# Patient Record
Sex: Female | Born: 1935 | Race: White | Hispanic: No | Marital: Married | Smoking: Never smoker
Health system: Southern US, Community
[De-identification: ages and names within clinical notes are randomized; demographics above are authoritative.]

## PROBLEM LIST (undated history)

## (undated) DIAGNOSIS — I2510 Atherosclerotic heart disease of native coronary artery without angina pectoris: Secondary | ICD-10-CM

## (undated) DIAGNOSIS — M1990 Unspecified osteoarthritis, unspecified site: Secondary | ICD-10-CM

## (undated) DIAGNOSIS — R7309 Other abnormal glucose: Secondary | ICD-10-CM

## (undated) DIAGNOSIS — I071 Rheumatic tricuspid insufficiency: Secondary | ICD-10-CM

## (undated) DIAGNOSIS — E785 Hyperlipidemia, unspecified: Secondary | ICD-10-CM

## (undated) DIAGNOSIS — E039 Hypothyroidism, unspecified: Secondary | ICD-10-CM

## (undated) DIAGNOSIS — I10 Essential (primary) hypertension: Secondary | ICD-10-CM

## (undated) DIAGNOSIS — D649 Anemia, unspecified: Secondary | ICD-10-CM

## (undated) DIAGNOSIS — I340 Nonrheumatic mitral (valve) insufficiency: Secondary | ICD-10-CM

## (undated) DIAGNOSIS — I509 Heart failure, unspecified: Secondary | ICD-10-CM

## (undated) DIAGNOSIS — I219 Acute myocardial infarction, unspecified: Secondary | ICD-10-CM

## (undated) DIAGNOSIS — I639 Cerebral infarction, unspecified: Secondary | ICD-10-CM

## (undated) DIAGNOSIS — N189 Chronic kidney disease, unspecified: Secondary | ICD-10-CM

## (undated) HISTORY — DX: Other abnormal glucose: R73.09

## (undated) HISTORY — PX: BACK SURGERY: SHX140

## (undated) HISTORY — DX: Hyperlipidemia, unspecified: E78.5

## (undated) HISTORY — PX: CORONARY STENT PLACEMENT: SHX1402

## (undated) HISTORY — DX: Rheumatic tricuspid insufficiency: I07.1

## (undated) HISTORY — PX: ABDOMINAL HYSTERECTOMY: SHX81

## (undated) HISTORY — DX: Hypothyroidism, unspecified: E03.9

## (undated) HISTORY — DX: Unspecified osteoarthritis, unspecified site: M19.90

## (undated) HISTORY — DX: Cerebral infarction, unspecified: I63.9

## (undated) HISTORY — DX: Chronic kidney disease, unspecified: N18.9

## (undated) HISTORY — DX: Anemia, unspecified: D64.9

## (undated) HISTORY — DX: Nonrheumatic mitral (valve) insufficiency: I34.0

## (undated) HISTORY — DX: Heart failure, unspecified: I50.9

## (undated) HISTORY — DX: Atherosclerotic heart disease of native coronary artery without angina pectoris: I25.10

## (undated) HISTORY — DX: Essential (primary) hypertension: I10

---

## 1997-08-31 ENCOUNTER — Other Ambulatory Visit: Admission: RE | Admit: 1997-08-31 | Discharge: 1997-08-31 | Payer: Self-pay | Admitting: Obstetrics and Gynecology

## 1998-12-03 ENCOUNTER — Other Ambulatory Visit: Admission: RE | Admit: 1998-12-03 | Discharge: 1998-12-03 | Payer: Self-pay | Admitting: Obstetrics and Gynecology

## 1999-07-29 ENCOUNTER — Ambulatory Visit (HOSPITAL_COMMUNITY): Admission: RE | Admit: 1999-07-29 | Discharge: 1999-07-29 | Payer: Self-pay | Admitting: Internal Medicine

## 1999-07-29 ENCOUNTER — Encounter: Payer: Self-pay | Admitting: Internal Medicine

## 1999-10-07 ENCOUNTER — Encounter: Payer: Self-pay | Admitting: Obstetrics and Gynecology

## 1999-10-07 ENCOUNTER — Encounter: Admission: RE | Admit: 1999-10-07 | Discharge: 1999-10-07 | Payer: Self-pay | Admitting: Obstetrics and Gynecology

## 1999-12-09 ENCOUNTER — Other Ambulatory Visit: Admission: RE | Admit: 1999-12-09 | Discharge: 1999-12-09 | Payer: Self-pay | Admitting: Obstetrics and Gynecology

## 2000-03-23 ENCOUNTER — Other Ambulatory Visit: Admission: RE | Admit: 2000-03-23 | Discharge: 2000-03-23 | Payer: Self-pay | Admitting: Gastroenterology

## 2000-11-05 ENCOUNTER — Encounter: Payer: Self-pay | Admitting: Obstetrics and Gynecology

## 2000-11-05 ENCOUNTER — Encounter: Admission: RE | Admit: 2000-11-05 | Discharge: 2000-11-05 | Payer: Self-pay | Admitting: Obstetrics and Gynecology

## 2000-12-14 ENCOUNTER — Other Ambulatory Visit: Admission: RE | Admit: 2000-12-14 | Discharge: 2000-12-14 | Payer: Self-pay | Admitting: Obstetrics and Gynecology

## 2001-11-11 ENCOUNTER — Encounter: Payer: Self-pay | Admitting: Internal Medicine

## 2001-11-11 ENCOUNTER — Encounter: Admission: RE | Admit: 2001-11-11 | Discharge: 2001-11-11 | Payer: Self-pay | Admitting: Internal Medicine

## 2001-11-20 ENCOUNTER — Encounter: Payer: Self-pay | Admitting: Neurological Surgery

## 2001-11-20 ENCOUNTER — Ambulatory Visit (HOSPITAL_COMMUNITY): Admission: RE | Admit: 2001-11-20 | Discharge: 2001-11-20 | Payer: Self-pay | Admitting: Neurological Surgery

## 2001-12-07 ENCOUNTER — Encounter: Payer: Self-pay | Admitting: Neurological Surgery

## 2001-12-09 ENCOUNTER — Encounter: Payer: Self-pay | Admitting: Neurological Surgery

## 2001-12-09 ENCOUNTER — Inpatient Hospital Stay (HOSPITAL_COMMUNITY): Admission: RE | Admit: 2001-12-09 | Discharge: 2001-12-14 | Payer: Self-pay | Admitting: Neurological Surgery

## 2002-12-12 ENCOUNTER — Encounter: Admission: RE | Admit: 2002-12-12 | Discharge: 2002-12-12 | Payer: Self-pay | Admitting: Internal Medicine

## 2002-12-12 ENCOUNTER — Encounter: Payer: Self-pay | Admitting: Internal Medicine

## 2003-12-13 ENCOUNTER — Encounter: Admission: RE | Admit: 2003-12-13 | Discharge: 2003-12-13 | Payer: Self-pay | Admitting: Internal Medicine

## 2004-01-29 ENCOUNTER — Ambulatory Visit: Payer: Self-pay | Admitting: Internal Medicine

## 2004-02-01 ENCOUNTER — Ambulatory Visit: Payer: Self-pay | Admitting: Internal Medicine

## 2004-03-05 ENCOUNTER — Inpatient Hospital Stay (HOSPITAL_COMMUNITY): Admission: RE | Admit: 2004-03-05 | Discharge: 2004-03-09 | Payer: Self-pay | Admitting: Internal Medicine

## 2004-03-05 ENCOUNTER — Ambulatory Visit: Payer: Self-pay | Admitting: Internal Medicine

## 2004-03-05 ENCOUNTER — Ambulatory Visit: Payer: Self-pay | Admitting: Cardiovascular Disease

## 2004-03-22 ENCOUNTER — Ambulatory Visit: Payer: Self-pay | Admitting: Cardiovascular Disease

## 2004-04-15 ENCOUNTER — Ambulatory Visit: Payer: Self-pay | Admitting: Internal Medicine

## 2004-04-17 ENCOUNTER — Ambulatory Visit: Payer: Self-pay | Admitting: Internal Medicine

## 2004-05-22 ENCOUNTER — Ambulatory Visit: Payer: Self-pay

## 2004-05-22 ENCOUNTER — Ambulatory Visit: Payer: Self-pay | Admitting: Cardiovascular Disease

## 2004-06-10 ENCOUNTER — Ambulatory Visit: Payer: Self-pay | Admitting: Internal Medicine

## 2004-06-17 ENCOUNTER — Ambulatory Visit: Payer: Self-pay | Admitting: Internal Medicine

## 2004-09-30 ENCOUNTER — Ambulatory Visit: Payer: Self-pay | Admitting: Internal Medicine

## 2004-10-03 ENCOUNTER — Ambulatory Visit: Payer: Self-pay | Admitting: Internal Medicine

## 2004-11-26 ENCOUNTER — Ambulatory Visit: Payer: Self-pay | Admitting: Cardiovascular Disease

## 2004-12-23 ENCOUNTER — Encounter: Admission: RE | Admit: 2004-12-23 | Discharge: 2004-12-23 | Payer: Self-pay | Admitting: Internal Medicine

## 2005-01-02 ENCOUNTER — Ambulatory Visit: Payer: Self-pay | Admitting: Internal Medicine

## 2005-01-24 ENCOUNTER — Ambulatory Visit: Payer: Self-pay | Admitting: Internal Medicine

## 2005-01-27 ENCOUNTER — Ambulatory Visit: Payer: Self-pay | Admitting: Internal Medicine

## 2005-04-24 ENCOUNTER — Ambulatory Visit: Payer: Self-pay | Admitting: Internal Medicine

## 2005-04-29 ENCOUNTER — Ambulatory Visit: Payer: Self-pay | Admitting: Internal Medicine

## 2005-05-28 ENCOUNTER — Ambulatory Visit: Payer: Self-pay | Admitting: Cardiovascular Disease

## 2005-07-21 ENCOUNTER — Ambulatory Visit: Payer: Self-pay | Admitting: Internal Medicine

## 2005-07-23 ENCOUNTER — Ambulatory Visit: Payer: Self-pay | Admitting: Internal Medicine

## 2005-10-31 ENCOUNTER — Ambulatory Visit: Payer: Self-pay | Admitting: Internal Medicine

## 2005-11-04 ENCOUNTER — Ambulatory Visit: Payer: Self-pay | Admitting: Internal Medicine

## 2005-11-18 ENCOUNTER — Ambulatory Visit: Payer: Self-pay | Admitting: Cardiovascular Disease

## 2005-12-02 ENCOUNTER — Ambulatory Visit: Payer: Self-pay

## 2005-12-30 ENCOUNTER — Ambulatory Visit: Payer: Self-pay | Admitting: Cardiovascular Disease

## 2006-01-15 ENCOUNTER — Encounter: Admission: RE | Admit: 2006-01-15 | Discharge: 2006-01-15 | Payer: Self-pay | Admitting: Internal Medicine

## 2006-01-27 ENCOUNTER — Encounter: Admission: RE | Admit: 2006-01-27 | Discharge: 2006-01-27 | Payer: Self-pay | Admitting: Internal Medicine

## 2006-01-30 ENCOUNTER — Ambulatory Visit: Payer: Self-pay | Admitting: Internal Medicine

## 2006-01-30 LAB — CONVERTED CEMR LAB
Creatinine, Ser: 1.6 mg/dL — ABNORMAL HIGH (ref 0.4–1.2)
Glucose, Bld: 100 mg/dL — ABNORMAL HIGH (ref 70–99)

## 2006-02-12 ENCOUNTER — Ambulatory Visit: Payer: Self-pay | Admitting: Internal Medicine

## 2006-03-09 ENCOUNTER — Ambulatory Visit: Payer: Self-pay | Admitting: Internal Medicine

## 2006-04-22 ENCOUNTER — Ambulatory Visit: Payer: Self-pay | Admitting: Internal Medicine

## 2006-05-11 ENCOUNTER — Ambulatory Visit: Payer: Self-pay | Admitting: Gastroenterology

## 2006-05-12 ENCOUNTER — Ambulatory Visit: Payer: Self-pay | Admitting: Internal Medicine

## 2006-05-12 LAB — CONVERTED CEMR LAB
AST: 26 units/L (ref 0–37)
BUN: 26 mg/dL — ABNORMAL HIGH (ref 6–23)
Cholesterol: 190 mg/dL (ref 0–200)
Hgb A1c MFr Bld: 6.1 % — ABNORMAL HIGH (ref 4.6–6.0)
Potassium: 4.2 meq/L (ref 3.5–5.1)
Sodium: 143 meq/L (ref 135–145)
Total CHOL/HDL Ratio: 3.9
VLDL: 45 mg/dL — ABNORMAL HIGH (ref 0–40)

## 2006-05-14 ENCOUNTER — Ambulatory Visit: Payer: Self-pay | Admitting: Internal Medicine

## 2006-05-27 ENCOUNTER — Ambulatory Visit: Payer: Self-pay | Admitting: Gastroenterology

## 2006-05-27 ENCOUNTER — Encounter: Payer: Self-pay | Admitting: Gastroenterology

## 2006-08-03 ENCOUNTER — Ambulatory Visit: Payer: Self-pay | Admitting: Cardiovascular Disease

## 2006-08-17 ENCOUNTER — Ambulatory Visit: Payer: Self-pay | Admitting: Internal Medicine

## 2006-08-17 LAB — CONVERTED CEMR LAB
BUN: 32 mg/dL — ABNORMAL HIGH (ref 6–23)
CO2: 26 meq/L (ref 19–32)
Calcium: 9.4 mg/dL (ref 8.4–10.5)
Chloride: 112 meq/L (ref 96–112)
GFR calc Af Amer: 51 mL/min
GFR calc non Af Amer: 42 mL/min
H Pylori IgG: NEGATIVE
Microalb Creat Ratio: 13 mg/g (ref 0.0–30.0)
Microalb, Ur: 1.3 mg/dL (ref 0.0–1.9)

## 2006-08-18 ENCOUNTER — Ambulatory Visit: Payer: Self-pay | Admitting: Internal Medicine

## 2006-08-18 LAB — CONVERTED CEMR LAB
Calcium, Total (PTH): 9.5 mg/dL (ref 8.4–10.5)
PTH: 79.8 pg/mL — ABNORMAL HIGH (ref 14.0–72.0)
Vit D, 1,25-Dihydroxy: 19 — ABNORMAL LOW (ref 20–57)

## 2006-12-02 ENCOUNTER — Ambulatory Visit: Payer: Self-pay

## 2006-12-21 ENCOUNTER — Ambulatory Visit: Payer: Self-pay | Admitting: Internal Medicine

## 2006-12-21 LAB — CONVERTED CEMR LAB
AST: 24 units/L (ref 0–37)
BUN: 25 mg/dL — ABNORMAL HIGH (ref 6–23)
Calcium: 9.4 mg/dL (ref 8.4–10.5)
Chloride: 113 meq/L — ABNORMAL HIGH (ref 96–112)
Direct LDL: 161.3 mg/dL
GFR calc Af Amer: 56 mL/min
Hgb A1c MFr Bld: 5.8 % (ref 4.6–6.0)
Total Bilirubin: 0.7 mg/dL (ref 0.3–1.2)
Total CHOL/HDL Ratio: 6.4
Total Protein: 6.6 g/dL (ref 6.0–8.3)
Triglycerides: 259 mg/dL (ref 0–149)
VLDL: 52 mg/dL — ABNORMAL HIGH (ref 0–40)

## 2006-12-23 ENCOUNTER — Ambulatory Visit: Payer: Self-pay | Admitting: Internal Medicine

## 2006-12-23 DIAGNOSIS — E039 Hypothyroidism, unspecified: Secondary | ICD-10-CM

## 2006-12-28 ENCOUNTER — Encounter: Payer: Self-pay | Admitting: Internal Medicine

## 2006-12-28 DIAGNOSIS — M1990 Unspecified osteoarthritis, unspecified site: Secondary | ICD-10-CM | POA: Insufficient documentation

## 2006-12-28 DIAGNOSIS — E785 Hyperlipidemia, unspecified: Secondary | ICD-10-CM

## 2006-12-28 DIAGNOSIS — I10 Essential (primary) hypertension: Secondary | ICD-10-CM

## 2006-12-28 DIAGNOSIS — I2510 Atherosclerotic heart disease of native coronary artery without angina pectoris: Secondary | ICD-10-CM | POA: Insufficient documentation

## 2007-01-18 ENCOUNTER — Encounter: Admission: RE | Admit: 2007-01-18 | Discharge: 2007-01-18 | Payer: Self-pay | Admitting: Internal Medicine

## 2007-01-25 ENCOUNTER — Telehealth: Payer: Self-pay | Admitting: Internal Medicine

## 2007-02-03 ENCOUNTER — Ambulatory Visit: Payer: Self-pay | Admitting: Cardiovascular Disease

## 2007-02-24 ENCOUNTER — Ambulatory Visit: Payer: Self-pay

## 2007-04-01 ENCOUNTER — Ambulatory Visit: Payer: Self-pay | Admitting: Cardiovascular Disease

## 2007-04-12 ENCOUNTER — Ambulatory Visit: Payer: Self-pay | Admitting: Internal Medicine

## 2007-04-12 LAB — CONVERTED CEMR LAB
ALT: 16 units/L (ref 0–35)
Albumin: 3.5 g/dL (ref 3.5–5.2)
Alkaline Phosphatase: 79 units/L (ref 39–117)
BUN: 25 mg/dL — ABNORMAL HIGH (ref 6–23)
CO2: 25 meq/L (ref 19–32)
Calcium: 9.3 mg/dL (ref 8.4–10.5)
Creatinine, Ser: 1.2 mg/dL (ref 0.4–1.2)
GFR calc Af Amer: 56 mL/min
Total Bilirubin: 0.8 mg/dL (ref 0.3–1.2)
Total Protein: 6.8 g/dL (ref 6.0–8.3)
VLDL: 30 mg/dL (ref 0–40)

## 2007-04-14 ENCOUNTER — Ambulatory Visit: Payer: Self-pay | Admitting: Internal Medicine

## 2007-05-26 ENCOUNTER — Ambulatory Visit: Payer: Self-pay | Admitting: Internal Medicine

## 2007-05-26 DIAGNOSIS — R05 Cough: Secondary | ICD-10-CM

## 2007-06-08 ENCOUNTER — Ambulatory Visit: Payer: Self-pay | Admitting: Internal Medicine

## 2007-06-08 DIAGNOSIS — R252 Cramp and spasm: Secondary | ICD-10-CM

## 2007-08-09 ENCOUNTER — Ambulatory Visit: Payer: Self-pay | Admitting: Internal Medicine

## 2007-08-09 LAB — CONVERTED CEMR LAB
BUN: 25 mg/dL — ABNORMAL HIGH (ref 6–23)
CO2: 25 meq/L (ref 19–32)
Calcium: 9.3 mg/dL (ref 8.4–10.5)
Chloride: 107 meq/L (ref 96–112)
Cholesterol: 280 mg/dL (ref 0–200)
Creatinine, Ser: 1.2 mg/dL (ref 0.4–1.2)
Direct LDL: 178.4 mg/dL
GFR calc Af Amer: 56 mL/min
GFR calc non Af Amer: 46 mL/min
Glucose, Bld: 87 mg/dL (ref 70–99)
HDL: 37.2 mg/dL — ABNORMAL LOW (ref >39.0)
Potassium: 4 meq/L (ref 3.5–5.1)
Sodium: 141 meq/L (ref 135–145)
TSH: 1.16 microintl units/mL (ref 0.35–5.50)
Total CHOL/HDL Ratio: 7.5
Triglycerides: 322 mg/dL (ref 0–149)
VLDL: 64 mg/dL — ABNORMAL HIGH (ref 0–40)

## 2007-08-11 ENCOUNTER — Ambulatory Visit: Payer: Self-pay | Admitting: Internal Medicine

## 2007-08-11 ENCOUNTER — Telehealth: Payer: Self-pay | Admitting: Internal Medicine

## 2007-08-11 DIAGNOSIS — E559 Vitamin D deficiency, unspecified: Secondary | ICD-10-CM | POA: Insufficient documentation

## 2007-12-09 ENCOUNTER — Ambulatory Visit: Payer: Self-pay | Admitting: Internal Medicine

## 2007-12-09 LAB — CONVERTED CEMR LAB
CO2: 25 meq/L (ref 19–32)
Chloride: 106 meq/L (ref 96–112)
Direct LDL: 176.7 mg/dL
Glucose, Bld: 92 mg/dL (ref 70–99)
Potassium: 4.2 meq/L (ref 3.5–5.1)
Sodium: 139 meq/L (ref 135–145)
Total CHOL/HDL Ratio: 7.8
Triglycerides: 332 mg/dL (ref 0–149)
Vit D, 1,25-Dihydroxy: 37 (ref 30–89)

## 2007-12-13 ENCOUNTER — Ambulatory Visit: Payer: Self-pay | Admitting: Internal Medicine

## 2007-12-13 DIAGNOSIS — N259 Disorder resulting from impaired renal tubular function, unspecified: Secondary | ICD-10-CM | POA: Insufficient documentation

## 2008-01-19 ENCOUNTER — Encounter: Admission: RE | Admit: 2008-01-19 | Discharge: 2008-01-19 | Payer: Self-pay | Admitting: Internal Medicine

## 2008-03-20 ENCOUNTER — Telehealth: Payer: Self-pay | Admitting: Internal Medicine

## 2008-03-20 ENCOUNTER — Ambulatory Visit: Payer: Self-pay | Admitting: Internal Medicine

## 2008-03-21 LAB — CONVERTED CEMR LAB
BUN: 38 mg/dL — ABNORMAL HIGH (ref 6–23)
Cholesterol: 322 mg/dL (ref 0–200)
Creatinine, Ser: 1.5 mg/dL — ABNORMAL HIGH (ref 0.4–1.2)
GFR calc Af Amer: 43 mL/min
GFR calc non Af Amer: 36 mL/min
Glucose, Bld: 95 mg/dL (ref 70–99)
HDL: 37.1 mg/dL — ABNORMAL LOW (ref >39.0)
Potassium: 4.7 meq/L (ref 3.5–5.1)
Triglycerides: 278 mg/dL (ref 0–149)
VLDL: 56 mg/dL — ABNORMAL HIGH (ref 0–40)

## 2008-03-22 ENCOUNTER — Ambulatory Visit: Payer: Self-pay | Admitting: Internal Medicine

## 2008-05-22 ENCOUNTER — Ambulatory Visit: Payer: Self-pay | Admitting: Internal Medicine

## 2008-05-22 ENCOUNTER — Encounter: Payer: Self-pay | Admitting: Internal Medicine

## 2008-06-13 ENCOUNTER — Encounter: Payer: Self-pay | Admitting: Cardiovascular Disease

## 2008-06-13 ENCOUNTER — Ambulatory Visit: Payer: Self-pay | Admitting: Cardiovascular Disease

## 2008-06-23 ENCOUNTER — Ambulatory Visit: Payer: Self-pay | Admitting: Internal Medicine

## 2008-06-23 LAB — CONVERTED CEMR LAB
AST: 26 units/L (ref 0–37)
Alkaline Phosphatase: 77 units/L (ref 39–117)
BUN: 38 mg/dL — ABNORMAL HIGH (ref 6–23)
Bilirubin, Direct: 0.1 mg/dL (ref 0.0–0.3)
Creatinine, Ser: 1.8 mg/dL — ABNORMAL HIGH (ref 0.4–1.2)
GFR calc non Af Amer: 28.91 mL/min (ref >60)
Glucose, Bld: 88 mg/dL (ref 70–99)
Potassium: 4.8 meq/L (ref 3.5–5.1)
Total CHOL/HDL Ratio: 6
Total Protein: 6.5 g/dL (ref 6.0–8.3)
VLDL: 41.8 mg/dL — ABNORMAL HIGH (ref 0.0–40.0)

## 2008-06-27 ENCOUNTER — Ambulatory Visit: Payer: Self-pay | Admitting: Internal Medicine

## 2008-07-03 ENCOUNTER — Encounter: Admission: RE | Admit: 2008-07-03 | Discharge: 2008-07-03 | Payer: Self-pay | Admitting: Internal Medicine

## 2008-10-20 ENCOUNTER — Ambulatory Visit: Payer: Self-pay | Admitting: Internal Medicine

## 2008-10-20 LAB — CONVERTED CEMR LAB
BUN: 35 mg/dL — ABNORMAL HIGH (ref 6–23)
CO2: 27 meq/L (ref 19–32)
Glucose, Bld: 89 mg/dL (ref 70–99)
Potassium: 4.8 meq/L (ref 3.5–5.1)
Sodium: 143 meq/L (ref 135–145)

## 2008-10-23 ENCOUNTER — Ambulatory Visit: Payer: Self-pay | Admitting: Internal Medicine

## 2008-12-19 ENCOUNTER — Ambulatory Visit: Payer: Self-pay | Admitting: Cardiovascular Disease

## 2009-02-01 ENCOUNTER — Ambulatory Visit: Payer: Self-pay | Admitting: Internal Medicine

## 2009-02-01 LAB — CONVERTED CEMR LAB
BUN: 26 mg/dL — ABNORMAL HIGH (ref 6–23)
CO2: 26 meq/L (ref 19–32)
Calcium: 9.3 mg/dL (ref 8.4–10.5)
Chloride: 109 meq/L (ref 96–112)
Creatinine, Ser: 1.6 mg/dL — ABNORMAL HIGH (ref 0.4–1.2)

## 2009-02-02 ENCOUNTER — Encounter: Payer: Self-pay | Admitting: Internal Medicine

## 2009-02-05 ENCOUNTER — Ambulatory Visit: Payer: Self-pay | Admitting: Internal Medicine

## 2009-02-05 DIAGNOSIS — L600 Ingrowing nail: Secondary | ICD-10-CM | POA: Insufficient documentation

## 2009-02-13 ENCOUNTER — Encounter: Payer: Self-pay | Admitting: Internal Medicine

## 2009-05-03 ENCOUNTER — Encounter (INDEPENDENT_AMBULATORY_CARE_PROVIDER_SITE_OTHER): Payer: Self-pay | Admitting: *Deleted

## 2009-05-29 ENCOUNTER — Ambulatory Visit: Payer: Self-pay | Admitting: Cardiovascular Disease

## 2009-06-01 ENCOUNTER — Ambulatory Visit: Payer: Self-pay | Admitting: Internal Medicine

## 2009-06-01 LAB — CONVERTED CEMR LAB
Albumin: 3.8 g/dL (ref 3.5–5.2)
BUN: 36 mg/dL — ABNORMAL HIGH (ref 6–23)
Calcium: 9.5 mg/dL (ref 8.4–10.5)
Cholesterol: 205 mg/dL — ABNORMAL HIGH (ref 0–200)
Creatinine, Ser: 1.7 mg/dL — ABNORMAL HIGH (ref 0.4–1.2)
GFR calc non Af Amer: 30.8 mL/min (ref >60)
Glucose, Bld: 91 mg/dL (ref 70–99)
HDL: 46.7 mg/dL (ref >39.00)
Triglycerides: 275 mg/dL — ABNORMAL HIGH (ref 0.0–149.0)
VLDL: 55 mg/dL — ABNORMAL HIGH (ref 0.0–40.0)

## 2009-06-05 ENCOUNTER — Ambulatory Visit: Payer: Self-pay | Admitting: Internal Medicine

## 2009-06-05 DIAGNOSIS — R55 Syncope and collapse: Secondary | ICD-10-CM

## 2009-06-05 DIAGNOSIS — K5289 Other specified noninfective gastroenteritis and colitis: Secondary | ICD-10-CM

## 2009-08-19 ENCOUNTER — Encounter: Payer: Self-pay | Admitting: Emergency Medicine

## 2009-08-19 ENCOUNTER — Ambulatory Visit: Payer: Self-pay | Admitting: Internal Medicine

## 2009-08-19 ENCOUNTER — Inpatient Hospital Stay (HOSPITAL_COMMUNITY): Admission: EM | Admit: 2009-08-19 | Discharge: 2009-08-25 | Payer: Self-pay | Admitting: Internal Medicine

## 2009-08-23 ENCOUNTER — Encounter: Payer: Self-pay | Admitting: Cardiology

## 2009-08-27 ENCOUNTER — Telehealth: Payer: Self-pay | Admitting: Cardiovascular Disease

## 2009-08-29 ENCOUNTER — Ambulatory Visit: Payer: Self-pay | Admitting: Cardiology

## 2009-08-30 ENCOUNTER — Encounter: Payer: Self-pay | Admitting: Cardiovascular Disease

## 2009-08-31 ENCOUNTER — Encounter: Payer: Self-pay | Admitting: Cardiovascular Disease

## 2009-08-31 ENCOUNTER — Encounter: Payer: Self-pay | Admitting: Cardiology

## 2009-09-04 ENCOUNTER — Ambulatory Visit: Payer: Self-pay | Admitting: Cardiology

## 2009-09-05 ENCOUNTER — Ambulatory Visit: Payer: Self-pay | Admitting: Cardiology

## 2009-09-05 ENCOUNTER — Telehealth: Payer: Self-pay | Admitting: Nurse Practitioner

## 2009-09-06 LAB — CONVERTED CEMR LAB
BUN: 67 mg/dL — ABNORMAL HIGH (ref 6–23)
BUN: 50 mg/dL — ABNORMAL HIGH (ref 6–23)
Basophils Absolute: 0.1 10*3/uL (ref 0.0–0.1)
Calcium: 9.8 mg/dL (ref 8.4–10.5)
Creatinine, Ser: 2.3 mg/dL — ABNORMAL HIGH (ref 0.4–1.2)
Creatinine, Ser: 1.8 mg/dL — ABNORMAL HIGH (ref 0.4–1.2)
GFR calc non Af Amer: 21.61 mL/min (ref >60)
GFR calc non Af Amer: 29.01 mL/min (ref >60)
Glucose, Bld: 94 mg/dL (ref 70–99)
HCT: 39 % (ref 36.0–46.0)
Lymphs Abs: 1.7 10*3/uL (ref 0.7–4.0)
Monocytes Absolute: 1 10*3/uL (ref 0.1–1.0)
Monocytes Relative: 10.2 % (ref 3.0–12.0)
Platelets: 336 10*3/uL (ref 150.0–400.0)
Potassium: 5.1 meq/L (ref 3.5–5.1)
Potassium: 4.9 meq/L (ref 3.5–5.1)
Prothrombin Time: 11.6 s (ref 9.7–11.8)
RDW: 12.9 % (ref 11.5–14.6)

## 2009-09-07 ENCOUNTER — Ambulatory Visit: Payer: Self-pay | Admitting: Cardiology

## 2009-09-10 LAB — CONVERTED CEMR LAB
Calcium: 9.4 mg/dL (ref 8.4–10.5)
GFR calc non Af Amer: 34.76 mL/min (ref >60)
Sodium: 139 meq/L (ref 135–145)

## 2009-09-12 ENCOUNTER — Telehealth: Payer: Self-pay | Admitting: Cardiology

## 2009-09-24 ENCOUNTER — Ambulatory Visit: Payer: Self-pay | Admitting: Cardiology

## 2009-09-25 LAB — CONVERTED CEMR LAB
Basophils Relative: 0.8 % (ref 0.0–3.0)
Chloride: 105 meq/L (ref 96–112)
Eosinophils Relative: 4 % (ref 0.0–5.0)
HCT: 33.1 % — ABNORMAL LOW (ref 36.0–46.0)
INR: 1 (ref 0.8–1.0)
Lymphs Abs: 1.7 10*3/uL (ref 0.7–4.0)
MCV: 89.8 fL (ref 78.0–100.0)
Monocytes Absolute: 0.6 10*3/uL (ref 0.1–1.0)
Potassium: 5.4 meq/L — ABNORMAL HIGH (ref 3.5–5.1)
RBC: 3.69 M/uL — ABNORMAL LOW (ref 3.87–5.11)
Sodium: 139 meq/L (ref 135–145)
WBC: 5.7 10*3/uL (ref 4.5–10.5)

## 2009-09-28 ENCOUNTER — Ambulatory Visit: Payer: Self-pay | Admitting: Cardiology

## 2009-09-28 ENCOUNTER — Inpatient Hospital Stay (HOSPITAL_COMMUNITY): Admission: RE | Admit: 2009-09-28 | Discharge: 2009-09-29 | Payer: Self-pay | Admitting: Cardiology

## 2009-09-29 ENCOUNTER — Encounter: Payer: Self-pay | Admitting: Cardiology

## 2009-09-29 ENCOUNTER — Ambulatory Visit: Payer: Self-pay | Admitting: Vascular Surgery

## 2009-10-01 ENCOUNTER — Ambulatory Visit: Payer: Self-pay | Admitting: Cardiovascular Disease

## 2009-10-03 LAB — CONVERTED CEMR LAB
BUN: 22 mg/dL (ref 6–23)
CO2: 28 meq/L (ref 19–32)
Chloride: 108 meq/L (ref 96–112)
Creatinine, Ser: 1.4 mg/dL — ABNORMAL HIGH (ref 0.4–1.2)
Potassium: 4.2 meq/L (ref 3.5–5.1)

## 2009-10-08 ENCOUNTER — Ambulatory Visit: Payer: Self-pay | Admitting: Internal Medicine

## 2009-10-08 LAB — CONVERTED CEMR LAB
BUN: 23 mg/dL (ref 6–23)
CO2: 28 meq/L (ref 19–32)
GFR calc non Af Amer: 41.58 mL/min (ref >60)
Glucose, Bld: 91 mg/dL (ref 70–99)
Potassium: 4.4 meq/L (ref 3.5–5.1)

## 2009-10-10 ENCOUNTER — Ambulatory Visit: Payer: Self-pay | Admitting: Internal Medicine

## 2009-10-30 ENCOUNTER — Ambulatory Visit: Payer: Self-pay | Admitting: Cardiovascular Disease

## 2009-11-12 ENCOUNTER — Encounter: Payer: Self-pay | Admitting: Cardiology

## 2009-11-12 ENCOUNTER — Ambulatory Visit: Payer: Self-pay | Admitting: Cardiology

## 2009-11-15 ENCOUNTER — Ambulatory Visit: Payer: Self-pay | Admitting: Cardiology

## 2009-11-15 LAB — CONVERTED CEMR LAB
Basophils Absolute: 0 10*3/uL (ref 0.0–0.1)
Calcium: 9.6 mg/dL (ref 8.4–10.5)
Creatinine, Ser: 1.5 mg/dL — ABNORMAL HIGH (ref 0.4–1.2)
Eosinophils Absolute: 0.2 10*3/uL (ref 0.0–0.7)
GFR calc non Af Amer: 36.97 mL/min (ref >60)
HCT: 34.8 % — ABNORMAL LOW (ref 36.0–46.0)
INR: 1 (ref 0.8–1.0)
Lymphs Abs: 1.4 10*3/uL (ref 0.7–4.0)
MCHC: 33.6 g/dL (ref 30.0–36.0)
MCV: 89.5 fL (ref 78.0–100.0)
Monocytes Absolute: 0.6 10*3/uL (ref 0.1–1.0)
Monocytes Relative: 10.7 % (ref 3.0–12.0)
Neutro Abs: 3.1 10*3/uL (ref 1.4–7.7)
Platelets: 188 10*3/uL (ref 150.0–400.0)
Prothrombin Time: 10.8 s (ref 9.7–11.8)
RDW: 13.5 % (ref 11.5–14.6)
Sodium: 143 meq/L (ref 135–145)
aPTT: 28.2 s (ref 21.7–28.8)

## 2009-11-21 ENCOUNTER — Ambulatory Visit: Payer: Self-pay | Admitting: Cardiology

## 2009-11-21 ENCOUNTER — Inpatient Hospital Stay (HOSPITAL_COMMUNITY): Admission: RE | Admit: 2009-11-21 | Discharge: 2009-11-22 | Payer: Self-pay | Admitting: Cardiology

## 2009-11-22 ENCOUNTER — Encounter: Payer: Self-pay | Admitting: Cardiovascular Disease

## 2009-11-28 ENCOUNTER — Ambulatory Visit: Payer: Self-pay | Admitting: Cardiology

## 2009-11-30 ENCOUNTER — Telehealth: Payer: Self-pay | Admitting: Cardiology

## 2009-11-30 LAB — CONVERTED CEMR LAB
BUN: 27 mg/dL — ABNORMAL HIGH (ref 6–23)
Calcium: 9.4 mg/dL (ref 8.4–10.5)
Creatinine, Ser: 1.4 mg/dL — ABNORMAL HIGH (ref 0.4–1.2)

## 2009-12-12 ENCOUNTER — Ambulatory Visit: Payer: Self-pay | Admitting: Cardiovascular Disease

## 2010-01-11 ENCOUNTER — Ambulatory Visit: Payer: Self-pay | Admitting: Internal Medicine

## 2010-01-11 LAB — CONVERTED CEMR LAB
ALT: 11 units/L (ref 0–35)
AST: 24 units/L (ref 0–37)
Bilirubin, Direct: 0.1 mg/dL (ref 0.0–0.3)
Calcium: 9.7 mg/dL (ref 8.4–10.5)
Cholesterol: 187 mg/dL (ref 0–200)
GFR calc non Af Amer: 36.09 mL/min (ref >60)
HDL: 51.6 mg/dL (ref >39.00)
Potassium: 4.7 meq/L (ref 3.5–5.1)
Sodium: 140 meq/L (ref 135–145)
Total Bilirubin: 0.7 mg/dL (ref 0.3–1.2)
Total Protein: 6.6 g/dL (ref 6.0–8.3)
VLDL: 34.6 mg/dL (ref 0.0–40.0)

## 2010-01-15 ENCOUNTER — Ambulatory Visit: Payer: Self-pay | Admitting: Internal Medicine

## 2010-02-14 ENCOUNTER — Telehealth: Payer: Self-pay | Admitting: Cardiovascular Disease

## 2010-02-18 ENCOUNTER — Telehealth: Payer: Self-pay | Admitting: Internal Medicine

## 2010-03-14 ENCOUNTER — Ambulatory Visit
Admission: RE | Admit: 2010-03-14 | Discharge: 2010-03-14 | Payer: Self-pay | Source: Home / Self Care | Attending: Cardiovascular Disease | Admitting: Cardiovascular Disease

## 2010-03-14 DIAGNOSIS — N6019 Diffuse cystic mastopathy of unspecified breast: Secondary | ICD-10-CM | POA: Insufficient documentation

## 2010-03-15 ENCOUNTER — Telehealth: Payer: Self-pay | Admitting: Internal Medicine

## 2010-03-24 ENCOUNTER — Encounter: Payer: Self-pay | Admitting: Internal Medicine

## 2010-04-02 ENCOUNTER — Telehealth: Payer: Self-pay | Admitting: Internal Medicine

## 2010-04-02 NOTE — Miscellaneous (Signed)
Summary: [facility] Progress [facility]   [facility]   Imported By: Roderic Ovens 10/08/2009 16:02:46  _____________________________________________________________________  External Attachment:    Type:   Image     Comment:   External Document

## 2010-04-02 NOTE — Letter (Signed)
Summary: Cardiac Catheterization Instructions- Main Lab  [facility], Main Office  [address]   [place], [place] [address]   Phone: [phone]  Fax: [phone]     08/29/2009 MRN: [id number]  [facility] [address] [place], [place]  [address]  Dear Ms. Mohabir,   You are scheduled for Cardiac Catheterization on Thursday September 06, 2009              with Dr. Riley Kill.  Please arrive at the [facility] at 9:00      a.m. on the day of your procedure.  1. DIET     _X___ Nothing to eat or drink after midnight except your medications with a sip of water.  2. Come to the [place] office on Tuesday September 04, 2009 for lab work.  The lab at [facility] is open from 8:30 a.m. to 1:30 p.m. and 2:30 p.m. to 5:00 p.m.  The lab at [facility] is open from 7:30 a.m. to 5:30 p.m.  You do not have to be fasting.  3. MAKE SURE YOU TAKE YOUR ASPIRIN AND PLAVIX.  4. _X____ DO NOT TAKE these medications before your procedure:     Do not take Furosemide the morning of procedure.          _X_ YOU MAY TAKE ALL of your remaining medications with a small amount of water.       5. Plan for one night stay - bring personal belongings (i.e. toothpaste, toothbrush, etc.)  6. Bring a current list of your medications and current insurance cards.  7. Must have a responsible person to drive you home.   8. Someone must be with you for the first 24 hours after you arrive home.  9. Please wear clothes that are easy to get on and off and wear slip-on shoes.  *Special note: Every effort is made to have your procedure done on time.  Occasionally there are emergencies that present themselves at the hospital that may cause delays.  Please be patient if a delay does occur.  If you have any questions after you get home, please call the office at the number listed above.  Julieta Gutting, RN, BSN

## 2010-04-02 NOTE — Assessment & Plan Note (Signed)
Summary: 4 mo rov /nws   Vital Signs:  Patient profile:   77 year old female Height:      67 inches Weight:      152.50 pounds BMI:     23.97 O2 Sat:      97 % on Room air Temp:     97.3 degrees F oral Pulse rate:   58 / minute BP sitting:   110 / 64  (left arm) Cuff size:   large  Vitals Entered By: Lucious Groves (June 05, 2009 9:09 AM)  O2 Flow:  Room air CC: 4 mo. rtn ov--No symptoms or complaints, pt states doing about the same./kb Is Patient Diabetic? No Pain Assessment Patient in pain? no      Comments Patient also needs refill of Meclizine./kb   CC:  4 mo. rtn ov--No symptoms or complaints and pt states doing about the same./kb.  History of Present Illness: The patient presents for a follow up of hypertension, CAD, hyperlipidemia On March 1st wknd ate at [employer] - got diarrhea and vomiting 2 hrs later, felt better She passed out right after on Mon evening (March)  Current Medications (verified): 1)  Metoprolol Tartrate 100 Mg Tabs (Metoprolol Tartrate) .... 1/2 Tab By Mouth Two Times A Day 2)  Plavix 75 Mg Tabs (Clopidogrel Bisulfate) .Marland Kitchen.. 1 Qd 3)  Levothroid 75 Mcg Tabs (Levothyroxine Sodium) .... Qd 4)  Methyldopa 250 Mg  Tabs (Methyldopa) .Marland Kitchen.. 1 By Mouth Bid 5)  Meclizine Hcl 25 Mg  Tabs (Meclizine Hcl) .Marland Kitchen.. 1 Three Times A Day 6)  Prilosec Otc 20 Mg Tbec (Omeprazole Magnesium) .Marland Kitchen.. 1 By Mouth Qd 7)  Benazepril-Hydrochlorothiazide 20-25 Mg Tabs (Benazepril-Hydrochlorothiazide) .... Take 1 Tablet By Mouth Once A Day 8)  Vitamin D3 1000 Unit  Tabs (Cholecalciferol) .... 2 By Mouth Daily 9)  Fish Oil   Oil (Fish Oil) .Marland Kitchen.. 1 By Mouth Bid 10)  Pravastatin Sodium 20 Mg  Tabs (Pravastatin Sodium) .... One By Mouth At Bedtime 11)  Aspirin 81 Mg Tbec (Aspirin) .... Take One Tablet By Mouth Daily 12)  Coq10 100 Mg Caps (Coenzyme Q10) .... Once Daily  Allergies (verified): 1)  ! Pcn 2)  Vytorin 3)  Crestor  Past History:  Past Medical History: Last updated:  12/13/2007 Coronary artery disease Hyperlipidemia Hypertension (nl BP at home) Osteoarthritis Elev glu H/o CVA Vit D def Renal insufficiency  Family History: Last updated: 04/14/2007 Family History Hypertension  Social History: Retired Married 60 years 2011 Never Smoked Non-drinker  Review of Systems  The patient denies chest pain.    Physical Exam  General:  Affect appropriate Healthy:  appears stated age HEENT: normal Neck supple with no adenopathy JVP normal no bruits no thyromegaly Lungs clear with no wheezing and good diaphragmatic motion Heart:  S1/S2 no murmur,rub, gallop or click PMI normal Abdomen: benighn, BS positve, no tenderness, no AAA no bruit.  No HSM or HJR Distal pulses intact with no bruits No edema Neuro non-focal Skin warm and dry  Ears:  External ear exam shows no significant lesions or deformities.  Otoscopic examination reveals clear canals, tympanic membranes are intact bilaterally without bulging, retraction, inflammation or discharge. Hearing is grossly normal bilaterally. Nose:  External nasal examination shows no deformity or inflammation. Nasal mucosa are pink and moist without lesions or exudates. Mouth:  Oral mucosa and oropharynx without lesions or exudates.  Teeth in good repair. Lungs:  Normal respiratory effort, chest expands symmetrically. Lungs are clear to auscultation,  no crackles or wheezes. Heart:  Normal rate and regular rhythm. S1 and S2 normal without gallop, murmur, click, rub or other extra sounds. Abdomen:  Bowel sounds positive,abdomen soft and non-tender without masses, organomegaly or hernias noted. Msk:  No deformity or scoliosis noted of thoracic or lumbar spine.   Neurologic:  No cranial nerve deficits noted. Station and gait are normal. Plantar reflexes are down-going bilaterally. DTRs are symmetrical throughout. Sensory, motor and coordinative functions appear intact. Skin:  Intact without suspicious lesions or  rashes Moles on back R big toenail deformed and thick Psych:  Cognition and judgment appear intact. Alert and cooperative with normal attention span and concentration. No apparent delusions, illusions, hallucinations   Impression & Recommendations:  Problem # 1:  GASTROENTERITIS (ICD-558.9) 1 month ago Assessment Comment Only  Her updated medication list for this problem includes:    Meclizine Hcl 25 Mg Tabs (Meclizine hcl) .Marland Kitchen... 1 three times a day  Problem # 2:  SYNCOPE (ICD-780.2) vaso-vagal  due to #1 Assessment: Comment Only  Problem # 3:  RENAL INSUFFICIENCY (ICD-588.9) Assessment: Unchanged The labs were reviewed with the patient.   Problem # 4:  HYPERLIPIDEMIA (ICD-272.4) Assessment: Improved  Her updated medication list for this problem includes:    Pravastatin Sodium 20 Mg Tabs (Pravastatin sodium) ..... One by mouth at bedtime - the best we can do  Problem # 5:  CORONARY ARTERY DISEASE (ICD-414.00) Assessment: Unchanged  Her updated medication list for this problem includes:    Metoprolol Tartrate 100 Mg Tabs (Metoprolol tartrate) .Marland Kitchen... 1/2 tab by mouth two times a day    Plavix 75 Mg Tabs (Clopidogrel bisulfate) .Marland Kitchen... 1 qd    Methyldopa 250 Mg Tabs (Methyldopa) .Marland Kitchen... 1 by mouth bid    Benazepril-hydrochlorothiazide 20-25 Mg Tabs (Benazepril-hydrochlorothiazide) .Marland Kitchen... Take 1 tablet by mouth once a day    Aspirin 81 Mg Tbec (Aspirin) .Marland Kitchen... Take one tablet by mouth daily  Problem # 6:  HYPERTENSION (ICD-401.9) Assessment: Unchanged  Her updated medication list for this problem includes:    Metoprolol Tartrate 100 Mg Tabs (Metoprolol tartrate) .Marland Kitchen... 1/2 tab by mouth two times a day    Methyldopa 250 Mg Tabs (Methyldopa) .Marland Kitchen... 1 by mouth bid    Benazepril-hydrochlorothiazide 20-25 Mg Tabs (Benazepril-hydrochlorothiazide) .Marland Kitchen... Take 1 tablet by mouth once a day  Complete Medication List: 1)  Metoprolol Tartrate 100 Mg Tabs (Metoprolol tartrate) .... 1/2 tab by  mouth two times a day 2)  Plavix 75 Mg Tabs (Clopidogrel bisulfate) .Marland Kitchen.. 1 qd 3)  Levothroid 75 Mcg Tabs (Levothyroxine sodium) .... Qd 4)  Methyldopa 250 Mg Tabs (Methyldopa) .Marland Kitchen.. 1 by mouth bid 5)  Meclizine Hcl 25 Mg Tabs (Meclizine hcl) .Marland Kitchen.. 1 three times a day 6)  Prilosec Otc 20 Mg Tbec (Omeprazole magnesium) .Marland Kitchen.. 1 by mouth qd 7)  Benazepril-hydrochlorothiazide 20-25 Mg Tabs (Benazepril-hydrochlorothiazide) .... Take 1 tablet by mouth once a day 8)  Vitamin D3 1000 Unit Tabs (Cholecalciferol) .... 2 by mouth daily 9)  Fish Oil Oil (Fish oil) .Marland Kitchen.. 1 by mouth bid 10)  Pravastatin Sodium 20 Mg Tabs (Pravastatin sodium) .... One by mouth at bedtime 11)  Aspirin 81 Mg Tbec (Aspirin) .... Take one tablet by mouth daily 12)  Coq10 100 Mg Caps (Coenzyme q10) .... Once daily  Other Orders: Prescription Created Electronically [phone])  Patient Instructions: 1)  Please schedule a follow-up appointment in 3 months. 2)  BMP prior to visit, ICD-9: 401.1 Prescriptions: MECLIZINE HCL 25 MG  TABS (MECLIZINE  HCL) 1 three times a day  #270 x 3   Entered and Authorized by:   Tresa Garter MD   Signed by:   Tresa Garter MD on 06/05/2009   Method used:   Electronically to        [facility]* (mail-order)             ,          Ph: [phone]       Fax: [phone]   RxID:   [id number] METOPROLOL TARTRATE 100 MG TABS (METOPROLOL TARTRATE) 1/2 tab by mouth two times a day  #90 x 3   Entered and Authorized by:   Tresa Garter MD   Signed by:   Tresa Garter MD on 06/05/2009   Method used:   Electronically to        [facility]* (mail-order)             ,          Ph: [phone]       Fax: [phone]   RxID:   [id number]

## 2010-04-02 NOTE — Letter (Signed)
Summary: [facility]: Physician Documentation Sheet  [facility]: Physician Documentation Sheet   Imported By: Earl Many 12/11/2009 16:31:20  _____________________________________________________________________  External Attachment:    Type:   Image     Comment:   External Document

## 2010-04-02 NOTE — Assessment & Plan Note (Signed)
Summary: eph/jml   Visit Type:  Follow-up Primary Provider:  Dr. Avel Sensor  CC:  no complaints.  History of Present Illness: Breck is seen today post LAD intervention.  She has had recent circ infarct with restenosis and stenting with staged intervention to LAD.  EF is normal.  No angina and compliant with antiplatlet drugs. Leg has healed well.  Ambulatorywith no dyspnea, palpitations edema or syncope.  CRF's well modified and on statin  Current Problems (verified): 1)  Coronary Atherosclerosis Native Coronary Artery  (ICD-414.01) 2)  Syncope  (ICD-780.2) 3)  Gastroenteritis  (ICD-558.9) 4)  Ingrown Toenail  (ICD-703.0) 5)  Renal Insufficiency  (ICD-588.9) 6)  Vitamin D Deficiency  (ICD-268.9) 7)  Cramps,leg  (ICD-729.82) 8)  Cough  (ICD-786.2) 9)  Hypothyroidism, Unspecified  (ICD-244.9) 10)  Osteoarthritis  (ICD-715.90) 11)  Hypertension  (ICD-401.9) 12)  Hyperlipidemia  (ICD-272.4) 13)  Coronary Artery Disease  (ICD-414.00)  Current Medications (verified): 1)  Metoprolol Tartrate 100 Mg Tabs (Metoprolol Tartrate) .Marland Kitchen.. 1 1/2 Tab By Mouth Two Times A Day 2)  Plavix 75 Mg Tabs (Clopidogrel Bisulfate) .Marland Kitchen.. 1 Qd 3)  Levothroid 75 Mcg Tabs (Levothyroxine Sodium) .... Qd 4)  Methyldopa 250 Mg  Tabs (Methyldopa) .Marland Kitchen.. 1 By Mouth Bid 5)  Meclizine Hcl 25 Mg  Tabs (Meclizine Hcl) .Marland Kitchen.. 1 Three Times A Day 6)  Pravastatin Sodium 20 Mg  Tabs (Pravastatin Sodium) .... One By Mouth At Bedtime 7)  Aspirin 325 Mg  Tabs (Aspirin) .Marland Kitchen.. 1 Tab By Mouth Once Daily 8)  Nitrostat 0.4 Mg Subl (Nitroglycerin) .Marland Kitchen.. 1 Tablet Under Tongue At Onset of Chest Pain; You May Repeat Every 5 Minutes For Up To 3 Doses. 9)  Co Q 10 .... Daily  Allergies (verified): 1)  ! Pcn 2)  Vytorin 3)  Crestor  Past History:  Past Medical History: Last updated: 08/28/2009  1. Coronary artery disease status post prior left anterior descending       stenting and percutaneous transluminal coronary angioplasty of the       circumflex this admission.   2. Hypertension.   3. Hyperlipidemia.   4. Hypothyroidism.   5. Ischemic cardiomyopathy/acute systolic congestive heart failure.       Ejection fraction of 45%.   6. Moderate mitral regurgitation.   7. Moderate tricuspid regurgitation.   8. Anemia, status post 1 unit of packed red blood cells.   9. Klebsiella urinary tract infection, sensitive to Cipro.   10.Stage III chronic kidney disease with a creatinine of 1.57 today.  11.Osteoarthritis Elev glu H/o CVA Vit D def  Past Surgical History: Last updated: 08/28/2009  left anterior descending  stenting and percutaneous transluminal coronary angioplasty of the    circumflex Hysterectomy  Back surgery.   Family History: Last updated: 08/28/2009 Family History Hypertension   Her mother died of a brain aneurysm, father with an MI   in the 55s.      Social History: Last updated: 08/28/2009 Retired Married 60 years 2011 Never Smoked Non-drinker  Review of Systems       Denies fever, malais, weight loss, blurry vision, decreased visual acuity, cough, sputum, SOB, hemoptysis, pleuritic pain, palpitaitons, heartburn, abdominal pain, melena, lower extremity edema, claudication, or rash.   Vital Signs:  Patient profile:   77 year old female Height:      67 inches Weight:      146 pounds BMI:     22.95 Pulse rate:   57 / minute BP sitting:   160 /  70  (right arm) Cuff size:   large  Vitals Entered By: Burnett Kanaris, CNA (December 12, 2009 8:37 AM)  Physical Exam  General:  Affect appropriate Healthy:  appears stated age HEENT: normal Neck supple with no adenopathy JVP normal no bruits no thyromegaly Lungs clear with no wheezing and good diaphragmatic motion Heart:  S1/S2 no murmur,rub, gallop or click PMI normal Abdomen: benighn, BS positve, no tenderness, no AAA no bruit.  No HSM or HJR Distal pulses intact with no bruits No edema Neuro non-focal Skin warm and  dry    Impression & Recommendations:  Problem # 1:  CORONARY ATHEROSCLEROSIS NATIVE CORONARY ARTERY (ICD-414.01) Stable no angina  Continue ASA and Plavix for at least a year Her updated medication list for this problem includes:    Metoprolol Tartrate 100 Mg Tabs (Metoprolol tartrate) .Marland Kitchen... 1 1/2 tab by mouth two times a day    Plavix 75 Mg Tabs (Clopidogrel bisulfate) .Marland Kitchen... 1 qd    Aspirin 325 Mg Tabs (Aspirin) .Marland Kitchen... 1 tab by mouth once daily    Nitrostat 0.4 Mg Subl (Nitroglycerin) .Marland Kitchen... 1 tablet under tongue at onset of chest pain; you may repeat every 5 minutes for up to 3 doses.  Problem # 2:  HYPERTENSION (ICD-401.9) Well controlled Her updated medication list for this problem includes:    Metoprolol Tartrate 100 Mg Tabs (Metoprolol tartrate) .Marland Kitchen... 1 1/2 tab by mouth two times a day    Methyldopa 250 Mg Tabs (Methyldopa) .Marland Kitchen... 1 by mouth bid    Aspirin 325 Mg Tabs (Aspirin) .Marland Kitchen... 1 tab by mouth once daily  Problem # 3:  HYPERLIPIDEMIA (ICD-272.4) Continue statin labs in 6 months Her updated medication list for this problem includes:    Pravastatin Sodium 20 Mg Tabs (Pravastatin sodium) ..... One by mouth at bedtime  Patient Instructions: 1)  Your physician recommends that you schedule a follow-up appointment in: 3 months

## 2010-04-02 NOTE — Progress Notes (Signed)
Summary: advil  Phone Note Call from Patient Call back at Home Phone [phone]   Caller: Patient Reason for Call: Talk to Nurse Summary of Call: just dc from hospital, they took her off Advil and request to speak to nurse  Initial call taken by: Migdalia Dk,  August 27, 2009 8:34 AM  Follow-up for Phone Call        spoke with pt, she was not given advil to take at the time of discharge and she wants to know if she can restart. discussed with dr crenshaw(DOD). would prefer pt not use advil due to her kidney function. pt aware, she has an appt with dr Riley Kill this week and will discuss further at that time. Deliah Goody, RN  August 27, 2009 11:18 AM\par

## 2010-04-02 NOTE — Letter (Signed)
Summary: Appointment - Reminder 2  [facility], Main Office  [address]   [place], [place] [address]   Phone: [phone]  Fax: [phone]     May 03, 2009 MRN: [id number]   [facility] [name] [address] [place], [place]  [address]   Dear Ms. Turnbough,  Our records indicate that it is time to schedule a follow-up appointment with Dr. Eden Emms. It is very important that we reach you to schedule this appointment. We look forward to participating in your health care needs. Please contact us at the number listed above at your earliest convenience to schedule your appointment.  If you are unable to make an appointment at this time, give [place] a call so we can update our records.     Sincerely,   Migdalia Dk [facility] Scheduling Team

## 2010-04-02 NOTE — Cardiovascular Report (Signed)
Summary: Pre-Cath Orders  Pre-Cath Orders   Imported By: Marylou Mccoy 11/27/2009 11:41:56  _____________________________________________________________________  External Attachment:    Type:   Image     Comment:   External Document

## 2010-04-02 NOTE — Miscellaneous (Signed)
Summary: meds  Clinical Lists Changes  Medications: Rx of PRAVASTATIN SODIUM 20 MG  TABS (PRAVASTATIN SODIUM) 1 by mouth bid;  #180 x 3;  Signed;  Entered by: Tresa Garter MD;  Authorized by: Tresa Garter MD;  Method used: Faxed to [facility] [place], , , [place]  , Ph: [phone], Fax: [phone] Rx of METHYLDOPA 250 MG  TABS (METHYLDOPA) 1 by mouth tid;  #270 x 3;  Signed;  Entered by: Tresa Garter MD;  Authorized by: Tresa Garter MD;  Method used: Faxed to [facility] [place], , , [place]  , Ph: [phone], Fax: [phone]    Prescriptions: METHYLDOPA 250 MG  TABS (METHYLDOPA) 1 by mouth tid  #270 x 3   Entered and Authorized by:   Tresa Garter MD   Signed by:   Tresa Garter MD on 01/15/2010   Method used:   Faxed to ...       [facility] [place] (mail-order)             , [place]         Ph: [phone]       Fax: [phone]   RxID:   [id number] PRAVASTATIN SODIUM 20 MG  TABS (PRAVASTATIN SODIUM) 1 by mouth bid  #180 x 3   Entered and Authorized by:   Tresa Garter MD   Signed by:   Tresa Garter MD on 01/15/2010   Method used:   Faxed to ...       [facility] [place] (mail-order)             , [place]         Ph: [phone]       Fax: [phone]   RxID:   [id number]

## 2010-04-02 NOTE — Assessment & Plan Note (Signed)
Summary: 3 MO ROV /NWS  #   Vital Signs:  Patient profile:   77 year old female Height:      67 inches Weight:      144 pounds BMI:     22.64 Temp:     97.8 degrees F oral Pulse rate:   64 / minute Pulse rhythm:   irregular Resp:     16 per minute BP sitting:   160 / 70  (left arm) Cuff size:   regular  Vitals Entered By: Lanier Prude, CMA(AAMA) (January 15, 2010 8:07 AM) CC: 3 mo f/u Is Patient Diabetic? No   Primary Care Provider:  Dr. Avel Sensor  CC:  3 mo f/u.  History of Present Illness: The patient presents for a follow up of hypertension, CAD, hyperlipidemia Coronary artery disease status post PROMUS drug-eluting stent     placement to the mid and distal left anterior descending (coronary     artery), on either side of the previously placed/widely patent     Cypher stent November 21, 2009.   Current Medications (verified): 1)  Metoprolol Tartrate 100 Mg Tabs (Metoprolol Tartrate) .Marland Kitchen.. 1 1/2 Tab By Mouth Two Times A Day 2)  Plavix 75 Mg Tabs (Clopidogrel Bisulfate) .Marland Kitchen.. 1 Qd 3)  Levothroid 75 Mcg Tabs (Levothyroxine Sodium) .... Qd 4)  Methyldopa 250 Mg  Tabs (Methyldopa) .Marland Kitchen.. 1 By Mouth Bid 5)  Meclizine Hcl 25 Mg  Tabs (Meclizine Hcl) .Marland Kitchen.. 1 Three Times A Day 6)  Pravastatin Sodium 20 Mg  Tabs (Pravastatin Sodium) .... One By Mouth At Bedtime 7)  Aspirin 325 Mg  Tabs (Aspirin) .Marland Kitchen.. 1 Tab By Mouth Once Daily 8)  Nitrostat 0.4 Mg Subl (Nitroglycerin) .Marland Kitchen.. 1 Tablet Under Tongue At Onset of Chest Pain; You May Repeat Every 5 Minutes For Up To 3 Doses. 9)  Co Q 10 .... Daily  Allergies (verified): 1)  ! Pcn 2)  Vytorin 3)  Crestor  Past History:  Past Surgical History:  left anterior descending  stenting and percutaneous transluminal coronary angioplasty of the    circumflex Hysterectomy  Back surgery.  Coronary artery disease status post PROMUS drug-eluting stent     placement to the mid and distal left anterior descending (coronary     artery), on  either side of the previously placed/widely patent     Cypher stent November 21, 2009.  Physical Exam  General:  Affect appropriate Healthy:  appears stated age HEENT: normal Neck supple with no adenopathy JVP normal no bruits no thyromegaly Lungs clear with no wheezing and good diaphragmatic motion Heart:  S1/S2 no murmur,rub, gallop or click PMI normal Abdomen: benighn, BS positve, no tenderness, no AAA no bruit.  No HSM or HJR Distal pulses intact with no bruits No edema Neuro non-focal Skin warm and dry  Nose:  External nasal examination shows no deformity or inflammation. Nasal mucosa are pink and moist without lesions or exudates. Mouth:  Oral mucosa and oropharynx without lesions or exudates.  Teeth in good repair. Lungs:  Normal respiratory effort, chest expands symmetrically. Lungs are clear to auscultation, no crackles or wheezes. Heart:  Normal rate and regular rhythm. S1 and S2 normal without gallop, murmur, click, rub or other extra sounds. Abdomen:  Bowel sounds positive,abdomen soft and non-tender without masses, organomegaly or hernias noted. Msk:  No deformity or scoliosis noted of thoracic or lumbar spine.   Extremities:  No clubbing, cyanosis, edema, or deformity noted with normal full range of motion of all joints.  Neurologic:  No cranial nerve deficits noted. Station and gait are normal. Plantar reflexes are down-going bilaterally. DTRs are symmetrical throughout. Sensory, motor and coordinative functions appear intact. Skin:  Intact without suspicious lesions or rashes Moles on back R big toenail deformed and thick Psych:  Cognition and judgment appear intact. Alert and cooperative with normal attention span and concentration. No apparent delusions, illusions, hallucinations   Impression & Recommendations:  Problem # 1:  CORONARY ATHEROSCLEROSIS NATIVE CORONARY ARTERY (ICD-414.01) Assessment Improved  Her updated medication list for this problem  includes:    Metoprolol Tartrate 100 Mg Tabs (Metoprolol tartrate) .Marland Kitchen... 1 1/2 tab by mouth two times a day    Plavix 75 Mg Tabs (Clopidogrel bisulfate) .Marland Kitchen... 1 qd    Methyldopa 250 Mg Tabs (Methyldopa) .Marland Kitchen... 1 by mouth tid    Aspirin 325 Mg Tabs (Aspirin) .Marland Kitchen... 1 tab by mouth once daily    Nitrostat 0.4 Mg Subl (Nitroglycerin) .Marland Kitchen... 1 tablet under tongue at onset of chest pain; you may repeat every 5 minutes for up to 3 doses. Coronary artery disease status post PROMUS drug-eluting stent     placement to the mid and distal left anterior descending (coronary     artery), on either side of the previously placed/widely patent     Cypher stent November 21, 2009.  Problem # 2:  SYNCOPE (ICD-780.2) - resolved Assessment: Comment Only  Problem # 3:  RENAL INSUFFICIENCY (ICD-588.9) Assessment: Unchanged The labs were reviewed with the patient.   Problem # 4:  HYPOTHYROIDISM, UNSPECIFIED (ICD-244.9) Assessment: Unchanged  Her updated medication list for this problem includes:    Levothroid 75 Mcg Tabs (Levothyroxine sodium) ..... Qd  Problem # 5:  HYPERTENSION (ICD-401.9) Assessment: Unchanged  Her updated medication list for this problem includes:    Metoprolol Tartrate 100 Mg Tabs (Metoprolol tartrate) .Marland Kitchen... 1 1/2 tab by mouth two times a day    Methyldopa 250 Mg Tabs (Methyldopa) .Marland Kitchen... 1 by mouth tid  Problem # 6:  VITAMIN D DEFICIENCY (ICD-268.9) Assessment: Improved On the regimen of medicine(s) reflected in the chart    Complete Medication List: 1)  Metoprolol Tartrate 100 Mg Tabs (Metoprolol tartrate) .Marland Kitchen.. 1 1/2 tab by mouth two times a day 2)  Plavix 75 Mg Tabs (Clopidogrel bisulfate) .Marland Kitchen.. 1 qd 3)  Levothroid 75 Mcg Tabs (Levothyroxine sodium) .... Qd 4)  Methyldopa 250 Mg Tabs (Methyldopa) .Marland Kitchen.. 1 by mouth tid 5)  Meclizine Hcl 25 Mg Tabs (Meclizine hcl) .Marland Kitchen.. 1 three times a day 6)  Pravastatin Sodium 20 Mg Tabs (Pravastatin sodium) .Marland Kitchen.. 1 by mouth bid 7)  Aspirin 325 Mg  Tabs (Aspirin) .Marland Kitchen.. 1 tab by mouth once daily 8)  Nitrostat 0.4 Mg Subl (Nitroglycerin) .Marland Kitchen.. 1 tablet under tongue at onset of chest pain; you may repeat every 5 minutes for up to 3 doses. 9)  Co Q 10  .... Daily  Patient Instructions: 1)  Please schedule a follow-up appointment in 3 months. 2)  BMP prior to visit, ICD-9: 3)  Hepatic Panel prior to visit, ICD-9: 4)  Lipid Panel prior to visit, ICD-9: 995.20 414.8 5)  TSH prior to visit, ICD-9: Prescriptions: PRAVASTATIN SODIUM 20 MG  TABS (PRAVASTATIN SODIUM) 1 by mouth bid  #180 x 3   Entered and Authorized by:   Tresa Garter MD   Signed by:   Tresa Garter MD on 01/15/2010   Method used:   Electronically to        [id number] (retail)       [address]       [place], [place]  [address]       Ph: [phone] or [phone]       Fax: [phone]   RxID:   [id number] METHYLDOPA 250 MG  TABS (METHYLDOPA) 1 by mouth tid  #270 x 3   Entered and Authorized by:   Tresa Garter MD   Signed by:   Tresa Garter MD on 01/15/2010   Method used:   Electronically to        [id number] (ret[facility])       [address]       [place], [place]  [address]       Ph: [phone] or [phone]       Fax: [phone]   RxID:   [id number]     Immunization History:  Influenza Immunization History:    Influenza:  historical (11/08/2009)   Immunization History:  Influenza Immunization History:    Influenza:  Historical (11/08/2009)

## 2010-04-02 NOTE — Assessment & Plan Note (Signed)
Summary: eph/post cath    Primary Provider:  Dr. Avel Sensor   History of Present Illness: Jean [name] returns today for F/U of CAD.  She has had multiple interventions recently.  She had and acute circ infarct that was Rx with angioplasty.  She has an old LAD stent that was patent.  She was scheduled to have staged intervention to the distal LAD by Dr Riley Kill but at that time she had restenosis of the circ and the LAD never got done.  TS has been concerned about her renal function.   On 7/5 she had prerenal azotemia with BUN 67 and Cr 2.3.  Renal functin recovered quickly with las 3 Cr being 1.3-1.5  Review of her cines show a tight LAD distat to the previously placed stent.  Prefer to have this fixed with stenting.  Will have her F/U with TS to discuss.  His last note indicated possible medical Rx for this.  Although I agree that reintervention to the circ was indicated on recath, I think the LAD is tight and should be stented.  The patient wishes TS to perform any further interventions since he knows her well.    Current Problems (verified): 1)  Syncope  (ICD-780.2) 2)  Gastroenteritis  (ICD-558.9) 3)  Ingrown Toenail  (ICD-703.0) 4)  Renal Insufficiency  (ICD-588.9) 5)  Vitamin D Deficiency  (ICD-268.9) 6)  Cramps,leg  (ICD-729.82) 7)  Cough  (ICD-786.2) 8)  Hypothyroidism, Unspecified  (ICD-244.9) 9)  Osteoarthritis  (ICD-715.90) 10)  Hypertension  (ICD-401.9) 11)  Hyperlipidemia  (ICD-272.4) 12)  Coronary Artery Disease  (ICD-414.00)  Current Medications (verified): 1)  Metoprolol Tartrate 100 Mg Tabs (Metoprolol Tartrate) .Marland Kitchen.. 1 1/2 Tab By Mouth Two Times A Day 2)  Plavix 75 Mg Tabs (Clopidogrel Bisulfate) .Marland Kitchen.. 1 Qd 3)  Levothroid 75 Mcg Tabs (Levothyroxine Sodium) .... Qd 4)  Methyldopa 250 Mg  Tabs (Methyldopa) .Marland Kitchen.. 1 By Mouth Bid 5)  Meclizine Hcl 25 Mg  Tabs (Meclizine Hcl) .Marland Kitchen.. 1 Three Times A Day 6)  Pravastatin Sodium 20 Mg  Tabs (Pravastatin Sodium) .... One By Mouth At  Bedtime 7)  Aspirin 325 Mg  Tabs (Aspirin) .Marland Kitchen.. 1 Tab By Mouth Once Daily 8)  Nitrostat 0.4 Mg Subl (Nitroglycerin) .Marland Kitchen.. 1 Tablet Under Tongue At Onset of Chest Pain; You May Repeat Every 5 Minutes For Up To 3 Doses.  Allergies (verified): 1)  ! Pcn 2)  Vytorin 3)  Crestor  Past History:  Past Medical History: Last updated: 08/28/2009  1. Coronary artery disease status post prior left anterior descending       stenting and percutaneous transluminal coronary angioplasty of the       circumflex this admission.   2. Hypertension.   3. Hyperlipidemia.   4. Hypothyroidism.   5. Ischemic cardiomyopathy/acute systolic congestive heart failure.       Ejection fraction of 45%.   6. Moderate mitral regurgitation.   7. Moderate tricuspid regurgitation.   8. Anemia, status post 1 unit of packed red blood cells.   9. Klebsiella urinary tract infection, sensitive to Cipro.   10.Stage III chronic kidney disease with a creatinine of 1.57 today.  11.Osteoarthritis Elev glu H/o CVA Vit D def  Past Surgical History: Last updated: 08/28/2009  left anterior descending  stenting and percutaneous transluminal coronary angioplasty of the    circumflex Hysterectomy  Back surgery.   Family History: Last updated: 08/28/2009 Family History Hypertension   Her mother died of a brain aneurysm, father with an MI  in the 80s.      Social History: Last updated: 08/28/2009 Retired Married 60 years 2011 Never Smoked Non-drinker  Review of Systems       Denies fever, malais, weight loss, blurry vision, decreased visual acuity, cough, sputum, SOB, hemoptysis, pleuritic pain, palpitaitons, heartburn, abdominal pain, melena, lower extremity edema, claudication, or rash.   Vital Signs:  Patient profile:   77 year old female Height:      67 inches Weight:      148 pounds BMI:     23.26 Pulse rate:   53 / minute Resp:     16 per minute BP sitting:   140 / 76  (left arm)  Vitals Entered By:  [name] (October 30, 2009 4:04 PM)  Physical Exam  General:  Affect appropriate Healthy:  appears stated age HEENT: normal Neck supple with no adenopathy JVP normal no bruits no thyromegaly Lungs clear with no wheezing and good diaphragmatic motion Heart:  S1/S2 no murmur,rub, gallop or click PMI normal Abdomen: benighn, BS positve, no tenderness, no AAA no bruit.  No HSM or HJR Distal pulses intact with no bruits No edema Neuro non-focal Skin warm and dry    Impression & Recommendations:  Problem # 1:  HYPERTENSION (ICD-401.9) Well controlled avoid diuretic around time of cath given history of prerenal azotemia.   The following medications were removed from the medication list:    Furosemide 40 Mg Tabs (Furosemide) .Marland Kitchen... Take one tablet by mouth daily. Her updated medication list for this problem includes:    Metoprolol Tartrate 100 Mg Tabs (Metoprolol tartrate) .Marland Kitchen... 1 1/2 tab by mouth two times a day    Methyldopa 250 Mg Tabs (Methyldopa) .Marland Kitchen... 1 by mouth bid    Aspirin 325 Mg Tabs (Aspirin) .Marland Kitchen... 1 tab by mouth once daily  Problem # 2:  CORONARY ARTERY DISEASE (ICD-414.00) F/U TS Prefer stenting of distal LAD.  Alternative of medical Rx or myovue and medical Rx if not a lot of anteroapical ischemia possible but I think patient concerned that lesion is "tight" Her updated medication list for this problem includes:    Metoprolol Tartrate 100 Mg Tabs (Metoprolol tartrate) .Marland Kitchen... 1 1/2 tab by mouth two times a day    Plavix 75 Mg Tabs (Clopidogrel bisulfate) .Marland Kitchen... 1 qd    Aspirin 325 Mg Tabs (Aspirin) .Marland Kitchen... 1 tab by mouth once daily    Nitrostat 0.4 Mg Subl (Nitroglycerin) .Marland Kitchen... 1 tablet under tongue at onset of chest pain; you may repeat every 5 minutes for up to 3 doses.  Problem # 3:  HYPERLIPIDEMIA (ICD-272.4) At goal with no side effects Her updated medication list for this problem includes:    Pravastatin Sodium 20 Mg Tabs (Pravastatin sodium) ..... One by  mouth at bedtime  CHOL: 205 (06/01/2009)   LDL: DEL (03/20/2008)   HDL: 46.70 (06/01/2009)   TG: 275.0 (06/01/2009)  Patient Instructions: 1)  Your physician recommends that you schedule a follow-up appointment WITH DR Riley Kill 2)  FOLLOW UP WITH DR Eden Emms   EKG Report  Procedure date:  10/30/2009  Findings:      NSR 53 PR 234 LAD Nonspecific ST/T wave changes QT 500/469 Abnormal ECG

## 2010-04-02 NOTE — Assessment & Plan Note (Signed)
Summary: ROV   Visit Type:  rov Primary Spike Desilets:  Dr. Avel Sensor  CC:  pt states she had cp one time a few weeks ago....denies any sob or edema.  History of Present Illness: Asked to see by Dr. Eden Emms.  He wants patient to have PCI of LAD.  That had been planned previously for high grade residual disease, after CFX acute lesion.  Then had repeat PCI of CFX with DES.  Had one episode of chest pain recently, lasting about an hour.  Did not tell husband, but went to bed and it eased off.  Still has residual LAD disease above and below the stent.  No exertional symptoms.   Current Medications (verified): 1)  Metoprolol Tartrate 100 Mg Tabs (Metoprolol Tartrate) .Marland Kitchen.. 1 1/2 Tab By Mouth Two Times A Day 2)  Plavix 75 Mg Tabs (Clopidogrel Bisulfate) .Marland Kitchen.. 1 Qd 3)  Levothroid 75 Mcg Tabs (Levothyroxine Sodium) .... Qd 4)  Methyldopa 250 Mg  Tabs (Methyldopa) .Marland Kitchen.. 1 By Mouth Bid 5)  Meclizine Hcl 25 Mg  Tabs (Meclizine Hcl) .Marland Kitchen.. 1 Three Times A Day 6)  Pravastatin Sodium 20 Mg  Tabs (Pravastatin Sodium) .... One By Mouth At Bedtime 7)  Aspirin 325 Mg  Tabs (Aspirin) .Marland Kitchen.. 1 Tab By Mouth Once Daily 8)  Nitrostat 0.4 Mg Subl (Nitroglycerin) .Marland Kitchen.. 1 Tablet Under Tongue At Onset of Chest Pain; You May Repeat Every 5 Minutes For Up To 3 Doses.  Allergies: 1)  ! Pcn 2)  Vytorin 3)  Crestor  Past History:  Past Medical History: Last updated: 08/28/2009  1. Coronary artery disease status post prior left anterior descending       stenting and percutaneous transluminal coronary angioplasty of the       circumflex this admission.   2. Hypertension.   3. Hyperlipidemia.   4. Hypothyroidism.   5. Ischemic cardiomyopathy/acute systolic congestive heart failure.       Ejection fraction of 45%.   6. Moderate mitral regurgitation.   7. Moderate tricuspid regurgitation.   8. Anemia, status post 1 unit of packed red blood cells.   9. Klebsiella urinary tract infection, sensitive to Cipro.   10.Stage III  chronic kidney disease with a creatinine of 1.57 today.  11.Osteoarthritis Elev glu H/o CVA Vit D def  Past Surgical History: Last updated: 08/28/2009  left anterior descending  stenting and percutaneous transluminal coronary angioplasty of the    circumflex Hysterectomy  Back surgery.   Family History: Last updated: 08/28/2009 Family History Hypertension   Her mother died of a brain aneurysm, father with an MI   in the 48s.      Social History: Last updated: 08/28/2009 Retired Married 60 years 2011 Never Smoked Non-drinker  Vital Signs:  Patient profile:   77 year old female Height:      67 inches Weight:      147 pounds BMI:     23.11 Pulse rate:   54 / minute Pulse rhythm:   irregular BP sitting:   118 / 80  (left arm) Cuff size:   large  Vitals Entered By: Danielle Rankin, CMA (November 12, 2009 12:07 PM)  Physical Exam  General:  Well developed, well nourished, in no acute distress. Head:  normocephalic and atraumatic Eyes:  PERRLA/EOM intact; conjunctiva and lids normal. Lungs:  Clear bilaterally to auscultation and percussion. Heart:  Non-displaced PMI, chest non-tender; regular rate and rhythm, S1, S2 without murmurs, rubs or gallops. Carotid upstroke normal, no bruit.  Abdomen:  Bowel sounds positive; abdomen soft and non-tender without masses, organomegaly, or hernias noted. No hepatosplenomegaly. Extremities:  No clubbing or cyanosis. Neurologic:  Alert and oriented x 3.   EKG  Procedure date:  11/12/2009  Findings:      SB with first degree av block.  Left axis deviation.  Cardiac Cath  Procedure date:  08/29/2009  Findings:      ANGIOGRAPHIC DATA: 1. There is calcification of the coronary arteries on plain     fluoroscopy, calcification is noted in the stent as noted in the     LAD. 2. The left main is calcified with about 20% to 30% area of plaquing     in the midportion of the vessel. 3. The LAD has about 30% stenosis proximally and a  40% stenosis.     There is a first diagonal with 70% ostial and 70% mid disease.     There is about 50% stenosis before previously placed drug-eluting     stent from several years ago.  The drug-eluting stent remains     widely patent.  There is an 80% stenosis in a steep bend just     beyond the stent site.  There is tortuosity in the distal vessel.     There is a diagonal that comes out after the steep bend, which is     calcified and has about 60% ostial narrowing.  There is a septal     perforator that comes out the mid stent site that has 90%     narrowing.   CONCLUSIONS: 1. High-grade restenosis at the previous site of angioplasty done     during primary PCI with successful percutaneous stenting of the mid     circumflex coronary artery using a drug-eluting platform. 2. Continued patency of the LAD stent with mild disease proximal and     more severe disease distal to the lesion.   DISPOSITION:  We will continue to follow the patient closely. Importantly, the patient has a creatinine clearance of just over 30 when she is optimally managed.  She cannot take ACE inhibitors at the present time and she tends to remain less than reasonably hydrated.  Her family has been encouraging her.  We will see how she does with this and follow her creatinine closely.  We made every attempt to limit contrast today. We will proceed with re-discussion in followup.         Arturo Morton. Riley Kill, MD, [facility]        Impression & Recommendations:  Problem # 1:  CORONARY ARTERY DISEASE (ICD-414.00) recent STEMI with subsequent PCI, with balloon only, followed by a later procedure with stent to the OM.  Has progressive lesion in LAD beyond prior stent.  She and Dr. Eden Emms have discussed options, and they all favor repeat PCI of LAD.  I have reviewed all of the data and findings with the patient, and discussed medical options as well given mild symptoms.  With prior events, they want to proceed.  I  informed them that this might or might not require two more stents.   She is stable at present but has had some angina.  Will hydrate prior to the procedure.  Her updated medication list for this problem includes:    Metoprolol Tartrate 100 Mg Tabs (Metoprolol tartrate) .Marland Kitchen... 1 1/2 tab by mouth two times a day    Plavix 75 Mg Tabs (Clopidogrel bisulfate) .Marland Kitchen... 1 qd    Aspirin 325 Mg Tabs (Aspirin) .Marland KitchenMarland KitchenMarland KitchenMarland Kitchen  1 tab by mouth once daily    Nitrostat 0.4 Mg Subl (Nitroglycerin) .Marland Kitchen... 1 tablet under tongue at onset of chest pain; you may repeat every 5 minutes for up to 3 doses.  Orders: EKG w/ Interpretation (93000)  Problem # 2:  RENAL INSUFFICIENCY (ICD-588.9) Cr is better controlled now off of diuretics.    Problem # 3:  HYPERLIPIDEMIA (ICD-272.4) currently on lipid lowering treatment.  Her updated medication list for this problem includes:    Pravastatin Sodium 20 Mg Tabs (Pravastatin sodium) ..... One by mouth at bedtime  Patient Instructions: 1)  Your physician has requested that you have a cardiac catheterization.  Cardiac catheterization is used to diagnose and/or treat various heart conditions. Doctors may recommend this procedure for a number of different reasons. The most common reason is to evaluate chest pain. Chest pain can be a symptom of coronary artery disease (CAD), and cardiac catheterization can show whether plaque is narrowing or blocking your heart's arteries. This procedure is also used to evaluate the valves, as well as measure the blood flow and oxygen levels in different parts of your heart.  For further information please visit [email or web address]  Please follow instruction sheet, as given. 2)  Your physician recommends that you return for lab work cbc,bmet, pt, ptt 3)  Your physician recommends that you continue on your current medications as directed. Please refer to the Current Medication list given to you today.

## 2010-04-02 NOTE — Progress Notes (Signed)
Summary: pt rtn call to get lab results  Phone Note Call from Patient Call back at Home Phone [phone]   Caller: Patient Reason for Call: Talk to Nurse, Talk to Doctor, Lab or Test Results Summary of Call: pt rtn call to lauren to get lab results Initial call taken by: Omer Jack,  November 30, 2009 9:18 AM  Follow-up for Phone Call        11/30/09--930am--results of labs given--encouraged to drink lots of fluids--pt agrees--nt Follow-up by: Ledon Snare, RN,  November 30, 2009 9:40 AM

## 2010-04-02 NOTE — Progress Notes (Signed)
Summary: calling re rs procedure  Phone Note Call from Patient   Caller: Patient Reason for Call: Talk to Nurse Summary of Call: pt had procedure scheduled for last thurs and we were to rs, thought procedure was to be rs to tomorrow-has not heard anything-pls call [phone] Initial call taken by: Glynda Jaeger,  September 12, 2009 8:53 AM  Follow-up for Phone Call        I spoke with the pt and her husband.  At this time Dr Riley Kill would like to wait until the week of 09/24/09 for PCI.  The pt will continue to hold Furosemide and Potassium.  The pt will have a BMP, CBC, PT/INR rechecked on 09/24/09 and then PCI will be scheduled.  Follow-up by: Julieta Gutting, RN, BSN,  September 12, 2009 10:37 AM

## 2010-04-02 NOTE — Assessment & Plan Note (Signed)
Summary: EPH   Primary Provider:  Dr. Avel Sensor  CC:  follow up.  History of Present Illness: MS. Jean [name] is in for a follow up visit.  She recently presented and had a lateral wall MI with occlusion of the CFX which was successfully opened.  We elected to defer PCI of her LAD to a later date.  Since discharge, she has been fatigued but without chest pain.  Denies other symptoms at present.  Our plan was to allow her Hgb to recover, and to hold off on further interventions so renal function, Hgb, and status could all recover.  She received blood at the time of hospitalization.   Current Medications (verified): 1)  Metoprolol Tartrate 100 Mg Tabs (Metoprolol Tartrate) .Marland Kitchen.. 1 1/2 Tab By Mouth Two Times A Day 2)  Plavix 75 Mg Tabs (Clopidogrel Bisulfate) .Marland Kitchen.. 1 Qd 3)  Levothroid 75 Mcg Tabs (Levothyroxine Sodium) .... Qd 4)  Methyldopa 250 Mg  Tabs (Methyldopa) .Marland Kitchen.. 1 By Mouth Bid 5)  Meclizine Hcl 25 Mg  Tabs (Meclizine Hcl) .Marland Kitchen.. 1 Three Times A Day 6)  Pravastatin Sodium 20 Mg  Tabs (Pravastatin Sodium) .... One By Mouth At Bedtime 7)  Aspirin 325 Mg  Tabs (Aspirin) .Marland Kitchen.. 1 Tab By Mouth Once Daily 8)  Furosemide 40 Mg Tabs (Furosemide) .... Take One Tablet By Mouth Daily. 9)  Nitrostat 0.4 Mg Subl (Nitroglycerin) .Marland Kitchen.. 1 Tablet Under Tongue At Onset of Chest Pain; You May Repeat Every 5 Minutes For Up To 3 Doses. 10)  Potassium Chloride Crys Cr 20 Meq Cr-Tabs (Potassium Chloride Crys Cr) .Marland Kitchen.. 1 Tab By Mouth Once Daily  Allergies: 1)  ! Pcn 2)  Vytorin 3)  Crestor  Past History:  Past Medical History: Last updated: 08/28/2009  1. Coronary artery disease status post prior left anterior descending       stenting and percutaneous transluminal coronary angioplasty of the       circumflex this admission.   2. Hypertension.   3. Hyperlipidemia.   4. Hypothyroidism.   5. Ischemic cardiomyopathy/acute systolic congestive heart failure.       Ejection fraction of 45%.   6. Moderate mitral  regurgitation.   7. Moderate tricuspid regurgitation.   8. Anemia, status post 1 unit of packed red blood cells.   9. Klebsiella urinary tract infection, sensitive to Cipro.   10.Stage III chronic kidney disease with a creatinine of 1.57 today.  11.Osteoarthritis Elev glu H/o CVA Vit D def  Past Surgical History: Last updated: 08/28/2009  left anterior descending  stenting and percutaneous transluminal coronary angioplasty of the    circumflex Hysterectomy  Back surgery.   Family History: Reviewed history from 08/28/2009 and no changes required. Family History Hypertension   Her mother died of a brain aneurysm, father with an MI   in the 59s.      Social History: Reviewed history from 08/28/2009 and no changes required. Retired Married 60 years 2011 Never Smoked Non-drinker  Vital Signs:  Patient profile:   77 year old female Height:      67 inches Weight:      144 pounds BMI:     22.64 Pulse rate:   63 / minute Resp:     14 per minute BP sitting:   134 / 71  (left arm)  Vitals Entered By: [name] (August 29, 2009 3:19 PM)  Physical Exam  General:  Well developed, well nourished, in no acute distress. Head:  normocephalic and atraumatic Eyes:  PERRLA/EOM  intact; conjunctiva and lids normal. Lungs:  Clear bilaterally to auscultation and percussion. Heart:  PMI non displaced.  Normal S1 and S2.  Without rub or gallop. Abdomen:  Bowel sounds positive; abdomen soft and non-tender without masses, organomegaly, or hernias noted. No hepatosplenomegaly. Extremities:  No clubbing or cyanosis. Neurologic:  Alert and oriented x 3.   EKG  Procedure date:  08/29/2009  Findings:      NSR.  LVH.  Left axis deviation.  Delay in R wave progression.  Impression & Recommendations:  Problem # 1:  CORONARY ARTERY DISEASE (ICD-414.00) recent lateral wall MI, new with POBA.   Has residual LAD disease.  Plan PCI of LAD on an elective basis.  We reviewed in detail all of  the merits and potential issues with conservative management of her other disease, and reviewed the options in great detail.  She wants to proceed to PCI  (90%) after the stent site in the LAD.   The following medications were removed from the medication list:    Benazepril-hydrochlorothiazide 20-25 Mg Tabs (Benazepril-hydrochlorothiazide) .Marland Kitchen... Take 1 tablet by mouth once a day Her updated medication list for this problem includes:    Metoprolol Tartrate 100 Mg Tabs (Metoprolol tartrate) .Marland Kitchen... 1 1/2 tab by mouth two times a day    Plavix 75 Mg Tabs (Clopidogrel bisulfate) .Marland Kitchen... 1 qd    Aspirin 325 Mg Tabs (Aspirin) .Marland Kitchen... 1 tab by mouth once daily    Nitrostat 0.4 Mg Subl (Nitroglycerin) .Marland Kitchen... 1 tablet under tongue at onset of chest pain; you may repeat every 5 minutes for up to 3 doses.  Orders: EKG w/ Interpretation (93000) Cardiac Catheterization (Cardiac Cath)  Problem # 2:  RENAL INSUFFICIENCY (ICD-588.9) recheck BMET.  Problem # 3:  HYPERTENSION (ICD-401.9) controlled.  Now off of ACE until after procedure.  The following medications were removed from the medication list:    Benazepril-hydrochlorothiazide 20-25 Mg Tabs (Benazepril-hydrochlorothiazide) .Marland Kitchen... Take 1 tablet by mouth once a day Her updated medication list for this problem includes:    Metoprolol Tartrate 100 Mg Tabs (Metoprolol tartrate) .Marland Kitchen... 1 1/2 tab by mouth two times a day    Methyldopa 250 Mg Tabs (Methyldopa) .Marland Kitchen... 1 by mouth bid    Aspirin 325 Mg Tabs (Aspirin) .Marland Kitchen... 1 tab by mouth once daily    Furosemide 40 Mg Tabs (Furosemide) .Marland Kitchen... Take one tablet by mouth daily.  Orders: EKG w/ Interpretation (93000) Cardiac Catheterization (Cardiac Cath)  Problem # 4:  HYPERLIPIDEMIA (ICD-272.4) Managed byDr. Eden Emms. Her updated medication list for this problem includes:    Pravastatin Sodium 20 Mg Tabs (Pravastatin sodium) ..... One by mouth at bedtime  Orders: EKG w/ Interpretation (93000) Cardiac  Catheterization (Cardiac Cath)  Patient Instructions: 1)  Your physician has requested that you have a cardiac catheterization.  Cardiac catheterization is used to diagnose and/or treat various heart conditions. Doctors may recommend this procedure for a number of different reasons. The most common reason is to evaluate chest pain. Chest pain can be a symptom of coronary artery disease (CAD), and cardiac catheterization can show whether plaque is narrowing or blocking your heart's arteries. This procedure is also used to evaluate the valves, as well as measure the blood flow and oxygen levels in different parts of your heart.  For further information please visit [email or web address]  Please follow instruction sheet, as given. 2)  Your physician recommends that you continue on your current medications as directed. Please refer to the Current Medication list given to you  today.

## 2010-04-02 NOTE — Progress Notes (Signed)
Summary: Cardiology Phone Note - Questions about cath tomorrow  Phone Note Call from Patient   Caller: Spouse Summary of Call: received call from pt [id number] w/ question as to whether or not we were going to go forward w/ cath tomorrow.  i spoke w dr. Riley Kill who advised that given the elective nature of this procedure and continued elevation of creat (1.8, down from 2.3 yesterday), that we will cancel the procedure for tomorrow and defer until creat. improves.  i called [name] back w this information and spoke to her husband.  i informed them that our office will be in touch to arrange for another bmet @ some point and subsequently PCI.  he was grateful for the call back and verbalized uderstanding. Initial call taken by: Creig Hines, ANP-BC,  September 05, 2009 6:17 PM

## 2010-04-02 NOTE — Miscellaneous (Signed)
Summary: [facility] Cardiac Physician Order/Treatment Plan   [facility] Cardiac Physician Order/Treatment Plan   Imported By: Roderic Ovens 09/07/2009 15:41:40  _____________________________________________________________________  External Attachment:    Type:   Image     Comment:   External Document

## 2010-04-02 NOTE — Assessment & Plan Note (Signed)
Summary: f11m/dm   Visit Type:  6 months follow up  CC:  No cardiac complains.  History of Present Illness: Jean [name] has a history of HTN, elevated lipids and CAD.;  She had a stent to the LAD in 2006 with a non-ischemic myovue in 2008.  She is not having any SSCP.  She is able to tolerate Pravastatin daily so long as she takes CoEnzyme Q.  She has been comliant with her meds and is not having angina.  She thinks the CoQ is making her aches better on statins.    Current Problems (verified): 1)  Ingrown Toenail  (ICD-703.0) 2)  Renal Insufficiency  (ICD-588.9) 3)  Vitamin D Deficiency  (ICD-268.9) 4)  Cramps,leg  (ICD-729.82) 5)  Cough  (ICD-786.2) 6)  Hypothyroidism, Unspecified  (ICD-244.9) 7)  Osteoarthritis  (ICD-715.90) 8)  Hypertension  (ICD-401.9) 9)  Hyperlipidemia  (ICD-272.4) 10)  Coronary Artery Disease  (ICD-414.00)  Current Medications (verified): 1)  Metoprolol Tartrate 100 Mg Tabs (Metoprolol Tartrate) .... 1/2 Tab By Mouth Two Times A Day 2)  Plavix 75 Mg Tabs (Clopidogrel Bisulfate) .Marland Kitchen.. 1 Qd 3)  Levothroid 75 Mcg Tabs (Levothyroxine Sodium) .... Qd 4)  Methyldopa 250 Mg  Tabs (Methyldopa) .Marland Kitchen.. 1 By Mouth Bid 5)  Meclizine Hcl 25 Mg  Tabs (Meclizine Hcl) .Marland Kitchen.. 1 Three Times A Day 6)  Prilosec Otc 20 Mg Tbec (Omeprazole Magnesium) .Marland Kitchen.. 1 By Mouth Qd 7)  Benazepril-Hydrochlorothiazide 20-25 Mg Tabs (Benazepril-Hydrochlorothiazide) .... Take 1 Tablet By Mouth Once A Day 8)  Vitamin D3 1000 Unit  Tabs (Cholecalciferol) .... 2 By Mouth Daily 9)  Fish Oil   Oil (Fish Oil) .Marland Kitchen.. 1 By Mouth Bid 10)  Pravastatin Sodium 20 Mg  Tabs (Pravastatin Sodium) .... One By Mouth At Bedtime 11)  Aspirin 81 Mg Tbec (Aspirin) .... Take One Tablet By Mouth Daily 12)  Coq10 100 Mg Caps (Coenzyme Q10) .... Once Daily  Allergies: 1)  ! Pcn 2)  Vytorin 3)  Crestor  Past History:  Past Medical History: Last updated: 12/13/2007 Coronary artery disease Hyperlipidemia Hypertension (nl BP  at home) Osteoarthritis Elev glu H/o CVA Vit D def Renal insufficiency  Past Surgical History: Last updated: 12/23/2006 Hysterectomy  Family History: Last updated: 04/14/2007 Family History Hypertension  Social History: Last updated: 06/13/2008 Retired Married Never Smoked Non-drinker  Review of Systems       Denies fever, malais, weight loss, blurry vision, decreased visual acuity, cough, sputum, SOB, hemoptysis, pleuritic pain, palpitaitons, heartburn, abdominal pain, melena, lower extremity edema, claudication, or rash.   Vital Signs:  Patient profile:   77 year old female Height:      67 inches Weight:      152.50 pounds BMI:     23.97 Pulse rate:   55 / minute Pulse rhythm:   regular Resp:     18 per minute BP sitting:   128 / 80  (left arm) Cuff size:   large  Vitals Entered By: Vikki Ports (May 29, 2009 10:44 AM)  Physical Exam  General:  Affect appropriate Healthy:  appears stated age HEENT: normal Neck supple with no adenopathy JVP normal no bruits no thyromegaly Lungs clear with no wheezing and good diaphragmatic motion Heart:  S1/S2 no murmur,rub, gallop or click PMI normal Abdomen: benighn, BS positve, no tenderness, no AAA no bruit.  No HSM or HJR Distal pulses intact with no bruits No edema Neuro non-focal Skin warm and dry    Impression & Recommendations:  Problem #  1:  HYPERTENSION (ICD-401.9) Well controlled low sodium diet Her updated medication list for this problem includes:    Metoprolol Tartrate 100 Mg Tabs (Metoprolol tartrate) .Marland Kitchen... 1/2 tab by mouth two times a day    Methyldopa 250 Mg Tabs (Methyldopa) .Marland Kitchen... 1 by mouth bid    Benazepril-hydrochlorothiazide 20-25 Mg Tabs (Benazepril-hydrochlorothiazide) .Marland Kitchen... Take 1 tablet by mouth once a day    Aspirin 81 Mg Tbec (Aspirin) .Marland Kitchen... Take one tablet by mouth daily  Problem # 2:  HYPERLIPIDEMIA (ICD-272.4) Continue statin labs per primary Her updated medication list for  this problem includes:    Pravastatin Sodium 20 Mg Tabs (Pravastatin sodium) ..... One by mouth at bedtime  CHOL: 225 (06/23/2008)   LDL: DEL (03/20/2008)   HDL: 40.10 (06/23/2008)   TG: 209.0 (06/23/2008)  Problem # 3:  CORONARY ARTERY DISEASE (ICD-414.00) Stable no angina continue ASA and BB Her updated medication list for this problem includes:    Metoprolol Tartrate 100 Mg Tabs (Metoprolol tartrate) .Marland Kitchen... 1/2 tab by mouth two times a day    Plavix 75 Mg Tabs (Clopidogrel bisulfate) .Marland Kitchen... 1 qd    Benazepril-hydrochlorothiazide 20-25 Mg Tabs (Benazepril-hydrochlorothiazide) .Marland Kitchen... Take 1 tablet by mouth once a day    Aspirin 81 Mg Tbec (Aspirin) .Marland Kitchen... Take one tablet by mouth daily  Patient Instructions: 1)  Your physician recommends that you schedule a follow-up appointment in: 6 MONTHS   EKG Report  Procedure date:  05/29/2009  Findings:      NSR 52 LAD LVH

## 2010-04-02 NOTE — Cardiovascular Report (Signed)
Summary: Pre-Cath Orders  Pre-Cath Orders   Imported By: Marylou Mccoy 10/09/2009 16:53:54  _____________________________________________________________________  External Attachment:    Type:   Image     Comment:   External Document

## 2010-04-02 NOTE — Assessment & Plan Note (Signed)
Summary: 4 MO ROV /NWS  #   Vital Signs:  Patient profile:   77 year old Jean [name] Height:      67 inches Weight:      146 pounds BMI:     22.95 O2 Sat:      97 % on Room air Temp:     98.3 degrees F oral Pulse rate:   58 / minute Pulse rhythm:   regular Resp:     Jean per minute BP sitting:   130 / 70  (left arm) Cuff size:   regular  Vitals Entered By: Lanier Prude, CMA(AAMA) (October 10, 2009 8:03 AM)  O2 Flow:  Room air CC: 4 mo f/u Comments pt is not taking furosemide or Kcl   Primary Care Provider:  Dr. Avel Sensor  CC:  4 mo f/u.  History of Present Illness: The patient presents for a post-hospital visit for      1. Coronary artery disease. a/p.  LAD stenting and PTCA of the circumflex artery August 21, 2009. b.  Cardiac catheterization and subsequent PCI with PROMUS DES to mid circumflex artery with 95% stenosis down to 0%, September 28, 2009. 1. Right groin bruit.     a.     Ultrasound of right groin without evidence of pseudoaneurysm      or fistula.  Moderate plaque noted in common femoral arteries. 2. CKD, stage III.     a.     Creatinine below baseline on day of discharge at 1.31, followup BMET scheduled at [facility] for October 01, 2009.   SECONDARY DIAGNOSES: 1. Hypertension. 2. Hyperlipidemia. 3. ICM/chronic systolic CHF, LVEF of 45%. 4. Hypothyroidism. 5. Mitral regurgitation, moderate. 6. Tricuspid regurgitation, moderate. 7. History of anemia requiring blood transfusions. 8. History of Klebsiella urinary tract infection (completed antibiotic     course on ciprofloxacin). 9. Osteoarthritis. 10.History of CVA. 11.History of vitamin D deficiency. 12.Hysterectomy. 13.History of back surgery.   2. Cardiac catheterization September 28, 2009:   Current Medications (verified): 1)  Metoprolol Tartrate 100 Mg Tabs (Metoprolol Tartrate) .Marland Kitchen.. 1 1/2 Tab By Mouth Two Times A Day 2)  Plavix 75 Mg Tabs (Clopidogrel Bisulfate) .Marland Kitchen.. 1 Qd 3)  Levothroid 75 Mcg  Tabs (Levothyroxine Sodium) .... Qd 4)  Methyldopa 250 Mg  Tabs (Methyldopa) .Marland Kitchen.. 1 By Mouth Bid 5)  Meclizine Hcl 25 Mg  Tabs (Meclizine Hcl) .Marland Kitchen.. 1 Three Times A Day 6)  Pravastatin Sodium 20 Mg  Tabs (Pravastatin Sodium) .... One By Mouth At Bedtime 7)  Aspirin 325 Mg  Tabs (Aspirin) .Marland Kitchen.. 1 Tab By Mouth Once Daily 8)  Furosemide 40 Mg Tabs (Furosemide) .... Take One Tablet By Mouth Daily. 9)  Nitrostat 0.4 Mg Subl (Nitroglycerin) .Marland Kitchen.. 1 Tablet Under Tongue At Onset of Chest Pain; You May Repeat Every 5 Minutes For Up To 3 Doses. 10)  Potassium Chloride Crys Cr 20 Meq Cr-Tabs (Potassium Chloride Crys Cr) .Marland Kitchen.. 1 Tab By Mouth Once Daily  Allergies (verified): 1)  ! Pcn 2)  Vytorin 3)  Crestor  Past History:  Past Medical History: Last updated: 08/28/2009  1. Coronary artery disease status post prior left anterior descending       stenting and percutaneous transluminal coronary angioplasty of the       circumflex this admission.   2. Hypertension.   3. Hyperlipidemia.   4. Hypothyroidism.   5. Ischemic cardiomyopathy/acute systolic congestive heart failure.       Ejection fraction of 45%.   6. Moderate  mitral regurgitation.   7. Moderate tricuspid regurgitation.   8. Anemia, status post 1 unit of packed red blood cells.   9. Klebsiella urinary tract infection, sensitive to Cipro.   10.Stage III chronic kidney disease with a creatinine of 1.57 today.  11.Osteoarthritis Elev glu H/o CVA Vit D def  Past Surgical History: Last updated: 08/28/2009  left anterior descending  stenting and percutaneous transluminal coronary angioplasty of the    circumflex Hysterectomy  Back surgery.   Social History: Last updated: 08/28/2009 Retired Married 60 years 2011 Never Smoked Non-drinker  Review of Systems  The patient denies fever, syncope, dyspnea on exertion, and abdominal pain.    Physical Exam  General:  Affect appropriate Healthy:  appears stated age HEENT: normal Neck  supple with no adenopathy JVP normal no bruits no thyromegaly Lungs clear with no wheezing and good diaphragmatic motion Heart:  S1/S2 no murmur,rub, gallop or click PMI normal Abdomen: benighn, BS positve, no tenderness, no AAA no bruit.  No HSM or HJR Distal pulses intact with no bruits No edema Neuro non-focal Skin warm and dry  Ears:  External ear exam shows no significant lesions or deformities.  Otoscopic examination reveals clear canals, tympanic membranes are intact bilaterally without bulging, retraction, inflammation or discharge. Hearing is grossly normal bilaterally. Nose:  External nasal examination shows no deformity or inflammation. Nasal mucosa are pink and moist without lesions or exudates. Mouth:  Oral mucosa and oropharynx without lesions or exudates.  Teeth in good repair. Neck:  No deformities, masses, or tenderness noted. Lungs:  Normal respiratory effort, chest expands symmetrically. Lungs are clear to auscultation, no crackles or wheezes. Heart:  Normal rate and regular rhythm. S1 and S2 normal without gallop, murmur, click, rub or other extra sounds. Abdomen:  Bowel sounds positive,abdomen soft and non-tender without masses, organomegaly or hernias noted. Msk:  No deformity or scoliosis noted of thoracic or lumbar spine.   Extremities:  No clubbing, cyanosis, edema, or deformity noted with normal full range of motion of all joints.   Neurologic:  No cranial nerve deficits noted. Station and gait are normal. Plantar reflexes are down-going bilaterally. DTRs are symmetrical throughout. Sensory, motor and coordinative functions appear intact. Skin:  Intact without suspicious lesions or rashes Moles on back R big toenail deformed and thick Psych:  Cognition and judgment appear intact. Alert and cooperative with normal attention span and concentration. No apparent delusions, illusions, hallucinations   Impression & Recommendations:  Problem # 1:  CORONARY ARTERY  DISEASE (ICD-414.00) Assessment Deteriorated S/p STENT Her updated medication list for this problem includes:    Metoprolol Tartrate 100 Mg Tabs (Metoprolol tartrate) .Marland Kitchen... 1 1/2 tab by mouth two times a day    Plavix 75 Mg Tabs (Clopidogrel bisulfate) .Marland Kitchen... 1 qd    Methyldopa 250 Mg Tabs (Methyldopa) .Marland Kitchen... 1 by mouth bid    Aspirin 325 Mg Tabs (Aspirin) .Marland Kitchen... 1 tab by mouth once daily    Furosemide 40 Mg Tabs (Furosemide) .Marland Kitchen... Take one tablet by mouth daily.    Nitrostat 0.4 Mg Subl (Nitroglycerin) .Marland Kitchen... 1 tablet under tongue at onset of chest pain; you may repeat every 5 minutes for up to 3 doses.  Problem # 2:  CRAMPS,LEG (ICD-729.82) Assessment: Improved  Problem # 3:  RENAL INSUFFICIENCY (ICD-588.9) Assessment: Unchanged The labs were reviewed with the patient.   Problem # 4:  HYPERLIPIDEMIA (ICD-272.4) Assessment: Unchanged  Her updated medication list for this problem includes:    Pravastatin Sodium 20 Mg  Tabs (Pravastatin sodium) ..... One by mouth at bedtime  Complete Medication List: 1)  Metoprolol Tartrate 100 Mg Tabs (Metoprolol tartrate) .Marland Kitchen.. 1 1/2 tab by mouth two times a day 2)  Plavix 75 Mg Tabs (Clopidogrel bisulfate) .Marland Kitchen.. 1 qd 3)  Levothroid 75 Mcg Tabs (Levothyroxine sodium) .... Qd 4)  Methyldopa 250 Mg Tabs (Methyldopa) .Marland Kitchen.. 1 by mouth bid 5)  Meclizine Hcl 25 Mg Tabs (Meclizine hcl) .Marland Kitchen.. 1 three times a day 6)  Pravastatin Sodium 20 Mg Tabs (Pravastatin sodium) .... One by mouth at bedtime 7)  Aspirin 325 Mg Tabs (Aspirin) .Marland Kitchen.. 1 tab by mouth once daily 8)  Furosemide 40 Mg Tabs (Furosemide) .... Take one tablet by mouth daily. 9)  Nitrostat 0.4 Mg Subl (Nitroglycerin) .Marland Kitchen.. 1 tablet under tongue at onset of chest pain; you may repeat every 5 minutes for up to 3 doses. 10)  Potassium Chloride Crys Cr 20 Meq Cr-tabs (Potassium chloride crys cr) .Marland Kitchen.. 1 tab by mouth once daily  Other Orders: Pneumococcal Vaccine (18841) Admin 1st Vaccine (66063)  Patient  Instructions: 1)  Please schedule a follow-up appointment in 3 months. 2)  BMP prior to visit, ICD-9: 3)  Hepatic Panel prior to visit, ICD-9: 4)  Lipid Panel prior to visit, ICD-9:995.20   Immunizations Administered:  Pneumonia Vaccine:    Vaccine Type: Pneumovax    Site: left deltoid    Mfr: Merck    Dose: 0.5 ml    Route: IM    Given by: Lanier Prude, CMA(AAMA)    Exp. Date: 03/20/2011    Lot #: [id number]    VIS given: 09/29/95 version given October 10, 2009.

## 2010-04-02 NOTE — Progress Notes (Signed)
Summary: Question about going to the beach  Phone Note Call from Patient Call back at Home Phone [phone]   Caller: Patient Summary of Call: Question about going to the beach due to  having a procedure Initial call taken by: Judie Grieve,  September 12, 2009 4:51 PM  Follow-up for Phone Call        The pt wants to go to [facility] next week.  They will be close to [facility]. I told them that it would be okay to go to the beach.  If the pt has a problem she will go to the ER for evaluation.  Follow-up by: Julieta Gutting, RN, BSN,  September 12, 2009 4:58 PM     Appended Document: Question about going to the beach Also called and spoke with the patient's husband.  She is having no symptoms at this time.  She prefers that I do this, and I explained to her that it could be done by any of my partners.  However, she would prefer to wait.  I again explained the options about continued medical therapy.  They will be at [facility], and with any new symptoms they will go to the hospital.  She will have followup labs on the 25th, and we will rediscuss at that time. TS

## 2010-04-02 NOTE — Letter (Signed)
Summary: Cardiac Catheterization Instructions- Main Lab  [employer], Main Office  [address]   [place], [place] [address]   Phone: [phone]  Fax: [phone]     11/12/2009 MRN: [id number]  [facility] [address] [place], [place]  [address]  Dear Ms. Savidge,   You are scheduled for percutaneous coronary intervention on  Wednesday September 21,11 with Dr. Riley Kill            .  Please arrive at the [facility] at 6:30     a.m.on the day of your procedure.  1. DIET     ___x_ Nothing to eat or drink after midnight except your medications with a sip of water.  2. Come to the [place] office on Thursday September 15,11 for lab work.  The lab at [facility] is open from 8:30 a.m. to 1:30 p.m. and 2:30 p.m. to 5:00 p.m.  The lab at [facility] is open from 7:30 a.m. to 5:30 p.m.  You do not have to be fasting.  3. MAKE SURE YOU TAKE YOUR ASPIRIN.       __x__ YOU MAY TAKE ALL of your remaining medications with a small amount of water.  4. Plan for one night stay - bring personal belongings (i.e. toothpaste, toothbrush, etc.)  5. Bring a current list of your medications and current insurance cards.  6. Must have a responsible person to drive you home.   8. Someone must be with you for the first 24 hours after you arrive home.  9. Please wear clothes that are easy to get on and off and wear slip-on shoes.  *Special note: Every effort is made to have your procedure done on time.  Occasionally there are emergencies that present themselves at the hospital that may cause delays.  Please be patient if a delay does occur.  If you have any questions after you get home, please call the office at the number listed above.  Dr. Shawnie Pons Lisabeth Devoid RN

## 2010-04-04 NOTE — Progress Notes (Signed)
Summary: MED REQUEST  Phone Note Call from Patient Call back at Home Phone [phone]   Caller: Patient Reason for Call: Refill Medication, Talk to Nurse Summary of Call: REQUEST REFILL ON METOPROLOL TARTRATE 75MG  2X DAILY; PLAVIX 75MG ; PLEASE SEND A 2 WEEKS WORTH TO [facility] [address], THEN 90DAY SUPPLY TO [facility] Initial call taken by: Migdalia Dk,  February 18, 2010 9:43 AM  Follow-up for Phone Call        Rf sent. pt informed. Follow-up by: Lanier Prude, [facility]),  February 18, 2010 4:38 PM    Prescriptions: METOPROLOL TARTRATE 100 MG TABS (METOPROLOL TARTRATE) 1 1/2 tab by mouth two times a day  #270 x 2   Entered by:   Lanier Prude, CMA(AAMA)   Authorized by:   Tresa Garter MD   Signed by:   Lanier Prude, CMA(AAMA) on 02/18/2010   Method used:   Electronically to        [facility]* (retail)             ,          Ph: [phone]       Fax: [phone]   RxID:   [id number] PLAVIX 75 MG TABS (CLOPIDOGREL BISULFATE) 1 qd  #14 x 0   Entered by:   Lanier Prude, CMA(AAMA)   Authorized by:   Tresa Garter MD   Signed by:   Lanier Prude, CMA(AAMA) on 02/18/2010   Method used:   [facility]  [address] [id number] ([facility]ail)       [address]       [place], [place]  [address]       Ph: [phone] or [phone]       Fax: [phone]   RxID:   [id number] METOPROLOL TARTRATE 100 MG TABS (METOPROLOL TARTRATE) 1 1/2 tab by mouth two times a day  #22 x 0   Entered by:   Lanier Prude, CMA(AAMA)   Authorized by:   Tresa Garter MD   Signed by:   Lanier Prude, CMA(AAMA) on 02/18/2010   Method used:   [facility]  [address] [id number] ([facility]ail)       [address]       [place], [place]  [address]       Ph: [phone] or [phone]       Fax: [phone]   RxID:   [id number] PLAVIX 75 MG TABS (CLOPIDOGREL BISULFATE) 1 qd  #90 Tablet x 2  Entered by:   Lanier Prude, CMA(AAMA)   Authorized by:   Tresa Garter MD   Signed by:   Lanier Prude, [facility]) on 02/18/2010   Method used:   Electronically to        [facility]* (retail)             ,          Ph: [phone]       Fax: [phone]   RxID:   [id number] METOPROLOL TARTRATE 100 MG TABS (METOPROLOL TARTRATE) 1 1/2 tab by mouth two times a day  #135 x 2   Entered by:   Lanier Prude, CMA(AAMA)   Authorized by:   Tresa Garter MD   Signed by:   Lanier Prude, CMA(AAMA) on 02/18/2010   Method used:   Electronically to  [facility]* (retail)             ,          Ph: [phone]       Fax: [phone]   RxID:   [id number]

## 2010-04-04 NOTE — Progress Notes (Signed)
  Phone Note From Other Clinic   Caller: Dr Eden Emms Summary of Call: To  sch breast MRI  Follow-up for Phone Call        MRI denied - needs mammo first Follow-up by: Tresa Garter MD,  March 18, 2010 7:58 AM

## 2010-04-04 NOTE — Assessment & Plan Note (Signed)
Summary: F3M/DM   Primary Provider:  Dr. Avel Sensor  CC:  check up.  History of Present Illness: Jean [name] is seen today post LAD intervention 9/11.  She has had recent circ infarct with restenosis and stenting 6/11 with staged intervention to LAD 3 months latter.  EF is normal.  No angina and compliant with antiplatlet drugs. Leg has healed well.  Ambulatorywith no dyspnea, palpitations edema or syncope.  CRF's well modified and on statin  She needs F/U assesment of fibrocyctic breast disease but cannot have a mamogram as they are too painful.  Will try to arrange for her to have and MRI and F/U with Dr Paulette Blanch.  Current Problems (verified): 1)  Fibrocystic Breast Disease  (ICD-610.1) 2)  Coronary Atherosclerosis Native Coronary Artery  (ICD-414.01) 3)  Syncope  (ICD-780.2) 4)  Gastroenteritis  (ICD-558.9) 5)  Ingrown Toenail  (ICD-703.0) 6)  Renal Insufficiency  (ICD-588.9) 7)  Vitamin D Deficiency  (ICD-268.9) 8)  Cramps,leg  (ICD-729.82) 9)  Cough  (ICD-786.2) 10)  Hypothyroidism, Unspecified  (ICD-244.9) 11)  Osteoarthritis  (ICD-715.90) 12)  Hypertension  (ICD-401.9) 13)  Hyperlipidemia  (ICD-272.4) 14)  Coronary Artery Disease  (ICD-414.00)  Current Medications (verified): 1)  Metoprolol Tartrate 100 Mg Tabs (Metoprolol Tartrate) .Marland Kitchen.. 1 1/2 Tab By Mouth Two Times A Day 2)  Plavix 75 Mg Tabs (Clopidogrel Bisulfate) .Marland Kitchen.. 1 Qd 3)  Levothroid 75 Mcg Tabs (Levothyroxine Sodium) .... Qd 4)  Methyldopa 250 Mg  Tabs (Methyldopa) .Marland Kitchen.. 1 By Mouth Two Times A Day` 5)  Meclizine Hcl 25 Mg  Tabs (Meclizine Hcl) .Marland Kitchen.. 1 Three Times A Day 6)  Pravastatin Sodium 20 Mg  Tabs (Pravastatin Sodium) .Marland Kitchen.. 1 By Mouth Bid 7)  Aspirin 325 Mg  Tabs (Aspirin) .Marland Kitchen.. 1 Tab By Mouth Once Daily 8)  Nitrostat 0.4 Mg Subl (Nitroglycerin) .Marland Kitchen.. 1 Tablet Under Tongue At Onset of Chest Pain; You May Repeat Every 5 Minutes For Up To 3 Doses. 9)  Co Q 10 .... Daily  Allergies (verified): 1)  ! Pcn 2)  Vytorin 3)   Crestor  Past History:  Past Medical History: Last updated: 08/28/2009  1. Coronary artery disease status post prior left anterior descending       stenting and percutaneous transluminal coronary angioplasty of the       circumflex this admission.   2. Hypertension.   3. Hyperlipidemia.   4. Hypothyroidism.   5. Ischemic cardiomyopathy/acute systolic congestive heart failure.       Ejection fraction of 45%.   6. Moderate mitral regurgitation.   7. Moderate tricuspid regurgitation.   8. Anemia, status post 1 unit of packed red blood cells.   9. Klebsiella urinary tract infection, sensitive to Cipro.   10.Stage III chronic kidney disease with a creatinine of 1.57 today.  11.Osteoarthritis Elev glu H/o CVA Vit D def  Past Surgical History: Last updated: 01/15/2010  left anterior descending  stenting and percutaneous transluminal coronary angioplasty of the    circumflex Hysterectomy  Back surgery.  Coronary artery disease status post PROMUS drug-eluting stent     placement to the mid and distal left anterior descending (coronary     artery), on either side of the previously placed/widely patent     Cypher stent November 21, 2009.  Family History: Last updated: 08/28/2009 Family History Hypertension   Her mother died of a brain aneurysm, father with an MI   in the 82s.      Social History: Last updated: 08/28/2009 Retired Married 60  years 2011 Never Smoked Non-drinker  Review of Systems       Denies fever, malais, weight loss, blurry vision, decreased visual acuity, cough, sputum, SOB, hemoptysis, pleuritic pain, palpitaitons, heartburn, abdominal pain, melena, lower extremity edema, claudication, or rash.   Vital Signs:  Patient profile:   77 year old female Height:      67 inches Weight:      143 pounds BMI:     22.48 Pulse rate:   55 / minute Resp:     14 per minute BP sitting:   160 / 78  (left arm)  Vitals Entered By: [name] (March 14, 2010  9:07 AM)  Physical Exam  General:  Affect appropriate Healthy:  appears stated age HEENT: normal Neck supple with no adenopathy JVP normal no bruits no thyromegaly Lungs clear with no wheezing and good diaphragmatic motion Heart:  S1/S2 no murmur,rub, gallop or click PMI normal Abdomen: benighn, BS positve, no tenderness, no AAA no bruit.  No HSM or HJR Distal pulses intact with no bruits No edema Neuro non-focal Skin warm and dry    Impression & Recommendations:  Problem # 1:  FIBROCYSTIC BREAST DISEASE (ICD-610.1) MRI and F/U with [facility] Orders: MRI (MRI)  Problem # 2:  CORONARY ATHEROSCLEROSIS NATIVE CORONARY ARTERY (ICD-414.01) Stable no angina Her updated medication list for this problem includes:    Metoprolol Tartrate 100 Mg Tabs (Metoprolol tartrate) .Marland Kitchen... 1 1/2 tab by mouth two times a day    Plavix 75 Mg Tabs (Clopidogrel bisulfate) .Marland Kitchen... 1 qd    Aspirin 325 Mg Tabs (Aspirin) .Marland Kitchen... 1 tab by mouth once daily    Nitrostat 0.4 Mg Subl (Nitroglycerin) .Marland Kitchen... 1 tablet under tongue at onset of chest pain; you may repeat every 5 minutes for up to 3 doses.  Problem # 3:  HYPERTENSION (ICD-401.9) Improved on current dose of [place] Her updated medication list for this problem includes:    Metoprolol Tartrate 100 Mg Tabs (Metoprolol tartrate) .Marland Kitchen... 1 1/2 tab by mouth two times a day    Methyldopa 250 Mg Tabs (Methyldopa) .Marland Kitchen... 1 by mouth two times a day`    Aspirin 325 Mg Tabs (Aspirin) .Marland Kitchen... 1 tab by mouth once daily  Problem # 4:  HYPERLIPIDEMIA (ICD-272.4) At goal continue statin Her updated medication list for this problem includes:    Pravastatin Sodium 20 Mg Tabs (Pravastatin sodium) .Marland Kitchen... 1 by mouth bid  CHOL: 187 (01/11/2010)   LDL: 101 (01/11/2010)   HDL: 51.60 (01/11/2010)   TG: 173.0 (01/11/2010)  Patient Instructions: 1)  Your physician wants you to follow-up in:6 MONTHS   You will receive a reminder letter in the mail two months in advance. If you  don't receive a letter, please call our office to schedule the follow-up appointment. 2)  MRI OF THE BREAST-FOR FIBROCYSTIC BREAST DISEASE

## 2010-04-04 NOTE — Progress Notes (Signed)
Summary: question on meds  Phone Note Call from Patient Call back at Home Phone [phone]   Caller: Patient Reason for Call: Talk to Nurse Summary of Call: question on meds.  Initial call taken by: Roe Coombs,  February 14, 2010 8:16 AM  Follow-up for Phone Call        spoke with pt, she was seen by primary care in nov. at that time her bp was elevated and her methyldopa was increased from two times a day to three times a day. she has noticed since that time she is having trouble with aching all over and she is getting headaches. she questioned if she could decrease back to two times a day . okay given for pt to decrease, she will keep track of her bp and bring the list to her appt with dr Eden Emms in [place]. she will call if symptoms do not improve after decreasing dose Deliah Goody, RN  February 14, 2010 10:19 AM     New/Updated Medications: METHYLDOPA 250 MG  TABS (METHYLDOPA) 1 by mouth two times a day`

## 2010-04-10 NOTE — Progress Notes (Signed)
Summary: mammogram  Phone Note Call from Patient   Caller: [facility] [place] [facility] [phone] Summary of Call: [facility] center called regarding the order that was sent for a diagnostic mammogram. She states that had a past mammogram thatt showed some calcification but had since then been removed. She wonder if patient just needs a screening or should the order continue as a diagnostic one. Please advise Thanks.Alvy Beal Archie CMA  April 02, 2010 1:50 PM   Follow-up for Phone Call        Diagnostic mammo  pls Follow-up by: Tresa Garter MD,  April 02, 2010 5:41 PM  Additional Follow-up for Phone Call Additional follow up Details #1::        Sarah at [facility] made aware Additional Follow-up by: Margaret Pyle, CMA,  April 03, 2010 10:40 AM

## 2010-04-11 ENCOUNTER — Other Ambulatory Visit: Payer: Self-pay

## 2010-04-11 ENCOUNTER — Encounter: Payer: Self-pay | Admitting: Internal Medicine

## 2010-04-11 LAB — HM MAMMOGRAPHY: HM Mammogram: NORMAL

## 2010-04-26 ENCOUNTER — Ambulatory Visit: Payer: Self-pay | Admitting: Internal Medicine

## 2010-05-06 ENCOUNTER — Ambulatory Visit: Payer: Self-pay | Admitting: Internal Medicine

## 2010-05-08 ENCOUNTER — Encounter: Payer: Self-pay | Admitting: Internal Medicine

## 2010-05-10 ENCOUNTER — Other Ambulatory Visit: Payer: Self-pay | Admitting: Internal Medicine

## 2010-05-10 ENCOUNTER — Encounter (INDEPENDENT_AMBULATORY_CARE_PROVIDER_SITE_OTHER): Payer: Self-pay | Admitting: *Deleted

## 2010-05-10 ENCOUNTER — Other Ambulatory Visit: Payer: Medicare Other

## 2010-05-10 DIAGNOSIS — I2589 Other forms of chronic ischemic heart disease: Secondary | ICD-10-CM

## 2010-05-10 DIAGNOSIS — T887XXA Unspecified adverse effect of drug or medicament, initial encounter: Secondary | ICD-10-CM

## 2010-05-10 LAB — TSH: TSH: 2.14 u[IU]/mL (ref 0.35–5.50)

## 2010-05-10 LAB — LIPID PANEL
HDL: 53.7 mg/dL (ref >39.00)
LDL Cholesterol: 101 mg/dL — ABNORMAL HIGH (ref 0–99)
Total CHOL/HDL Ratio: 3
Triglycerides: 138 mg/dL (ref 0.0–149.0)
VLDL: 27.6 mg/dL (ref 0.0–40.0)

## 2010-05-10 LAB — HEPATIC FUNCTION PANEL
Bilirubin, Direct: 0.1 mg/dL (ref 0.0–0.3)
Total Bilirubin: 0.3 mg/dL (ref 0.3–1.2)

## 2010-05-10 LAB — BASIC METABOLIC PANEL
Calcium: 9.4 mg/dL (ref 8.4–10.5)
Chloride: 108 mEq/L (ref 96–112)
Creatinine, Ser: 1.4 mg/dL — ABNORMAL HIGH (ref 0.4–1.2)

## 2010-05-14 ENCOUNTER — Other Ambulatory Visit: Payer: Self-pay | Admitting: Internal Medicine

## 2010-05-14 ENCOUNTER — Ambulatory Visit (INDEPENDENT_AMBULATORY_CARE_PROVIDER_SITE_OTHER): Payer: Medicare Other | Admitting: Internal Medicine

## 2010-05-14 ENCOUNTER — Ambulatory Visit (INDEPENDENT_AMBULATORY_CARE_PROVIDER_SITE_OTHER)
Admission: RE | Admit: 2010-05-14 | Discharge: 2010-05-14 | Disposition: A | Discharge status: Home / Self Care | Payer: Medicare Other | Source: Ambulatory Visit | Attending: Internal Medicine | Admitting: Internal Medicine

## 2010-05-14 ENCOUNTER — Encounter: Payer: Self-pay | Admitting: Internal Medicine

## 2010-05-14 DIAGNOSIS — M25579 Pain in unspecified ankle and joints of unspecified foot: Secondary | ICD-10-CM

## 2010-05-14 DIAGNOSIS — M25539 Pain in unspecified wrist: Secondary | ICD-10-CM

## 2010-05-14 DIAGNOSIS — I2510 Atherosclerotic heart disease of native coronary artery without angina pectoris: Secondary | ICD-10-CM

## 2010-05-14 DIAGNOSIS — E039 Hypothyroidism, unspecified: Secondary | ICD-10-CM

## 2010-05-14 NOTE — Miscellaneous (Signed)
Summary: Mammogram 2012  Clinical Lists Changes  Observations: Added new observation of MAMMOGRAM: normal (04/11/2010 9:12)      Preventive Care Screening  Mammogram:    Date:  04/11/2010    Results:  normal

## 2010-05-16 ENCOUNTER — Ambulatory Visit (INDEPENDENT_AMBULATORY_CARE_PROVIDER_SITE_OTHER): Payer: Medicare Other | Admitting: Family Medicine

## 2010-05-16 ENCOUNTER — Encounter: Payer: Self-pay | Admitting: Family Medicine

## 2010-05-16 ENCOUNTER — Telehealth: Payer: Self-pay | Admitting: Internal Medicine

## 2010-05-16 DIAGNOSIS — S82899A Other fracture of unspecified lower leg, initial encounter for closed fracture: Secondary | ICD-10-CM

## 2010-05-16 DIAGNOSIS — S93409A Sprain of unspecified ligament of unspecified ankle, initial encounter: Secondary | ICD-10-CM

## 2010-05-16 DIAGNOSIS — M25579 Pain in unspecified ankle and joints of unspecified foot: Secondary | ICD-10-CM

## 2010-05-16 LAB — CBC
Hemoglobin: 10.9 g/dL — ABNORMAL LOW (ref 12.0–15.0)
MCH: 28.6 pg (ref 26.0–34.0)
Platelets: 163 10*3/uL (ref 150–400)
RBC: 3.81 MIL/uL — ABNORMAL LOW (ref 3.87–5.11)
WBC: 6.1 10*3/uL (ref 4.0–10.5)

## 2010-05-16 LAB — BASIC METABOLIC PANEL
CO2: 22 mEq/L (ref 19–32)
Calcium: 8.7 mg/dL (ref 8.4–10.5)
Creatinine, Ser: 1.21 mg/dL — ABNORMAL HIGH (ref 0.4–1.2)
GFR calc Af Amer: 52 mL/min — ABNORMAL LOW (ref >60)
GFR calc non Af Amer: 43 mL/min — ABNORMAL LOW (ref >60)
Sodium: 137 mEq/L (ref 135–145)

## 2010-05-16 LAB — PLATELET INHIBITION P2Y12
P2Y12 % Inhibition: 51 %
Platelet Function Baseline: 363 [PRU] (ref 194–418)

## 2010-05-18 LAB — CBC
MCH: 30.3 pg (ref 26.0–34.0)
Platelets: 204 10*3/uL (ref 150–400)
RBC: 3.47 MIL/uL — ABNORMAL LOW (ref 3.87–5.11)
WBC: 5 10*3/uL (ref 4.0–10.5)

## 2010-05-18 LAB — BASIC METABOLIC PANEL
CO2: 23 mEq/L (ref 19–32)
Calcium: 8.9 mg/dL (ref 8.4–10.5)
Calcium: 9.1 mg/dL (ref 8.4–10.5)
Creatinine, Ser: 1.31 mg/dL — ABNORMAL HIGH (ref 0.4–1.2)
Creatinine, Ser: 1.43 mg/dL — ABNORMAL HIGH (ref 0.4–1.2)
GFR calc Af Amer: 43 mL/min — ABNORMAL LOW (ref >60)
GFR calc Af Amer: 47 mL/min — ABNORMAL LOW (ref >60)
GFR calc non Af Amer: 35 mL/min — ABNORMAL LOW (ref >60)

## 2010-05-18 LAB — MRSA PCR SCREENING: MRSA by PCR: NEGATIVE

## 2010-05-18 LAB — PLATELET INHIBITION P2Y12: P2Y12 % Inhibition: 48 %

## 2010-05-19 LAB — BASIC METABOLIC PANEL WITH GFR
BUN: 34 mg/dL — ABNORMAL HIGH (ref 6–23)
CO2: 20 meq/L (ref 19–32)
Calcium: 8.7 mg/dL (ref 8.4–10.5)
Chloride: 106 meq/L (ref 96–112)
Creatinine, Ser: 1.81 mg/dL — ABNORMAL HIGH (ref 0.4–1.2)
GFR calc non Af Amer: 27 mL/min — ABNORMAL LOW
Glucose, Bld: 139 mg/dL — ABNORMAL HIGH (ref 70–99)
Potassium: 4.4 meq/L (ref 3.5–5.1)
Sodium: 134 meq/L — ABNORMAL LOW (ref 135–145)

## 2010-05-19 LAB — CBC
HCT: 31.4 % — ABNORMAL LOW (ref 36.0–46.0)
HCT: 34.4 % — ABNORMAL LOW (ref 36.0–46.0)
Hemoglobin: 10.7 g/dL — ABNORMAL LOW (ref 12.0–15.0)
Hemoglobin: 10.8 g/dL — ABNORMAL LOW (ref 12.0–15.0)
Hemoglobin: 11.8 g/dL — ABNORMAL LOW (ref 12.0–15.0)
Hemoglobin: 9.4 g/dL — ABNORMAL LOW (ref 12.0–15.0)
Hemoglobin: 11.3 g/dL — ABNORMAL LOW (ref 12.0–15.0)
MCH: 31 pg (ref 26.0–34.0)
MCHC: 33.9 g/dL (ref 30.0–36.0)
MCHC: 33.9 g/dL (ref 30.0–36.0)
MCHC: 34.2 g/dL (ref 30.0–36.0)
MCHC: 34.2 g/dL (ref 30.0–36.0)
MCHC: 32.8 g/dL (ref 30.0–36.0)
MCV: 90.9 fL (ref 78.0–100.0)
MCV: 90.9 fL (ref 78.0–100.0)
MCV: 91.3 fL (ref 78.0–100.0)
MCV: 91.4 fL (ref 78.0–100.0)
Platelets: 168 10*3/uL (ref 150–400)
RBC: 3.46 MIL/uL — ABNORMAL LOW (ref 3.87–5.11)
RBC: 3.8 MIL/uL — ABNORMAL LOW (ref 3.87–5.11)
RDW: 12.7 % (ref 11.5–15.5)
RDW: 13.2 % (ref 11.5–15.5)
RDW: 13.2 % (ref 11.5–15.5)
RDW: 13 % (ref 11.5–15.5)
WBC: 11.5 10*3/uL — ABNORMAL HIGH (ref 4.0–10.5)

## 2010-05-19 LAB — BASIC METABOLIC PANEL
BUN: 22 mg/dL (ref 6–23)
BUN: 22 mg/dL (ref 6–23)
BUN: 38 mg/dL — ABNORMAL HIGH (ref 6–23)
CO2: 20 mEq/L (ref 19–32)
CO2: 24 mEq/L (ref 19–32)
CO2: 24 mEq/L (ref 19–32)
CO2: 24 mEq/L (ref 19–32)
CO2: 23 mEq/L (ref 19–32)
Calcium: 8.4 mg/dL (ref 8.4–10.5)
Calcium: 8.6 mg/dL (ref 8.4–10.5)
Calcium: 8.9 mg/dL (ref 8.4–10.5)
Calcium: 9.5 mg/dL (ref 8.4–10.5)
Chloride: 103 mEq/L (ref 96–112)
Chloride: 108 mEq/L (ref 96–112)
Chloride: 109 mEq/L (ref 96–112)
Creatinine, Ser: 1.57 mg/dL — ABNORMAL HIGH (ref 0.4–1.2)
GFR calc Af Amer: 38 mL/min — ABNORMAL LOW (ref >60)
GFR calc non Af Amer: 32 mL/min — ABNORMAL LOW (ref >60)
Glucose, Bld: 101 mg/dL — ABNORMAL HIGH (ref 70–99)
Glucose, Bld: 111 mg/dL — ABNORMAL HIGH (ref 70–99)
Glucose, Bld: 112 mg/dL — ABNORMAL HIGH (ref 70–99)
Glucose, Bld: 120 mg/dL — ABNORMAL HIGH (ref 70–99)
Glucose, Bld: 148 mg/dL — ABNORMAL HIGH (ref 70–99)
Potassium: 3.9 mEq/L (ref 3.5–5.1)
Sodium: 136 mEq/L (ref 135–145)
Sodium: 137 mEq/L (ref 135–145)
Sodium: 137 mEq/L (ref 135–145)
Sodium: 138 mEq/L (ref 135–145)
Sodium: 139 mEq/L (ref 135–145)

## 2010-05-19 LAB — POCT CARDIAC MARKERS
CKMB, poc: 9 ng/mL (ref 1.0–8.0)
Myoglobin, poc: 233 ng/mL (ref 12–200)
Myoglobin, poc: 333 ng/mL (ref 12–200)

## 2010-05-19 LAB — URINE MICROSCOPIC-ADD ON

## 2010-05-19 LAB — ABO/RH: ABO/RH(D): B POS

## 2010-05-19 LAB — MAGNESIUM: Magnesium: 1.5 mg/dL (ref 1.5–2.5)

## 2010-05-19 LAB — LIPID PANEL
HDL: 45 mg/dL
Total CHOL/HDL Ratio: 4.5 ratio
Triglycerides: 186 mg/dL — ABNORMAL HIGH
VLDL: 37 mg/dL (ref 0–40)

## 2010-05-19 LAB — MRSA PCR SCREENING: MRSA by PCR: NEGATIVE

## 2010-05-19 LAB — CARDIAC PANEL(CRET KIN+CKTOT+MB+TROPI)
CK, MB: 36 ng/mL (ref 0.3–4.0)
CK, MB: 41.4 ng/mL (ref 0.3–4.0)
CK, MB: 42.5 ng/mL (ref 0.3–4.0)
Relative Index: 24 — ABNORMAL HIGH (ref 0.0–2.5)
Relative Index: 24.5 — ABNORMAL HIGH (ref 0.0–2.5)
Relative Index: 26.7 — ABNORMAL HIGH (ref 0.0–2.5)
Total CK: 150 U/L (ref 7–177)
Total CK: 159 U/L (ref 7–177)
Total CK: 169 U/L (ref 7–177)
Total CK: 247 U/L — ABNORMAL HIGH (ref 7–177)
Troponin I: 4.17 ng/mL (ref 0.00–0.06)

## 2010-05-19 LAB — COMPREHENSIVE METABOLIC PANEL
Albumin: 3.3 g/dL — ABNORMAL LOW (ref 3.5–5.2)
BUN: 37 mg/dL — ABNORMAL HIGH (ref 6–23)
CO2: 24 mEq/L (ref 19–32)
Chloride: 108 mEq/L (ref 96–112)
Creatinine, Ser: 1.74 mg/dL — ABNORMAL HIGH (ref 0.4–1.2)
GFR calc non Af Amer: 28 mL/min — ABNORMAL LOW (ref >60)
Glucose, Bld: 112 mg/dL — ABNORMAL HIGH (ref 70–99)
Total Bilirubin: 0.5 mg/dL (ref 0.3–1.2)

## 2010-05-19 LAB — CROSSMATCH: Antibody Screen: NEGATIVE

## 2010-05-19 LAB — URINE CULTURE: Colony Count: >=100000

## 2010-05-19 LAB — PROTIME-INR: INR: 1.09 (ref 0.00–1.49)

## 2010-05-19 LAB — TSH: TSH: 0.733 u[IU]/mL (ref 0.350–4.500)

## 2010-05-19 LAB — APTT: aPTT: 29 seconds (ref 24–37)

## 2010-05-19 LAB — URINALYSIS, ROUTINE W REFLEX MICROSCOPIC
Glucose, UA: NEGATIVE mg/dL
Hgb urine dipstick: NEGATIVE
Specific Gravity, Urine: 1.018 (ref 1.005–1.030)
pH: 5.5 (ref 5.0–8.0)

## 2010-05-21 NOTE — Assessment & Plan Note (Signed)
Summary: 2:00 pm ankle fracture per dr Chinara Hertzberg jrt   Vital Signs:  Patient profile:   77 year old female Height:      67 inches Weight:      147.50 pounds BMI:     23.19 Temp:     97.8 degrees F oral Pulse rate:   64 / minute Pulse rhythm:   regular BP sitting:   160 / 70  (left arm) Cuff size:   regular  Vitals Entered By: Benny Lennert CMA Duncan Dull) (May 16, 2010 2:06 PM)  Primary Care Provider:  Dr. Avel Sensor   History of Present Illness: Chief complaint fracture  Dr. Posey Rea requested a consult for evaluation of right ankle pain:  DOI: 05/10/2010  Got up and turned around to answer the phone while she was seated at home, and her foot buckled in some way underneath her, and she fell. She states that at that time, she felt like her foot was asleep, and had  minimal sensation,  so she is unclear whether or not she inverted or everted her ankle.  She did have a significant amount of pain at the time, however, and actually sat on the floor for some time  until she was able to stand up and ambulate.   She immediately had some swelling, in the anterior ankle  as well as laterally.  Currently, she has some significant bruising.  She saw Dr. Posey Rea 05/14/2010, and she is currently wearing an Ace bandage.  She has noted some diminished swelling, and she has been propping up her leg. She has been able to ambulate with some degree of gait disturbance.  X-ray, Ankle: AP, Lateral, and Mortise Views, RIGHT, independently reviewed and reviewed with patient. Indication: Ankle pain Findings: There is evidence of an anterior talus avulsion fracture. talar dome  appears preserved without cortical disruption. There is no other evidence of occult fracture aside from this one injury. The mortise appears preserved.   Allergies: 1)  ! Pcn 2)  Vytorin 3)  Crestor  Past History:  Past medical, surgical, family and social histories (including risk factors) reviewed, and no changes noted  (except as noted below).  Past Medical History: Reviewed history from 08/28/2009 and no changes required.  1. Coronary artery disease status post prior left anterior descending       stenting and percutaneous transluminal coronary angioplasty of the       circumflex this admission.   2. Hypertension.   3. Hyperlipidemia.   4. Hypothyroidism.   5. Ischemic cardiomyopathy/acute systolic congestive heart failure.       Ejection fraction of 45%.   6. Moderate mitral regurgitation.   7. Moderate tricuspid regurgitation.   8. Anemia, status post 1 unit of packed red blood cells.   9. Klebsiella urinary tract infection, sensitive to Cipro.   10.Stage III chronic kidney disease with a creatinine of 1.57 today.  11.Osteoarthritis Elev glu H/o CVA Vit D def  Past Surgical History: Reviewed history from 01/15/2010 and no changes required.  left anterior descending  stenting and percutaneous transluminal coronary angioplasty of the    circumflex Hysterectomy  Back surgery.  Coronary artery disease status post PROMUS drug-eluting stent     placement to the mid and distal left anterior descending (coronary     artery), on either side of the previously placed/widely patent     Cypher stent November 21, 2009.  Family History: Reviewed history from 08/28/2009 and no changes required. Family History Hypertension   Her mother  died of a brain aneurysm, father with an MI   in the 57s.      Social History: Reviewed history from 08/28/2009 and no changes required. Retired Married 60 years 2011 Never Smoked Non-drinker  Review of Systems       REVIEW OF SYSTEMS  GEN: No systemic complaints, no fevers, chills, sweats, or other acute illnesses MSK: Detailed in the HPI GI: tolerating PO intake without difficulty Neuro: No numbness, parasthesias, or tingling associated. Otherwise the pertinent positives of the ROS are noted above.    Physical Exam  General:  GEN:  Well-developed,well-nourished,in no acute distress; alert,appropriate and cooperative throughout examination HEENT: Normocephalic and atraumatic without obvious abnormalities. No apparent alopecia or balding. Ears, externally no deformities PULM: Breathing comfortably in no respiratory distress EXT: No clubbing, cyanosis, or edema PSYCH: Normally interactive. Cooperative during the interview. Pleasant. Friendly and conversant. Not anxious or depressed appearing. Normal, full affect.  Msk:  R ankle Echymosis: dorsally, more distally Edema: anterolateral ROM: restricted moderately in all directions Gait: heel toe, antalgic Lateral Mall: NT Medial Mall: NT Talus: NOTABLY TTP ANTERIOR TALUS Navicular: NT Cuboid: NT Calcaneous: NT Metatarsals: NT 5th MT: NT Phalanges: NT Achilles: NT Plantar Fascia: NT Fat Pad: NT Peroneals: NT Post Tib: NT Great Toe: APPROX 35% LOSS OF  MOTION Ant Drawer: neg Talar Tilt: neg ATFL: MARKEDLY TTP CFL: NT Deltoid: NT Str: 3+/5 - INVERSION AND EVERSION Other Special tests: KLEIGER AND SQUEEZE NEG Sensation: intact    Impression & Recommendations:  Problem # 1:  ANKLE PAIN (ICD-719.47) Assessment New ATFL sprain, Right, with anterior talus avulsion fracture  Immobilize for 4 weeks, then reevaluate Weightbearing is allowed.  Tylenol as needed pain.  cc: Dr. Posey Rea It was a pleasure to see this very nice patient.  Problem # 2:  ANKLE SPRAIN, RIGHT (ICD-845.00) Assessment: New  Her updated medication list for this problem includes:    Aspirin 325 Mg Tabs (Aspirin) .Marland Kitchen... 1 tab by mouth once daily  Problem # 3:  UNSPECIFIED CLOSED FRACTURE OF ANKLE (ICD-824.8) Assessment: New  Anterior talus avulsion fracture  Recommendations: Classically these do well with 4 weeks of immobilization in a walking cast vs. CAM walker  I have placed the patient in an Aircast XP CAM walker boot - 4 weeks, potentially longer given age Script given for a  cane  Pneumatic compression has clearly been demonstrated to decrease swelling, aid in proprioception long term, improve recovery time and decrease time in physical therapy.   A UNIVERSAL FRACTURE CHARGE HAS BEEN APPLIED IN THE CARE OF THIS INJURY.  No co-pay should be applied in the future, and there is a 90 day follow-up period for subsequent care of this injury.   Orders: Tarsal Bone Fx (04540)  Complete Medication List: 1)  Metoprolol Tartrate 100 Mg Tabs (Metoprolol tartrate) .Marland Kitchen.. 1 1/2 tab by mouth two times a day 2)  Plavix 75 Mg Tabs (Clopidogrel bisulfate) .Marland Kitchen.. 1 qd 3)  Levothroid 75 Mcg Tabs (Levothyroxine sodium) .... Qd 4)  Methyldopa 250 Mg Tabs (Methyldopa) .Marland Kitchen.. 1 by mouth two times a day` 5)  Meclizine Hcl 25 Mg Tabs (Meclizine hcl) .Marland Kitchen.. 1 three times a day 6)  Pravastatin Sodium 20 Mg Tabs (Pravastatin sodium) .Marland Kitchen.. 1 by mouth bid 7)  Aspirin 325 Mg Tabs (Aspirin) .Marland Kitchen.. 1 tab by mouth once daily 8)  Nitrostat 0.4 Mg Subl (Nitroglycerin) .Marland Kitchen.. 1 tablet under tongue at onset of chest pain; you may repeat every 5 minutes for up to 3  doses. 9)  Co Q 10  .... Daily  Patient Instructions: 1)  recheck in 4 weeks 2)  xrays 30 mins before appt: ankle series 3)  re: Talus fracture   Orders Added: 1)  Est. Patient Level III [60454] 2)  Tarsal Bone Fx [28450]    Current Allergies (reviewed today): ! PCN VYTORIN CRESTOR

## 2010-05-21 NOTE — Assessment & Plan Note (Signed)
Summary: 3 MO FU/#CD   Vital Signs:  Patient profile:   77 year old female Height:      67 inches Weight:      219 pounds BMI:     34.42 Temp:     97.7 degrees F oral Pulse rate:   64 / minute Pulse rhythm:   regular Resp:     16 per minute BP sitting:   140 / 78  (left arm) Cuff size:   regular  Vitals Entered By: Lanier Prude, Beverly Gust) (May 14, 2010 10:17 AM) CC: 3 mo f/u Is Patient Diabetic? No   Primary Care Provider:  Dr. Avel Sensor  CC:  3 mo f/u.  History of Present Illness: The patient presents for a follow up of hypertension, CAD, hyperlipidemia C/o a tripped  fall last Fri: c/o L wrist pain and R ankle pain and swelling. No LOC   Current Medications (verified): 1)  Metoprolol Tartrate 100 Mg Tabs (Metoprolol Tartrate) .Marland Kitchen.. 1 1/2 Tab By Mouth Two Times A Day 2)  Plavix 75 Mg Tabs (Clopidogrel Bisulfate) .Marland Kitchen.. 1 Qd 3)  Levothroid 75 Mcg Tabs (Levothyroxine Sodium) .... Qd 4)  Methyldopa 250 Mg  Tabs (Methyldopa) .Marland Kitchen.. 1 By Mouth Two Times A Day` 5)  Meclizine Hcl 25 Mg  Tabs (Meclizine Hcl) .Marland Kitchen.. 1 Three Times A Day 6)  Pravastatin Sodium 20 Mg  Tabs (Pravastatin Sodium) .Marland Kitchen.. 1 By Mouth Bid 7)  Aspirin 325 Mg  Tabs (Aspirin) .Marland Kitchen.. 1 Tab By Mouth Once Daily 8)  Nitrostat 0.4 Mg Subl (Nitroglycerin) .Marland Kitchen.. 1 Tablet Under Tongue At Onset of Chest Pain; You May Repeat Every 5 Minutes For Up To 3 Doses. 9)  Co Q 10 .... Daily  Allergies (verified): 1)  ! Pcn 2)  Vytorin 3)  Crestor  Past History:  Past Medical History: Last updated: 08/28/2009  1. Coronary artery disease status post prior left anterior descending       stenting and percutaneous transluminal coronary angioplasty of the       circumflex this admission.   2. Hypertension.   3. Hyperlipidemia.   4. Hypothyroidism.   5. Ischemic cardiomyopathy/acute systolic congestive heart failure.       Ejection fraction of 45%.   6. Moderate mitral regurgitation.   7. Moderate tricuspid regurgitation.   8.  Anemia, status post 1 unit of packed red blood cells.   9. Klebsiella urinary tract infection, sensitive to Cipro.   10.Stage III chronic kidney disease with a creatinine of 1.57 today.  11.Osteoarthritis Elev glu H/o CVA Vit D def  Social History: Last updated: 08/28/2009 Retired Married 60 years 2011 Never Smoked Non-drinker  Review of Systems  The patient denies fever and abdominal pain.         BP is ok at home  Physical Exam  General:  Affect appropriate Healthy:  appears stated age HEENT: normal Neck supple with no adenopathy JVP normal no bruits no thyromegaly Lungs clear with no wheezing and good diaphragmatic motion Heart:  S1/S2 no murmur,rub, gallop or click PMI normal Abdomen: benighn, BS positve, no tenderness, no AAA no bruit.  No HSM or HJR Distal pulses intact with no bruits No edema Neuro non-focal Skin warm and dry  Ears:  External ear exam shows no significant lesions or deformities.  Otoscopic examination reveals clear canals, tympanic membranes are intact bilaterally without bulging, retraction, inflammation or discharge. Hearing is grossly normal bilaterally. Nose:  External nasal examination shows no deformity or inflammation. Nasal mucosa are  pink and moist without lesions or exudates. Mouth:  Oral mucosa and oropharynx without lesions or exudates.  Teeth in good repair. Neck:  No deformities, masses, or tenderness noted. Lungs:  Normal respiratory effort, chest expands symmetrically. Lungs are clear to auscultation, no crackles or wheezes. Heart:  Normal rate and regular rhythm. S1 and S2 normal without gallop, murmur, click, rub or other extra sounds. Abdomen:  Bowel sounds positive,abdomen soft and non-tender without masses, organomegaly or hernias noted. Msk:  No deformity or scoliosis noted of thoracic or lumbar spine.  L wrist is a little tender R ankle is swollen and tender Extremities:  No clubbing, cyanosis, edema, or deformity noted  with normal full range of motion of all joints.   Neurologic:  No cranial nerve deficits noted. Station and gait are normal. Plantar reflexes are down-going bilaterally. DTRs are symmetrical throughout. Sensory, motor and coordinative functions appear intact. Skin:  Intact without suspicious lesions or rashes Moles on back R big toenail deformed and thick Psych:  Cognition and judgment appear intact. Alert and cooperative with normal attention span and concentration. No apparent delusions, illusions, hallucinations   Impression & Recommendations:  Problem # 1:  ANKLE PAIN (ICD-719.47) R Assessment New  Orders: Ace Wraps 3-5 in/yard  (Q0347) T-Ankle Comp Right (73610TC)  Problem # 2:  WRIST PAIN (ICD-719.43) L Assessment: New contusion  Problem # 3:  CORONARY ATHEROSCLEROSIS NATIVE CORONARY ARTERY (ICD-414.01) Assessment: Unchanged  Her updated medication list for this problem includes:    Metoprolol Tartrate 100 Mg Tabs (Metoprolol tartrate) .Marland Kitchen... 1 1/2 tab by mouth two times a day    Plavix 75 Mg Tabs (Clopidogrel bisulfate) .Marland Kitchen... 1 qd    Methyldopa 250 Mg Tabs (Methyldopa) .Marland Kitchen... 1 by mouth two times a day`    Aspirin 325 Mg Tabs (Aspirin) .Marland Kitchen... 1 tab by mouth once daily    Nitrostat 0.4 Mg Subl (Nitroglycerin) .Marland Kitchen... 1 tablet under tongue at onset of chest pain; you may repeat every 5 minutes for up to 3 doses.  Problem # 4:  FIBROCYSTIC BREAST DISEASE (ICD-610.1) Assessment: Unchanged s/p mammo  Problem # 5:  HYPOTHYROIDISM, UNSPECIFIED (ICD-244.9) Assessment: Unchanged  Her updated medication list for this problem includes:    Levothroid 75 Mcg Tabs (Levothyroxine sodium) ..... Qd  Problem # 6:  HYPERLIPIDEMIA (ICD-272.4) Assessment: Unchanged  Her updated medication list for this problem includes:    Pravastatin Sodium 20 Mg Tabs (Pravastatin sodium) .Marland Kitchen... 1 by mouth bid  Labs Reviewed: SGOT: 26 (05/10/2010)   SGPT: 15 (05/10/2010)   HDL:53.70 (05/10/2010), 51.60  (01/11/2010)  LDL:101 (05/10/2010), 101 (01/11/2010)  Chol:182 (05/10/2010), 187 (01/11/2010)  Trig:138.0 (05/10/2010), 173.0 (01/11/2010)  Complete Medication List: 1)  Metoprolol Tartrate 100 Mg Tabs (Metoprolol tartrate) .Marland Kitchen.. 1 1/2 tab by mouth two times a day 2)  Plavix 75 Mg Tabs (Clopidogrel bisulfate) .Marland Kitchen.. 1 qd 3)  Levothroid 75 Mcg Tabs (Levothyroxine sodium) .... Qd 4)  Methyldopa 250 Mg Tabs (Methyldopa) .Marland Kitchen.. 1 by mouth two times a day` 5)  Meclizine Hcl 25 Mg Tabs (Meclizine hcl) .Marland Kitchen.. 1 three times a day 6)  Pravastatin Sodium 20 Mg Tabs (Pravastatin sodium) .Marland Kitchen.. 1 by mouth bid 7)  Aspirin 325 Mg Tabs (Aspirin) .Marland Kitchen.. 1 tab by mouth once daily 8)  Nitrostat 0.4 Mg Subl (Nitroglycerin) .Marland Kitchen.. 1 tablet under tongue at onset of chest pain; you may repeat every 5 minutes for up to 3 doses. 9)  Co Q 10  .... Daily  Patient Instructions: 1)  Please  schedule a follow-up appointment in 3 months. 2)  BMP prior to visit, ICD-9:401.1 Prescriptions: NITROSTAT 0.4 MG SUBL (NITROGLYCERIN) 1 tablet under tongue at onset of chest pain; you may repeat every 5 minutes for up to 3 doses.  #1 x 0   Entered and Authorized by:   Tresa Garter MD   Signed by:   Tresa Garter MD on 05/14/2010   Method used:   Faxed to ...       [facility] (mail-order)             , [place]         Ph: [phone]       Fax: [phone]   RxID:   [id number] MECLIZINE HCL 25 MG  TABS (MECLIZINE HCL) 1 three times a day  #270 x 3   Entered and Authorized by:   Tresa Garter MD   Signed by:   Tresa Garter MD on 05/14/2010   Method used:   Faxed to ...       [facility] (mail-order)             , [place]         Ph: [phone]       Fax: [phone]   RxID:   [id number] METHYLDOPA 250 MG  TABS (METHYLDOPA) 1 by mouth two times a day`  #180 x 3   Entered and Authorized by:   Tresa Garter MD   Signed by:   Tresa Garter MD on 05/14/2010   Method used:   Faxed to ...       [facility]  (mail-order)             , [place]         Ph: [phone]       Fax: [phone]   RxID:   [id number] LEVOTHROID 75 MCG TABS (LEVOTHYROXINE SODIUM) qd  #90 Tablet x 3   Entered and Authorized by:   Tresa Garter MD   Signed by:   Tresa Garter MD on 05/14/2010   Method used:   Faxed to ...       [facility] (mail-order)             , [place]         Ph: [phone]       Fax: [phone]   RxID:   [id number] PLAVIX 75 MG TABS (CLOPIDOGREL BISULFATE) 1 qd  #90 x 3   Entered and Authorized by:   Tresa Garter MD   Signed by:   Tresa Garter MD on 05/14/2010   Method used:   Faxed to ...       [facility] (mail-order)             , [place]         Ph: [phone]       Fax: [phone]   RxID:   [id number] METOPROLOL TARTRATE 100 MG TABS (METOPROLOL TARTRATE) 1 1/2 tab by mouth two times a day  #270 x 3   Entered and Authorized by:   Tresa Garter MD   Signed by:   Tresa Garter MD on 05/14/2010   Method used:   Faxed to ...       [facility] (mail-order)             , [place]         Ph: [phone]       Fax: [phone]   RxID:  [id number]    Orders Added: 1)  Ace Wraps 3-5 in/yard  [A6449] 2)  T-Ankle Comp Right [73610TC] 3)  Est. Patient Level IV [74259]

## 2010-05-21 NOTE — Progress Notes (Signed)
Summary: APT TODAY  Phone Note From Other Clinic   Summary of Call: MD req apt w/ Dr Dallas Schimke for ankle fracture. Dr Patsy Lager will wk pt in today at 2pm. Pt informed  Initial call taken by: Lamar Sprinkles, CMA,  May 16, 2010 10:55 AM  Follow-up for Phone Call        Thank you!  Follow-up by: Tresa Garter MD,  May 16, 2010 1:09 PM

## 2010-06-18 ENCOUNTER — Encounter: Payer: Self-pay | Admitting: Internal Medicine

## 2010-06-19 ENCOUNTER — Ambulatory Visit: Payer: Medicare Other | Admitting: Family Medicine

## 2010-06-19 ENCOUNTER — Other Ambulatory Visit: Payer: Medicare Other

## 2010-06-27 ENCOUNTER — Ambulatory Visit (INDEPENDENT_AMBULATORY_CARE_PROVIDER_SITE_OTHER)
Admission: RE | Admit: 2010-06-27 | Discharge: 2010-06-27 | Disposition: A | Discharge status: Home / Self Care | Payer: Medicare Other | Source: Ambulatory Visit | Attending: Family Medicine | Admitting: Family Medicine

## 2010-06-27 ENCOUNTER — Encounter: Payer: Self-pay | Admitting: Family Medicine

## 2010-06-27 ENCOUNTER — Ambulatory Visit (INDEPENDENT_AMBULATORY_CARE_PROVIDER_SITE_OTHER): Payer: Medicare Other | Admitting: Family Medicine

## 2010-06-27 VITALS — BP 120/70 | HR 53 | Temp 97.8°F | Ht 67.5 in | Wt 149.1 lb

## 2010-06-27 DIAGNOSIS — S82899A Other fracture of unspecified lower leg, initial encounter for closed fracture: Secondary | ICD-10-CM

## 2010-06-27 NOTE — Patient Instructions (Signed)
F/u 1 month 

## 2010-06-28 NOTE — Progress Notes (Signed)
  Subjective:    Patient ID: Jean [name], female    DOB: [birth year], 77 y.o.   MRN: [id number]  HPI  RIGHT anterior talar bone fracture. The patient has been immobilized in a Cam Walker boot for one month. She has done well, had been very compliant. At this point she is having minimal pain. No swelling or bruising.  Review of Systems  REVIEW OF SYSTEMS  GEN: No fevers, chills. Nontoxic. Primarily MSK c/o today. MSK: Detailed in the HPI GI: tolerating PO intake without difficulty Neuro: No numbness, parasthesias, or tingling associated. Otherwise the pertinent positives of the ROS are noted above.      Objective:   Physical Exam   GEN: WDWN, NAD, Non-toxic, A & O x 3 HEENT: Atraumatic, Normocephalic. Neck supple. No masses, No LAD. Ears and Nose: No external deformity. PSYCH: Normally interactive. Conversant. Not depressed or anxious appearing.  Calm demeanor.   RIGHT anterior tail is with minimal to no pain. No swelling throughout ankle. There is some stiffness and limitation in motion with approximate loss of 25% overall flexion and extension as well as eversion and inversion. No focal bony tenderness. Strength is 4/5 throughout.   Assessment & Plan:  Healing talar avulsion fracture, RIGHT. Wean out of her Cam Walker boot over the next 4-5 days. Initiate [facility] ankle protocol.  Recheck in 1 mo.  X-ray, Ankle: AP, Lateral, and Mortise Views Indication: f/u fx Findings: Anterior talar avulsion fx shows signs of interval fracture healing compared with prior films.   A UNIVERSAL FRACTURE CHARGE HAS BEEN APPLIED IN THE CARE OF THIS INJURY.   No charge.

## 2010-07-16 NOTE — Assessment & Plan Note (Signed)
[facility]                            CARDIOLOGY OFFICE NOTE   NAME:Jean [name]                       MRN:          [id number]  DATE:04/01/2007                            DOB:          01/31/1929    Jean [name] returns today for followup.  She has a history coronary artery  disease with previous stenting of the LAD.  She has been doing well.  She is not having significant angina.  She has hypertension that has  been somewhat borderline-controlled.  She brought her blood pressure  monitor in today and we checked it.  It correlates well with ours.  Her  blood pressures at home seem to be fine.  Renal duplex done at the end  of December showed no renal artery stenosis.   She has been active.  She is not having chest pain.   REVIEW OF SYSTEMS:  Her review of systems is otherwise unremarkable.   MEDICATIONS:  1. Prilosec 20 a day.  2. Avapro 300 a day.  3. Plavix 75 a day.  4. An aspirin a day.  5. Meclizine p.r.n.  6. Vitamin D.  7. Crestor or 20 mg three times a week.  8. Lopressor 50 b.i.d.  9. Methyldopa 250 b.i.d.   PHYSICAL EXAMINATION:  GENERAL:  A healthy-appearing, somewhat pale  white female in no distress.  VITAL SIGNS:  Weight is 169.  Blood pressure is 150/70, pulse 54 and  regular, afebrile, respiratory rate 14.  HEENT:  Unremarkable.  NECK:  Carotids normal without bruit.  No lymphadenopathy, thyromegaly,  or JVP elevation.  LUNGS:  Clear with diaphragmatic motion.  No wheezing.  CARDIOVASCULAR:  S1 and S2 with a systolic ejection murmur.  PMI normal.  ABDOMEN:  Benign.  No renal bruits.  No tenderness, no AAA, no  hepatosplenomegaly or hepatojugular reflux.  EXTREMITIES:  Distal pulses intact, no edema.  NEUROLOGIC:  Nonfocal.  SKIN:  Warm and dry.   IMPRESSION:  1. Coronary disease, previous stent to the left anterior descending,      stable.  Followup stress Myoview in October of 2009.  Last Myoview      October of 2008  with no evidence of ischemia or infarction.      Ejection fraction 75%.  2. Hypertension, currently well-controlled, component of white coat      hypertension.  Continue current dose of Avapro.  Do not increase      Lopressor any further due to relative bradycardia.  3. History of reflux.  Continue Prilosec 20 mg a day.  4. Hypercholesterolemia.  Previous pain in the leg seems to be      improved off Vytorin.  Continue current dose of Crestor three times      a week.     [name]. Eden Emms, MD, [facility]  Electronically Signed    PCN/MedQ  DD: 04/01/2007  DT: 04/01/2007  Job #: [id number]

## 2010-07-16 NOTE — Assessment & Plan Note (Signed)
[facility]                            CARDIOLOGY OFFICE NOTE   NAME:Jean [name]                       MRN:          [id number]  DATE:08/03/2006                            DOB:          [birth year]    Jean [name] returns today for followup.   She has a history of hypertension, previous left occipital CVA, colon  polyps, mild chronic renal insufficiency, and previous stents to the  LAD.   The patient does not have any significant chest pain. Her last Myoview  study done 12/02/05, was non-ischemic with mild attenuation of the apex  and an EF of 73%. As I recall, her stent to the LAD was done in January  of 2006.   Risk factors are fairly well modified. She is taking her medications  faithfully, although she does need refills.   REVIEW OF SYSTEMS:  Is remarkable for lower extremity leg pains.   She is concerned that it may be due to the Vytorin. We had a bit of the  discussion about the recent [place] [employer] regarding  Vytorin and I told her I thought it would be fine for her to continue  taking the Zetia.   CURRENT MEDICATIONS:  1. Prilosec 20 a day.  2. Avapro 300 a day.  3. Plavix 75 a day.  4. Synthroid 75 mcg a day.  5. Aspirin a day.  6. Vytorin 10/40.  7. Meclizine 25 a day.  8. Methyldopa 240 a day.  9. Lopressor 50 b.i.d.   PHYSICAL EXAMINATION:  Is remarkable for a healthy-appearing, elderly,  white female in no distress. Mood is appropriate. Weight is 165, blood  pressure 150/70, pulse 53 and regular.  HEENT: Is normal. Carotids are normal without bruit.  There is no thyromegaly. No lymphadenopathy. No jugular venous  distention.  LUNGS:  Are clear. No wheezing and normal diaphragmatic motion.  Normal heart sounds. There is an S1, S2. PMI is normal.  ABDOMEN: Is benign. There is no hepatosplenomegaly. No hepatojugular  reflux. No AAA. Bowel sounds are positive. There is no tenderness.  Femorals are +2  bilaterally without bruit. PT's are palpable  bilaterally. There is no lower extremity edema.  NEURO: Is nonfocal. There is no muscular weakness to examination.   IMPRESSION:  1. Coronary artery disease, stable, status post stenting of the left      anterior descending artery (LAD). Continue aspirin and Plavix.      Followup Myoview in October. The patient will probably need      adenosine as she does not walk very well.  2. Risk factor modification. The patient is concerned that the Vytorin      is causing leg pain. We will switch her to Zetia 10 mg a day.  3. Hypertension. Continue Avapro along with her beta-blocker. Her      blood pressure continued to be borderline. We will see what her      home readings are and she will continue her methyldopa which      certainly can be increased to a b.i.d. dose if necessary.  4.  The patient has some reflux symptoms and takes Prilosec. She has      also had previous polyps and history of cholecystitis. She will      followup with her primary care MD for this.   I think her cardiac status is stable and I will see her in December so  long as her Myoview is nonischemic.     [name]. Eden Emms, MD, [facility]  Electronically Signed    PCN/MedQ  DD: 08/03/2006  DT: 08/03/2006  Job #: [id number]

## 2010-07-16 NOTE — Assessment & Plan Note (Signed)
[facility]                            CARDIOLOGY OFFICE NOTE   NAME:Jean [name], Jean [name]                       MRN:          [id number]  DATE:02/03/2007                            DOB:          01/05/1929    Ms. Jean [name] returns today for followup.  She has had a previous left  occipital CVA, chronic mild renal insufficiency, and stent to the left  anterior descending.   She had a Myoview on December 02, 2006 which was nonischemic with an  ejection fraction of 75%.  She is not having significant chest pain.  Her blood pressure has been labile.  She does not use salt at home;  however, she also does not record her blood pressures at home very much.  There is a component of white-coat hypertension.  She has not had any  significant chest pain, palpitations, PND or orthopnea.  There has been  no syncope.   REVIEW OF SYSTEMS:  Otherwise unremarkable.   CURRENT MEDICATIONS:  1. Prilosec 20 a day.  2. Avapro 300 a day.  3. Plavix 75 a day.  4. Synthroid 75 mcg a day.  5. An aspirin a day.  6. Vytorin 10/40.  7. Methyldopa 250 b.i.d.  8. Lopressor 50 b.i.d.   PHYSICAL EXAMINATION:  Her exam is remarkable for a healthy-appearing,  elderly white female in no distress.  Weight is 166, blood pressure is 180/80, pulse 58 and regular.  Respiratory rate 14, afebrile.  Affect appropriate.  HEENT:  Unremarkable.  Carotids normal without bruit, no lymphadenopathy, thyromegaly or JVP  elevation.  LUNGS:  Clear.  Good diaphragmatic motion, no wheezing.  S1, S2 with an S4 gallop, PMI normal.  No murmurs.  ABDOMEN:  Benign, no renal bruits, no AAA, no hepatosplenomegaly or  hepatojugular reflux.  Distal pulses are intact, no edema.  NEURO:  Nonfocal, no muscular weakness.   EKG shows sinus rhythm with a left axis deviation, nonspecific ST-T wave  changes.   IMPRESSION:  1. Coronary disease, previous stent to the left anterior descending,      continue aspirin,  Plavix, and beta blocker.  No angina and low-risk      Myoview.  2. Hypertension, currently suboptimally controlled.  Check recordings      at home for the next 6 weeks.  Check renal duplex to rule out renal      artery stenosis.  Follow up in 6 to 8 weeks to review possible need      to add further medications, possibly clonidine or hydralazine.  3. History of reflux, continue Prilosec 20 mg a day, avoid late night      meals and spicy food.  4. Hypercholesterolemia.  The patient had some pain in her legs with      Vytorin.  This has been stopped.  Her primary care physician has      started her on Crestor, I believe she is taking 20 mg 3 times a      week, Monday, Wednesday, and Friday.  She will follow up with her      primary care  physician and check liver tests in 3 months.  She does      indicate that the lower extremity pain that she had on Vytorin      seems to be a bit better with this regimen of Crestor.  Overall, I      think [name] is doing well, and I will see her back in 6 to 8 weeks      to reassess her blood pressure after her renal duplex.     Jean [name]. Jean Emms, MD, [facility]  Electronically Signed    PCN/MedQ  DD: 02/03/2007  DT: 02/03/2007  Job #: [id number]

## 2010-07-19 NOTE — Discharge Summary (Signed)
   Jean [name], Jean [name]                          ACCOUNT NO.:  [id number]   MEDICAL RECORD NO.:  [id number]                   PATIENT TYPE:  INP   LOCATION:  3005                                 FACILITY:  [facility]   PHYSICIAN:  Stefani Dama, M.D.               DATE OF BIRTH:  08-May-1928   DATE OF ADMISSION:  12/09/2001  DATE OF DISCHARGE:  12/14/2001                                 DISCHARGE SUMMARY   ADMISSION DIAGNOSIS:  Lumbar spondylolisthesis, L4-L5, spondylosis L2-3 and  L3-4, stenosis  L3-4.   DISCHARGE DIAGNOSES:  Lumbar spondylolisthesis, L4-L5, spondylosis L2-3 and  L3-4, stenosis  L3-4.   OPERATION:  Lumbar decompression, L3-4 and L4-5. Posterior interbody  arthrodesis with pedicle screw fixation L3, L4, L5 using minimally invasive  technique and bone spacers L3-4 and L4-5 with posterior interbody  arthrodesis.   CONDITION ON DISCHARGE:  Improving.   HOSPITAL COURSE:  The patient is a 77 year old individual who has had  significant back and leg pain. She had been worked up and was found to have  spondylitic changes throughout the lumbar spine with a degenerative  spondylolisthesis at L4-5 and high grade stenosis at that level. She was  advised regarding surgical decompression and stabilization.   She was taken to the operating room where she underwent initially a  decompression of L4-5, however, the entirety of the facet joint involving  the inferior margin of the facet at L3 had to be sacrificed, and she  underwent two level fixation and decompression using a minimally invasive  technique. She did require 2 units of blood transfusion as her initial  hemoglobin and hematocrit was 34.7 before surgery started, and she did have  700 cc blood loss. She tolerated this well, and postoperatively her  hemoglobin was at 30.   Her incisions have remained clean and dry. She has been gradually ambulated.  She is using oral pain medication in the form of Percocet for her pain  control and Valium as a muscle relaxant. She will be seen in the office in  three time for further followup.   At this point the patient is ambulating with the use of a walker. Her  neurologic status is stable and improved with markedly decreased leg pain  and her condition on discharge is good.                                                Stefani Dama, M.D.    Merla Riches  D:  12/14/2001  T:  12/15/2001  Job:  [id number]

## 2010-07-19 NOTE — Cardiovascular Report (Signed)
Jean [name], Jean [name]                ACCOUNT NO.:  [id number]   MEDICAL RECORD NO.:  [id number]          PATIENT TYPE:  INP   LOCATION:  6599                         FACILITY:  [facility]   PHYSICIAN:  Arturo Morton. Riley Kill, M.D. [facility] OF BIRTH:  1928/05/11   DATE OF PROCEDURE:  03/08/2004  DATE OF DISCHARGE:                              CARDIAC CATHETERIZATION   INDICATIONS FOR PROCEDURE:  Jean [name] is a 77 year old who presents with  progressive angina pectoris leading to a class III presentation.  It has  gotten progressively worse over the past two months.  She underwent  diagnostic catheterization which demonstrated subtotal occlusion of the LAD  with some retrograde collaterals from a mildly diseased right coronary  artery.  There was significant diagonal stenosis of a small branch, and  focal LAD stenosis in a vessel that was calcified and with modest diffuse  disease.  We talked about various options.  Percutaneous intervention was  recommended but the patient is modestly anemic, but rectal exam was  Hemoccult negative even on intravenous heparin.  Importantly, the patient  also had some renal insufficiency, but with appropriate hydration and  discontinuation of hydrochlorothiazide this seemingly improved.  Following  risks, benefits and alternatives, the patient was brought to the  catheterization laboratory for further intervention.   PROCEDURE:  The patient was brought to the catheterization laboratory after  informed consent, prepped and draped in the usual fashion through an  anterior puncture.  The left femoral artery was entered.  There was a fair  amount of calcification noted with entry into the artery with a crunch  feeling with entry.  A guidewire advanced easily, however, into the central  aorta and a #7 French sheath was placed.  A JL 3.5 guiding catheter was  utilized.  Heparin had been previously discontinued and a bivalirudin drip  and bolus were administered with  appropriate ACT checked.  Following this,  pre-dilatation was done using a 2 x 15 mm Maverick balloon.  Following this,  we carefully placed a 23 x 2.5 mm Cypher stent in the mid vessel and this  was taken to 13 atm.  A 2.75 mm Quantum Maverick balloon was then placed and  post-dilatation done throughout the course of the stent.  There was marked  improvement in the angiographic appearance of the vessel.  All catheters  were subsequently removed and final angiographic views obtained.  She was  taken to the holding area in satisfactory clinical condition.   ANGIOGRAPHIC DATA:  The left coronary artery is fairly heavily calcified.  The left main itself is without critical narrowing but is calcified  externally.  There is also external calcification throughout the proximal  LAD with a lot of diffuse 20% to 30% area of focal narrowing.  At the  bifurcation of the LAD and diagonal, the LAD itself has about 30% narrowing  and the diagonal has 70% to 80% narrowing.  Just distal to this is a  subtotal occlusion.  The vessel distal to the large mid portion is small in  caliber.  At the high grade stenosis site, a  23 x 2.5 mm Cypher was deployed  to 13 atm and post-dilated with a 2.75 mm balloon with reduction in the  stenosis from 95% to 0%.  There was TIMI 3 flow both before and after the  procedure.  The lesion was an ACCB lesion because of lesion length.  The  procedure was classified as successful.   CONCLUSION:  Successful percutaneous stenting of the left anterior  descending artery.   RECOMMENDATIONS:  1.  Aspirin and Plavix.  Plavix needs to be administered for a minimum of      three months and preferentially for one      year, given the clinical presentation.  2. Anemia workup needs to be      completed by Dr. Posey Rea and he has been informed of the patient's      anemia.  3. Hydrochlorothiazide should be discontinued and the renal      function followed closely.  This will be done  by Dr. Posey Rea.       TDS/MEDQ  D:  03/08/2004  T:  03/08/2004  Job:  [id number]

## 2010-07-19 NOTE — Op Note (Signed)
Jean [name], LOCKNER                          ACCOUNT NO.:  [id number]   MEDICAL RECORD NO.:  [id number]                   PATIENT TYPE:  INP   LOCATION:  3312                                 FACILITY:  [facility]   PHYSICIAN:  Stefani Dama, M.D.               DATE OF BIRTH:  12-Dec-1928   DATE OF PROCEDURE:  12/09/2001  DATE OF DISCHARGE:                                 OPERATIVE REPORT   PREOPERATIVE DIAGNOSES:  1. Lumbar spondylolisthesis and spondylosis L4-5, L3-4, L2-3.  2. Lumbar radiculopathy on right L5.   POSTOPERATIVE DIAGNOSES:  1. Lumbar spondylolisthesis and spondylosis L4-5, L3-4, L2-3.  2. Lumbar radiculopathy on right L5.   OPERATION:  1. Gill procedure L4-L5.  2. Laminectomy L3.  3. Posterior interbody arthrodesis L3-4, L4-5 with T Liss bone space and     local autograft.  4. Pedicle fixation L3, L4, and L5 with minimally invasive Sexton technique.  5. Microdissection with microscope via Medtrax  approach L3-4 and L4-5     right.   SURGEON:  Stefani Dama, M.D.   FIRST ASSISTANT:  Clydene Fake, M.D.   ANESTHESIA:  General endotracheal.   INDICATIONS FOR PROCEDURE:  The patient is a 77 year old individual who has  had significant back and right lower extremity pain.  She had severe  spondylosis and a spondylolisthesis at the L4-5 level causing right L5 nerve  root entrapment.  She also had spondylitic stenosis at the L3-L4 level which  was mild.  It was initially discussed with the patient that she should  undergo a decompression at the L4-L5 level.  This was procedure was planned;  however, at the time of the surgery, there was noted to be severe  degeneration of the facets such that the facet at the L3-L4 level was  involved with overgrowth to the extent that it was fractured off the  inferior margin at the time of the decompression at L4-L5.  It was then felt  that stabilization would need to be completed through L3.  This was  performed in  addition to the decompression and fusion at the L4-L5 level.  The patient underwent decompression at L4-L5 and subsequent pedicle fixation  from L3 to L5 with interbody graft placement.   DESCRIPTION OF PROCEDURE:  The patient was brought to the operating room  supine on a stretcher.  After the smooth induction of general endotracheal  anesthesia, she was turned prone.  The back was shaved, prepped with  DuraPrep and draped in a sterile fashion.  Then using fluoroscopic guidance  pedicle screws were placed initially on the left side at L4 and L5.  This  was first done by using a K wire to localize the pedicle.  Then using a PAK  needle, the pedicles were sounded.  A K wire was placed deep into the  vertebral body and then 45 mm x 6.5 mm pedicle screws  were placed over this.  EMG recordings were obtained with stimulation through the K wire and then  taps at each of the levels at L4 and L5 on the left side.  Once this was  secured, the right side interspace at L4-5 was then instrumented.  A 22 mm x  5 cm deep Medtrax cannula was placed in this region.  There was noted to be  severe overgrowth of the facet joint at the L4-L5 level.  This was taken  down with a Midas-Rex and a 2.8 mm dissecting tool.  During this procedure,  the facet was completely removed and bone was harvested for use as  Autograft.  The pedicle of L4 superiorly was ultimately identified and the  L4 nerve root could be traced in its path at the extra foramen. The facet  joint; however,  from the inferior margin of L3 doing this procedure was  involved heavily with the spondylosis and during this procedure was  partially resected; however, after probing the pedicle of L4 for placement  of a screw, the more superior portion had fractured off thus leaving no  facet on the right side at L3-L4. It was felt at this point, that the  patient would require stabilization at the L3-L4 level and this was  continued by performing  diskectomy at L3-L4 and placing a T Liss bone  spacer.  The dissection of the common dual tube and the L5 nerve root was  done with the help of Dr. Colon Branch using a microdissection technique to  first isolate the epidural veins, cauterize and divide them to maintain  hemostasis.  The disc space at L4-5 was then similarly isolated and  diskectomy at L4-5 was completed on the right hand side with care being  taken to protect the L4 nerve root superiorly.  The L5 nerve root was  identified and protected medially.  The interspace was then cleared out  completely at L4-5 removing a significant quantity of markedly degenerated  disc material from within the disc space.  With this maneuver distraction of  the L4-5 disc space was completed using the opposite pedicle screw construct  with the 50 mm rod that was placed at that time.  The interspace was cleared  completely.  A combination of curets and rongeurs were used both medially  and laterally to remove all the disc space and remove all the cortical  surfaces of bone at the L4-L5 level.  Then a 9 mm T Liss bone spacer was  sized and placed into the interspace and tamped to the ventricle aspect of  the vertebral body.  The patient's own Autograft mixed with a total of 15 cc  of V-Toss bone expander was placed into the interspace and tamped ventrally.  Care was taken to protect the L4 nerve root superiorly, the L5 nerve root  inferiorly and the common dural tube.  L3-4 then underwent an interbody  grafting in a similar fashion using care to decorticate the bony surfaces  adequately at L3-4 and L4-5.  Once this was completely cleared a 9 mm T Liss  bone sizer was placed into this interspace and T Liss bone spacer was then  placed after the sizer was removed.  Again the patient's own Autograft mixed  with V-Toss was packed into the interspace to fill it with care being taken to protect the L3 nerve root superiorly and the L4 nerve root  inferiorly.  In the end then, we had the pedicles exposed from  L3, L4 and L5 on the right  side via small incision with the Medtrax cannulae.  Pedicle probes were then  placed in L4 and L5 and a K wire was placed into L3.  Then using direct  visualization a 6.5 x 45 mm screw was placed into L5, L4.  The Medtrax tube  was removed so as to allow placement of the L3 pedicle screw with EMG  recordings being obtained to make sure that there was no cut out.  With this  then, a 70 mm rod could be placed between the pedicle screws on the right  side with direct visualization.  The center rod was secured to the screw and  then compression was placed on L4-5 and at L3-4 to allow for further  lordosis in the posterior construct.  Radiographic confirmation of the  pedicle screws and the construct was obtained as the screws were being  tightened down.  Attention was then turned back to the left side where  screws had been placed at L4 and L5.  The rod that had been provisionally  placed between them was a 50 mm rod.  This was extracted with the [facility]  device.  Then the screws were removed over the K wire so that the  preposition interbody fixation device could be used.  The pedicle at L3 was  sounded on the left side and a K wire was placed after PAK needle was placed  into the pedicle and to the vertebral body.  The K wire was checked for no  contact with the nerve root that was running in this area and the screw hole  was tapped and this was again checked with EMG recordings identifying that  there was no continuity with stimulation to the cap itself.  A 6.5 x 45 mm  pedicle screw was then placed into L3 and then an 80 mm rod was slid between  L3 and L4 and L5 on the left side using the Superior device.  Again the screws  were tightened in slight compression.  Some lordosis was instilled with this  maneuver.  The screws were locked down and the caps were broken off and then  final x-rays were obtained  in the AP and lateral planes.  The procedure took  approximately eight hours total with the patient having a blood loss of  approximately 12 to 1400 cc.  One unit of cell saver blood was returned.  The patient did not have the cell saver used initially at the start of the  case, as we were only planning on doing a one level at L4-5 and subsequently  the level had to be extended to include L3-L4.                                                Stefani Dama, M.D.    Merla Riches  D:  12/09/2001  T:  12/10/2001  Job:  [id number]

## 2010-07-19 NOTE — Discharge Summary (Signed)
Jean [name], Jean [name]                ACCOUNT NO.:  [id number]   MEDICAL RECORD NO.:  [id number]          PATIENT TYPE:  INP   LOCATION:  6529                         FACILITY:  [facility]   PHYSICIAN:  Thomos Lemons, D.O. [facility]   DATE OF BIRTH:  Oct 21, 1928   DATE OF ADMISSION:  03/05/2004  DATE OF DISCHARGE:  03/09/2004                                 DISCHARGE SUMMARY   DISCHARGE DIAGNOSES:  1.  Coronary artery disease, status post Cypher stent to the left anterior      descending.  2.  Hyperlipidemia.  3.  Anemia.  4.  Borderline renal insufficiency.  5.  Hypertension.   BRIEF ADMISSION HISTORY:  Ms. Stanke is a 77 year old white female without  prior cardiac history who presented with new onset exertional chest pain.  Symptoms relieved with rest.  She has had progressive fatigue.  She has  described nocturnal chest pain over the past two nights.   PAST MEDICAL HISTORY:  1.  Hypertension.  2.  Hypercholesterolemia.  3.  History of vertigo.  4.  Hypothyroidism.  5.  Status post bladder tack x2.  6.  Hysterectomy.  7.  Lumbar laminectomy.   HOSPITAL COURSE:  1.  Cardiovascular.  The patient was admitted with exertional chest pain      worrisome for coronary disease.  The patient was evaluated by      cardiology.  She was started on heparin and nitroglycerin drips.  She      was scheduled for cardiac catheterization.  This was performed on      March 06, 2004.  She was found to have stenosis to the LAD.  Stent      placement was put on hold secondary to some mild renal insufficiency.      We suspect that she was dehydrated from her HCTZ.  The HCTZ was held and      she was gently rehydrated.  On  March 08, 2004, she underwent PCI to      the LAD.  She will need aspirin and Plavix for one month.  She was      otherwise stable for discharge with outpatient followup.  2.  Acute renal insufficiency.  This was probably multifactorial secondary      to HCTZ as well as some post contrast  nephropathy after her cath.  The      patient's creatinine rose to 1.5.  Her HCTZ was discontinued.  Her      followup BMET showed improvement in her creatinine to 1.4, and she was      felt to be stable for discharge home.  3.  Normocytic anemia.  B12 and iron studies were normal.  Stool for occult      blood was negative, and she remained hemodynamically stable. We suspect      part of her anemia was delusional.   LABS AT DISCHARGE:  Hemoglobin was 10.1.  Stool for blood was negative.  Coags were normal.  BUN 17, creatinine 1.4.  LFTs were normal.  Serial  cardiac enzymes were negative.  Cholesterol 166, triglycerides 90,  HDL 54,  LDL 94, TSH 0.563, iron studies were normal.   MEDICATIONS AT DISCHARGE:  1.  Plavix 75 mg daily for three months.  2.  Aspirin 325 mg daily.  3.  Toprol XL 50 mg 1-1/2 tabs daily.  4.  Avapro 300 mg daily.  5.  Nitro-Stat p.r.n.  6.  Vytorin 10/20 daily.  7.  Synthroid 75 mcg daily.  8.  She has been instructed not to take Micardis.   She was instructed to follow up with Dr. Eden Emms Friday, January 20 at 2:15  p.m.      LC/MEDQ  D:  05/24/2004  T:  05/25/2004  Job:  [id number]   cc:   Charlton Haws, M.D.

## 2010-07-19 NOTE — Assessment & Plan Note (Signed)
[facility]                              CARDIOLOGY OFFICE NOTE   NAME:[name], Jean GRAVELLE                       MRN:          [id number]  DATE:11/18/2005                            DOB:          January 24, 1929   Jean [name] returns today for followup.  She is status post angioplasty of the  LAD and she is due to have a Myoview; it has been over a year.  She has not  had any significant chest pain.   She has had some problems with her blood pressure control.   She used to be on Avalide, but felt dizzy; her diuretic was stopped.  Her  BUN was mildly elevated at 24 with a creatinine of 1.4.   Dr. Posey Rea asked her to increase her Lopressor to 100 mg b.i.d. from 50  mg b.i.d. and she feels like she has had a significant cough since then.  There is no history of asthma, but I suspect she may have some bronchial  reactive disease.   I looked over her medication list extensively.  After thinking about this, I  think the best approach would be to stop her Vytorin and add Caduet and this  way here she will get the benefit of a statin and also a new blood pressure  medicine, amlodipine, without extra cost; I think this would be better than  going up to such a high dose of Lopressor, particularly since her resting  heart rate is only in the 50s.   EXAM:  VITAL SIGNS:  The heart rate is 53.  Blood pressure is 158/88.  LUNGS:  Clear.  CARDIAC:  Carotids are normal.  There is an S1 and S2 with normal heart  sounds.  ABDOMEN:  Benign.  LOWER EXTREMITIES:  Pulses 2+, no edema.   EKG:  Read as sinus tachycardia with 2:1 AV block; however, she is in sinus  bradycardia at a rate of 53 with no acute changes.   IMPRESSION:  Stable stenting of the left anterior descending, followup  Myoview.  I told her I would like to wait about 6 weeks to get her on a  stable blood pressure regimen before we do her stress test.   Hopefully, the addition of Caduet to her previous dose of  Lopressor 50 mg  b.i.d. and her Avapro will control her blood pressure.  At some point, she  will need followup lipid and liver profile as well as a BMET, once her  medications are stabilized.                               [name]. Eden Emms, MD, [facility]   PCN/MedQ  DD:  11/18/2005  DT:  11/19/2005  Job #:  [id number]

## 2010-07-19 NOTE — Consult Note (Signed)
Jean [name], Jean [name]                ACCOUNT NO.:  [id number]   MEDICAL RECORD NO.:  [id number]          PATIENT TYPE:  INP   LOCATION:  4741                         FACILITY:  [facility]   PHYSICIAN:  Gene Serpe, P.A. [facility]   DATE OF BIRTH:  05-30-1928   DATE OF CONSULTATION:  03/05/2004  DATE OF DISCHARGE:                                   CONSULTATION   CONSULTING PHYSICIAN:  Charlton Haws, M.D.   REFERRING PHYSICIAN:  Georgina Quint. Plotnikov, M.D. [facility]   REASON FOR CONSULTATION:  Ms. Roedel is a pleasant 77 year old female with no  prior cardiac history, now referred for symptoms suggestive of unstable  angina pectoris.  The patient presents with an approximately two month  history of new onset anterior chest discomfort associated with exertion.  She reports initial episode approximately two months ago while walking in a  parking lot at a football game.  She had associated dyspnea and radiation to  the bilateral shoulders but no other associated symptoms.  Her symptoms were  relieved with rest.  Since then she has experienced exertional chest  discomfort with brisk walking as well as significant fatigue in the recent  past.  Over the last two nights, she has developed anterior chest discomfort  at rest, her worst episode occurring last night while lying in bed at  approximately 8:30.  She developed severe pain (7/10) with bilateral  shoulder radiation down to the biceps.  There was no associated dyspnea,  diaphoresis, or nausea.  Her episode lasted approximately one hour in  duration.  Of note, the patient has also recently started using her  husband's nitroglycerin, reporting some relief for an episode which occurs  Sunday evening.  She currently denies any chest pain.   ALLERGIES:  PENICILLIN.  Denies allergy to shellfish, contrast dye.   MEDICATIONS PRIOR TO ADMISSION:  1.  Micardis 80/12.5 every day.  2.  Meclizine 25 t.i.d.  3.  Levothyroxine 0.075 every day.  4.  Prilosec 20  b.i.d.  5.  Norvasc 2.5 every day.  6.  Vytorin 10/20 mg one half tablet every day.  7.  Aspirin 81 every day.   PAST MEDICAL HISTORY:  1.  Hypertension (approximately five years).  2.  Hyperlipidemia.  3.  Hyperthyroidism.  4.  Longstanding vertigo.  5.  Status post hysterectomy.  6.  Lumbar laminectomy, 2003.  7.  Bladder tack (x 2).  8.  Remote occult TIA (per MRI).   SOCIAL HISTORY:  Married.  Has never smoked tobacco.  Denies alcohol use.   FAMILY HISTORY:  Mother deceased at age 73, cerebral aneurysm.  Father  deceased at age 64 secondary to fatal myocardial infarction (first).  Two  brothers, 94 and 33, both having had myocardial infarctions in the recent  past.   REVIEW OF SYSTEMS:  Denies exertional dyspnea, orthopnea, PND, or edema.  Occasional palpitations.  Reports lower extremity claudication (right  greater than left) and leg cramps.  Denies any recent hematuria, hemoptysis,  hematochezia, or melena.  Denies recent fever, cough, or chills.  Denies  heartburn symptoms.  Remaining systems  negative.   PHYSICAL EXAMINATION:  VITAL SIGNS:  Blood pressure 152/78, pulse 78,  regular respirations, temperature 97.1.  SaO2 of 100 (room air).  GENERAL:  A 77 year old female in no apparent distress.  HEENT:  Normocephalic, atraumatic.  NECK:  __________  carotid pulses without bruits.  LUNGS:  Clear to auscultation all fields.  HEART:  Regular rate and rhythm (S1 S2) positive S4.  No murmurs.  ABDOMEN:  Soft nontender with intact bowel sounds.  No bruits.  EXTREMITIES:  Preserved left femoral pulses.  Left femoral bruit.  Diminished but palpable dorsalis pedis pulses.  No significant edema.  NEURO:  Flat affect.   Electrocardiogram:  Normal sinus rhythm at 80 BPM with left axis deviation  and ST segment flattening with less than 1-mm depression in the  anterolateral leads.   IMPRESSION:  Ms. Goshorn is a 77 year old female with no prior cardiac history  but multiple  cardiac risk factors who presents with symptoms suggestive  crescendo angina pectoris and abnormal electrocardiogram.   PLAN:  1.  Recommendation is to proceed with coronary angiography in the morning.      Risks/benefits have been discussed and the patient is agreeable to      proceed.  2.  Medication regimen will consist of aspirin, Lopressor 25 t.i.d.,      intravenous nitroglycerin, and intravenous heparin.  3.  If the patient has recurrent chest pain, our plan is to repeat an      electrocardiogram and start her on Integrilin.  4.  Cardiac markers will be cycled and a fasting lipid profile for the      morning will be added as well.  Given the addition of Lopressor, we will      stop Norvasc and continue Micardis HCT.  5.  A repeat electrocardiogram will be ordered for the morning.       GS/MEDQ  D:  03/05/2004  T:  03/05/2004  Job:  [id number]

## 2010-07-19 NOTE — Cardiovascular Report (Signed)
NAMERICHELLE, GLICK                ACCOUNT NO.:  [id number]   MEDICAL RECORD NO.:  [id number]          PATIENT TYPE:  INP   LOCATION:  4741                         FACILITY:  [facility]   PHYSICIAN:  Arturo Morton. Riley Kill, M.D. [facility] OF BIRTH:  Jan 03, 1929   DATE OF PROCEDURE:  03/06/2004  DATE OF DISCHARGE:                              CARDIAC CATHETERIZATION   INDICATIONS:  Ms. Pennywell is a 77 year old woman who has had a several month  history of progressive exertional chest tightness.  She has had two episodes  of chest discomfort at night.  Because of this she was brought to the cath  lab for further evaluation.  Importantly, on arrival in the cath lab she had  a creatinine clearance of 34 cc/min.  She was hydrated earlier in the day.  Limited catheterization was suggested and we elected not to do a distal  aortogram.   PROCEDURES:  1.  Left heart catheterization.  2.  Selective coronary arteriography.   DESCRIPTION OF THE PROCEDURE:  The procedure was done from the right femoral  artery.  Central aortic and left ventricular pressures were measured with a  pigtail.  We did not perform ventriculography.  Limited contrast coronary  arteriography was then done with right and left coronary catheters.  X-ray  contrast, 25 cc, was utilized total __________  because of the LVDP was  relatively low were increased to 175 cc/hr.  I reviewed the films with Dr.  Loraine Leriche  Pulsipher and the decision was made to perform elective percutaneous  intervention when the creatinine was stabilized.   HEMODYNAMIC DATA:  1.  Central aortic pressure 134/61, mean 88.  2.  Left ventricle 159/16.  3.  No gradient on pull back across the aortic valve.   ANGIOGRAPHIC DATA:  1.  There was moderate calcification about the left and right coronary      systems.  2.  The left main has mild calcification without significant focal      narrowing.  3.  The left anterior descending artery has tandem areas of about  20-30%      narrowing associated with some calcification in the proximal mid vessel.      After the origin of the diagonal is a high grade focal stenosis of about      90%.  The distal vessel terminates at the apex.  On right coronary      injection there is faint collateralization of what appears to be likely      a distal LAD.  The diagonal is of small to moderate size, has about 70-      80% proximal narrowing.  4.  The circumflex consists of two marginal branches and other than minor      luminal irregularity is free of critical disease.  5.  The right coronary artery is also calcified with 20-30% narrowing      proximally and 20-30% narrowing in the mid vessel as well as 20%      narrowing distally.  The posterior descending and posterolateral      branches were without critical disease.  The distal vessel appears to      provide collaterals to the distal LAD.   CONCLUSIONS:  1.  High grade single-vessel coronary artery disease.  2.  Normal left ventricle end diastolic pressure.   PLAN:  1.  The patient was given oral clopidogrel.  2.  We will do a rectal exam.  3.  I will  hydrate her to try to improve on her renal function and try to      obtain more information from Dr. Posey Rea.  4.  Elective PCI will be recommended most likely, however, we need to check      as the patient is also anemic.  5.  Further evaluation and treatment will be dependent upon a critical      analysis of this.       TDS/MEDQ  D:  03/06/2004  T:  03/06/2004  Job:  [id number]   cc:   Georgina Quint. Plotnikov, M.D. [facility]   Charlton Haws, M.D.   [facility]   Patient's Medical Record

## 2010-07-19 NOTE — H&P (Signed)
[facility] ADMISSION   NAME:[name], [name]                       MRN:          [id number]  DATE:12/30/2005                            DOB:          11-21-1928    Jean [name] returns today for followup.  She has had somewhat refractory  hypertension.  Today in the office, she appears a bit confused.  I suspect  she has some early onset Alzheimer's.  Her husband seemed to be a bit  frustrated with her memory, as well.  We spent quite a bit of time going  over her medications, as she seemed confused by it.  She may have been  taking her Aldomet only once a day.  She is status post angioplasty of her  LAD and had a nonischemic Myoview study earlier this year.  She cannot  tolerate Caduet or Norvasc due to lower extremity edema.   We currently have her on Lopressor, Avapro and Aldomet for her blood  pressure control.  She did not have any significant angina, PND or  orthopnea.  Her lower extremity edema has improved off the calcium blocker.   PHYSICAL EXAMINATION:  VITAL SIGNS:  The blood pressure is 148/80, pulse 70  and regular.  LUNGS:  Clear.  NECK:  Carotids are normal.  HEART:  There is an S1 and S2 with an S4 gallop.  ABDOMEN:  Benign.  LOWER EXTREMITIES:  Intact pulses.  No edema.  NEUROLOGIC:  Nonfocal.  HEENT:  Normal.  SKIN:  Warm and dry.   IMPRESSION:  Stable, status post angioplasty and stenting of the LAD.  Continue aspirin and Plavix.  She will continue her beta blocker.  Dr.  Posey Rea will check her cholesterol when he sees her in December.  She will  continue to monitor her blood pressure at home, and avoid salt.  Overall, I  am pleased with her progress.   Her last Myoview study was on December 02, 2005, and she will most likely need  one again in October of 2008, given the proximal LAD disease that she has.    ______________________________  [name]. Eden Emms, MD, [facility]    PCN/MedQ  DD:  12/30/2005  DT: 12/30/2005  Job #: [id number]

## 2010-07-19 NOTE — H&P (Signed)
NAMESAYANA, Jean [name]                ACCOUNT NO.:  [id number]   MEDICAL RECORD NO.:  [id number]          PATIENT TYPE:  INP   LOCATION:  6529                         FACILITY:  [facility]   PHYSICIAN:  Georgina Quint. Plotnikov, M.D. LHCDATE OF BIRTH:  May 19, 1928   DATE OF ADMISSION:  03/05/2004  DATE OF DISCHARGE:  03/09/2004                                HISTORY & PHYSICAL   CHIEF COMPLAINT:  Chest pain and weakness.   HISTORY OF PRESENT ILLNESS:  The patient is a 77 year old female who was  admitted from the office with the above complaints of recurrent chest pain  occasionally at night. I think I dictated the note on the day of admission,  however I am unable to locate my dictation and neither the office note copy  on the hospital chart. Cardiology consultation was called the same day.  There were no syncopal episodes. The pain was severe, 7/10, with bilateral  shoulder radiation. There was no dyspnea, diaphoresis, or nausea.   ALLERGIES:  PENICILLIN.   CURRENT MEDICATIONS:  1.  Micardis 80/12.5 daily.  2.  Meclizine 25 t.i.d. p.r.n.  3.  Levothyroxine 75 mcg daily.  4.  Prilosec 20 mg b.i.d.  5.  Norvasc 2.5 mg daily.  6.  Vytorin 10/20 half tablet daily.  7.  Aspirin 81 mg daily.   PAST MEDICAL HISTORY:  1.  Hypertension.  2.  Hyperlipidemia.  3.  Hypothyroidism.  4.  Vertigo.  5.  History of low back pain.  6.  Status post back surgery.  7.  Remote TIA.   FAMILY HISTORY:  Mother died with cerebral aneurysm. Father had a heart  attack in his 41s.   SOCIAL HISTORY:  Does not smoke or drink. She is married.   REVIEW OF SYSTEMS:  As above. Otherwise, unremarkable.   PHYSICAL EXAMINATION:  VITAL SIGNS: Not available from my note.  GENERAL: She is in no acute distress.  HEENT: Moist.  NECK: Supple.  LUNGS: Clear. No wheezes or rales.  HEART: S1 and S2. No murmurs, rubs, or gallops.  ABDOMEN: Soft and nontender. No organomegaly. No masses felt.  EXTREMITIES: Lower  extremities without edema. Good peripheral pulses. She is  alert, oriented, and cooperative.  SKIN: Clear with aging changes of LS spine without deformities.   ASSESSMENT/PLAN:  1.  Chest pain likely related to unstable angina. The patient was admitted      to the hospital for IV morphine, nitroglycerin, IV fluids, subcu      Lovenox, and a cardiology consultation. I will obtain labwork to rule      out MI and obtain an EKG.  2.  Hypertension. Continue current therapy.  3.  Hypothyroidism. Check TSH.      AVP/MEDQ  D:  06/19/2004  T:  06/19/2004  Job:  [id number]

## 2010-07-31 ENCOUNTER — Ambulatory Visit: Payer: Medicare Other | Admitting: Family Medicine

## 2010-07-31 ENCOUNTER — Encounter: Payer: Self-pay | Admitting: Family Medicine

## 2010-07-31 DIAGNOSIS — S82899A Other fracture of unspecified lower leg, initial encounter for closed fracture: Secondary | ICD-10-CM

## 2010-07-31 NOTE — Progress Notes (Signed)
77 year old female, f/u R talus fracture, anterior:   Subjective:    Patient ID: Jean [name], female    DOB: October 28, 1928, 77 y.o.   MRN: [id number]  HPI  Now back to baseline, doing great.  RIGHT anterior talar bone fracture. The patient was immobilized in a Cam Walker boot for one month.  Review of Systems  REVIEW OF SYSTEMS  GEN: No fevers, chills. Nontoxic. Primarily MSK c/o today. MSK: Detailed in the HPI GI: tolerating PO intake without difficulty Neuro: No numbness, parasthesias, or tingling associated. Otherwise the pertinent positives of the ROS are noted above.      Objective:   Physical Exam   GEN: WDWN, NAD, Non-toxic, A & O x 3 HEENT: Atraumatic, Normocephalic. Neck supple. No masses, No LAD. Ears and Nose: No external deformity. PSYCH: Normally interactive. Conversant. Not depressed or anxious appearing.  Calm demeanor.   RIGHT anterior talus to no pain. No swelling throughout ankle. ROM full, str 5/5.   Assessment & Plan:  Doing great, work on proprioception, o/w f/u prn  A UNIVERSAL FRACTURE CHARGE HAS BEEN APPLIED IN THE CARE OF THIS INJURY.   No charge.

## 2010-07-31 NOTE — Patient Instructions (Signed)
Balance Exercises:  While brushing teeth, practice standing on 1 foot. Do for as long as you can, ideally 30 seconds to 1 minute each foot.   

## 2010-08-15 ENCOUNTER — Other Ambulatory Visit: Payer: Medicare Other

## 2010-08-15 ENCOUNTER — Other Ambulatory Visit: Payer: Self-pay | Admitting: Internal Medicine

## 2010-08-15 DIAGNOSIS — I2589 Other forms of chronic ischemic heart disease: Secondary | ICD-10-CM

## 2010-08-15 DIAGNOSIS — E039 Hypothyroidism, unspecified: Secondary | ICD-10-CM

## 2010-08-15 DIAGNOSIS — T887XXA Unspecified adverse effect of drug or medicament, initial encounter: Secondary | ICD-10-CM

## 2010-08-16 ENCOUNTER — Other Ambulatory Visit (INDEPENDENT_AMBULATORY_CARE_PROVIDER_SITE_OTHER): Payer: Medicare Other

## 2010-08-16 DIAGNOSIS — T887XXA Unspecified adverse effect of drug or medicament, initial encounter: Secondary | ICD-10-CM

## 2010-08-16 DIAGNOSIS — E039 Hypothyroidism, unspecified: Secondary | ICD-10-CM

## 2010-08-16 DIAGNOSIS — I2589 Other forms of chronic ischemic heart disease: Secondary | ICD-10-CM

## 2010-08-16 LAB — BASIC METABOLIC PANEL
Calcium: 9.3 mg/dL (ref 8.4–10.5)
GFR: 35.76 mL/min — ABNORMAL LOW (ref >60.00)
Potassium: 4.3 mEq/L (ref 3.5–5.1)
Sodium: 142 mEq/L (ref 135–145)

## 2010-08-16 LAB — TSH: TSH: 1.18 u[IU]/mL (ref 0.35–5.50)

## 2010-08-16 LAB — LIPID PANEL
HDL: 58.8 mg/dL (ref >39.00)
LDL Cholesterol: 105 mg/dL — ABNORMAL HIGH (ref 0–99)
Total CHOL/HDL Ratio: 3
VLDL: 24.4 mg/dL (ref 0.0–40.0)

## 2010-08-16 LAB — HEPATIC FUNCTION PANEL
Alkaline Phosphatase: 73 U/L (ref 39–117)
Bilirubin, Direct: 0.1 mg/dL (ref 0.0–0.3)

## 2010-08-19 ENCOUNTER — Encounter: Payer: Self-pay | Admitting: Internal Medicine

## 2010-08-19 ENCOUNTER — Ambulatory Visit (INDEPENDENT_AMBULATORY_CARE_PROVIDER_SITE_OTHER): Payer: Medicare Other | Admitting: Internal Medicine

## 2010-08-19 DIAGNOSIS — E785 Hyperlipidemia, unspecified: Secondary | ICD-10-CM

## 2010-08-19 DIAGNOSIS — I10 Essential (primary) hypertension: Secondary | ICD-10-CM

## 2010-08-19 DIAGNOSIS — I2510 Atherosclerotic heart disease of native coronary artery without angina pectoris: Secondary | ICD-10-CM

## 2010-08-19 DIAGNOSIS — M25579 Pain in unspecified ankle and joints of unspecified foot: Secondary | ICD-10-CM

## 2010-08-19 MED ORDER — METOPROLOL TARTRATE 100 MG PO TABS: 150.0000 mg | ORAL_TABLET | Freq: Two times a day (BID) | ORAL | Status: DC

## 2010-08-19 MED ORDER — CLOPIDOGREL BISULFATE 75 MG PO TABS: 75.0000 mg | ORAL_TABLET | Freq: Every day | ORAL | Status: DC

## 2010-08-19 MED ORDER — PRAVASTATIN SODIUM 20 MG PO TABS: 20.0000 mg | ORAL_TABLET | Freq: Two times a day (BID) | ORAL | Status: DC

## 2010-08-19 NOTE — Assessment & Plan Note (Signed)
On Rx 

## 2010-08-19 NOTE — Assessment & Plan Note (Signed)
Cont Rx 

## 2010-08-20 NOTE — Assessment & Plan Note (Signed)
Cont Rx 

## 2010-08-20 NOTE — Progress Notes (Signed)
  Subjective:    Patient ID: Jean [name], female    DOB: 17-Jul-1928, 77 y.o.   MRN: [id number]  HPI  The patient presents for a follow-up of  chronic hypertension, chronic dyslipidemia, CAD controlled with medicines    Review of Systems  Constitutional: Negative for chills, activity change, appetite change, fatigue and unexpected weight change.  HENT: Negative for congestion, mouth sores and sinus pressure.   Eyes: Negative for visual disturbance.  Respiratory: Negative for cough and chest tightness.   Gastrointestinal: Negative for nausea and abdominal pain.  Genitourinary: Negative for frequency, difficulty urinating and vaginal pain.  Musculoskeletal: Positive for gait problem (foot bain is better). Negative for back pain.  Skin: Negative for pallor and rash.  Neurological: Negative for dizziness, tremors, weakness, numbness and headaches.  Psychiatric/Behavioral: Negative for confusion and sleep disturbance.       Objective:   Physical Exam  Constitutional: She appears well-developed and well-nourished. No distress.  HENT:  Head: Normocephalic.  Right Ear: External ear normal.  Left Ear: External ear normal.  Nose: Nose normal.  Mouth/Throat: Oropharynx is clear and moist.  Eyes: Conjunctivae are normal. Pupils are equal, round, and reactive to light. Right eye exhibits no discharge. Left eye exhibits no discharge.  Neck: Normal range of motion. Neck supple. No JVD present. No tracheal deviation present. No thyromegaly present.  Cardiovascular: Normal rate, regular rhythm and normal heart sounds.   Pulmonary/Chest: No stridor. No respiratory distress. She has no wheezes.  Abdominal: Soft. Bowel sounds are normal. She exhibits no distension and no mass. There is no tenderness. There is no rebound and no guarding.  Musculoskeletal: She exhibits no edema and no tenderness.  Lymphadenopathy:    She has no cervical adenopathy.  Neurological: She displays normal reflexes. No  cranial nerve deficit. She exhibits normal muscle tone. Coordination normal.  Skin: No rash noted. No erythema.  Psychiatric: She has a normal mood and affect. Her behavior is normal. Judgment and thought content normal.          Assessment & Plan:

## 2010-08-20 NOTE — Assessment & Plan Note (Signed)
Better  

## 2010-12-09 ENCOUNTER — Other Ambulatory Visit: Payer: Self-pay | Admitting: Internal Medicine

## 2010-12-23 ENCOUNTER — Telehealth: Payer: Self-pay | Admitting: Internal Medicine

## 2010-12-23 ENCOUNTER — Other Ambulatory Visit: Payer: Self-pay | Admitting: Internal Medicine

## 2010-12-23 ENCOUNTER — Other Ambulatory Visit (INDEPENDENT_AMBULATORY_CARE_PROVIDER_SITE_OTHER): Payer: Medicare Other

## 2010-12-23 DIAGNOSIS — I10 Essential (primary) hypertension: Secondary | ICD-10-CM

## 2010-12-23 DIAGNOSIS — E785 Hyperlipidemia, unspecified: Secondary | ICD-10-CM

## 2010-12-23 DIAGNOSIS — I2510 Atherosclerotic heart disease of native coronary artery without angina pectoris: Secondary | ICD-10-CM

## 2010-12-23 DIAGNOSIS — M25579 Pain in unspecified ankle and joints of unspecified foot: Secondary | ICD-10-CM

## 2010-12-23 LAB — COMPREHENSIVE METABOLIC PANEL
Albumin: 3.9 g/dL (ref 3.5–5.2)
Alkaline Phosphatase: 73 U/L (ref 39–117)
BUN: 28 mg/dL — ABNORMAL HIGH (ref 6–23)
CO2: 28 mEq/L (ref 19–32)
Calcium: 9.1 mg/dL (ref 8.4–10.5)
Chloride: 107 mEq/L (ref 96–112)
GFR: 42.96 mL/min — ABNORMAL LOW (ref >60.00)
Glucose, Bld: 91 mg/dL (ref 70–99)
Potassium: 4.1 mEq/L (ref 3.5–5.1)
Sodium: 143 mEq/L (ref 135–145)
Total Protein: 7.4 g/dL (ref 6.0–8.3)

## 2010-12-23 LAB — URINALYSIS, ROUTINE W REFLEX MICROSCOPIC
Bilirubin Urine: NEGATIVE
Nitrite: POSITIVE
Total Protein, Urine: NEGATIVE
Urobilinogen, UA: 0.2 (ref 0.0–1.0)

## 2010-12-23 NOTE — Telephone Encounter (Signed)
Pls cx urine Thx

## 2010-12-24 ENCOUNTER — Ambulatory Visit: Payer: Medicare Other

## 2010-12-24 DIAGNOSIS — N390 Urinary tract infection, site not specified: Secondary | ICD-10-CM

## 2010-12-24 NOTE — Telephone Encounter (Signed)
Spoke to [place] in lab- Lab advised ok to culture urine. Add on faxed down to lab.

## 2010-12-25 ENCOUNTER — Ambulatory Visit (INDEPENDENT_AMBULATORY_CARE_PROVIDER_SITE_OTHER): Payer: Medicare Other | Admitting: Internal Medicine

## 2010-12-25 ENCOUNTER — Encounter: Payer: Self-pay | Admitting: Internal Medicine

## 2010-12-25 VITALS — BP 130/82 | HR 68 | Temp 98.4°F | Resp 16 | Wt 150.0 lb

## 2010-12-25 DIAGNOSIS — I10 Essential (primary) hypertension: Secondary | ICD-10-CM

## 2010-12-25 DIAGNOSIS — N259 Disorder resulting from impaired renal tubular function, unspecified: Secondary | ICD-10-CM

## 2010-12-25 DIAGNOSIS — Z23 Encounter for immunization: Secondary | ICD-10-CM

## 2010-12-25 DIAGNOSIS — M1990 Unspecified osteoarthritis, unspecified site: Secondary | ICD-10-CM

## 2010-12-25 DIAGNOSIS — E039 Hypothyroidism, unspecified: Secondary | ICD-10-CM

## 2010-12-25 DIAGNOSIS — I2510 Atherosclerotic heart disease of native coronary artery without angina pectoris: Secondary | ICD-10-CM

## 2010-12-25 NOTE — Progress Notes (Signed)
  Subjective:    Patient ID: Jean [name], female    DOB: 1928/06/24, 77 y.o.   MRN: [id number]  HPI  The patient presents for a follow-up of  chronic hypertension, chronic dyslipidemia, CRI, CAD controlled with medicines    Review of Systems  Constitutional: Negative for chills, activity change, appetite change, fatigue and unexpected weight change.  HENT: Negative for congestion, mouth sores and sinus pressure.   Eyes: Negative for visual disturbance.  Respiratory: Negative for cough and chest tightness.   Gastrointestinal: Negative for nausea and abdominal pain.  Genitourinary: Negative for frequency, difficulty urinating and vaginal pain.  Musculoskeletal: Positive for back pain and gait problem.  Skin: Negative for pallor and rash.  Neurological: Negative for dizziness, tremors, weakness, numbness and headaches.  Psychiatric/Behavioral: Negative for confusion and sleep disturbance.       Objective:   Physical Exam  Constitutional: She appears well-developed and well-nourished. No distress.  HENT:  Head: Normocephalic.  Right Ear: External ear normal.  Left Ear: External ear normal.  Nose: Nose normal.  Mouth/Throat: Oropharynx is clear and moist.  Eyes: Conjunctivae are normal. Pupils are equal, round, and reactive to light. Right eye exhibits no discharge. Left eye exhibits no discharge.  Neck: Normal range of motion. Neck supple. No JVD present. No tracheal deviation present. No thyromegaly present.  Cardiovascular: Normal rate, regular rhythm and normal heart sounds.   Pulmonary/Chest: No stridor. No respiratory distress. She has no wheezes.  Abdominal: Soft. Bowel sounds are normal. She exhibits no distension and no mass. There is no tenderness. There is no rebound and no guarding.  Musculoskeletal: She exhibits no edema and no tenderness.  Lymphadenopathy:    She has no cervical adenopathy.  Neurological: She displays normal reflexes. No cranial nerve deficit. She  exhibits normal muscle tone. Coordination normal.  Skin: No rash noted. No erythema.  Psychiatric: She has a normal mood and affect. Her behavior is normal. Judgment and thought content normal.    Lab Results  Component Value Date   WBC 6.1 11/22/2009   HGB 10.9* 11/22/2009   HCT 33.6* 11/22/2009   PLT 163 11/22/2009   GLUCOSE 91 12/23/2010   CHOL 188 08/16/2010   TRIG 122.0 08/16/2010   HDL 58.80 08/16/2010   LDLDIRECT 123.1 06/01/2009   LDLCALC 105* 08/16/2010   ALT 15 12/23/2010   AST 29 12/23/2010   NA 143 12/23/2010   K 4.1 12/23/2010   CL 107 12/23/2010   CREATININE 1.3* 12/23/2010   BUN 28* 12/23/2010   CO2 28 12/23/2010   TSH 1.18 08/16/2010   INR 1.0 ratio 11/15/2009   HGBA1C 6.0 06/23/2008   MICROALBUR 1.3 08/17/2006        Assessment & Plan:

## 2010-12-25 NOTE — Assessment & Plan Note (Signed)
Continue with current prescription therapy as reflected on the Med list.  

## 2010-12-25 NOTE — Assessment & Plan Note (Signed)
Continue with current OTC therapy as reflected on the Med list.  

## 2010-12-25 NOTE — Assessment & Plan Note (Signed)
Continue with current prescription therapy as reflected on the Med list. Creat is better

## 2010-12-25 NOTE — Assessment & Plan Note (Signed)
Chronic. No angina Continue with current prescription therapy as reflected on the Med list.  

## 2010-12-27 LAB — URINE CULTURE

## 2011-03-05 ENCOUNTER — Encounter: Payer: Self-pay | Admitting: Cardiovascular Disease

## 2011-03-05 ENCOUNTER — Ambulatory Visit (INDEPENDENT_AMBULATORY_CARE_PROVIDER_SITE_OTHER): Payer: Medicare Other | Admitting: Cardiovascular Disease

## 2011-03-05 DIAGNOSIS — M25559 Pain in unspecified hip: Secondary | ICD-10-CM

## 2011-03-05 DIAGNOSIS — I10 Essential (primary) hypertension: Secondary | ICD-10-CM

## 2011-03-05 DIAGNOSIS — I2510 Atherosclerotic heart disease of native coronary artery without angina pectoris: Secondary | ICD-10-CM

## 2011-03-05 DIAGNOSIS — E785 Hyperlipidemia, unspecified: Secondary | ICD-10-CM

## 2011-03-05 MED ORDER — TRAMADOL HCL 50 MG PO TABS: 50.0000 mg | ORAL_TABLET | Freq: Four times a day (QID) | ORAL | Status: AC | PRN

## 2011-03-05 NOTE — Assessment & Plan Note (Signed)
Cholesterol is at goal.  Continue current dose of statin and diet Rx.  No myalgias or side effects.  F/U  LFT's in 6 months. Lab Results  Component Value Date   LDLCALC 105* 08/16/2010

## 2011-03-05 NOTE — Assessment & Plan Note (Signed)
Well controlled.  Continue current medications and low sodium Dash type diet.    

## 2011-03-05 NOTE — Assessment & Plan Note (Signed)
Stable with no angina and good activity level.  Continue medical Rx  

## 2011-03-05 NOTE — Patient Instructions (Signed)
Please take Tramadol as needed for pain.  The current medical regimen is effective;  continue present plan and medications.  Follow up in 1 year with Dr Eden Emms.  You will receive a letter in the mail 2 months before you are due.  Please call us when you receive this letter to schedule your follow up appointment.

## 2011-03-05 NOTE — Progress Notes (Signed)
Jean [name] is seen today post LAD intervention 9/11. She has had recent circ infarct with restenosis and stenting 6/11 with staged intervention to LAD 3 months latter. EF is normal. No angina and compliant with antiplatlet drugs. Leg has healed well. Ambulatorywith no dyspnea, palpitations edema or syncope. CRF's well modified and on statin  Has significant pain on left side.  Seems more like a hip than back issue  ROS: Denies fever, malais, weight loss, blurry vision, decreased visual acuity, cough, sputum, SOB, hemoptysis, pleuritic pain, palpitaitons, heartburn, abdominal pain, melena, lower extremity edema, claudication, or rash.  All other systems reviewed and negative  General: Affect appropriate Healthy:  appears stated age HEENT: normal Neck supple with no adenopathy JVP normal no bruits no thyromegaly Lungs clear with no wheezing and good diaphragmatic motion Heart:  S1/S2 no murmur,rub, gallop or click PMI normal Abdomen: benighn, BS positve, no tenderness, no AAA no bruit.  No HSM or HJR Distal pulses intact with no bruits No edema Neuro non-focal Skin warm and dry No muscular weakness   Current Outpatient Prescriptions  Medication Sig Dispense Refill  . aspirin 325 MG tablet Take 325 mg by mouth daily.        . clopidogrel (PLAVIX) 75 MG tablet Take 1 tablet (75 mg total) by mouth daily.  90 tablet  3  . Coenzyme Q10 (CO Q 10 PO) Take by mouth daily.        Marland Kitchen levothyroxine (SYNTHROID, LEVOTHROID) 75 MCG tablet Take 75 mcg by mouth daily.        . meclizine (ANTIVERT) 25 MG tablet Take 25 mg by mouth 3 (three) times daily as needed.        . methyldopa (ALDOMET) 250 MG tablet Take 250 mg by mouth 2 (two) times daily.        . metoprolol (LOPRESSOR) 100 MG tablet TAKE ONE AND ONE-HALF TABLETS TWICE A DAY  90 tablet  2  . nitroGLYCERIN (NITROSTAT) 0.4 MG SL tablet Place 0.4 mg under the tongue every 5 (five) minutes as needed.        . pravastatin (PRAVACHOL) 20 MG tablet Take 1  tablet (20 mg total) by mouth 2 (two) times daily.  180 tablet  3    Allergies  Ezetimibe-simvastatin; Penicillins; and Rosuvastatin  Electrocardiogram:  NSR rate 52  PR 224  LAD nonspecific ST/T wave changes  Assessment and Plan

## 2011-03-05 NOTE — Assessment & Plan Note (Signed)
Pain seems localized to left iliac crest.  Not sure that [facility] is the correct referral.  Would consider standard 3 view hip.  Called in tramadol F/U primary

## 2011-04-11 ENCOUNTER — Other Ambulatory Visit: Payer: Self-pay | Admitting: *Deleted

## 2011-04-11 MED ORDER — METOPROLOL TARTRATE 100 MG PO TABS: 150.0000 mg | ORAL_TABLET | Freq: Two times a day (BID) | ORAL | Status: DC

## 2011-04-15 ENCOUNTER — Other Ambulatory Visit (HOSPITAL_COMMUNITY): Payer: Self-pay | Admitting: Neurological Surgery

## 2011-04-15 DIAGNOSIS — M545 Low back pain: Secondary | ICD-10-CM

## 2011-04-18 ENCOUNTER — Ambulatory Visit (HOSPITAL_COMMUNITY)
Admission: RE | Admit: 2011-04-18 | Discharge: 2011-04-18 | Disposition: A | Discharge status: Home / Self Care | Payer: Medicare Other | Source: Ambulatory Visit | Attending: Neurological Surgery | Admitting: Neurological Surgery

## 2011-04-18 DIAGNOSIS — M545 Low back pain, unspecified: Secondary | ICD-10-CM | POA: Insufficient documentation

## 2011-04-18 DIAGNOSIS — M79609 Pain in unspecified limb: Secondary | ICD-10-CM | POA: Insufficient documentation

## 2011-04-18 DIAGNOSIS — IMO0002 Reserved for concepts with insufficient information to code with codable children: Secondary | ICD-10-CM | POA: Insufficient documentation

## 2011-04-18 DIAGNOSIS — M5137 Other intervertebral disc degeneration, lumbosacral region: Secondary | ICD-10-CM | POA: Insufficient documentation

## 2011-04-18 DIAGNOSIS — M51379 Other intervertebral disc degeneration, lumbosacral region without mention of lumbar back pain or lower extremity pain: Secondary | ICD-10-CM | POA: Insufficient documentation

## 2011-04-18 DIAGNOSIS — M48061 Spinal stenosis, lumbar region without neurogenic claudication: Secondary | ICD-10-CM | POA: Insufficient documentation

## 2011-04-18 DIAGNOSIS — M47817 Spondylosis without myelopathy or radiculopathy, lumbosacral region: Secondary | ICD-10-CM | POA: Insufficient documentation

## 2011-04-18 LAB — CREATININE, SERUM
Creatinine, Ser: 1.26 mg/dL — ABNORMAL HIGH (ref 0.50–1.10)
GFR calc Af Amer: 45 mL/min — ABNORMAL LOW (ref >90)
GFR calc non Af Amer: 39 mL/min — ABNORMAL LOW (ref >90)

## 2011-04-18 MED ORDER — GADOBENATE DIMEGLUMINE 529 MG/ML IV SOLN
10.0000 mL | Freq: Once | INTRAVENOUS | Status: AC
  Administered 2011-04-18: 10 mL via INTRAVENOUS

## 2011-04-21 MED ORDER — GADOBENATE DIMEGLUMINE 529 MG/ML IV SOLN: 10.0000 mL | Freq: Once | INTRAVENOUS | Status: AC | PRN

## 2011-04-23 ENCOUNTER — Ambulatory Visit: Payer: Medicare Other | Admitting: Internal Medicine

## 2011-04-23 DIAGNOSIS — Z0289 Encounter for other administrative examinations: Secondary | ICD-10-CM

## 2011-05-08 ENCOUNTER — Other Ambulatory Visit: Payer: Self-pay | Admitting: Neurological Surgery

## 2011-05-13 ENCOUNTER — Encounter (HOSPITAL_COMMUNITY): Payer: Self-pay | Admitting: Pharmacy Technician

## 2011-05-16 ENCOUNTER — Ambulatory Visit (HOSPITAL_COMMUNITY)
Admission: RE | Admit: 2011-05-16 | Discharge: 2011-05-16 | Disposition: A | Discharge status: Home / Self Care | Payer: Medicare Other | Source: Ambulatory Visit | Attending: Anesthesiology | Admitting: Anesthesiology

## 2011-05-16 ENCOUNTER — Encounter (HOSPITAL_COMMUNITY): Payer: Self-pay

## 2011-05-16 ENCOUNTER — Encounter (HOSPITAL_COMMUNITY)
Admission: RE | Admit: 2011-05-16 | Discharge: 2011-05-16 | Disposition: A | Discharge status: Home / Self Care | Payer: Medicare Other | Source: Ambulatory Visit | Attending: Neurological Surgery | Admitting: Neurological Surgery

## 2011-05-16 DIAGNOSIS — Z01812 Encounter for preprocedural laboratory examination: Secondary | ICD-10-CM | POA: Insufficient documentation

## 2011-05-16 DIAGNOSIS — Z01818 Encounter for other preprocedural examination: Secondary | ICD-10-CM | POA: Insufficient documentation

## 2011-05-16 HISTORY — DX: Acute myocardial infarction, unspecified: I21.9

## 2011-05-16 LAB — CBC
MCH: 29.9 pg (ref 26.0–34.0)
MCHC: 33.6 g/dL (ref 30.0–36.0)
MCV: 89 fL (ref 78.0–100.0)
Platelets: 183 10*3/uL (ref 150–400)
RDW: 12.9 % (ref 11.5–15.5)

## 2011-05-16 LAB — BASIC METABOLIC PANEL
CO2: 26 mEq/L (ref 19–32)
Calcium: 9.4 mg/dL (ref 8.4–10.5)
Creatinine, Ser: 1.51 mg/dL — ABNORMAL HIGH (ref 0.50–1.10)
Glucose, Bld: 81 mg/dL (ref 70–99)

## 2011-05-16 NOTE — Pre-Procedure Instructions (Signed)
20 Jean [name]  05/16/2011   Your procedure is scheduled on: May 23, 2011  Report to [facility] at 0730 AM.  Call this number if you have problems the morning of surgery: [phone]   Remember:   Do not eat food:After Midnight.  May have clear liquids: up to 4 Hours before arrival.  Clear liquids include soda, tea, black coffee, apple or grape juice, broth.  Take these medicines the morning of surgery with A SIP OF WATER: Levothyroxine, Aldoment, Metoprolol,    STOP Aspirin, Plavix, CO Q 10 today  Do not wear jewelry, make-up or nail polish.  Do not wear lotions, powders, or perfumes. You may wear deodorant.  Do not shave 48 hours prior to surgery.  Do not bring valuables to the hospital.  Contacts, dentures or bridgework may not be worn into surgery.  Leave suitcase in the car. After surgery it may be brought to your room.  For patients admitted to the hospital, checkout time is 11:00 AM the day of discharge.   Patients discharged the day of surgery will not be allowed to drive home.    Special Instructions: CHG Shower Use Special Wash: 1/2 bottle night before surgery and 1/2 bottle morning of surgery.   Please read over the following fact sheets that you were given: Pain Booklet, Coughing and Deep Breathing, MRSA Information and Surgical Site Infection Prevention

## 2011-05-23 ENCOUNTER — Inpatient Hospital Stay (HOSPITAL_COMMUNITY): Admission: RE | Admit: 2011-05-23 | Payer: Medicare Other | Source: Ambulatory Visit | Admitting: Neurological Surgery

## 2011-05-23 ENCOUNTER — Encounter (HOSPITAL_COMMUNITY): Admission: RE | Payer: Self-pay | Source: Ambulatory Visit

## 2011-05-23 SURGERY — POSTERIOR LUMBAR FUSION 1 LEVEL
Anesthesia: General | Site: Back

## 2011-05-24 ENCOUNTER — Other Ambulatory Visit: Payer: Self-pay | Admitting: Internal Medicine

## 2011-06-04 ENCOUNTER — Encounter: Payer: Self-pay | Admitting: Internal Medicine

## 2011-06-04 ENCOUNTER — Ambulatory Visit (INDEPENDENT_AMBULATORY_CARE_PROVIDER_SITE_OTHER): Payer: Medicare Other | Admitting: Internal Medicine

## 2011-06-04 VITALS — BP 152/78 | HR 54 | Temp 97.5°F | Wt 148.0 lb

## 2011-06-04 DIAGNOSIS — M545 Low back pain, unspecified: Secondary | ICD-10-CM | POA: Insufficient documentation

## 2011-06-04 DIAGNOSIS — E559 Vitamin D deficiency, unspecified: Secondary | ICD-10-CM

## 2011-06-04 DIAGNOSIS — I10 Essential (primary) hypertension: Secondary | ICD-10-CM

## 2011-06-04 DIAGNOSIS — E039 Hypothyroidism, unspecified: Secondary | ICD-10-CM

## 2011-06-04 DIAGNOSIS — E785 Hyperlipidemia, unspecified: Secondary | ICD-10-CM

## 2011-06-04 DIAGNOSIS — I2510 Atherosclerotic heart disease of native coronary artery without angina pectoris: Secondary | ICD-10-CM

## 2011-06-04 DIAGNOSIS — N259 Disorder resulting from impaired renal tubular function, unspecified: Secondary | ICD-10-CM

## 2011-06-04 MED ORDER — VITAMIN D 1000 UNITS PO TABS: 1000.0000 [IU] | ORAL_TABLET | Freq: Every day | ORAL | Status: DC

## 2011-06-04 MED ORDER — TRAMADOL HCL 50 MG PO TABS: 25.0000 mg | ORAL_TABLET | Freq: Three times a day (TID) | ORAL | Status: AC | PRN

## 2011-06-04 NOTE — Assessment & Plan Note (Signed)
Continue with current prescription therapy as reflected on the Med list.  

## 2011-06-04 NOTE — Progress Notes (Signed)
Patient ID: [name], female   DOB: 31-Dec-1928, 78 y.o.   MRN: [id number]  Subjective:    Patient ID: Jean [name], female    DOB: [birth year], 78 y.o.   MRN: [id number]  HPI C/o severe LBP; surgery was denied and she is required to do PT now  The patient presents for a follow-up of  chronic hypertension, chronic dyslipidemia, CRI, CAD controlled with medicines    Review of Systems  Constitutional: Negative for chills, activity change, appetite change, fatigue and unexpected weight change.  HENT: Negative for congestion, mouth sores and sinus pressure.   Eyes: Negative for visual disturbance.  Respiratory: Negative for cough and chest tightness.   Gastrointestinal: Negative for nausea and abdominal pain.  Genitourinary: Negative for frequency, difficulty urinating and vaginal pain.  Musculoskeletal: Positive for back pain and gait problem.  Skin: Negative for pallor and rash.  Neurological: Negative for dizziness, tremors, weakness, numbness and headaches.  Psychiatric/Behavioral: Negative for confusion and sleep disturbance.       Objective:   Physical Exam  Constitutional: She appears well-developed and well-nourished. No distress.  HENT:  Head: Normocephalic.  Right Ear: External ear normal.  Left Ear: External ear normal.  Nose: Nose normal.  Mouth/Throat: Oropharynx is clear and moist.  Eyes: Conjunctivae are normal. Pupils are equal, round, and reactive to light. Right eye exhibits no discharge. Left eye exhibits no discharge.  Neck: Normal range of motion. Neck supple. No JVD present. No tracheal deviation present. No thyromegaly present.  Cardiovascular: Normal rate, regular rhythm and normal heart sounds.   Pulmonary/Chest: No stridor. No respiratory distress. She has no wheezes.  Abdominal: Soft. Bowel sounds are normal. She exhibits no distension and no mass. There is no tenderness. There is no rebound and no guarding.  Musculoskeletal: She exhibits tenderness  (LS spine is tender). She exhibits no edema.  Lymphadenopathy:    She has no cervical adenopathy.  Neurological: She displays normal reflexes. No cranial nerve deficit. She exhibits normal muscle tone. Coordination normal.  Skin: No rash noted. No erythema.  Psychiatric: She has a normal mood and affect. Her behavior is normal. Judgment and thought content normal.  Slow, weak legs  Lab Results  Component Value Date   WBC 7.3 05/16/2011   HGB 13.1 05/16/2011   HCT 39.0 05/16/2011   PLT 183 05/16/2011   GLUCOSE 81 05/16/2011   CHOL 188 08/16/2010   TRIG 122.0 08/16/2010   HDL 58.80 08/16/2010   LDLDIRECT 123.1 06/01/2009   LDLCALC 105* 08/16/2010   ALT 15 12/23/2010   AST 29 12/23/2010   NA 139 05/16/2011   K 3.7 05/16/2011   CL 102 05/16/2011   CREATININE 1.51* 05/16/2011   BUN 31* 05/16/2011   CO2 26 05/16/2011   TSH 1.18 08/16/2010   INR 1.0 ratio 11/15/2009   HGBA1C 6.0 06/23/2008   MICROALBUR 1.3 08/17/2006        Assessment & Plan:

## 2011-06-04 NOTE — Assessment & Plan Note (Signed)
Surgery by Dr Danielle Dess is planned - in PT now

## 2011-06-04 NOTE — Assessment & Plan Note (Signed)
Monitoring

## 2011-06-05 ENCOUNTER — Encounter: Payer: Self-pay | Admitting: Internal Medicine

## 2011-06-16 ENCOUNTER — Encounter (HOSPITAL_COMMUNITY): Payer: Self-pay | Admitting: Pharmacy Technician

## 2011-06-16 ENCOUNTER — Other Ambulatory Visit: Payer: Self-pay | Admitting: Neurological Surgery

## 2011-06-20 ENCOUNTER — Encounter (HOSPITAL_COMMUNITY)
Admission: RE | Admit: 2011-06-20 | Discharge: 2011-06-20 | Disposition: A | Discharge status: Home / Self Care | Payer: Medicare Other | Source: Ambulatory Visit | Attending: Neurological Surgery | Admitting: Neurological Surgery

## 2011-06-20 ENCOUNTER — Encounter (HOSPITAL_COMMUNITY): Payer: Self-pay

## 2011-06-20 LAB — BASIC METABOLIC PANEL
BUN: 24 mg/dL — ABNORMAL HIGH (ref 6–23)
Chloride: 104 mEq/L (ref 96–112)
GFR calc Af Amer: 41 mL/min — ABNORMAL LOW (ref >90)
Potassium: 4.4 mEq/L (ref 3.5–5.1)

## 2011-06-20 LAB — CBC
HCT: 37.6 % (ref 36.0–46.0)
Hemoglobin: 12.7 g/dL (ref 12.0–15.0)
MCHC: 33.8 g/dL (ref 30.0–36.0)
WBC: 6.4 10*3/uL (ref 4.0–10.5)

## 2011-06-20 LAB — SURGICAL PCR SCREEN
MRSA, PCR: NEGATIVE
Staphylococcus aureus: NEGATIVE

## 2011-06-20 NOTE — Pre-Procedure Instructions (Signed)
20 SANDRIKA SCHWINN  06/20/2011   Your procedure is scheduled on:  Monday June 23, 2011  Report to [facility] at 0530 AM.  Call this number if you have problems the morning of surgery: [phone]   Remember:   Do not eat food:After Midnight.  May have clear liquids: up to 4 Hours before arrival. (up to 1:30am)  Clear liquids include soda, tea, black coffee, apple or grape juice, broth.  Take these medicines the morning of surgery with A SIP OF WATER: synthroid metoprolol,methyldopa   Do not wear jewelry, make-up or nail polish.  Do not wear lotions, powders, or perfumes. You may wear deodorant.  Do not shave 48 hours prior to surgery.  Do not bring valuables to the hospital.  Contacts, dentures or bridgework may not be worn into surgery.  Leave suitcase in the car. After surgery it may be brought to your room.  For patients admitted to the hospital, checkout time is 11:00 AM the day of discharge.   Patients discharged the day of surgery will not be allowed to drive home.  Name and phone number of your driver: Avari Nevares [phone]  Special Instructions: CHG Shower Use Special Wash: 1/2 bottle night before surgery and 1/2 bottle morning of surgery.   Please read over the following fact sheets that you were given: Pain Booklet, Coughing and Deep Breathing, Blood Transfusion Information, MRSA Information and Surgical Site Infection Prevention

## 2011-06-20 NOTE — Consult Note (Signed)
Anesthesia Chart Review:  Patient is a 78 year old female scheduled for a L2-3 decompression, fusion, hardware removal on 06/23/11.  History includes non-smoker, HLD, CAD/MI s/p stents to LAD (1/06, 9/11) & CX (7/11), CHF, hyperglycemia, anemia, CVA, hypothyroidism, HTN,   Moderate MR/TR and pulmonary HTN by 08/2009 echo.  PCP is Dr. Posey Rea who is aware of the planned procedure.  Her Cardiologist is Dr. Eden Emms who last saw her on 03/05/11.  EKG then showed SB, first degree AVB, LAD, non-specific ST/T wave abnormality (lateral leads).   Se was noted to be stable, no angina, and good activity tolerance.  It appears he actually referred Ms. Haviland to Dr. Danielle Dess for hip pain.    Her last cath from 11/21/09 showed successful percutaneous stenting (Promus DES) of the mid and distal LAD on either sides of the previously placed widely patent Cypher stents.  (See Notes tab for full report.)  Echo from 08/23/09 showed: - Normal LV size with midly decreased systolic function, EF 45%. The septal wall was mildly hypokinetic. The basal posterior wall was akinetic. There was severe diastolic dysfunction with evidence for elevated LV filling pressure. There was moderate pulmonary hypertension. The RV was not well-visualized but appeared mildly dilated and mildly hypokinetic. Trivial AR, Moderate MR/TR.  05/16/11 CXR showed no evidence of acute cardiopulmonary disease.  Labs acceptable.  If no acute CV symptoms, then anticipate she can proceed.

## 2011-06-22 MED ORDER — VANCOMYCIN HCL IN DEXTROSE 1-5 GM/200ML-% IV SOLN
1000.0000 mg | INTRAVENOUS | Status: DC
  Filled 2011-06-22: qty 200

## 2011-06-23 ENCOUNTER — Encounter (HOSPITAL_COMMUNITY): Payer: Self-pay | Admitting: Vascular Surgery

## 2011-06-23 ENCOUNTER — Inpatient Hospital Stay (HOSPITAL_COMMUNITY): Payer: Medicare Other | Admitting: Vascular Surgery

## 2011-06-23 ENCOUNTER — Encounter (HOSPITAL_COMMUNITY)
Admission: RE | Disposition: A | Discharge status: Rehabilitation Facility | Payer: Self-pay | Source: Ambulatory Visit | Attending: Neurological Surgery

## 2011-06-23 ENCOUNTER — Inpatient Hospital Stay (HOSPITAL_COMMUNITY): Payer: Medicare Other

## 2011-06-23 ENCOUNTER — Encounter (HOSPITAL_COMMUNITY): Payer: Self-pay | Admitting: *Deleted

## 2011-06-23 ENCOUNTER — Inpatient Hospital Stay (HOSPITAL_COMMUNITY)
Admission: RE | Admit: 2011-06-23 | Discharge: 2011-07-02 | DRG: 456 | Disposition: A | Discharge status: Rehabilitation Facility | Payer: Medicare Other | Source: Ambulatory Visit | Attending: Neurological Surgery | Admitting: Neurological Surgery

## 2011-06-23 ENCOUNTER — Other Ambulatory Visit: Payer: Self-pay

## 2011-06-23 DIAGNOSIS — I6350 Cerebral infarction due to unspecified occlusion or stenosis of unspecified cerebral artery: Secondary | ICD-10-CM | POA: Diagnosis not present

## 2011-06-23 DIAGNOSIS — I079 Rheumatic tricuspid valve disease, unspecified: Secondary | ICD-10-CM | POA: Diagnosis present

## 2011-06-23 DIAGNOSIS — D62 Acute posthemorrhagic anemia: Secondary | ICD-10-CM | POA: Diagnosis not present

## 2011-06-23 DIAGNOSIS — F99 Mental disorder, not otherwise specified: Secondary | ICD-10-CM | POA: Diagnosis not present

## 2011-06-23 DIAGNOSIS — J811 Chronic pulmonary edema: Secondary | ICD-10-CM

## 2011-06-23 DIAGNOSIS — E876 Hypokalemia: Secondary | ICD-10-CM | POA: Diagnosis not present

## 2011-06-23 DIAGNOSIS — IMO0002 Reserved for concepts with insufficient information to code with codable children: Secondary | ICD-10-CM | POA: Diagnosis not present

## 2011-06-23 DIAGNOSIS — N179 Acute kidney failure, unspecified: Secondary | ICD-10-CM

## 2011-06-23 DIAGNOSIS — J9819 Other pulmonary collapse: Secondary | ICD-10-CM | POA: Diagnosis not present

## 2011-06-23 DIAGNOSIS — M1990 Unspecified osteoarthritis, unspecified site: Secondary | ICD-10-CM | POA: Diagnosis present

## 2011-06-23 DIAGNOSIS — N183 Chronic kidney disease, stage 3 unspecified: Secondary | ICD-10-CM | POA: Diagnosis present

## 2011-06-23 DIAGNOSIS — N259 Disorder resulting from impaired renal tubular function, unspecified: Secondary | ICD-10-CM

## 2011-06-23 DIAGNOSIS — D72829 Elevated white blood cell count, unspecified: Secondary | ICD-10-CM | POA: Diagnosis not present

## 2011-06-23 DIAGNOSIS — D6959 Other secondary thrombocytopenia: Secondary | ICD-10-CM | POA: Diagnosis not present

## 2011-06-23 DIAGNOSIS — Z8673 Personal history of transient ischemic attack (TIA), and cerebral infarction without residual deficits: Secondary | ICD-10-CM

## 2011-06-23 DIAGNOSIS — Z01812 Encounter for preprocedural laboratory examination: Secondary | ICD-10-CM

## 2011-06-23 DIAGNOSIS — Z472 Encounter for removal of internal fixation device: Secondary | ICD-10-CM

## 2011-06-23 DIAGNOSIS — Z9861 Coronary angioplasty status: Secondary | ICD-10-CM

## 2011-06-23 DIAGNOSIS — M48061 Spinal stenosis, lumbar region without neurogenic claudication: Secondary | ICD-10-CM | POA: Diagnosis present

## 2011-06-23 DIAGNOSIS — I2510 Atherosclerotic heart disease of native coronary artery without angina pectoris: Secondary | ICD-10-CM | POA: Diagnosis present

## 2011-06-23 DIAGNOSIS — M4120 Other idiopathic scoliosis, site unspecified: Principal | ICD-10-CM | POA: Diagnosis present

## 2011-06-23 DIAGNOSIS — Z79899 Other long term (current) drug therapy: Secondary | ICD-10-CM

## 2011-06-23 DIAGNOSIS — R7309 Other abnormal glucose: Secondary | ICD-10-CM | POA: Diagnosis present

## 2011-06-23 DIAGNOSIS — E785 Hyperlipidemia, unspecified: Secondary | ICD-10-CM | POA: Diagnosis present

## 2011-06-23 DIAGNOSIS — I498 Other specified cardiac arrhythmias: Secondary | ICD-10-CM | POA: Diagnosis not present

## 2011-06-23 DIAGNOSIS — I059 Rheumatic mitral valve disease, unspecified: Secondary | ICD-10-CM | POA: Diagnosis present

## 2011-06-23 DIAGNOSIS — I252 Old myocardial infarction: Secondary | ICD-10-CM

## 2011-06-23 DIAGNOSIS — I129 Hypertensive chronic kidney disease with stage 1 through stage 4 chronic kidney disease, or unspecified chronic kidney disease: Secondary | ICD-10-CM | POA: Diagnosis present

## 2011-06-23 DIAGNOSIS — Z7982 Long term (current) use of aspirin: Secondary | ICD-10-CM

## 2011-06-23 DIAGNOSIS — R05 Cough: Secondary | ICD-10-CM

## 2011-06-23 DIAGNOSIS — Z7902 Long term (current) use of antithrombotics/antiplatelets: Secondary | ICD-10-CM

## 2011-06-23 DIAGNOSIS — I509 Heart failure, unspecified: Secondary | ICD-10-CM | POA: Diagnosis present

## 2011-06-23 DIAGNOSIS — E039 Hypothyroidism, unspecified: Secondary | ICD-10-CM

## 2011-06-23 DIAGNOSIS — I10 Essential (primary) hypertension: Secondary | ICD-10-CM | POA: Insufficient documentation

## 2011-06-23 DIAGNOSIS — Y9210 Unspecified residential institution as the place of occurrence of the external cause: Secondary | ICD-10-CM | POA: Diagnosis not present

## 2011-06-23 DIAGNOSIS — Z9889 Other specified postprocedural states: Secondary | ICD-10-CM

## 2011-06-23 DIAGNOSIS — R410 Disorientation, unspecified: Secondary | ICD-10-CM

## 2011-06-23 DIAGNOSIS — S3500XA Unspecified injury of abdominal aorta, initial encounter: Secondary | ICD-10-CM

## 2011-06-23 DIAGNOSIS — E559 Vitamin D deficiency, unspecified: Secondary | ICD-10-CM | POA: Diagnosis present

## 2011-06-23 HISTORY — PX: LAPAROTOMY: SHX154

## 2011-06-23 LAB — CBC
HCT: 33.2 % — ABNORMAL LOW (ref 36.0–46.0)
Hemoglobin: 11.2 g/dL — ABNORMAL LOW (ref 12.0–15.0)
MCV: 86.7 fL (ref 78.0–100.0)
RBC: 3.83 MIL/uL — ABNORMAL LOW (ref 3.87–5.11)
RDW: 13.6 % (ref 11.5–15.5)
WBC: 13.4 10*3/uL — ABNORMAL HIGH (ref 4.0–10.5)

## 2011-06-23 LAB — PROTIME-INR: INR: 1.29 (ref 0.00–1.49)

## 2011-06-23 LAB — CARDIAC PANEL(CRET KIN+CKTOT+MB+TROPI)
Relative Index: 4.9 — ABNORMAL HIGH (ref 0.0–2.5)
Troponin I: <0.3 ng/mL (ref <0.30)

## 2011-06-23 LAB — GLUCOSE, CAPILLARY: Glucose-Capillary: 148 mg/dL — ABNORMAL HIGH (ref 70–99)

## 2011-06-23 LAB — BASIC METABOLIC PANEL
CO2: 19 mEq/L (ref 19–32)
Chloride: 110 mEq/L (ref 96–112)
Creatinine, Ser: 1.42 mg/dL — ABNORMAL HIGH (ref 0.50–1.10)
Potassium: 4.6 mEq/L (ref 3.5–5.1)

## 2011-06-23 SURGERY — ABDOMINAL EXPOSURE
Anesthesia: General

## 2011-06-23 SURGERY — POSTERIOR LUMBAR FUSION 1 WITH HARDWARE REMOVAL
Anesthesia: General | Site: Back | Wound class: Clean

## 2011-06-23 MED ORDER — HYDROMORPHONE HCL PF 1 MG/ML IJ SOLN
INTRAMUSCULAR | Status: AC
  Administered 2011-06-23: 0.25 mg via INTRAVENOUS
  Filled 2011-06-23: qty 1

## 2011-06-23 MED ORDER — MENTHOL 3 MG MT LOZG
1.0000 | LOZENGE | OROMUCOSAL | Status: DC | PRN
  Filled 2011-06-23: qty 9

## 2011-06-23 MED ORDER — PHENOL 1.4 % MT LIQD
1.0000 | OROMUCOSAL | Status: DC | PRN
  Filled 2011-06-23: qty 177

## 2011-06-23 MED ORDER — SODIUM CHLORIDE 0.9 % IJ SOLN
3.0000 mL | Freq: Two times a day (BID) | INTRAMUSCULAR | Status: DC
  Administered 2011-06-23: 23:00:00 via INTRAVENOUS
  Administered 2011-06-24 – 2011-07-02 (×16): 3 mL via INTRAVENOUS

## 2011-06-23 MED ORDER — ONDANSETRON HCL 4 MG/2ML IJ SOLN: 4.0000 mg | INTRAMUSCULAR | Status: DC | PRN

## 2011-06-23 MED ORDER — PROPOFOL 10 MG/ML IV EMUL
INTRAVENOUS | Status: DC | PRN
  Administered 2011-06-23: 120 mg via INTRAVENOUS

## 2011-06-23 MED ORDER — SODIUM CHLORIDE 0.9 % IV BOLUS (SEPSIS)
500.0000 mL | Freq: Once | INTRAVENOUS | Status: AC
  Administered 2011-06-23: 500 mL via INTRAVENOUS

## 2011-06-23 MED ORDER — DROPERIDOL 2.5 MG/ML IJ SOLN: 0.6250 mg | INTRAMUSCULAR | Status: DC | PRN

## 2011-06-23 MED ORDER — METOPROLOL TARTRATE 50 MG PO TABS
150.0000 mg | ORAL_TABLET | Freq: Two times a day (BID) | ORAL | Status: DC
  Filled 2011-06-23 (×3): qty 1

## 2011-06-23 MED ORDER — FENTANYL CITRATE 0.05 MG/ML IJ SOLN
INTRAMUSCULAR | Status: DC | PRN
Start: 2011-06-23 — End: 2011-06-23
  Administered 2011-06-23 (×2): 50 ug via INTRAVENOUS
  Administered 2011-06-23: 100 ug via INTRAVENOUS
  Administered 2011-06-23: 50 ug via INTRAVENOUS
  Administered 2011-06-23: 100 ug via INTRAVENOUS
  Administered 2011-06-23 (×3): 50 ug via INTRAVENOUS

## 2011-06-23 MED ORDER — LACTATED RINGERS IV SOLN
INTRAVENOUS | Status: DC | PRN
  Administered 2011-06-23 (×4): via INTRAVENOUS

## 2011-06-23 MED ORDER — THROMBIN 20000 UNITS EX KIT
PACK | CUTANEOUS | Status: DC | PRN
  Administered 2011-06-23 (×2): via TOPICAL

## 2011-06-23 MED ORDER — NITROGLYCERIN 0.4 MG SL SUBL: 0.4000 mg | SUBLINGUAL_TABLET | SUBLINGUAL | Status: DC | PRN

## 2011-06-23 MED ORDER — SODIUM CHLORIDE 0.9 % IV SOLN
10.0000 mg | INTRAVENOUS | Status: DC | PRN
  Administered 2011-06-23: 5 ug/min via INTRAVENOUS

## 2011-06-23 MED ORDER — MIDAZOLAM HCL 5 MG/5ML IJ SOLN
INTRAMUSCULAR | Status: DC | PRN
  Administered 2011-06-23: 2 mg via INTRAVENOUS

## 2011-06-23 MED ORDER — ONDANSETRON HCL 4 MG/2ML IJ SOLN
INTRAMUSCULAR | Status: DC | PRN
  Administered 2011-06-23: 4 mg via INTRAVENOUS

## 2011-06-23 MED ORDER — HYDROMORPHONE HCL PF 1 MG/ML IJ SOLN
0.2500 mg | INTRAMUSCULAR | Status: DC | PRN
  Administered 2011-06-23 (×3): 0.25 mg via INTRAVENOUS

## 2011-06-23 MED ORDER — SODIUM CHLORIDE 0.9 % IR SOLN
Status: DC | PRN
  Administered 2011-06-23: 08:00:00

## 2011-06-23 MED ORDER — ACETAMINOPHEN 325 MG PO TABS: 650.0000 mg | ORAL_TABLET | ORAL | Status: DC | PRN

## 2011-06-23 MED ORDER — NEOSTIGMINE METHYLSULFATE 1 MG/ML IJ SOLN
INTRAMUSCULAR | Status: DC | PRN
  Administered 2011-06-23: 5 mg via INTRAVENOUS

## 2011-06-23 MED ORDER — ROCURONIUM BROMIDE 100 MG/10ML IV SOLN
INTRAVENOUS | Status: DC | PRN
  Administered 2011-06-23: 50 mg via INTRAVENOUS

## 2011-06-23 MED ORDER — METHYLDOPA 250 MG PO TABS
250.0000 mg | ORAL_TABLET | Freq: Two times a day (BID) | ORAL | Status: DC
  Filled 2011-06-23 (×6): qty 1

## 2011-06-23 MED ORDER — LIDOCAINE HCL (CARDIAC) 20 MG/ML IV SOLN
INTRAVENOUS | Status: DC | PRN
  Administered 2011-06-23: 100 mg via INTRAVENOUS

## 2011-06-23 MED ORDER — PHENYLEPHRINE HCL 10 MG/ML IJ SOLN
INTRAMUSCULAR | Status: DC | PRN
  Administered 2011-06-23 (×4): 40 ug via INTRAVENOUS
  Administered 2011-06-23: 120 ug via INTRAVENOUS
  Administered 2011-06-23 (×2): 40 ug via INTRAVENOUS
  Administered 2011-06-23: 80 ug via INTRAVENOUS
  Administered 2011-06-23 (×5): 40 ug via INTRAVENOUS

## 2011-06-23 MED ORDER — SODIUM CHLORIDE 0.9 % IV SOLN: 250.0000 mL | INTRAVENOUS | Status: DC

## 2011-06-23 MED ORDER — VECURONIUM BROMIDE 10 MG IV SOLR
INTRAVENOUS | Status: DC | PRN
  Administered 2011-06-23 (×2): 1 mg via INTRAVENOUS
  Administered 2011-06-23: 2 mg via INTRAVENOUS
  Administered 2011-06-23 (×4): 1 mg via INTRAVENOUS
  Administered 2011-06-23: 2 mg via INTRAVENOUS
  Administered 2011-06-23: 1 mg via INTRAVENOUS

## 2011-06-23 MED ORDER — EPHEDRINE SULFATE 50 MG/ML IJ SOLN
INTRAMUSCULAR | Status: DC | PRN
  Administered 2011-06-23: 10 mg via INTRAVENOUS
  Administered 2011-06-23: 5 mg via INTRAVENOUS

## 2011-06-23 MED ORDER — MECLIZINE HCL 25 MG PO TABS
25.0000 mg | ORAL_TABLET | Freq: Three times a day (TID) | ORAL | Status: DC | PRN
  Filled 2011-06-23: qty 1

## 2011-06-23 MED ORDER — HETASTARCH-ELECTROLYTES 6 % IV SOLN
INTRAVENOUS | Status: DC | PRN
  Administered 2011-06-23: 10:00:00 via INTRAVENOUS

## 2011-06-23 MED ORDER — BUPIVACAINE HCL (PF) 0.5 % IJ SOLN
INTRAMUSCULAR | Status: DC | PRN
  Administered 2011-06-23: 5 mL

## 2011-06-23 MED ORDER — DIAZEPAM 5 MG PO TABS: 5.0000 mg | ORAL_TABLET | Freq: Four times a day (QID) | ORAL | Status: DC | PRN

## 2011-06-23 MED ORDER — SODIUM CHLORIDE 0.9 % IV SOLN
INTRAVENOUS | Status: DC
  Administered 2011-06-23 – 2011-06-27 (×5): via INTRAVENOUS

## 2011-06-23 MED ORDER — SODIUM CHLORIDE 0.9 % IR SOLN
Status: DC | PRN
  Administered 2011-06-23: 10:00:00

## 2011-06-23 MED ORDER — GLYCOPYRROLATE 0.2 MG/ML IJ SOLN
INTRAMUSCULAR | Status: DC | PRN
  Administered 2011-06-23: 0.6 mg via INTRAVENOUS
  Administered 2011-06-23 (×2): 0.1 mg via INTRAVENOUS

## 2011-06-23 MED ORDER — VANCOMYCIN HCL IN DEXTROSE 1-5 GM/200ML-% IV SOLN
1000.0000 mg | INTRAVENOUS | Status: AC
  Administered 2011-06-23: 1000 mg via INTRAVENOUS

## 2011-06-23 MED ORDER — MORPHINE SULFATE 4 MG/ML IJ SOLN
1.0000 mg | INTRAMUSCULAR | Status: DC | PRN
  Administered 2011-06-23 – 2011-06-24 (×3): 2 mg via INTRAVENOUS
  Filled 2011-06-23 (×3): qty 1

## 2011-06-23 MED ORDER — SODIUM CHLORIDE 0.9 % IV SOLN
INTRAVENOUS | Status: DC | PRN
  Administered 2011-06-23 (×2): via INTRAVENOUS

## 2011-06-23 MED ORDER — 0.9 % SODIUM CHLORIDE (POUR BTL) OPTIME
TOPICAL | Status: DC | PRN
  Administered 2011-06-23: 1000 mL

## 2011-06-23 MED ORDER — ACETAMINOPHEN 650 MG RE SUPP: 650.0000 mg | RECTAL | Status: DC | PRN

## 2011-06-23 MED ORDER — LIDOCAINE-EPINEPHRINE 1 %-1:100000 IJ SOLN
INTRAMUSCULAR | Status: DC | PRN
  Administered 2011-06-23: 5 mL

## 2011-06-23 MED ORDER — LEVOTHYROXINE SODIUM 75 MCG PO TABS
75.0000 ug | ORAL_TABLET | Freq: Every day | ORAL | Status: DC
  Filled 2011-06-23: qty 1

## 2011-06-23 MED ORDER — SODIUM CHLORIDE 0.9 % IJ SOLN: 3.0000 mL | INTRAMUSCULAR | Status: DC | PRN

## 2011-06-23 SURGICAL SUPPLY — 111 items
1 TP-1 PDS PLUS ×2 IMPLANT
2-0 CT1 VICRYL ×1 IMPLANT
24 MEDIUM TITANIUM LIGATION CLIPS ×1 IMPLANT
24 SMALL  WIDE TITANIUM LIGATIN CLIPS ×1 IMPLANT
ADH SKN CLS APL DERMABOND .7 (GAUZE/BANDAGES/DRESSINGS) ×3
APPLIER CLIP 11 MED OPEN (CLIP) ×4
APR CLP MED 11 20 MLT OPN (CLIP) ×3
BAG DECANTER FOR FLEXI CONT (MISCELLANEOUS) ×4 IMPLANT
BLADE SURG 11 STRL SS (BLADE) ×1 IMPLANT
BLADE SURG ROTATE 9660 (MISCELLANEOUS) IMPLANT
BUR MATCHSTICK NEURO 3.0 LAGG (BURR) ×4 IMPLANT
CAGE 9MM (Cage) ×2 IMPLANT
CANISTER SUCTION 2500CC (MISCELLANEOUS) ×4 IMPLANT
CHEST/BREAST DRAPE ×1 IMPLANT
CLIP APPLIE 11 MED OPEN (CLIP) ×3 IMPLANT
CLOTH BEACON ORANGE TIMEOUT ST (SAFETY) ×7 IMPLANT
CONT SPEC 4OZ CLIKSEAL STRL BL (MISCELLANEOUS) ×8 IMPLANT
CORDS BIPOLAR (ELECTRODE) ×1 IMPLANT
COVADERM ×2 IMPLANT
COVER BACK TABLE 24X17X13 BIG (DRAPES) IMPLANT
COVER TABLE BACK 60X90 (DRAPES) ×7 IMPLANT
DECANTER SPIKE VIAL GLASS SM (MISCELLANEOUS) ×4 IMPLANT
DERMABOND ADVANCED (GAUZE/BANDAGES/DRESSINGS) ×1
DERMABOND ADVANCED .7 DNX12 (GAUZE/BANDAGES/DRESSINGS) ×6 IMPLANT
DRAPE C-ARM 42X72 X-RAY (DRAPES) ×11 IMPLANT
DRAPE INCISE IOBAN 66X45 STRL (DRAPES) ×6 IMPLANT
DRAPE LAPAROTOMY 100X72X124 (DRAPES) ×5 IMPLANT
DRAPE POUCH INSTRU U-SHP 10X18 (DRAPES) ×4 IMPLANT
DRAPE PROXIMA HALF (DRAPES) IMPLANT
DURAPREP 26ML APPLICATOR (WOUND CARE) ×6 IMPLANT
ELECT BLADE 4.0 EZ CLEAN MEGAD (MISCELLANEOUS) ×8
ELECT CAUTERY BLADE 6.4 (BLADE) ×1 IMPLANT
ELECT REM PT RETURN 9FT ADLT (ELECTROSURGICAL) ×12
ELECTRODE BLDE 4.0 EZ CLN MEGD (MISCELLANEOUS) IMPLANT
ELECTRODE REM PT RTRN 9FT ADLT (ELECTROSURGICAL) ×3 IMPLANT
GAUZE SPONGE 4X4 16PLY XRAY LF (GAUZE/BANDAGES/DRESSINGS) ×2 IMPLANT
GLOVE BIO SURGEON STRL SZ 6.5 (GLOVE) ×1 IMPLANT
GLOVE BIO SURGEON STRL SZ7 (GLOVE) ×3 IMPLANT
GLOVE BIO SURGEON STRL SZ8 (GLOVE) ×2 IMPLANT
GLOVE BIOGEL PI IND STRL 6.5 (GLOVE) IMPLANT
GLOVE BIOGEL PI IND STRL 7.0 (GLOVE) IMPLANT
GLOVE BIOGEL PI IND STRL 7.5 (GLOVE) ×3 IMPLANT
GLOVE BIOGEL PI IND STRL 8 (GLOVE) IMPLANT
GLOVE BIOGEL PI IND STRL 8.5 (GLOVE) ×6 IMPLANT
GLOVE BIOGEL PI INDICATOR 6.5 (GLOVE) ×1
GLOVE BIOGEL PI INDICATOR 7.0 (GLOVE) ×1
GLOVE BIOGEL PI INDICATOR 7.5 (GLOVE) ×3
GLOVE BIOGEL PI INDICATOR 8 (GLOVE) ×1
GLOVE BIOGEL PI INDICATOR 8.5 (GLOVE) ×5
GLOVE ECLIPSE 7.5 STRL STRAW (GLOVE) ×9 IMPLANT
GLOVE ECLIPSE 8.5 STRL (GLOVE) ×9 IMPLANT
GLOVE EXAM NITRILE LRG STRL (GLOVE) IMPLANT
GLOVE EXAM NITRILE MD LF STRL (GLOVE) IMPLANT
GLOVE EXAM NITRILE XL STR (GLOVE) IMPLANT
GLOVE EXAM NITRILE XS STR PU (GLOVE) IMPLANT
GLOVE INDICATOR 6.5 STRL GRN (GLOVE) ×1 IMPLANT
GLOVE INDICATOR 8.5 STRL (GLOVE) ×2 IMPLANT
GOWN BRE IMP SLV AUR LG STRL (GOWN DISPOSABLE) ×1 IMPLANT
GOWN BRE IMP SLV AUR XL STRL (GOWN DISPOSABLE) ×4 IMPLANT
GOWN PREVENTION PLUS XXLARGE (GOWN DISPOSABLE) ×1 IMPLANT
GOWN STRL NON-REIN LRG LVL3 (GOWN DISPOSABLE) ×4 IMPLANT
GOWN STRL REIN 2XL LVL4 (GOWN DISPOSABLE) ×11 IMPLANT
INSERT FOGARTY 61MM (MISCELLANEOUS) IMPLANT
INSERT FOGARTY SM (MISCELLANEOUS) IMPLANT
KIT BASIN OR (CUSTOM PROCEDURE TRAY) ×5 IMPLANT
KIT ROOM TURNOVER OR (KITS) ×7 IMPLANT
LOOP VESSEL MAXI BLUE (MISCELLANEOUS) ×3 IMPLANT
LOOP VESSEL MINI RED (MISCELLANEOUS) ×3 IMPLANT
NDL SPNL 18GX3.5 QUINCKE PK (NEEDLE) IMPLANT
NEEDLE HYPO 22GX1.5 SAFETY (NEEDLE) ×4 IMPLANT
NEEDLE SPNL 18GX3.5 QUINCKE PK (NEEDLE) ×4 IMPLANT
NS IRRIG 1000ML POUR BTL (IV SOLUTION) ×5 IMPLANT
PACK CHEST (CUSTOM PROCEDURE TRAY) ×1 IMPLANT
PACK FOAM VITOSS 10CC (Orthopedic Implant) ×1 IMPLANT
PACK LAMINECTOMY NEURO (CUSTOM PROCEDURE TRAY) ×4 IMPLANT
PAD ARMBOARD 7.5X6 YLW CONV (MISCELLANEOUS) ×21 IMPLANT
PAD SHARPS MAGNETIC DISPOSAL (MISCELLANEOUS) ×1 IMPLANT
PATTIES SURGICAL .5 X1 (DISPOSABLE) ×4 IMPLANT
PATTIES SURGICAL 1X1 (DISPOSABLE) ×1 IMPLANT
PENCIL BUTTON BLDE SNGL 10FT (ELECTRODE) ×1 IMPLANT
ROD 45.5X40CM (Rod) ×1 IMPLANT
ROD TI 5.5MM 5CM (Rod) ×1 IMPLANT
SCREW POLYAX 6.5X45MM (Screw) ×4 IMPLANT
SCREW SET SPINAL STD HEXALOBE (Screw) ×4 IMPLANT
SPONGE GAUZE 4X4 12PLY (GAUZE/BANDAGES/DRESSINGS) ×4 IMPLANT
SPONGE INTESTINAL PEANUT (DISPOSABLE) ×8 IMPLANT
SPONGE LAP 18X18 X RAY DECT (DISPOSABLE) IMPLANT
SPONGE LAP 4X18 X RAY DECT (DISPOSABLE) IMPLANT
SPONGE SURGIFOAM ABS GEL 100 (HEMOSTASIS) ×4 IMPLANT
STAPLER VISISTAT 35W (STAPLE) ×2 IMPLANT
SUT 1 PDS PLUS ×2 IMPLANT
SUT 3-0 PROLENE SH 1 ×2 IMPLANT
SUT ETHILON 3 0 FSL (SUTURE) ×1 IMPLANT
SUT PROLENE 4 0 RB 1 (SUTURE) ×20
SUT PROLENE 4-0 RB1 .5 CRCL 36 (SUTURE) ×12 IMPLANT
SUT SILK 0 TIES 10X30 (SUTURE) ×4 IMPLANT
SUT SILK 2 0 SH CR/8 (SUTURE) ×1 IMPLANT
SUT SILK 2 0 TIES 10X30 (SUTURE) ×4 IMPLANT
SUT SILK 3 0 TIES 10X30 (SUTURE) ×4 IMPLANT
SUT VIC AB 0 CT1 27 (SUTURE) ×4
SUT VIC AB 0 CT1 27XBRD ANBCTR (SUTURE) ×3 IMPLANT
SUT VIC AB 1 CT1 18XBRD ANBCTR (SUTURE) ×3 IMPLANT
SUT VIC AB 1 CT1 8-18 (SUTURE) ×16
SUT VIC AB 2-0 CP2 18 (SUTURE) ×4 IMPLANT
SUT VIC AB 3-0 SH 8-18 (SUTURE) ×5 IMPLANT
SYR 20ML ECCENTRIC (SYRINGE) ×4 IMPLANT
TOWEL OR 17X24 6PK STRL BLUE (TOWEL DISPOSABLE) ×8 IMPLANT
TOWEL OR 17X26 10 PK STRL BLUE (TOWEL DISPOSABLE) ×8 IMPLANT
TRAP SPECIMEN MUCOUS 40CC (MISCELLANEOUS) ×4 IMPLANT
TRAY FOLEY CATH 14FRSI W/METER (CATHETERS) ×4 IMPLANT
WATER STERILE IRR 1000ML POUR (IV SOLUTION) ×4 IMPLANT

## 2011-06-23 NOTE — OR Nursing (Signed)
Radiologist Dr Fredirick Lathe confirmed no instrumentation in back at completion of Lumbar two-three fusion

## 2011-06-23 NOTE — OR Nursing (Signed)
Radiologist Dr Fredirick Lathe confirmed no instrumentation in abdomen at completion of surgery by Dr Imogene Burn and Dr Edilia Bo

## 2011-06-23 NOTE — Anesthesia Preprocedure Evaluation (Addendum)
Anesthesia Evaluation  Patient identified by MRN, date of birth, ID band Patient awake    Reviewed: Allergy & Precautions, H&P , NPO status , Patient's Chart, lab work & pertinent test results, reviewed documented beta blocker date and time   Airway Mallampati: II TM Distance: <3 FB Neck ROM: Full    Dental  (+) Edentulous Upper and Dental Advisory Given   Pulmonary  breath sounds clear to auscultation  Pulmonary exam normal       Cardiovascular hypertension, Pt. on medications + CAD, + Past MI, + Cardiac Stents and +CHF Rhythm:Regular Rate:Normal     Neuro/Psych CVA, No Residual Symptoms    GI/Hepatic negative GI ROS, Neg liver ROS,   Endo/Other  Hypothyroidism   Renal/GU negative Renal ROS     Musculoskeletal   Abdominal   Peds  Hematology   Anesthesia Other Findings   Reproductive/Obstetrics                          Anesthesia Physical Anesthesia Plan  ASA: III  Anesthesia Plan: General   Post-op Pain Management:    Induction: Intravenous  Airway Management Planned: Oral ETT  Additional Equipment:   Intra-op Plan:   Post-operative Plan: Extubation in OR  Informed Consent: I have reviewed the patients History and Physical, chart, labs and discussed the procedure including the risks, benefits and alternatives for the proposed anesthesia with the patient or authorized representative who has indicated his/her understanding and acceptance.   Dental advisory given  Plan Discussed with: CRNA, Anesthesiologist and Surgeon  Anesthesia Plan Comments:         Anesthesia Quick Evaluation

## 2011-06-23 NOTE — Consult Note (Signed)
Name: Jean [name] MRN: [id number] DOB: 05-Apr-1928  LOS: 0  CRITICAL CARE ADMISSION NOTE  History of Present Illness: This is a pleasant 78 y/o female with HTN, CAD, CKD, Hypothyroidism was admitted on 06/23/2011 for an elective L2-L3 lumbar surgery for kyphoscoliosis and stenosis of L2-L3.  She had an extensive procedure which was complicated by aortic injury requiring laparotomy and repair by Vascular surgery.  During the procedure she lost an estimated 1800cc of blood and received 3400cc of LR, 1000cc NS, 500cc hetastarch, 600cc of Cell Saver, and 2 U PRBC.  The procedure started around 0800 and ended around 1500.  She was extubated in the OR and transferred to the PACU.  Her UOP has been low in the PACU.  Lines / Drains: peripheral  Cultures / Sepsis markers: none  Antibiotics: Vanc 4/22 (perioperative vascular) x1  Tests / Events: 4/22 (Dr. Danielle Dess) Bilateral laminotomies and foraminotomies decompression L2-L3 with decompression of L2 and L3 nerve roots greater dissection then for simple interbody arthrodesis. Posterior interbody arthrodesis using peek spacers with autograft and allograft. Removal of previously placed hardware L3-L5, pedicle screw fixation L2-L3 with posterior lateral arthrodesis using allograft and autograft 4/22 (Dr. Imogene Burn) Exploratory celiotomy and repair of aorta     Past Medical History  Diagnosis Date  . Hyperlipidemia   . Thyroid disease     hypo  . CHF (congestive heart failure)   . Mitral regurgitation   . Tricuspid regurgitation   . Anemia   . UTI (lower urinary tract infection)   . Arthritis     Osteoarthtistis  . Elevated glucose   . CVA (cerebral infarction)   . Vitamin d deficiency   . Hypothyroidism   . Myocardial infarction   . Chronic kidney failure     stage 3  . CAD (coronary artery disease)     sees Dr. Ricka Burdock last 03/2011  . Hypertension     Dr. Eden Emms cardiologist, Dr. Cheron Schaumann primary doctor   Past Surgical History    Procedure Date  . Abdominal hysterectomy   . Back surgery   . Coronary stent placement    Prior to Admission medications   Medication Sig Start Date End Date Taking? Authorizing Provider  aspirin 325 MG tablet Take 325 mg by mouth daily.    Yes Historical Provider, MD  cholecalciferol (VITAMIN D) 1000 UNITS tablet Take 1,000 Units by mouth daily.   Yes Tresa Garter, MD  clopidogrel (PLAVIX) 75 MG tablet Take 75 mg by mouth daily. 08/19/10  Yes Georgina Quint Plotnikov, MD  Coenzyme Q10 (CO Q 10 PO) Take 1 tablet by mouth daily.    Yes Historical Provider, MD  levothyroxine (SYNTHROID, LEVOTHROID) 75 MCG tablet Take 75 mcg by mouth daily.    Yes Historical Provider, MD  meclizine (ANTIVERT) 25 MG tablet Take 25 mg by mouth 3 (three) times daily as needed. dizziness   Yes Historical Provider, MD  methyldopa (ALDOMET) 250 MG tablet Take 250 mg by mouth 2 (two) times daily.   Yes Historical Provider, MD  metoprolol (LOPRESSOR) 100 MG tablet Take 150 mg by mouth 2 (two) times daily. 04/11/11  Yes Georgina Quint Plotnikov, MD  nitroGLYCERIN (NITROSTAT) 0.4 MG SL tablet Place 0.4 mg under the tongue every 5 (five) minutes as needed. For chest pain   Yes Historical Provider, MD  pravastatin (PRAVACHOL) 20 MG tablet Take 20 mg by mouth 2 (two) times daily. 08/19/10  Yes Tresa Garter, MD   Allergies  Allergen Reactions  .  Ace Inhibitors   . Ezetimibe-Simvastatin     REACTION: Myalgia  . Rosuvastatin     REACTION: aches  . Penicillins Rash   Family History  Problem Relation Age of Onset  . Aneurysm Mother   . Heart disease Mother   . Heart attack Father   . Hypertension Father   . Anesthesia problems Neg Hx    Social History  reports that she has never smoked. She does not have any smokeless tobacco history on file. She reports that she does not drink alcohol or use illicit drugs.  Review Of Systems   Cannot obtain due to somnolence in PACU  Vital Signs:   Filed Vitals:   06/23/11  1945  BP:   Pulse: 54  Temp:   Resp: 11    Physical Examination: Gen: lying comfortably in bed in PACU HEENT: NCAT, PERRL, EOMi, OP clear,  Neck: supple without masses PULM: CTA B CV: RRR, slight systolic murmur, no JVD AB: BS+, midline laparotomy scar clean, dry and intact, soft, no guarding or rebound Ext: warm, no edema, no clubbing, no cyanosis; Foot pulses: 2+ DP on R, 1+ DP on L Derm: no rash or skin breakdown Neuro: Asleep but arouses, answers simple questions and follows commands, able to wiggle feet bilaterally Psyche: cannot assess  Labs and Imaging:   Per OR documentation h/h at 1353 on 4/22 was 10.9/32 (up from Hgb 7.8 earlier in the case)  4/19 Cr 1.36   Assessment and Plan: This is an 78 y/o female with kyphoscoliosis and lumbar stenosis who underwent and extensive lumbar surgery which was complicated by aortic injury requiring and emergent laparotomy and aortic repair on 4/22.  She has since been discharge to the PACU and has normal vitals but her UOP has been low.  PCCM is consulted for further management.  Status post lumbar surgery (06/23/2011)   Assessment: currently appears to be doing well from neurologic standpoint   Plan:  -post op care per neurosurgery  Aortic injury, S/P vascular surgery (06/23/2011)   Assessment: had significant bleeding intraoperatively; hemodyncamically stable with evidence of good circulation in lower extremities post operatively   Plan:  -check cbc, coags, and serial h/h -Goal Hgb >7 -remainder of care per vascular  ARF (acute renal failure) (06/23/2011)   Assessment: oliguric in PACU; has baseline CKD; volume status appears OK but received aggressive resuscitation in OR   Plan:  -bolus NS ordered now -check CXR now to ensure no pulmonary edema  HYPOTHYROIDISM, UNSPECIFIED (12/23/2006)   Assessment:    Plan:  -restart home meds 4/23 if able to take PO  HYPERTENSION (12/28/2006)   Assessment: currently HD stable in PACU    Plan:  -will likely need to restart home lopressor 4/23  CORONARY ATHEROSCLEROSIS NATIVE CORONARY ARTERY (11/15/2009)   Assessment: no chest pain or other subjective evidence of ischaemia in PACU   Plan:  -check 12 lead EKG -one set of cardiac enzymes now   Best practices / Disposition: NSGY primary service, PCCM and Vascular consulting  Feeding/protein malnutrition: npo Analgesia: pain meds per NSGY Sedation: n/a Thromboprophylaxis: scd HOB >30 degrees Ulcer prophylaxis: n/a Glucose control/hyperglycemia: monitor cbg  The patient is critically ill with multiple organ systems failure and requires high complexity decision making for assessment and support, frequent evaluation and titration of therapies, application of advanced monitoring technologies and extensive interpretation of multiple databases. Critical Care Time devoted to patient care services described in this note is 45 minutes.  Heber Correll, M.D. Pulmonary  and Critical Care Medicine [facility] Pager: [phone]  06/23/2011, 7:52 PM

## 2011-06-23 NOTE — Anesthesia Postprocedure Evaluation (Signed)
Anesthesia Post Note  Patient: Jean [name]  Procedure(s) Performed: Procedure(s) (LRB): POSTERIOR LUMBAR FUSION 1 WITH HARDWARE REMOVAL (N/A) EXPLORATORY LAPAROTOMY (Bilateral) ABDOMINAL EXPOSURE (N/A)  Anesthesia type: general  Patient location: PACU  Post pain: Pain level controlled  Post assessment: Patient's Cardiovascular Status Stable  Last Vitals:  Filed Vitals:   06/23/11 1721  BP: 148/58  Pulse: 47  Temp:   Resp: 13    Post vital signs: Reviewed and stable  Level of consciousness: sedated  Complications: No apparent anesthesia complications

## 2011-06-23 NOTE — OR Nursing (Signed)
Procedure part two started at 1305 time out done

## 2011-06-23 NOTE — OR Nursing (Signed)
Significant bleeding from lumbar wound sighted by Dr. Danielle Dess, he requested a Vascular consult. The patient's lumbar wound was sutured and a dressing applied. Patient was then flipped to supine position. Under Dr Nicky Pugh supervision the patient was positioned and abdomen prepped. Dr Edilia Bo also arrived to assist and the procedure began at 1010.

## 2011-06-23 NOTE — Op Note (Addendum)
OPERATIVE NOTE   PROCEDURE: 1. Exploratory celiotomy 2. Repair of aorta  PRE-OPERATIVE DIAGNOSIS: Acute artery bleeding during posterior fusion  POST-OPERATIVE DIAGNOSIS: same as above   SURGEON: Leonides Sake, MD  ASSISTANT(S): Cari Caraway, MD  ANESTHESIA: general  ESTIMATED BLOOD LOSS: see Dr. Verlee Rossetti note  FINDING(S): 1. Avulsed lumbar artery  SPECIMEN(S):  none  INDICATIONS:   Jean [name] is a 77 y.o. female who presents with undergoing L2-3 posterior fusion when acute arterial bleeding developed during the case.  Dr. Danielle Dess felt a significant injury had occurred and requested intra-operative consultation.  Based on the history, either a lumbar artery injury or aortic injury had occurred.  Given the severity of injury, an exploratory laparotomy with repair of the aorta was indicated.    DESCRIPTION: No consent was obtained as this was an emergent procedure.  The patient was already in the operating room and in the supine position upon the operating table.  The patient had already been intubated and was maintained under general anesthesia, the patient was prepped and draped in the standard fashion for: an exploratory celiotomy and aortic repair.  I made an incision from xiphoid down to suprapubic with a 10 blade and opened the subcutaneous tissue down to the fascia with electrocautery.  I sharply opened into the fascia and underlying peritoneum under direct visualization.  The rest of the fascia was opened under direct visualization.  I placed a Balfour retractor and then eviscerated the omentum and ran the bowels.  There was no obvious pathology found.  I packed away the transverse colon and small bowel with wet towels.  I placed the Omni retractor to obtain exposure.  I took down attachments to the duodenum to mobilize it fully up to the ligament of Treitz.  I repacked away the small bowel.  Using electrocautery, I opened the retroperitoneum until I had exposure of the aorta.  I  carried this dissection cephalad, encountering the inferior mesenteric vein in the process.  This was ligated with 2-0 silk and transected, as more cephalad exposure was needed.  Eventually I came across what was likely an accessory left renal artery and likely right inferior polar artery.  I was eventually able to identify the left renal vein.  Based on the previous MRI studies, this represented the cephalad extent of necessary dissection, as the L2-3 space was inferior to this level.  I extended the retroperitoneal dissection and dissected out the periaortic space.  In this process, bleeding from the posterior aspect of the aorta at the level of interspace disc dissection was identified.  I ligated and transected a right side lumbar artery to facilitate mobilization.  The bleeding appeared to be from an avulsed lumbar artery on the left side.  I had to briefly apply an aortic clamp in the infrarenal position but superior to the right inferior polar artery to better visualized the injury.  As the clamp time was no more than a few minutes at a time, I did not give any anticoagulation.  The avulsed ostium was sutured ligated with several felt pledgeted 4-0 Prolene stitched.  Eventually, all bleeding from the aorta was controlled.  At Dr. Verlee Rossetti request, I placed several large thombin soaked gelfoam square placed between the aorta and the interbody disc space, as Dr. Danielle Dess will be completing his posterior fusion at the end of this portion of the case.  Hopefully, this will provide some barrier between the bone graft and the aorta.  At this point, the abdomen was  washed out and no further active bleeding was noted.  The retroperitoneum was repaired with a running stitch of of 2-0 Vicryl.  The bowels were replaced in the normal anatomic positions.  The omentum was redraped over the bowels.  The nasogastric tube was in the appropriate position.  The fascia was then repaired with two running double stranded PDS  stitches, running from above and below and tying in the middle.  The skin was cleaned, dried, and covered with a sterile coverlet.  COMPLICATIONS: none  CONDITION: stable  Leonides Sake, MD [facility] Office: [phone] Pager: [phone]  06/23/2011, 12:55 PM

## 2011-06-23 NOTE — H&P (Addendum)
Jean [name] is an 78 y.o. female.   Chief Complaint: Back pain, left lower extremity pain HPI: Jean [name]  #[id number]  DOB:  1928/08/24   [name] returns to the office today having had a new MRI of her lumbar spine.  I had the opportunity to review the study and it demonstrates that, indeed, as suspected, she has substantial junctional syndrome at the level of L2-L3 above her L3 to L5 fusion.  She has a rotatory scoliosis at this level that causes left lateral recess stenosis that is quite severe.  This would correspond well with her left lumbar radicular pain.  I indicated to [facility] that ultimately to decompress and stabilize this would require laminectomy and decompression of the L2-L3 joint, placement of spacers to reduce the scoliosis as best possible and placement of pedicle screws at L2 and L3 to stabilize the spine in that position.  We would remove the old hardware.  This is, indeed, a substantial undertaking and I believe that an open procedure would be a better option as opposed to doing a minimally invasive anterolateral exposure followed by percutaneous pedicle screws.  The last surgery that she had in that regard was about a 9-hour procedure and certainly we would want to minimize exposure on the operating table.  Since her last surgery and in fact this past summer, Ms. [name] has had coronary artery stenting and she is on Clopidogrel, in addition to Aspirin.  We would like to have input from Dr. Eden Emms as to the ability to safely remove her from each of these medications during the perioperative period.  I would certainly want his cardiac clearance advice prior to proceeding with surgical intervention.  In any event, we can schedule this at the earliest convenience.  We would do a one level decompression and fusion at L2-3 with removal of the previously placed hardware from L3 to L5.          Stefani Dama, M.D./sv NEUROSURGICAL CONSULTATION  Kailyn B. Carie  #[id number]  DOB:  06/17/1928               CHIEF COMPLAINT:   Back pain, left lower extremity pain.    HISTORY OF PRESENT ILLNESS:  Jean [name] is an 78 year old, right-handed individual whom I treated back in 2005.  At that time she had a minimally invasive posterior lumbar decompression and arthrodesis L3 to L5.  She had significant back and leg pain and did reasonably well until the last year or so.  She tells me that in the last year she had a heart attack.  That was quite serious, but she has had back pain that has been evolving worst centrally and now radiating into the left buttock and left lower extremity.  She notes that the pain is quite severe and unrelenting.  She feels something definitive needs to be done about it.  She has not had any new radiographs and today in the office to further her workup I obtained a new AP and lateral lumbar spine radiograph.  This demonstrates that the interbody graft at L3-4 and L4-5 with posteriorly placed hardware appears well fused.  She does, however, on the AP view demonstrate that she has a junctional degenerative process with a focal scoliosis apex to the right side at the L2-L3 level.  The left side at L2-L3 appears completely collapsed accentuating the scoliotic curvature.  I demonstrated the findings to her and I noted that [name] ultimately will require an MRI of  the lumbar spine to better define the anatomy in this region.    PAST MEDICAL HISTORY:  She notes some hypertension.  She has had a history of a heart attack as noted.    DRUG ALLERGIES:   SHE NOTES AN ALLERGY TO PENICILLIN.    PERSONAL HISTORY:   She doesn't smoke or drink alcohol.  Her height and weight have been stable at 5', 7 ".  She is followed by Dr. Posey Rea.    REVIEW OF SYSTEMS:   Systems review noted on a 14-point review sheet is no different for any major items other than her History of Present Illness.    MEDICATIONS:    Current medications include Aspirin, Plavix, Coenzyme Q10, Synthroid 75 mcg, Meclizine 25  mg, Methyldopa, Metoprolol, Nitroglycerin and Pravachol 20 mg.      DRUG ALLERGIES:   SHE NOTES ALLERGIES TO SIMVASTATIN, PENICILLINS AND ROSUVASTATIN.    PHYSICAL EXAMINATION:  She reveals that she has good strength in the tibialis anterior and the gastrocs.  She moves very cautious and slowly in regards to her back and her motor strength does appear good in the major proximal muscle groups.    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  I have advised that she should undergo a new MRI of the lumbar spine.  I discussed with her consideration of epidural steroid injections and Angellynn tells me that prior to her previous surgery, she had a series of epidural steroid injections that gave her no help at all and she is not particularly interested in pursuing this avenue of therapy.  I would like to see her back after the MRI is completed.  We will try to expedite this in the near future.    [facility]    Stefani Dama, M.D.  Past Medical History  Diagnosis Date  . Hyperlipidemia   . Thyroid disease     hypo  . CHF (congestive heart failure)   . Mitral regurgitation   . Tricuspid regurgitation   . Anemia   . UTI (lower urinary tract infection)   . Arthritis     Osteoarthtistis  . Elevated glucose   . CVA (cerebral infarction)   . Vitamin d deficiency   . Hypothyroidism   . Myocardial infarction   . Chronic kidney failure     stage 3  . CAD (coronary artery disease)     sees Dr. Ricka Burdock last 03/2011  . Hypertension     Dr. Eden Emms cardiologist, Dr. Cheron Schaumann primary doctor    Past Surgical History  Procedure Date  . Abdominal hysterectomy   . Back surgery   . Coronary stent placement     Family History  Problem Relation Age of Onset  . Aneurysm Mother   . Heart disease Mother   . Heart attack Father   . Hypertension  Father   . Anesthesia problems Neg Hx    Social History:  reports that she has never smoked. She does not have any smokeless tobacco history on file. She reports that she does not drink alcohol or use illicit drugs.  Allergies:  Allergies  Allergen Reactions  . Ace Inhibitors   . Ezetimibe-Simvastatin     REACTION: Myalgia  . Rosuvastatin     REACTION: aches  . Penicillins Rash    Medications Prior to Admission  Medication Dose Route Frequency Provider Last  Rate Last Dose  . vancomycin (VANCOCIN) IVPB 1000 mg/200 mL premix  1,000 mg Intravenous 120 min pre-op Barnett Abu, MD       Medications Prior to Admission  Medication Sig Dispense Refill  . aspirin 325 MG tablet Take 325 mg by mouth daily.       . clopidogrel (PLAVIX) 75 MG tablet Take 75 mg by mouth daily.      . Coenzyme Q10 (CO Q 10 PO) Take 1 tablet by mouth daily.       Marland Kitchen levothyroxine (SYNTHROID, LEVOTHROID) 75 MCG tablet Take 75 mcg by mouth daily.       . meclizine (ANTIVERT) 25 MG tablet Take 25 mg by mouth 3 (three) times daily as needed. dizziness      . metoprolol (LOPRESSOR) 100 MG tablet Take 150 mg by mouth 2 (two) times daily.      . nitroGLYCERIN (NITROSTAT) 0.4 MG SL tablet Place 0.4 mg under the tongue every 5 (five) minutes as needed. For chest pain      . pravastatin (PRAVACHOL) 20 MG tablet Take 20 mg by mouth 2 (two) times daily.        No results found for this or any previous visit (from the past 48 hour(s)). No results found.  Review of Systems  HENT: Negative.   Eyes: Negative.   Respiratory: Negative.   Cardiovascular: Negative.   Gastrointestinal: Negative.   Genitourinary: Negative.   Musculoskeletal: Positive for back pain.  Neurological: Positive for focal weakness and weakness.  Endo/Heme/Allergies: Negative.   Psychiatric/Behavioral: Negative.     There were no vitals taken for this visit. Physical Exam  Constitutional: She is oriented to person, place, and time. She appears  well-developed and well-nourished.  HENT:  Head: Normocephalic and atraumatic.  Eyes: Conjunctivae and EOM are normal. Pupils are equal, round, and reactive to light.  Neck: Normal range of motion. Neck supple. No tracheal deviation present. No thyromegaly present.  Cardiovascular: Normal rate and regular rhythm.   Respiratory: Effort normal and breath sounds normal.  GI: Soft. Bowel sounds are normal.  Musculoskeletal: Normal range of motion. She exhibits tenderness.       Tender in mid lumbar region and over SI joints  Neurological: She is alert and oriented to person, place, and time. She has normal reflexes.  Skin: Skin is warm and dry.  Psychiatric: She has a normal mood and affect. Her behavior is normal. Judgment and thought content normal.     Assessment/Plan Decompression and fusion of L2-3 secondary to progressive kyphoscoliosis above fusion at L3-5  Teralyn Mullins J 06/23/2011, 6:18 AM

## 2011-06-23 NOTE — OR Nursing (Signed)
At 1253 patient returned to prone position under supervision of Dr. Danielle Dess. Back prepped from mid thoracic to sacrum bilateral by Dr. Danielle Dess.

## 2011-06-23 NOTE — Op Note (Signed)
Preoperative diagnosis: Kyphoscoliosis and stenosis L2-L3 status post arthrodesis L3-L5. Postoperative diagnosis: Kyphoscoliosis and stenosis at L2-L3 status post arthrodesis L3-L5 Procedure: Bilateral laminotomies and foraminotomies decompression L2-L3 with decompression of L2 and L3 nerve roots greater dissection then for simple interbody arthrodesis. Posterior interbody arthrodesis using peek spacers with autograft and allograft. Removal of previously placed hardware L3-L5, pedicle screw fixation L2-L3 with posterior lateral arthrodesis using allograft and autograft Complications: Aortic injury requiring laparotomy and repair Surgeon: Barnett Abu M.D. first assistant: Daryl Eastern M.D. Anesthesia: Gen. endotracheal Indications: Patient is an 78 year old individual who back in 2004 undergone posterior decompression arthrodesis using a minimally invasive technique unknown and sextant. The patient had done well until the past few years when he had developed increasing back and left lower extremity pain felt to be secondary to adjacent level disease at L2-L3 where she was developing an increasingly more severe stenosis. She was advised regarding the need for surgical decompression and arthrodesis is now taken to the operating room.  Procedure: The patient was brought to the operating room supine on a stretcher. After the smooth induction of general endotracheal anesthesia she was turned prone and the back was prepped with alcohol and DuraPrep. A midline incision was then created and carried down to the lumbar dorsal fascia after localizing radiograph identified the interspinous space at L2-L3. The dissection was carried out to the previously placed hardware and the pedicle screws on either side at L3 were identified. Self-retaining retractor was placed in the wound. Laminotomies were then created removing the inferior margin lamina of L2 out to and including the mesial facet the interspace was then explored and  the common dural tube was protected carefully while thickened redundant ligamentous material was removed from this region to allow for adequate decompression area the disc space was noted be markedly collapsed particularly worse on the left than on the right. Care was taken then to protect each of the nerve roots and sequentially the L3 nerve roots were decompressed bilaterally and in the superior aspect of the exposure the L2 nerve roots were decompressed into the lateral recesses the disc at L2-L3 was noted be severely sclerotic and a discectomy was then performed using a #15 blade opening interspace the combination of curettes rongeurs and disc osteophyte tools to remove osteophytes and the disc in its entirety. The disc space was prepared using the a series of bony curettes that were serrated in order to remove Foley the disc material that was severely degenerated in the disc space and a spacer was placed on the left side. This was 8 mm in height the right side was decompressed here spacer measuring 9 mm in height was placed. Then on attempt to retrieve the spacer on the left side the spacer advanced rather abruptly and on withdrawal was noted be a significant risk amount of bleeding that was bright red from the disc space efforts to tamponade this or not successful and we placed a gauze sponge over laminotomy site on this left side. Dr. Glee Arvin was available and on quick assessment was decided that the patient would need exploration of the abdominal aorta as this was likely injured area area we then quickly placed some sponges in the internal and her space on either side to provide a temporary [place] close the muscle and fascia in 2 layers with interrupted Vicryl sutures. The bleeding continued to present itself between the stitch holes. During this time the patient's systolic blood pressure which had been running about 100 mmHg and  decreased to 60 mmHg fluid resuscitation was performed by the anesthesiologist  and the patient was then turned supine. At this point I enlisted the help of vascular surgery with Dr. Ladona Mow and Dr. Durwin Nora who performed a laparotomy to isolate and close a leak on the undersurface of the aorta at the takeoff of a segmental vessel. When they're portion of the procedure was completed. Turned the patient to the prone position and reopen the previously closed wound.  I then explored the interspace after the laparotomy was completed and with careful and gentle curettage evacuated the interspace of some remnants of bone and decorticated the endplates. The remaining 9 mm spacer was allowed to remain in tact while we prepared 9 mm spacers packed with bone graft to be placed first on the right side than on the left side these were countersunk appropriately the interspace was filled with a combination of autograft from the patient's laminotomy sites and allograft in the form of the cost that had been soaked in blood once the interspace was completely packed fluoroscopic guidance was used to localize the pedicle screws in an to remove the previously placed hardware I used the inferior portion of our incision to access the superior portion of the hardware and then 2 separate stab incisions were created with fluoroscopic guidance over each of the rods to remove the screws that were in L4 and L5 on each side first the screw heads were loosened then the rod was removed then the individual screws were removed. Then fluoroscopic guidance was used to place pedicle screws in L2.  A medial to lateral trajectory was initially planned however when the lateral trajectory was being sounded the lateral aspect of the facet transverse process complex was fractured away. Therefore a lateral to medial trajectory was chosen and this L2 vertebrae and 6.5 x 45 mm screws were individually placed after tapping and sounding the individual holes. In L3 6.5 x 45 mm screws were placed however the left-sided screw appeared to  cut out the easily from the vertebrae and a new trajectory was chosen for it. Short rods measuring 50 mm on the left side and 40 mm on the right side were placed to connect the construct together. The screws were torqued into their final position. Lateral gutters which had previously been decorticated for further decorticated and then packed with a mostly allograft. With hemostasis being secured in lumbar wound the wound was inspected carefully irrigated with antibiotic irrigating solution and then closed with #1 Vicryl in the lumbar dorsal fascia in interrupted fashion 2-0 Vicryl the subcutaneous tissues 3-0 Vicryl subcuticularly. Dermabond was used on the skin. Patient tolerated procedure well blood loss was approximately 6+100 cc and a couple of 100 cc of blood was returned to the patient. She'll be returned to the ICU for further postoperative monitoring.

## 2011-06-23 NOTE — Progress Notes (Signed)
Patient ID: Jean [name], female   DOB: December 18, 1928, 78 y.o.   MRN: [id number] Vital signs are stable. Postoperative temperature was a little low. Patient is slow to wake up but she does move her lower extremities. Urine output has been adequate. Dressings are dry. Patient will be maintained in intensive care unit overnight.

## 2011-06-23 NOTE — OR Nursing (Signed)
Lumbar two-three fusion completed at 1517. Due to emergency situation, unable to count at beginning of procedure. Digital x-ray completed at 1530

## 2011-06-23 NOTE — Preoperative (Signed)
Beta Blockers   Reason not to administer Beta Blockers:Not Applicable, last dose 06/23/11 at 03:30

## 2011-06-23 NOTE — Transfer of Care (Signed)
Immediate Anesthesia Transfer of Care Note  Patient: Jean [name]  Procedure(s) Performed: Procedure(s) (LRB): POSTERIOR LUMBAR FUSION 1 WITH HARDWARE REMOVAL (N/A) EXPLORATORY LAPAROTOMY (Bilateral) ABDOMINAL EXPOSURE (N/A)  Patient Location: PACU  Anesthesia Type: General  Level of Consciousness: awake  Airway & Oxygen Therapy: Patient Spontanous Breathing and Patient connected to face mask oxygen  Post-op Assessment: Report given to PACU RN and Post -op Vital signs reviewed and stable  Post vital signs: Reviewed and stable  Complications: No apparent anesthesia complications

## 2011-06-23 NOTE — OR Nursing (Signed)
Exploratory Laporatomy completed at 1228. Due to emergency situation, unable to count at beginning of procedure. Digital x-ray completed at 1234.

## 2011-06-24 ENCOUNTER — Encounter (HOSPITAL_COMMUNITY): Payer: Self-pay | Admitting: *Deleted

## 2011-06-24 DIAGNOSIS — R404 Transient alteration of awareness: Secondary | ICD-10-CM

## 2011-06-24 DIAGNOSIS — Z9889 Other specified postprocedural states: Secondary | ICD-10-CM

## 2011-06-24 LAB — POCT I-STAT 7, (LYTES, BLD GAS, ICA,H+H)
Bicarbonate: 23.7 mEq/L (ref 20.0–24.0)
Calcium, Ion: 1.16 mmol/L (ref 1.12–1.32)
HCT: 26 % — ABNORMAL LOW (ref 36.0–46.0)
Hemoglobin: 8.8 g/dL — ABNORMAL LOW (ref 12.0–15.0)
Hemoglobin: 11.6 g/dL — ABNORMAL LOW (ref 12.0–15.0)
Patient temperature: 33.7
Potassium: 4.2 mEq/L (ref 3.5–5.1)
pCO2 arterial: 39.4 mmHg (ref 35.0–45.0)
pH, Arterial: 7.389 (ref 7.350–7.400)
pH, Arterial: 7.385 (ref 7.350–7.400)
pO2, Arterial: 500 mmHg — ABNORMAL HIGH (ref 80.0–100.0)

## 2011-06-24 LAB — BASIC METABOLIC PANEL
CO2: 19 mEq/L (ref 19–32)
Chloride: 110 mEq/L (ref 96–112)
GFR calc Af Amer: 32 mL/min — ABNORMAL LOW (ref >90)
Potassium: 4.8 mEq/L (ref 3.5–5.1)
Sodium: 138 mEq/L (ref 135–145)

## 2011-06-24 LAB — POCT I-STAT 4, (NA,K, GLUC, HGB,HCT)
Glucose, Bld: 127 mg/dL — ABNORMAL HIGH (ref 70–99)
HCT: 32 % — ABNORMAL LOW (ref 36.0–46.0)
Hemoglobin: 10.9 g/dL — ABNORMAL LOW (ref 12.0–15.0)
Sodium: 140 mEq/L (ref 135–145)

## 2011-06-24 LAB — TYPE AND SCREEN
Antibody Screen: NEGATIVE
Unit division: 0

## 2011-06-24 LAB — CBC
Platelets: 97 10*3/uL — ABNORMAL LOW (ref 150–400)
RBC: 4.01 MIL/uL (ref 3.87–5.11)
RDW: 13.6 % (ref 11.5–15.5)
WBC: 14.1 10*3/uL — ABNORMAL HIGH (ref 4.0–10.5)

## 2011-06-24 MED ORDER — LEVOTHYROXINE SODIUM 75 MCG PO TABS
75.0000 ug | ORAL_TABLET | Freq: Every day | ORAL | Status: DC
  Filled 2011-06-24: qty 1

## 2011-06-24 MED ORDER — LEVOTHYROXINE SODIUM 75 MCG PO TABS
75.0000 ug | ORAL_TABLET | Freq: Every day | ORAL | Status: DC
  Administered 2011-06-24 – 2011-07-02 (×9): 75 ug via ORAL
  Filled 2011-06-24 (×10): qty 1

## 2011-06-24 MED ORDER — SODIUM CHLORIDE 0.9 % IV BOLUS (SEPSIS)
500.0000 mL | Freq: Once | INTRAVENOUS | Status: AC
  Administered 2011-06-24: 500 mL via INTRAVENOUS

## 2011-06-24 MED ORDER — METOPROLOL TARTRATE 100 MG PO TABS
100.0000 mg | ORAL_TABLET | Freq: Two times a day (BID) | ORAL | Status: DC
  Administered 2011-06-24 – 2011-06-26 (×4): 100 mg via ORAL
  Filled 2011-06-24 (×5): qty 1

## 2011-06-24 MED ORDER — PANTOPRAZOLE SODIUM 40 MG PO TBEC
40.0000 mg | DELAYED_RELEASE_TABLET | Freq: Every day | ORAL | Status: DC
  Administered 2011-06-24 – 2011-07-02 (×9): 40 mg via ORAL
  Filled 2011-06-24 (×10): qty 1

## 2011-06-24 MED ORDER — METOPROLOL TARTRATE 100 MG PO TABS
100.0000 mg | ORAL_TABLET | Freq: Two times a day (BID) | ORAL | Status: DC
  Filled 2011-06-24: qty 1

## 2011-06-24 MED ORDER — CHLORHEXIDINE GLUCONATE 0.12 % MT SOLN
15.0000 mL | Freq: Two times a day (BID) | OROMUCOSAL | Status: DC
  Administered 2011-06-24 – 2011-06-26 (×6): 15 mL via OROMUCOSAL
  Filled 2011-06-24 (×6): qty 15

## 2011-06-24 MED ORDER — BIOTENE DRY MOUTH MT LIQD
15.0000 mL | Freq: Two times a day (BID) | OROMUCOSAL | Status: DC
  Administered 2011-06-24 – 2011-07-02 (×15): 15 mL via OROMUCOSAL

## 2011-06-24 MED ORDER — PANTOPRAZOLE SODIUM 40 MG IV SOLR: 40.0000 mg | Freq: Every day | INTRAVENOUS | Status: DC

## 2011-06-24 MED ORDER — FENTANYL CITRATE 0.05 MG/ML IJ SOLN
25.0000 ug | INTRAMUSCULAR | Status: DC | PRN
  Administered 2011-06-24: 50 ug via INTRAVENOUS
  Administered 2011-06-24: 25 ug via INTRAVENOUS
  Administered 2011-06-24 – 2011-07-01 (×7): 50 ug via INTRAVENOUS
  Filled 2011-06-24 (×10): qty 2

## 2011-06-24 NOTE — Progress Notes (Signed)
Name: Jean [name] MRN: [id number] DOB: 05/19/1928    LOS: 1  PCCM PROGRESS NOTE  Active Problems:  Status post lumbar surgery  Aortic injury, S/P vascular surgery  ARF (acute renal failure)  HYPOTHYROIDISM, UNSPECIFIED  HYPERTENSION  CORONARY ATHEROSCLEROSIS NATIVE CORONARY ARTERY   History of Present Illness: 78 y/o female admitted on 06/23/2011 for an elective L2-L3 lumbar surgery complicated by aortic injury requiring laparotomy and repair by Vascular surgery. During the procedure she lost an estimated 1800cc of blood and received 3400cc of LR, 1000cc NS, 500cc hetastarch, 600cc of Cell Saver, and 2 U PRBC.  Lines / Drains: Peripheral IV  Cultures: None  Antibiotics: Vanc 4/22 (perioperative vascular) x1  Tests / Events:  4/22 (Dr. Danielle Dess) Bilateral laminotomies and foraminotomies decompression L2-L3 with decompression of L2 and L3 nerve roots greater dissection then for simple interbody arthrodesis. Posterior interbody arthrodesis using peek spacers with autograft and allograft. Removal of previously placed hardware L3-L5, pedicle screw fixation L2-L3 with posterior lateral arthrodesis using allograft and autograft   4/22 (Dr. Imogene Burn) Exploratory celiotomy and repair of aorta   Vital Signs: Temp:  [95 F (35 C)-99.3 F (37.4 C)] 97.4 F (36.3 C) (04/23 0754) Pulse Rate:  [47-80] 80  (04/23 0700) Resp:  [9-20] 18  (04/23 0700) BP: (105-187)/(52-92) 124/62 mmHg (04/23 0700) SpO2:  [95 %-100 %] 97 % (04/23 0700) Arterial Line BP: (84-186)/(52-102) 158/67 mmHg (04/23 0700) Weight:  [157 lb 3 oz (71.3 kg)] 157 lb 3 oz (71.3 kg) (04/22 2246) I/O last 3 completed shifts: In: 7103 [I.V.:5303; Blood:1300; IV Piggyback:500] Out: 2428 [Urine:628; Blood:1800]  Physical Examination:  general: resting in bed, not in acute distress HEENT: PERRL, EOMI, no scleral icterus Cardiac: S1/S2, RRR, No murmurs, gallops or rubs Pulm: slightly decreased air movement on right side,   Clear to auscultation bilaterally, No rales, wheezing, rhonchi or rubs. Abd: Soft,  nondistended, has tenderness over the surgical site, no organomegaly, BS present Ext: No rashes or edema, 2+DP/PT pulse bilaterally. Has back pain Neuro: alert and oriented X3, cranial nerves II-XII grossly intact, muscle strength 5/5 in all extremeties,  sensation to light touch intact.   Ventilator settings: not on ventilator   Labs and Imaging:  Reviewed.  Please refer to the Assessment and Plan section for relevant results.  ASSESSMENT AND PLAN  NEUROLOGIC A:  Has history of CVA without motor deficit. No acute issue. Fair pain control.  Status post L2-L3 decompression. P: -->  hold home ASA and plavix -->  continue mecline for dizziness -->  D/c Morphine as transiently hypotensive -->  Fentanyl 25-50 q2h PRN -->  Follow neurosurgery recommendations   PULMONARY  A:  Atelectasis due to pain. P: -->  pain control as above -->  Incentive spirometry  CARDIOVASCULAR  Lab 06/23/11 2250  TROPONINI <0.30  LATICACIDVEN --  PROBNP --   A:  History of CAD and MI s/p stent. History of HTN, transiently hypotensive.  Intraoperative aortic injury. P: -->  Hold ASA and plavix for now -->  Hold Methyldopa / Metoprolol -->  Follow vascular recommendations  RENAL  Lab 06/24/11 0443 06/23/11 2250 06/20/11 1154  NA 138 137 140  K 4.8 4.6 --  CL 110 110 104  CO2 19 19 27   BUN 27* 25* 24*  CREATININE 1.66* 1.42* 1.36*  CALCIUM 7.7* 7.2* 9.9  MG -- -- --  PHOS -- -- --   A:  Acute on chronic renal failure, likely prerenal due to blood loss. P: -->  IVF NS at 100/ml -->  BMP in AM  GASTROINTESTINAL  A:  No acute issue P:  Protonix for GI Px  HEMATOLOGIC  Lab 06/24/11 0443 06/23/11 2250 06/20/11 1154  HGB 11.8* 11.2* 12.7  HCT 34.4* 33.2* 37.6  PLT 97* 93* 165  INR -- 1.29 --  APTT -- -- --   A:   Anemia due to blood blood. Stable Hb after transfusion.  P: -->  CBC in  AM  INFECTIOUS  Lab 06/24/11 0443 06/23/11 2250 06/20/11 1154  WBC 14.1* 13.4* 6.4  PROCALCITON -- -- --   A:  No signs of infection, leukocytosis is likely due to stress. P: -->  CBC in AM  ENDOCRINE  Lab 06/23/11 2147  GLUCAP 148*   A:  No DM. H/o hypothymism.  P: --> continue synthroid --> check TSH --> check cortisol lelvel  BEST PRACTICE / DISPOSITION -->  ICU status under Neurosurgery -->  Vascular / PCCM consulting -->  Full code -->  Sips of fluid -->  DVT Px: SCD -->  Protonix IV for GI Px -->  Family updated  Lorretta Harp, MD PGY1, Internal Medicine Teaching Service Pager: [phone] 06/24/2011, 7:57 AM  Patient examined.  Records reviewed.  Assessment and plan above is edited and discussed with ICU Resident Team.  Orlean Bradford, M.D., F.C.C.P. Pulmonary and Critical Care Medicine [facility] Cell: [phone] Pager: [phone]

## 2011-06-24 NOTE — Progress Notes (Signed)
eLink Physician-Brief Progress Note Patient Name: Jean [name] DOB: [birth year] MRN: [id number]  Date of Service  06/24/2011   HPI/Events of Note   Pt hypertensive.  On lopressor at home  eICU Interventions  Resume  lopressor but at 100mg  bid    Intervention Category Major Interventions: Hypertension - evaluation and management  Shan Levans 06/24/2011, 4:54 PM

## 2011-06-24 NOTE — Clinical Documentation Improvement (Signed)
    SHOCK DOCUMENTATION CLARIFICATION QUERY  THIS DOCUMENT IS NOT A PERMANENT PART OF THE MEDICAL RECORD  TO RESPOND TO THE THIS QUERY, FOLLOW THE INSTRUCTIONS BELOW:  1. If needed, update documentation for the patient's encounter via the notes activity.  2. Access this query again and click edit on the In [employer].  3. After updating, or not, click F2 to complete all highlighted (required) fields concerning your review. Select "additional documentation in the medical record" OR "no additional documentation provided".  4. Click Sign note button.  5. The deficiency will fall out of your In Basket *Please let us know if you are not able to complete this workflow by phone or e-mail (listed below).  Please update your documentation within the medical record to reflect your response to this query.                                                                                        06/24/11   Dear Dr.Anaid Haney  In a better effort to capture your patient's severity of illness, reflect appropriate length of stay and utilization of resources, a review of the patient medical record has revealed the following indicators. Based on your clinical judgment, please clarify and document in a progress note and/or discharge summary the clinical condition associated with the following supporting information: In responding to this query please exercise your independent judgment.  The fact that a query is asked, does not imply that any particular answer is desired or expected.  Possible Clinical Condition?  Hypovolemic Shock  Hemorrhagic Shock  Other Condition  Cannot Clinically determine  Signs & Symptoms: per progress notes:The bleeding continued to present itself between the stitch holes. During this time the patient's systolic blood pressure which had been running about 100 mmHg and decreased to 60 mmHg fluid resuscitation was performed by the anesthesiologist and the patient was then turned supine;  had significant bleeding intraoperatively;  VS:   BP: 84/70 mmHg  HR: 84  Organ Dysfunction:  Circulatory:SBP: 60   Kidney: per progress notes: acute renal failure (likely prerenal due to blood loss.  Diagnostics:  Treatment: Exploratory Laporotomy,  Exploratory celiotomy Repair of aorta  Others: During the procedure she lost an estimated 1800cc of blood and received 3400cc of LR, 1000cc NS, 500cc hetastarch, 600cc of Cell Saver, and 2 U PRBC   You may use possible, probable, or suspect with inpatient documentation. possible, probable, suspected diagnoses MUST be documented at the time of discharge  Reviewed:  no additional documentation provided  Thank You,  Amada Kingfisher  RN, BSN, CCM Clinical Documentation Specialist: [email or web address] [phone]   Health Information Management [place]

## 2011-06-24 NOTE — Progress Notes (Signed)
Followed up with Resident regarding pt's low UOP and blood pressure increasing. Resident ordered 500cc bolus. Will continue to monitor blood pressure at this time.

## 2011-06-24 NOTE — Progress Notes (Signed)
UR COMPLETED  

## 2011-06-24 NOTE — Evaluation (Signed)
Occupational Therapy Evaluation Patient Details Name: Jean [name] MRN: [id number] DOB: [birth year] Today's Date: 06/24/2011 Time: 1308-6578 OT Time Calculation (min): 35 min  OT Assessment / Plan / Recommendation Clinical Impression  L2-3 fusion with removal of L3-5 prior hardware with aortic injury during surgery and laparotomy. Pt presents with below problem list and will benefit from skilled OT in the acute setting to maximize I with ADL and ADL mobility prior to d/c    OT Assessment  Patient needs continued OT Services    Follow Up Recommendations  Skilled nursing facility (may benefit from CIR pending pt progress)    Equipment Recommendations  Defer to next venue    Frequency Min 1X/week    Precautions / Restrictions Precautions Precautions: Back Precaution Booklet Issued: Yes (comment) Precaution Comments: handout given to husband due to pt's lethargy. Pt with sore stomach due to laparotomy  Required Braces or Orthoses: Spinal Brace Spinal Brace: Applied in sitting position;Lumbar corset Restrictions Weight Bearing Restrictions: No   Pertinent Vitals/Pain Pt c/o "belly" pain at incision site. RN informed    ADL  Eating/Feeding: Simulated;+1 Total assistance Where Assessed - Eating/Feeding: Chair Grooming: Simulated;Maximal assistance Where Assessed - Grooming: Supported sitting Upper Body Bathing: Simulated;+1 Total assistance Where Assessed - Upper Body Bathing: Sitting, chair Lower Body Bathing: Simulated;+2 Total assistance (+2 needed for sit to stand and to perform task) Where Assessed - Lower Body Bathing: Sit to stand from bed Upper Body Dressing: Performed;+1 Total assistance Where Assessed - Upper Body Dressing: Sitting, bed Lower Body Dressing: Performed;+1 Total assistance (don socks) Where Assessed - Lower Body Dressing: Sitting, bed Toilet Transfer: Simulated;+2 Total assistance (pt=40%) Toilet Transfer Method: Stand pivot Toileting - Clothing  Manipulation: Simulated;+1 Total assistance Where Assessed - Glass blower/designer Manipulation: Standing Toileting - Hygiene: Simulated;+1 Total assistance Where Assessed - Toileting Hygiene: Standing Tub/Shower Transfer: Not assessed Equipment Used: Back brace;Rolling walker Ambulation Related to ADLs: +2totalA (pt=40%) for stand-pivot from EOB to chair. Pt with kyphotic posture and attempting to sit throughout transfer despite VC and physical assist to remain standing upright. Also, manual facilitation to weight shift and advance bil LE ADL Comments: Pt extremely lethargic and following one-step commands ~20% of session    OT Goals Acute Rehab OT Goals OT Goal Formulation: With patient/family Time For Goal Achievement: 07/08/11 Potential to Achieve Goals: Good ADL Goals Pt Will Perform Grooming: with supervision;Standing at sink ADL Goal: Grooming - Progress: Goal set today Pt Will Perform Upper Body Bathing: with supervision;with set-up;Sitting, chair;Sitting at sink;Sitting, edge of bed ADL Goal: Upper Body Bathing - Progress: Goal set today Pt Will Perform Lower Body Bathing: with min assist;Sit to stand from chair;Sit to stand from bed ADL Goal: Lower Body Bathing - Progress: Goal set today Pt Will Perform Upper Body Dressing: with supervision;with set-up;Sitting, bed ADL Goal: Upper Body Dressing - Progress: Goal set today Pt Will Perform Lower Body Dressing: with min assist;Sit to stand from chair;Sit to stand from bed ADL Goal: Lower Body Dressing - Progress: Goal set today Pt Will Transfer to Toilet: with supervision;Ambulation;with DME;3-in-1 ADL Goal: Toilet Transfer - Progress: Goal set today Pt Will Perform Toileting - Hygiene: with set-up;with supervision;Sit to stand from 3-in-1/toilet;Sitting on 3-in-1 or toilet ADL Goal: Toileting - Hygiene - Progress: Goal set today Additional ADL Goal #1: Pt will verbalize/generalize 3/3 back precautions in prep for ADLs ADL Goal:  Additional Goal #1 - Progress: Goal set today  Visit Information  Last OT Received On: 06/24/11 Assistance Needed: +2 PT/OT  Co-Evaluation/Treatment: Yes    Subjective Data  Subjective: My belly hurts Patient Stated Goal: Return home with husband   Prior Functioning  Home Living Lives With: Spouse Type of Home: House Home Access: Stairs to enter Secretary/administrator of Steps: 2 Entrance Stairs-Rails: None Home Layout: One level Bathroom Shower/Tub: Health visitor: Standard Home Adaptive Equipment: Walker - standard;Walker - rolling Prior Function Level of Independence: Needs assistance Needs Assistance: Light Housekeeping;Meal Prep Meal Prep: Other (comment) Light Housekeeping: Moderate Able to Take Stairs?: Yes Driving: No Vocation: Retired Comments: Per spouse pt was able to perform basic ADLs, transfers and gait without assist Communication Communication: No difficulties Dominant Hand: Right    Cognition  Overall Cognitive Status: Impaired Area of Impairment: Memory;Attention Arousal/Alertness: Lethargic Orientation Level: Disoriented to;Time Behavior During Session: Lethargic Current Attention Level: Focused Memory: Decreased recall of precautions Memory Deficits: Pt with inaccurate history of PLOF Cognition - Other Comments: Pt lethargic with decreased attention and ability to follow commands potentially due to medication    Extremity/Trunk Assessment Right Upper Extremity Assessment RUE ROM/Strength/Tone: Unable to fully assess;Due to impaired cognition;Due to precautions Left Upper Extremity Assessment LUE ROM/Strength/Tone: Unable to fully assess;Due to precautions;Due to impaired cognition Right Lower Extremity Assessment RLE ROM/Strength/Tone: Unable to fully assess;Due to impaired cognition Left Lower Extremity Assessment LLE ROM/Strength/Tone: Unable to fully assess;Due to impaired cognition Trunk Assessment Trunk Assessment:  Kyphotic (pre-morbid per husband)   Mobility Bed Mobility Bed Mobility: Rolling Right;Right Sidelying to Sit Rolling Right: 1: +2 Total assist Rolling Right: Patient Percentage: 10% Right Sidelying to Sit: 1: +2 Total assist;HOB flat Right Sidelying to Sit: Patient Percentage: 10% Details for Bed Mobility Assistance: max cueing and facilitation with pad to rotate trunk and pelvis with full assist to bring bil LE off EOB, pt did assist with bending knees to roll Transfers Sit to Stand: 1: +2 Total assist;From bed Sit to Stand: Patient Percentage: 30% Stand to Sit: 1: +2 Total assist;To chair/3-in-1 Stand to Sit: Patient Percentage: 40% Details for Transfer Assistance: Pt stood x 2 from bed with facilitation for anterior translation, hand placement and facilitation of bil LE to push into standing. Pt with posterior right lean EOB and in standing requiring facilitation to maintain upright and attempt to facilitate midline in multiple ways with 2 therapists beside pt and transition to PT behind pt to use therapist hip to facilitate pt anterior sacral translation. Pt fatigued and difficulty following commands and after pivot with feet pt sitting on PT thigh before assist to sit in chair despite facilitation and cueing. Used RW for standing and pivot attempted hand held and pt unable to assist with maintaining upright.        Balance Balance Balance Assessed: Yes Static Sitting Balance Static Sitting - Balance Support: Bilateral upper extremity supported;Feet supported Static Sitting - Level of Assistance: 3: Mod assist Static Sitting - Comment/# of Minutes: posterior right lean despite facilitation for RUE in lap and cueing toward left Static Standing Balance Static Standing - Balance Support: Bilateral upper extremity supported Static Standing - Level of Assistance: 1: +1 Total assist Static Standing - Comment/# of Minutes:  End of Session OT - End of Session Equipment Utilized During  Treatment: Gait belt Activity Tolerance: Patient limited by pain;Patient limited by fatigue;Treatment limited secondary to medication Patient left: in chair;with call bell/phone within reach;with family/visitor present Nurse Communication: Mobility status;Precautions   Clatie Kessen 06/24/2011, 10:14 AM

## 2011-06-24 NOTE — Plan of Care (Signed)
Problem: Consults Goal: Spinal Surgery Patient Education See Patient Education Module for education specifics. Outcome: Completed/Met Date Met:  06/24/11 Completed pre-op prior to 2100 admit Goal: Diagnosis - Spinal Surgery Outcome: Completed/Met Date Met:  06/24/11 Lumbar Laminectomy (Complex) Goal: Skin Care Protocol Initiated - if indicated If consults are not indicated, leave blank or document N/A Outcome: Completed/Met Date Met:  06/24/11 Turn q2 hours, ot and pt ordered

## 2011-06-24 NOTE — Progress Notes (Signed)
Pt gotten up to chair with PT. Pt became very lethargic in chair. BP softened (80s/50s). Got pt back to bed to rest. Pt very difficult to transfer back to bed.Pt offered very minimal assistance. Pt UOP 10-15cc per hour. MD notified. No further orders at this time. Pain medication changed to fentanyl. BP medications discontinued at this time.

## 2011-06-24 NOTE — Progress Notes (Signed)
Chaplain visited with pt, who was awake, alert, and resting in bed.  Chaplain introduced Zinia Innocent and offered spiritual comfort and support.  Pt expressed appreciation for chaplain support but had no specific needs at this time.  Chaplain will follow up as needed.   06/24/11 1500  Clinical Encounter Type  Visited With Patient;Health care provider (Pt's nurse was present)  Visit Type Initial;Spiritual support  Referral From Other (Comment) (Basia Mcginty-referral)  Spiritual Encounters  Spiritual Needs Emotional  Stress Factors  Patient Stress Factors Health changes  Family Stress Factors None identified (No family present at this time)   Verdie Shire, chaplain resident [phone]

## 2011-06-24 NOTE — Progress Notes (Signed)
[facility]  Pt's oliguria likely mutifactorial: baseline chronic renal disease, acute intraoperative hypotension, and brief aortic cross clamp.  The cross clamping did not last more than 5 minutes in total and was in the infrarenal position but superior to to the right inferior polar renal artery.  Would obtain a renal arterial duplex, draw urine electrolytes with a chemistry to calculate a FEN, urine microscopy to look for ATN, and continue aggressive resuscitation.  Third spacing, and subsequently intravascular volume depletion, in s/p aortic patient is a common source of oliguria.  If no significant response by AM, would probably get Nephrology input.  Leonides Sake, MD [facility] Office: [phone] Pager: [phone]  06/24/2011, 9:56 PM

## 2011-06-24 NOTE — Evaluation (Signed)
Physical Therapy Evaluation Patient Details Name: Jean [name] MRN: [id number] DOB: 10-01-1928 Today's Date: 06/24/2011 Time: 4098-1191 PT Time Calculation (min): 31 min  PT Assessment / Plan / Recommendation Clinical Impression  Pt s/p L2-3 fusion with removal of L3-5 prior hardware with aortic injury during surgery and laparotomy. Pt will benefit from acute therapy to maximize mobility, strength, transfers, gait and independence before discharge to next venue to decrease burden of care. Pt limited by impaired cognition and pain today. Spouse present and educated for precautions and plan.     PT Assessment  Patient needs continued PT services    Follow Up Recommendations  Inpatient Rehab    Equipment Recommendations  Defer to next venue    Frequency Min 5X/week    Precautions / Restrictions Precautions Precautions: Back Precaution Booklet Issued: Yes (comment) Precaution Comments: handout given to husband due to pt's lethargy. Pt with sore stomach due to laparotomy  Required Braces or Orthoses: Spinal Brace Spinal Brace: Applied in sitting position;Lumbar corset Restrictions Weight Bearing Restrictions: No   Pertinent Vitals/Pain Pt reports abdominal and back pain with varied movements during eval. Unable to rate and premedicated      Mobility  Bed Mobility Bed Mobility: Rolling Right;Right Sidelying to Sit Rolling Right: 1: +2 Total assist Rolling Right: Patient Percentage: 10% Right Sidelying to Sit: 1: +2 Total assist;HOB flat Right Sidelying to Sit: Patient Percentage: 10% Details for Bed Mobility Assistance: max cueing and facilitation with pad to rotate trunk and pelvis with full assist to bring bil LE off EOB, pt did assist with bending knees to roll Transfers Transfers: Sit to Stand;Stand to Sit;Stand Pivot Transfers Sit to Stand: 1: +2 Total assist;From bed Sit to Stand: Patient Percentage: 30% Stand to Sit: 1: +2 Total assist;To chair/3-in-1 Stand to Sit:  Patient Percentage: 40% Stand Pivot Transfers: 1: +2 Total assist Stand Pivot Transfers: Patient Percentage: 40% Details for Transfer Assistance: Pt stood x 2 from bed with facilitation for anterior translation, hand placement and facilitation of bil LE to push into standing. Pt with posterior right lean EOB and in standing requiring facilitation to maintain upright and attempt to facilitate midline in multiple ways with 2 therapists beside pt and transition to PT behind pt to use therapist hip to facilitate pt anterior sacral translation. Pt fatigued and difficulty following commands and after pivot with feet pt sitting on PT thigh before assist to sit in chair despite facilitation and cueing. Used RW for standing and pivot attempted hand held and pt unable to assist with maintaining upright.     Exercises     PT Goals Acute Rehab PT Goals PT Goal Formulation: With patient/family Time For Goal Achievement: 07/08/11 Potential to Achieve Goals: Fair Pt will Roll Supine to Right Side: with min assist PT Goal: Rolling Supine to Right Side - Progress: Goal set today Pt will go Supine/Side to Sit: with min assist PT Goal: Supine/Side to Sit - Progress: Goal set today Pt will go Sit to Supine/Side: with min assist PT Goal: Sit to Supine/Side - Progress: Goal set today Pt will go Sit to Stand: with mod assist PT Goal: Sit to Stand - Progress: Goal set today Pt will go Stand to Sit: with mod assist PT Goal: Stand to Sit - Progress: Goal set today Pt will Transfer Bed to Chair/Chair to Bed: with mod assist PT Transfer Goal: Bed to Chair/Chair to Bed - Progress: Goal set today Pt will Ambulate: 16 - 50 feet;with min assist;with least restrictive  assistive device PT Goal: Ambulate - Progress: Goal set today Additional Goals Additional Goal #1: Pt will independently state and demonstrate awareness of 3/3 back precautions PT Goal: Additional Goal #1 - Progress: Goal set today  Visit Information   Last PT Received On: 06/24/11 Assistance Needed: +2 PT/OT Co-Evaluation/Treatment: Yes    Subjective Data  Subjective: oh, i can't Patient Stated Goal: unstated agreeable to mobility and returning home with spouse   Prior Functioning  Home Living Lives With: Spouse Type of Home: House Home Access: Stairs to enter Secretary/administrator of Steps: 2 Entrance Stairs-Rails: None Home Layout: One level Bathroom Shower/Tub: Health visitor: Standard Home Adaptive Equipment: Walker - standard;Walker - rolling Prior Function Level of Independence: Needs assistance Needs Assistance: Light Housekeeping;Meal Prep Meal Prep: Other (comment) (neither cook they go out to eat) Light Housekeeping: Moderate Able to Take Stairs?: Yes Driving: No Vocation: Retired Comments: Per spouse pt was able to perform basic ADLs, transfers and gait without assist Communication Communication: No difficulties    Cognition  Overall Cognitive Status: Impaired Area of Impairment: Memory;Attention Arousal/Alertness: Lethargic Orientation Level: Time;Disoriented to Behavior During Session: Lethargic Current Attention Level: Focused Memory: Decreased recall of precautions Memory Deficits: Pt with inaccurate history of PLOF Cognition - Other Comments: Pt lethargic with decreased attention and ability to follow commands potentially due to medication    Extremity/Trunk Assessment Right Lower Extremity Assessment RLE ROM/Strength/Tone: Unable to fully assess;Due to impaired cognition Left Lower Extremity Assessment LLE ROM/Strength/Tone: Unable to fully assess;Due to impaired cognition   Balance Balance Balance Assessed: Yes Static Sitting Balance Static Sitting - Balance Support: Bilateral upper extremity supported;Feet supported Static Sitting - Level of Assistance: 3: Mod assist Static Sitting - Comment/# of Minutes: posterior right lean despite facilitation for RUE in lap and cueing  toward left Static Standing Balance Static Standing - Balance Support: Bilateral upper extremity supported Static Standing - Level of Assistance: 1: +1 Total assist Static Standing - Comment/# of Minutes:  End of Session PT - End of Session Equipment Utilized During Treatment: Gait belt;Back brace Activity Tolerance: Patient limited by fatigue Patient left: in chair;with call bell/phone within reach;with family/visitor present Nurse Communication: Mobility status;Precautions   Delorse Lek 06/24/2011, 9:36 AM  Delaney Meigs, PT [phone]

## 2011-06-24 NOTE — Progress Notes (Signed)
Subjective: Patient reports Offers no real complaints denies nausea as back pain and abdominal pain.  Objective: Vital signs in last 24 hours: Temp:  [95 F (35 C)-99.3 F (37.4 C)] 97.4 F (36.3 C) (04/23 0754) Pulse Rate:  [47-110] 82  (04/23 1100) Resp:  [9-27] 19  (04/23 1100) BP: (89-187)/(52-92) 112/56 mmHg (04/23 1100) SpO2:  [93 %-100 %] 96 % (04/23 1100) Arterial Line BP: (84-186)/(52-102) 119/55 mmHg (04/23 1100) Weight:  [71.3 kg (157 lb 3 oz)] 71.3 kg (157 lb 3 oz) (04/22 2246)  Intake/Output from previous day: 04/22 0701 - 04/23 0700 In: 7103 [I.V.:5303; Blood:1300; IV Piggyback:500] Out: 2428 [Urine:628; Blood:1800] Intake/Output this shift: Total I/O In: 400 [I.V.:400] Out: 35 [Urine:35]  Dressings are clean and dry. Motor function is intact in lower extremities.  Lab Results:  Basename 06/24/11 0443 06/23/11 2250  WBC 14.1* 13.4*  HGB 11.8* 11.2*  HCT 34.4* 33.2*  PLT 97* 93*   BMET  Basename 06/24/11 0443 06/23/11 2250  NA 138 137  K 4.8 4.6  CL 110 110  CO2 19 19  GLUCOSE 150* 153*  BUN 27* 25*  CREATININE 1.66* 1.42*  CALCIUM 7.7* 7.2*    Studies/Results: Dg Lumbar Spine 2-3 Views  06/23/2011  *RADIOLOGY REPORT*  Clinical Data: Lumbar fusion.  LUMBAR SPINE - 2-3 VIEW  Comparison: The lumbar spine MRI 04/18/2011.  Findings: There are pedicle screws, posterior rods and interbody bone spacer fusing L2-3.  The L3-4 hardware has been removed.  IMPRESSION: L2-3 fusion.  Original Report Authenticated By: P. Loralie Champagne, M.D.   Dg Lumbar Spine 1 View  06/23/2011  *RADIOLOGY REPORT*  Clinical Data: Posterior lumbar fusion (L2 - L3) with hardware removal  LUMBAR SPINE - 1 VIEW  Comparison: Lumbar spine MRI - 04/18/2011  Findings: Lumbar spine labeling is in keeping with labeling provided on preprocedural lumbar spine MRI.  There is a marking needle tip overlying the soft tissues posterior to the inferior aspect of the L3 vertebral body.  Post L3 - L5  paraspinal fusion and intervertebral disc replacement, incompletely evaluated.  IMPRESSION: Intraoperative lumbar spine localization as above.  Original Report Authenticated By: Waynard Reeds, M.D.   Dg Chest Portable 1 View  06/23/2011  *RADIOLOGY REPORT*  Clinical Data: Postoperative exam  PORTABLE CHEST - 1 VIEW  Comparison: Intraoperative imaging lumbar spine same date, chest radiograph 05/16/2011  Findings: Lumbar fusion hardware partly visualized.  Lung volumes are low with curvilinear left greater than right lower lobe presumed atelectasis.  No pleural effusion.  Heart size is normal. No pneumothorax.  The provided clinical history references support apparatus, but no typical thoracic support apparatus is visualized.  IMPRESSION: Left greater than right lower lobe presumed atelectasis.  Original Report Authenticated By: Harrel Lemon, M.D.   Dg Or Local Abdomen  06/23/2011  *RADIOLOGY REPORT*  Clinical Data: Postop final instrumentation count.  OR LOCAL ABDOMEN  Comparison: 06/23/2011.  Findings: Interval L2-3 fusion with interbody spacer.  Removal of hardware from L3 - L5.  L2-3 spacer and radiopaque sponges seen on the examination of 1241 hours the same day have been removed in the interval.  Surgical skin staples are in place.  Nasogastric terminates in the stomach.  IMPRESSION: Interval removal of L3-5 hardware with placement of L2-3 fusion hardware.  No unexpected radiopaque foreign bodies.  Original Report Authenticated By: Reyes Ivan, M.D.   Dg Or Local Abdomen  06/23/2011  *RADIOLOGY REPORT*  Clinical Data: Final instrumentation count.  Anterior surgical  approach.  OR LOCAL ABDOMEN  Comparison: MR lumbar spine 04/18/2011 and lumbar spine series 04/10/2011.  Findings: There are no radiopaque foreign bodies.  A square shaped metallic structure projecting over the L2-3 interspace is consistent with a spacer.  Curvilinear metallic structures projecting over the upper lumbar spine  presumably represent sponge packing.  Surgical skin staples are seen in the midline. Nasogastric tube terminates in the stomach.  IMPRESSION: No radiopaque foreign body.  Original Report Authenticated By: Reyes Ivan, M.D.    Assessment/Plan: Stable postop day 1  LOS: 1 day  Start sips and chips. Mobilize out of bed as tolerated with brace on.   [name] J 06/24/2011, 11:36 AM

## 2011-06-24 NOTE — Progress Notes (Signed)
Spoke with Dr. Delford Field regarding pt's increasing BP, decrease UOP, and abdominal pain. MD suggests beta blocker at this time. MD will write order.

## 2011-06-24 NOTE — Progress Notes (Signed)
Physician-Brief Progress Note Patient Name: Jean [name] DOB: 08-07-1928 MRN: [id number]  Date of Service  06/24/2011   HPI/Events of Note  Patient's urine output remains low. Foley in place. Patient has CKD with baseline (1.3-1.5).   eICU Interventions  Will give her a a bolus of 500cc NS and reassess in 2 hours.      Stanly Si 06/24/2011, 2:43 PM

## 2011-06-24 NOTE — Progress Notes (Signed)
[facility]  Daily Progress Note  Assessment/Planning: POD #1 s/p X-lap, Aortic repair   Stable postop course  Awaiting rtn of bowel function  NGT if pt vomits  Subjective  - 1 Day Post-Op  Pain tolerable  Objective Filed Vitals:   06/24/11 0400 06/24/11 0500 06/24/11 0600 06/24/11 0700  BP: 145/66 121/53 124/52 124/62  Pulse: 65 70 71 80  Temp: 97.6 F (36.4 C)     TempSrc: Oral     Resp: 18 17 18 18   Height:      Weight:      SpO2: 96% 95% 95% 97%    Intake/Output Summary (Last 24 hours) at 06/24/11 0752 Last data filed at 06/24/11 0600  Gross per 24 hour  Intake   7103 ml  Output   2428 ml  Net   4675 ml    PULM  B rales CV  RRR GI  soft, hypo BS, approp. TTP, -G/R, inc bdg VASC  B feet: palpable faint DP  Laboratory CBC    Component Value Date/Time   WBC 14.1* 06/24/2011 0443   HGB 11.8* 06/24/2011 0443   HCT 34.4* 06/24/2011 0443   PLT 97* 06/24/2011 0443    BMET    Component Value Date/Time   NA 138 06/24/2011 0443   K 4.8 06/24/2011 0443   CL 110 06/24/2011 0443   CO2 19 06/24/2011 0443   GLUCOSE 150* 06/24/2011 0443   GLUCOSE 100* 01/30/2006 1630   BUN 27* 06/24/2011 0443   CREATININE 1.66* 06/24/2011 0443   CALCIUM 7.7* 06/24/2011 0443   CALCIUM 9.5 08/18/2006 0105   GFRNONAA 28* 06/24/2011 0443   GFRAA 32* 06/24/2011 0443    Leonides Sake, MD [facility] Office: [phone] Pager: [phone]  06/24/2011, 7:52 AM

## 2011-06-25 ENCOUNTER — Inpatient Hospital Stay (HOSPITAL_COMMUNITY): Payer: Medicare Other

## 2011-06-25 ENCOUNTER — Other Ambulatory Visit: Payer: Self-pay | Admitting: Internal Medicine

## 2011-06-25 DIAGNOSIS — R410 Disorientation, unspecified: Secondary | ICD-10-CM

## 2011-06-25 LAB — BASIC METABOLIC PANEL
Calcium: 7.8 mg/dL — ABNORMAL LOW (ref 8.4–10.5)
GFR calc non Af Amer: 21 mL/min — ABNORMAL LOW (ref >90)
Glucose, Bld: 128 mg/dL — ABNORMAL HIGH (ref 70–99)
Sodium: 139 mEq/L (ref 135–145)

## 2011-06-25 LAB — CBC
Hemoglobin: 10.1 g/dL — ABNORMAL LOW (ref 12.0–15.0)
MCH: 29.4 pg (ref 26.0–34.0)
MCHC: 33.6 g/dL (ref 30.0–36.0)

## 2011-06-25 MED ORDER — METOPROLOL TARTRATE 1 MG/ML IV SOLN
INTRAVENOUS | Status: AC
  Administered 2011-06-25: 5 mg via INTRAVENOUS
  Filled 2011-06-25: qty 5

## 2011-06-25 MED ORDER — METOPROLOL TARTRATE 1 MG/ML IV SOLN
5.0000 mg | INTRAVENOUS | Status: AC | PRN
  Administered 2011-06-25 (×2): 5 mg via INTRAVENOUS
  Filled 2011-06-25: qty 5

## 2011-06-25 MED ORDER — METHYLDOPA 250 MG PO TABS
250.0000 mg | ORAL_TABLET | Freq: Two times a day (BID) | ORAL | Status: DC
  Administered 2011-06-25 – 2011-07-02 (×13): 250 mg via ORAL
  Filled 2011-06-25 (×15): qty 1

## 2011-06-25 MED ORDER — METOPROLOL TARTRATE 1 MG/ML IV SOLN: 10.0000 mg | Freq: Once | INTRAVENOUS | Status: DC

## 2011-06-25 MED ORDER — RISPERIDONE 0.5 MG PO TABS
0.5000 mg | ORAL_TABLET | Freq: Every day | ORAL | Status: DC
  Administered 2011-06-25 – 2011-07-01 (×6): 0.5 mg via ORAL
  Filled 2011-06-25 (×8): qty 1

## 2011-06-25 MED FILL — Sodium Chloride Irrigation Soln 0.9%: Qty: 3000 | Status: AC

## 2011-06-25 MED FILL — Heparin Sodium (Porcine) Inj 1000 Unit/ML: INTRAMUSCULAR | Qty: 30 | Status: AC

## 2011-06-25 MED FILL — Sodium Chloride IV Soln 0.9%: INTRAVENOUS | Qty: 1000 | Status: AC

## 2011-06-25 NOTE — Progress Notes (Addendum)
[facility]  Post-op  Intra-abdominal Surgery note  Date of Surgery: 06/23/2011 Surgeon: Surgeon(s): Barnett Abu, MD Fransisco Hertz, MD Chuck Hint, MD POD: 2 Days Post-Op Procedure(s): POSTERIOR LUMBAR FUSION 1 WITH HARDWARE REMOVAL EXPLORATORY LAPAROTOMY ABDOMINAL EXPOSURE - Aortic repair  History of Present Illness  Jean [name] is a 78 y.o. female who is 2 days S/P open abdominal aortic repair. Pt is doing well. complains of incisional pain; denies nausea/vomiting; denies diarrhea. has not had flatus;has not had BM  ISignificant Diagnostic Studies: CBC Lab Results  Component Value Date   WBC 15.1* 06/25/2011   HGB 10.1* 06/25/2011   HCT 30.1* 06/25/2011   MCV 87.5 06/25/2011   PLT 98* 06/25/2011    BMET    Component Value Date/Time   NA 139 06/25/2011 0450   K 4.5 06/25/2011 0450   CL 111 06/25/2011 0450   CO2 19 06/25/2011 0450   GLUCOSE 128* 06/25/2011 0450   GLUCOSE 100* 01/30/2006 1630   BUN 35* 06/25/2011 0450   CREATININE 2.08* 06/25/2011 0450   CALCIUM 7.8* 06/25/2011 0450   CALCIUM 9.5 08/18/2006 0105   GFRNONAA 21* 06/25/2011 0450   GFRAA 25* 06/25/2011 0450    COAG Lab Results  Component Value Date   INR 1.29 06/23/2011   INR 1.0 ratio 11/15/2009   INR 1.0 ratio 09/24/2009   No results found for this basename: PTT    I/O last 3 completed shifts: In: 3793 [P.O.:90; I.V.:3203; IV Piggyback:500] Out: 583 [Urine:583]    Physical Examination BP Readings from Last 3 Encounters:  06/25/11 155/73  06/25/11 155/73  06/25/11 155/73   Temp Readings from Last 3 Encounters:  06/25/11 97.8 F (36.6 C) Oral  06/25/11 97.8 F (36.6 C) Oral  06/25/11 97.8 F (36.6 C) Oral   SpO2 Readings from Last 3 Encounters:  06/25/11 95%  06/25/11 95%  06/25/11 95%   Pulse Readings from Last 3 Encounters:  06/25/11 115  06/25/11 115  06/25/11 115    General: A&O x 3, WDWN female in NAD Pulmonary: normal non-labored breathing  ,  Abdomen:abdomen soft, non-tender and few BS  Abdominal wound:clean, dry, intact  Neurologic: A&O X 3; Appropriate Affect ; SENSATION: normal; MOTOR FUNCTION:  moving all extremities equally. Speech is fluent/normal  Vascular Exam:BLE warm and well perfused Extremities without ischemic changes, no Gangrene , no cellulitis; no open wounds;   LOWER EXT pulses   RIGHT                              Left      POSTERIOR TIBIAL doppler doppler       DORSALIS PEDIS      ANTERIOR TIBIAL doppler doppler    Assessment/Plan: Jean [name] is a 78 y.o. female who is 2 Days Post-Op Procedure(s): POSTERIOR LUMBAR FUSION 1 WITH HARDWARE REMOVAL EXPLORATORY LAPAROTOMY ABDOMINAL EXPOSURE U/O improved- cont to monitor ABD incision healing well Abdomen quiet - post -op ilieus as expected Cont NPO until passing flatus or BM NGT if pt has Nausea/vomiting Activity per Wendi Snipes [phone] 06/25/2011 8:32 AM  Addendum  I have independently interviewed and examined the patient, and I agree with the physician assistant's findings.  UOP improved last few hours and urine looks clean with normal hue despite increasing Cr.  Suspect her oliguria related to transient hypotension and we're seeing a delay renal response.  Would continue with my previous recommendations.  Awaiting return of bowel function.  Leonides Sake, MD [facility] Office: [phone] Pager: [phone]  06/25/2011, 8:53 AM

## 2011-06-25 NOTE — Progress Notes (Signed)
Physical Therapy Treatment Patient Details Name: Jean [name] MRN: [id number] DOB: 1928-05-19 Today's Date: 06/25/2011 Time: 4098-1191 PT Time Calculation (min): 36 min  PT Assessment / Plan / Recommendation Comments on Treatment Session  Pt is 78 yo female s/p back surgery and aorta repair.  Today it was noted that in addition to the cognitive changes that she has had, she avoids right gaze, leans to the left in sitting, has instability of the right knee; have concerns of right neglect.  Pt continues to have difficulty with mvmt and is progressing very slowly with in and out of bed mobility.  Pt will continue to follow.    Follow Up Recommendations  Inpatient Rehab    Equipment Recommendations  Defer to next venue    Frequency Min 5X/week   Plan Discharge plan remains appropriate;Frequency remains appropriate    Precautions / Restrictions Precautions Precautions: Back Precaution Booklet Issued: No Precaution Comments: reviewed precautions with pt, she could not recall any Required Braces or Orthoses: Spinal Brace Spinal Brace: Applied in sitting position;Lumbar corset Restrictions Weight Bearing Restrictions: No   Pertinent Vitals/Pain Faces: 6/10 pain abdomen and back    Mobility  Bed Mobility Rolling Right: 1: +2 Total assist Rolling Right: Patient Percentage: 10% Right Sidelying to Sit: 1: +2 Total assist;HOB flat Right Sidelying to Sit: Patient Percentage: 10% Details for Bed Mobility Assistance: pt assisted with bending knees to roll but otherwise resisting mvmt due to pain so verbally compliant but not significantly helping with the mvmt Transfers Transfers: Sit to Stand;Stand to Sit;Stand Pivot Transfers Sit to Stand: 1: +2 Total assist;From bed Sit to Stand: Patient Percentage: 20% Stand to Sit: 1: +2 Total assist;To bed;To chair/3-in-1 Stand to Sit: Patient Percentage: 20% Stand Pivot Transfers: 1: +2 Total assist Stand Pivot Transfers: Patient Percentage:  20% Details for Transfer Assistance: pt stood 2x from bed with maximal facilitation for anterior translation and then lifting of chest/ posterior rotation of pelvis.  Pt with instability of right knee>left, unable to bear full wt on RLE to step with left foot.  Maximal facillitation for wt-shifting. Ambulation/Gait Ambulation/Gait Assistance: Not tested (comment) (unable) Stairs: No Wheelchair Mobility Wheelchair Mobility: No         PT Goals Acute Rehab PT Goals PT Goal Formulation: With patient/family Time For Goal Achievement: 07/08/11 Potential to Achieve Goals: Fair Pt will Roll Supine to Right Side: with min assist PT Goal: Rolling Supine to Right Side - Progress: Not progressing Pt will go Supine/Side to Sit: with min assist PT Goal: Supine/Side to Sit - Progress: Not progressing Pt will go Sit to Supine/Side: with min assist PT Goal: Sit to Supine/Side - Progress: Other (comment) (NT today) Pt will go Sit to Stand: with mod assist PT Goal: Sit to Stand - Progress: Progressing toward goal (slowly) Pt will go Stand to Sit: with mod assist PT Goal: Stand to Sit - Progress: Progressing toward goal Pt will Transfer Bed to Chair/Chair to Bed: with mod assist PT Transfer Goal: Bed to Chair/Chair to Bed - Progress: Progressing toward goal (slowly) Pt will Ambulate: 16 - 50 feet;with min assist;with least restrictive assistive device PT Goal: Ambulate - Progress: Not progressing Additional Goals Additional Goal #1: Pt will independently state and demonstrate awareness of 3/3 back precautions PT Goal: Additional Goal #1 - Progress: Not progressing  Visit Information  Last PT Received On: 06/25/11 Assistance Needed: +2    Subjective Data  Subjective: "I don't think I can do anymore" Patient Stated Goal: pt  agreeable to getting out of bed but does not feel that she can do it   Cognition  Overall Cognitive Status: Impaired Area of Impairment: Memory;Attention Arousal/Alertness:  Lethargic Behavior During Session: Lethargic Current Attention Level: Focused Attention - Other Comments: very slow to process and respond Memory: Decreased recall of precautions    Balance  Balance Balance Assessed: Yes Static Sitting Balance Static Sitting - Balance Support: Bilateral upper extremity supported;Feet supported Static Sitting - Level of Assistance: 3: Mod assist Static Sitting - Comment/# of Minutes: 10  End of Session PT - End of Session Equipment Utilized During Treatment: Gait belt;Back brace Activity Tolerance: Patient limited by fatigue Patient left: in chair;with call bell/phone within reach;with family/visitor present Nurse Communication: Mobility status;Other (comment) (concerns about neurologic status)  Lyanne Co, PT  Acute Rehab Services  [phone]   Lyanne Co 06/25/2011, 11:40 AM

## 2011-06-25 NOTE — Progress Notes (Signed)
Name: Jean [name] MRN: [id number] DOB: 08-Nov-1928    LOS: 2  PCCM PROGRESS NOTE  Active Problems:  Status post lumbar surgery  Aortic injury, S/P vascular surgery  Delirium, acute  ARF (acute renal failure)  HYPOTHYROIDISM, UNSPECIFIED  HYPERTENSION  CORONARY ATHEROSCLEROSIS NATIVE CORONARY ARTERY   History of Present Illness: 78 y/o female admitted on 06/23/2011 for an elective L2-L3 lumbar surgery complicated by aortic injury requiring laparotomy and repair by Vascular surgery. During the procedure she lost an estimated 1800cc of blood and received 3400cc of LR, 1000cc NS, 500cc hetastarch, 600cc of Cell Saver, and 2 U PRBC.  Lines / Drains: Peripheral IV  Cultures: None  Antibiotics: Vanc 4/22 (perioperative vascular) x1  Tests / Events:  4/22 (Dr. Danielle Dess) Bilateral laminotomies and foraminotomies decompression L2-L3 with decompression of L2 and L3 nerve roots greater dissection then for simple interbody arthrodesis. Posterior interbody arthrodesis using peek spacers with autograft and allograft. Removal of previously placed hardware L3-L5, pedicle screw fixation L2-L3 with posterior lateral arthrodesis using allograft and autograft   4/22 (Dr. Imogene Burn) Exploratory celiotomy and repair of aorta  4/23 patient's UOP increased to 75 cc/hour 4/23 started metoprolol 100 mg bid due to elevated Bp.  Vital Signs: Temp:  [97.4 F (36.3 C)-98 F (36.7 C)] 97.7 F (36.5 C) (04/24 0400) Pulse Rate:  [76-115] 115  (04/24 0400) Resp:  [12-27] 18  (04/24 0600) BP: (89-157)/(44-76) 155/73 mmHg (04/24 0600) SpO2:  [91 %-98 %] 95 % (04/24 0400) Arterial Line BP: (60-179)/(49-109) 121/109 mmHg (04/24 0600) I/O last 3 completed shifts: In: 3793 [P.O.:90; I.V.:3203; IV Piggyback:500] Out: 583 [Urine:583]  Physical Examination:  general: resting in bed, not in acute distress HEENT: PERRL, EOMI, no scleral icterus Cardiac: S1/S2, RRR, No murmurs, gallops or rubs Pulm: slightly  decreased air movement on right side,  Clear to auscultation bilaterally, No rales, wheezing, rhonchi or rubs. Abd: Soft,  nondistended, has tenderness over the surgical site, no organomegaly, BS present Ext: No rashes or edema, 2+DP/PT pulse bilaterally. Has back pain Neuro: alert and oriented X3, cranial nerves II-XII grossly intact, muscle strength 5/5 in all extremeties,  sensation to light touch intact.   Ventilator settings: not on ventilator   Labs and Imaging:  Reviewed.  Please refer to the Assessment and Plan section for relevant results.  ASSESSMENT AND PLAN  NEUROLOGIC A:  Status post L2-L3 decompression. Fair pain control. Acute delirium. Has history of CVA without motor deficit.  Noted R side neglect and R side weakness during PT/OT treatment - likely old deficit being more pronounced under physiological stress. P: -->  hold home ASA and plavix -->  continue mecline for dizziness -->  Fentanyl 25-50 q2h PRN -->  Follow neurosurgery recommendations -->  Head CT r/o acute CVA -->  Start Risperdal 0.5 mg PO qHS -->  D/c Valium  PULMONARY  A:  Atelectasis due to pain. P: -->  Incentive spirometry  CARDIOVASCULAR  Lab 06/23/11 2250  TROPONINI <0.30  LATICACIDVEN --  PROBNP --   A:  History of CAD and MI s/p stent. Intraoperative aortic injury.  Hypertension. P: -->  Hold ASA and plavix for now -->  Metoprolol per home regimen -->  Restart Methyldopa per home regimen -->  Follow vascular recommendations  RENAL  Lab 06/25/11 0450 06/24/11 0443 06/23/11 2250 06/23/11 1427 06/23/11 1352 06/20/11 1154  NA 139 138 137 139 140 --  K 4.5 4.8 -- -- -- --  CL 111 110 110 -- -- 104  CO2 19 19 19  -- -- 27  BUN 35* 27* 25* -- -- 24*  CREATININE 2.08* 1.66* 1.42* -- -- 1.36*  CALCIUM 7.8* 7.7* 7.2* -- -- 9.9  MG -- -- -- -- -- --  PHOS -- -- -- -- -- --   A:  Acute on chronic renal failure, likely prerenal vs transient hypoperfusion. P: -->  IVF NS 100 mL/h -->   BMP in AM  GASTROINTESTINAL  A:  No acute issue P:  Protonix for GI Px  HEMATOLOGIC  Lab 06/25/11 0450 06/24/11 0443 06/23/11 2250 06/23/11 1427 06/23/11 1352 06/20/11 1154  HGB 10.1* 11.8* 11.2* 11.6* 10.9* --  HCT 30.1* 34.4* 33.2* 34.0* 32.0* --  PLT 98* 97* 93* -- -- 165  INR -- -- 1.29 -- -- --  APTT -- -- -- -- -- --   A:   Anemia due to blood blood, essentially stable.  P: -->  CBC in AM  INFECTIOUS  Lab 06/25/11 0450 06/24/11 0443 06/23/11 2250 06/20/11 1154  WBC 15.1* 14.1* 13.4* 6.4  PROCALCITON -- -- -- --   A:  No signs of infection, leukocytosis is likely due to stress. P: -->  CBC in AM  ENDOCRINE  Lab 06/23/11 2147  GLUCAP 148*   A:  No DM. H/o hypothymism.  P: -->  Continue synthroid -->  Check TSH  BEST PRACTICE / DISPOSITION -->  ICU status under Neurosurgery -->  Vascular / PCCM consulting -->  Full code -->  Sips of fluid -->  DVT Px: SCD -->  Protonix IV for GI Px -->  Family updated  Lorretta Harp, MD PGY1, Internal Medicine Teaching Service Pager: [phone] 06/25/2011, 7:21 AM  Patient examined.  Records reviewed.  Assessment and plan above is edited and discussed with ICU Resident Team.  Orlean Bradford, M.D., F.C.C.P. Pulmonary and Critical Care Medicine [facility] Cell: [phone] Pager: [phone]

## 2011-06-26 LAB — BASIC METABOLIC PANEL
BUN: 30 mg/dL — ABNORMAL HIGH (ref 6–23)
Chloride: 110 mEq/L (ref 96–112)
Glucose, Bld: 94 mg/dL (ref 70–99)

## 2011-06-26 LAB — CBC
MCH: 29.7 pg (ref 26.0–34.0)
MCHC: 34 g/dL (ref 30.0–36.0)
MCV: 87.5 fL (ref 78.0–100.0)
Platelets: 76 10*3/uL — ABNORMAL LOW (ref 150–400)
RDW: 14.4 % (ref 11.5–15.5)
WBC: 10 10*3/uL (ref 4.0–10.5)

## 2011-06-26 LAB — HEMOGLOBIN AND HEMATOCRIT, BLOOD: Hemoglobin: 8.9 g/dL — ABNORMAL LOW (ref 12.0–15.0)

## 2011-06-26 MED ORDER — MAGNESIUM OXIDE 400 MG PO TABS
800.0000 mg | ORAL_TABLET | Freq: Once | ORAL | Status: AC
  Administered 2011-06-26: 800 mg via ORAL
  Filled 2011-06-26: qty 2

## 2011-06-26 MED ORDER — METOPROLOL TARTRATE 50 MG PO TABS
150.0000 mg | ORAL_TABLET | Freq: Two times a day (BID) | ORAL | Status: DC
  Administered 2011-06-26 – 2011-07-02 (×11): 150 mg via ORAL
  Filled 2011-06-26 (×13): qty 1

## 2011-06-26 MED ORDER — BISACODYL 10 MG RE SUPP: 10.0000 mg | Freq: Every day | RECTAL | Status: DC | PRN

## 2011-06-26 NOTE — Progress Notes (Signed)
CSW received consult for SNF. PT recommending CIR and CIR will assess pt for appriopateness. CSW assess pt for SNF if it is recommended by CIR. Please call with any urgent needs. CSW will continue to follow.   Dede Query, MSW, [facility] [phone]

## 2011-06-26 NOTE — Progress Notes (Addendum)
[facility] [place]  Post-op  Intra-abdominal Surgery note  Date of Surgery: 06/23/2011 Surgeon: Surgeon(s): Barnett Abu, MD Fransisco Hertz, MD Chuck Hint, MD POD: 3 Days Post-Op Procedure(s): POSTERIOR LUMBAR FUSION 1 WITH HARDWARE REMOVAL EXPLORATORY LAPAROTOMY ABDOMINAL EXPOSURE  History of Present Illness  Jean [name] is a 78 y.o. female who is doing well s/p open aorta repair. denies incisional pain; denies nausea/vomiting; denies diarrhea. has not had flatus;has not had BM. Not hungry yet.  IMAGING: Ct Head Wo Contrast  06/25/2011  *RADIOLOGY REPORT*  Clinical Data: Altered mental status with flat affect since lumbar spine fusion surgery on 06/23/2011 complicated by avulsion of lumbar artery requiring anterior repair. New right-sided neglect or hemiparesis.  CT HEAD WITHOUT CONTRAST  Technique:  Contiguous axial images were obtained from the base of the skull through the vertex without contrast.  Comparison: Most recent head CT 10/03/2004.  Findings: There is no evidence for acute infarction, intracranial hemorrhage, mass lesion, hydrocephalus, or extra-axial fluid.  Mild to moderate age related atrophy is present.  There is moderately extensive chronic microvascular ischemic change.  Left basal ganglia/periventricular white matter lacune appears chronic, although has occurred since 2006. There has been generalized progression of small vessel disease since 2006.  No definite acute cortically based infarct although MRI could be helpful in further evaluation.  Moderate cerebellar atrophy.  Calvarium intact.  No acute sinus or mastoid disease.  Moderate vascular calcification carotid siphons.  IMPRESSION: Progression of atrophy and small vessel disease is since 2006.  No definite acute  infarct or hemorrhage.  MRI could be helpful in further evaluation.  Original Report Authenticated By: Elsie Stain, M.D.    Significant Diagnostic Studies: CBC Lab  Results  Component Value Date   WBC 10.0 06/26/2011   HGB 8.8* 06/26/2011   HCT 25.9* 06/26/2011   MCV 87.5 06/26/2011   PLT 76* 06/26/2011    BMET    Component Value Date/Time   NA 137 06/26/2011 0410   K 4.1 06/26/2011 0410   CL 110 06/26/2011 0410   CO2 18* 06/26/2011 0410   GLUCOSE 94 06/26/2011 0410   GLUCOSE 100* 01/30/2006 1630   BUN 30* 06/26/2011 0410   CREATININE 1.66* 06/26/2011 0410   CALCIUM 8.1* 06/26/2011 0410   CALCIUM 9.5 08/18/2006 0105   GFRNONAA 28* 06/26/2011 0410   GFRAA 32* 06/26/2011 0410    COAG Lab Results  Component Value Date   INR 1.29 06/23/2011   INR 1.0 ratio 11/15/2009   INR 1.0 ratio 09/24/2009   No results found for this basename: PTT    I/O last 3 completed shifts: In: 3500 [I.V.:3500] Out: 2120 [Urine:2120]    Physical Examination BP Readings from Last 3 Encounters:  06/26/11 136/61  06/26/11 136/61  06/26/11 136/61   Temp Readings from Last 3 Encounters:  06/26/11 98.1 F (36.7 C) Oral  06/26/11 98.1 F (36.7 C) Oral  06/26/11 98.1 F (36.7 C) Oral   SpO2 Readings from Last 3 Encounters:  06/26/11 64%  06/26/11 64%  06/26/11 64%   Pulse Readings from Last 3 Encounters:  06/26/11 93  06/26/11 93  06/26/11 93    General: A&O x 3, WDWN female in NAD Pulmonary: normal non-labored breathing Cardiac: Heart rate : regular , ST Abdomen:abdomen soft, non-tender and normal active bowel sounds Abdominal wound:clean, dry, intact  Neurologic: awake alert; flat Affect ;  SENSATION: normal; MOTOR FUNCTION:  moving all extremities equally. Speech is fluent/normal  Vascular Exam:BLE  warm and well perfused Extremities without ischemic changes, no Gangrene , no cellulitis; no open wounds;   Assessment/Plan: Jean [name] is a 78 y.o. female who is 3 Days Post-Op Procedure(s): EXPLORATORY LAPAROTOMY ABDOMINAL EXPOSURE with aortic repair Pt is doing well Bowel function returning Post-op acute blood loss anemia - transfuse per Dr.  Danielle Dess Begin clear liquids DC A-line      [name] [phone] 06/26/2011 7:42 AM   Addendum  I have independently interviewed and examined the patient, and I agree with the physician assistant's findings.  Pt is not behaving as an acute bleed from the aorta.  Abdomen is benign and heart rate was normal when I saw the patient.  I would repeat the H/H first.  Kidney function recovering as expected.  Leonides Sake, MD [facility] [place] Office: [phone] Pager: [phone]  06/26/2011, 10:03 AM

## 2011-06-26 NOTE — Progress Notes (Signed)
Name: STEVE GREGG MRN: [id number] DOB: 15-Dec-1928    LOS: 3  PCCM PROGRESS NOTE  Active Problems:  Status post lumbar surgery  Aortic injury, S/P vascular surgery  Delirium, acute  ARF (acute renal failure)  HYPOTHYROIDISM, UNSPECIFIED  HYPERTENSION  CORONARY ATHEROSCLEROSIS NATIVE CORONARY ARTERY   History of Present Illness: 78 y/o female admitted on 06/23/2011 for an elective L2-L3 lumbar surgery complicated by aortic injury requiring laparotomy and repair by Vascular surgery. During the procedure she lost an estimated 1800cc of blood and received 3400cc of LR, 1000cc NS, 500cc hetastarch, 600cc of Cell Saver, and 2 U PRBC.  Lines / Drains: Peripheral IV  Cultures: None  Antibiotics: Vanc 4/22 (perioperative vascular) x1  Tests / Events:  4/22 (Dr. Danielle Dess) Bilateral laminotomies and foraminotomies decompression L2-L3 with decompression of L2 and L3 nerve roots greater dissection then for simple interbody arthrodesis. Posterior interbody arthrodesis using peek spacers with autograft and allograft. Removal of previously placed hardware L3-L5, pedicle screw fixation L2-L3 with posterior lateral arthrodesis using allograft and autograft   4/22 (Dr. Imogene Burn) Exploratory celiotomy and repair of aorta  4/23 patient's UOP increased to 75 cc/hour 4/23 started metoprolol 100 mg bid due to elevated Bp. 4/24 started methyldopa 250 mg bid due to elevated Bp. 4/24 Ct-head: no acute intracranial abnormalities  Vital Signs: Temp:  [97.8 F (36.6 C)-99 F (37.2 C)] 98.1 F (36.7 C) (04/25 0407) Pulse Rate:  [67-113] 93  (04/25 0600) Resp:  [14-24] 20  (04/25 0600) BP: (131-165)/(51-80) 136/61 mmHg (04/25 0500) SpO2:  [64 %-97 %] 64 % (04/25 0600) Arterial Line BP: (133-186)/(54-77) 159/54 mmHg (04/25 0600) I/O last 3 completed shifts: In: 3500 [I.V.:3500] Out: 2120 [Urine:2120]  Physical Examination:  general: resting in bed, not in acute distress HEENT: PERRL, EOMI, no scleral  icterus Cardiac: S1/S2, RRR, No murmurs, gallops or rubs Pulm: slightly decreased air movement on right side,  Clear to auscultation bilaterally, No rales, wheezing, rhonchi or rubs. Abd: Soft,  nondistended, has tenderness over the surgical site, no organomegaly, BS present Ext: No rashes or edema, 2+DP/PT pulse bilaterally. Has back pain Neuro: alert and oriented to time (year), cranial nerves II-XII grossly intact, muscle strength 5/5 in all extremeties,  sensation to light touch intact.   Ventilator settings: not on ventilator   Labs and Imaging:  Reviewed.  Please refer to the Assessment and Plan section for relevant results.  ASSESSMENT AND PLAN  NEUROLOGIC A:  Status post L2-L3 decompression. Fair pain control. Acute delirium - improving. Has history of CVA without motor deficit.  Negative head CT this admission. P: -->  Hold home ASA and plavix -->  Mecline PRN dizziness as preadmission -->  Fentanyl 25-50 q2h PRN pain -->  Follow neurosurgery recommendations -->  Continue Risperdal 0.5 mg PO qHS  PULMONARY  A:  Atelectasis due to pain. P: -->  Incentive spirometry  CARDIOVASCULAR  Lab 06/23/11 2250  TROPONINI <0.30  LATICACIDVEN --  PROBNP --   A:  History of CAD and MI s/p stent. Intraoperative aortic injury.  Hypertension.  Short run of SVT this AM, self limited, likely secondary to decrease dose of beta-blocker P: -->  Hold ASA and plavix for now -->  Metoprolol per home regimen (150 mg PO bid) -->  Methyldopa per home regimen -->  Follow vascular recommendations -->  Check Mg  RENAL  Lab 06/26/11 0410 06/25/11 0450 06/24/11 0443 06/23/11 2250 06/23/11 1427 06/20/11 1154  NA 137 139 138 137 139 --  K  4.1 4.5 -- -- -- --  CL 110 111 110 110 -- 104  CO2 18* 19 19 19  -- 27  BUN 30* 35* 27* 25* -- 24*  CREATININE 1.66* 2.08* 1.66* 1.42* -- 1.36*  CALCIUM 8.1* 7.8* 7.7* 7.2* -- 9.9  MG -- -- -- -- -- --  PHOS -- -- -- -- -- --   A:  Acute on chronic  renal failure, likely prerenal vs transient hypoperfusion, improving. P: -->  IVF NS 100 mL/h -->  BMP in AM  GASTROINTESTINAL  A:  No acute issue P:  Protonix for GI Px  HEMATOLOGIC  Lab 06/26/11 0410 06/25/11 0450 06/24/11 0443 06/23/11 2250 06/23/11 1427 06/20/11 1154  HGB 8.8* 10.1* 11.8* 11.2* 11.6* --  HCT 25.9* 30.1* 34.4* 33.2* 34.0* --  PLT 76* 98* 97* 93* -- 165  INR -- -- -- 1.29 -- --  APTT -- -- -- -- -- --    A:   Anemia and thrombocytopenia likely due to blood loss and dilution.  P: -->  CBC q12h  INFECTIOUS  Lab 06/26/11 0410 06/25/11 0450 06/24/11 0443 06/23/11 2250 06/20/11 1154  WBC 10.0 15.1* 14.1* 13.4* 6.4  PROCALCITON -- -- -- -- --   A:  No signs of infection, leukocytosis is likely due to stress, resolved P: -->  CBC as above  ENDOCRINE  Lab 06/23/11 2147  GLUCAP 148*  TSH 2.67.  A:  No DM. H/o hypothymism.  P: -->  Continue synthroid  BEST PRACTICE / DISPOSITION -->  ICU status under Neurosurgery -->  Vascular / PCCM consulting -->  Full code -->  Diet: Clear fluid -->  DVT Px: SCD -->  Protonix IV for GI Px -->  Family updated  Lorretta Harp, MD PGY1, Internal Medicine Teaching Service Pager: [phone] 06/26/2011, 7:25 AM  Patient examined.  Records reviewed.  Assessment and plan above is edited and discussed with ICU Resident Team.  Orlean Bradford, M.D., F.C.C.P. Pulmonary and Critical Care Medicine [facility] Cell: [phone] Pager: [phone]

## 2011-06-26 NOTE — Progress Notes (Signed)
Physical Therapy Cancellation Note: attempted to see pt but per RN pt with frequent bursts of SVT and RN awaiting CCM rounding to address. Will attempt in PM as time allows. Thanks. Delaney Meigs, PT [phone]

## 2011-06-27 ENCOUNTER — Inpatient Hospital Stay (HOSPITAL_COMMUNITY): Payer: Medicare Other

## 2011-06-27 LAB — BASIC METABOLIC PANEL
BUN: 27 mg/dL — ABNORMAL HIGH (ref 6–23)
CO2: 20 mEq/L (ref 19–32)
Calcium: 8.2 mg/dL — ABNORMAL LOW (ref 8.4–10.5)
Chloride: 111 mEq/L (ref 96–112)
Creatinine, Ser: 1.46 mg/dL — ABNORMAL HIGH (ref 0.50–1.10)
GFR calc Af Amer: 38 mL/min — ABNORMAL LOW (ref >90)

## 2011-06-27 LAB — CBC
HCT: 23.8 % — ABNORMAL LOW (ref 36.0–46.0)
HCT: 34.4 % — ABNORMAL LOW (ref 36.0–46.0)
Hemoglobin: 11.8 g/dL — ABNORMAL LOW (ref 12.0–15.0)
MCH: 29.2 pg (ref 26.0–34.0)
MCH: 28.9 pg (ref 26.0–34.0)
MCHC: 34.3 g/dL (ref 30.0–36.0)
MCV: 86.9 fL (ref 78.0–100.0)
Platelets: 85 10*3/uL — ABNORMAL LOW (ref 150–400)
RDW: 14.2 % (ref 11.5–15.5)
RDW: 15.2 % (ref 11.5–15.5)
WBC: 8.1 10*3/uL (ref 4.0–10.5)

## 2011-06-27 LAB — MAGNESIUM: Magnesium: 1.6 mg/dL (ref 1.5–2.5)

## 2011-06-27 MED ORDER — BISACODYL 10 MG RE SUPP: 10.0000 mg | Freq: Every day | RECTAL | Status: DC | PRN

## 2011-06-27 MED ORDER — FUROSEMIDE 10 MG/ML IJ SOLN
20.0000 mg | Freq: Once | INTRAMUSCULAR | Status: AC
  Administered 2011-06-27: 20 mg via INTRAVENOUS
  Filled 2011-06-27: qty 2

## 2011-06-27 MED ORDER — METHYLDOPATE HCL 250 MG/5ML IV SOLN
250.0000 mg | Freq: Once | INTRAVENOUS | Status: DC
  Filled 2011-06-27: qty 5

## 2011-06-27 MED ORDER — FUROSEMIDE 10 MG/ML IJ SOLN: 40.0000 mg | Freq: Once | INTRAMUSCULAR | Status: DC

## 2011-06-27 MED ORDER — METOPROLOL TARTRATE 1 MG/ML IV SOLN
5.0000 mg | Freq: Four times a day (QID) | INTRAVENOUS | Status: DC
  Administered 2011-06-28 (×2): 5 mg via INTRAVENOUS
  Filled 2011-06-27 (×6): qty 5

## 2011-06-27 NOTE — Progress Notes (Addendum)
[facility] [place]  Daily Progress Note  Assessment/Planning: POD #4 s/p X-lap, Aortic repair for aortic injury during lumbar fusion   Clinically pt presents without any evidence of aortic bleeding, some element of decreasing H/H is likely fluid overload  Pt has a significant + fluid balance: +9.3 L since admission to ICU, so I would consider diuresis as 3rd space reabsorption should already be occurring since POD #3 but pt continues to +  AM labs are not available  If HCT < 30% consider transfusion given CAD history  Awaiting full return of bowel function before advancing diet  I would consider a suppository to provide some additional stimulation given active bowel sounds can be heard  Dr. Hart Rochester will be covering for me this weekend  Subjective  - 4 Days Post-Op  No abd pain  Objective Filed Vitals:   06/27/11 0200 06/27/11 0300 06/27/11 0400 06/27/11 0500  BP: 107/60 108/43 113/49 123/51  Pulse: 65 61 99 65  Temp:      TempSrc:      Resp: 18 17 17 19   Height:      Weight:      SpO2: 100% 100% 100% 100%    Intake/Output Summary (Last 24 hours) at 06/27/11 0555 Last data filed at 06/27/11 0500  Gross per 24 hour  Intake   2630 ml  Output    947 ml  Net   1683 ml    PULM  CTAB CV  RRR GI  soft, NTND, inc c/d/i, staples in place VASC  B feet warm and viable  Laboratory CBC    Component Value Date/Time   WBC 10.0 06/26/2011 0410   HGB 8.9* 06/26/2011 1418   HCT 26.7* 06/26/2011 1418   PLT 76* 06/26/2011 0410    BMET    Component Value Date/Time   NA 137 06/26/2011 0410   K 4.1 06/26/2011 0410   CL 110 06/26/2011 0410   CO2 18* 06/26/2011 0410   GLUCOSE 94 06/26/2011 0410   GLUCOSE 100* 01/30/2006 1630   BUN 30* 06/26/2011 0410   CREATININE 1.66* 06/26/2011 0410   CALCIUM 8.1* 06/26/2011 0410   CALCIUM 9.5 08/18/2006 0105   GFRNONAA 28* 06/26/2011 0410   GFRAA 32* 06/26/2011 0410    Leonides Sake, MD [facility]  [place] Office: [phone] Pager: [phone]  06/27/2011, 5:55 AM   Addendum  H/H continues to trend down.  I suspect dilute rather than bleeding.  Given known history of CAD, will transfuse 2 units pRBC and give Lasix after each unit.  CBC    Component Value Date/Time   WBC 8.1 06/27/2011 0610   RBC 2.74* 06/27/2011 0610   HGB 8.0* 06/27/2011 0610   HCT 23.8* 06/27/2011 0610   PLT 85* 06/27/2011 0610   MCV 86.9 06/27/2011 0610   MCH 29.2 06/27/2011 0610   MCHC 33.6 06/27/2011 0610   RDW 14.2 06/27/2011 0610   LYMPHSABS 1.4 11/15/2009 0907   MONOABS 0.6 11/15/2009 0907   EOSABS 0.2 11/15/2009 0907   BASOSABS 0.0 11/15/2009 2956      Leonides Sake, MD [facility]: [phone] Pager: [phone]  06/27/2011, 8:46 AM

## 2011-06-27 NOTE — Progress Notes (Signed)
Physical Therapy Treatment Patient Details Name: Jean [name] MRN: [id number] DOB: [birth year] Today's Date: 06/27/2011 Time: 4098-1191 PT Time Calculation (min): 26 min  PT Assessment / Plan / Recommendation Comments on Treatment Session  Pt s/p back surgery and aorta repair with very lethargic status with difficulty responding to any questions or commands and significantly different cognitively and physically from evaluation. Pt demonstrated decreased use and awareness of LUE today but decreased initiation and activation of bil LE. RN aware and present with family and RN educated for back precautions and brace wear. Will follow to progress pt pending medical and cognitive ability.    Follow Up Recommendations       Equipment Recommendations       Frequency     Plan Discharge plan remains appropriate    Precautions / Restrictions Precautions Precautions: Back Required Braces or Orthoses: Spinal Brace Spinal Brace: Lumbar corset;Applied in sitting position   Pertinent Vitals/Pain Pt lethargic and stated back pain with initial roll only    Mobility  Bed Mobility Bed Mobility: Rolling Right;Right Sidelying to Sit;Sit to Sidelying Right;Scooting to [facility];Sitting - Scoot to [place] of Bed Rolling Right: 1: +2 Total assist Rolling Right: Patient Percentage: 10% Right Sidelying to Sit: 1: +2 Total assist;HOB flat Right Sidelying to Sit: Patient Percentage: 10% Sitting - Scoot to Edge of Bed: 1: +1 Total assist Sit to Sidelying Right: 1: +2 Total assist;HOB flat Sit to Sidelying Right: Patient Percentage: 10% Scooting to HOB: 1: +2 Total assist Details for Bed Mobility Assistance: Pt attempted to assist with bending LLE only but unable to maintain. Pt would state at times that she heard command after delay but made no physical effort toward performing task Transfers Transfers: Sit to Stand;Stand to Sit Sit to Stand: From bed;1: +2 Total assist Sit to Stand: Patient Percentage:  10% Stand to Sit: 1: +2 Total assist;To bed Stand to Sit: Patient Percentage: 10% Details for Transfer Assistance: Pt stood x 2 from bed with facilitation at trunk for extension and sacrum for anteior translation along with bil knees blocked. Pt very floppy for all joints and not exhibiting effort to assist with transfer and returned to bed Ambulation/Gait Ambulation/Gait Assistance: Not tested (comment)    Exercises     PT Goals Acute Rehab PT Goals Potential to Achieve Goals: Fair PT Goal: Rolling Supine to Right Side - Progress: Not progressing PT Goal: Supine/Side to Sit - Progress: Not progressing PT Goal: Sit to Supine/Side - Progress: Not progressing PT Goal: Sit to Stand - Progress: Not progressing PT Goal: Stand to Sit - Progress: Not progressing PT Transfer Goal: Bed to Chair/Chair to Bed - Progress: Not progressing PT Goal: Ambulate - Progress: Not progressing Additional Goals PT Goal: Additional Goal #1 - Progress: Not progressing  Visit Information  Last PT Received On: 06/27/11 Assistance Needed: +2    Subjective Data  Subjective: i hear ya   Cognition  Overall Cognitive Status: Impaired Area of Impairment: Attention;Following commands;Memory Arousal/Alertness: Lethargic Orientation Level: Other (comment) (pt would only respond to place and disoriented to it) Behavior During Session: Lethargic Current Attention Level: Focused Attention - Other Comments: extremely slow to respond verbally and motorically Memory: Decreased recall of precautions Memory Deficits: pt unable to recall any precautions or situation Following Commands: Follows one step commands inconsistently    Insurance risk surveyor Sitting - Balance Support: Feet supported;Right upper extremity supported Static Sitting - Level of Assistance: 3: Mod assist Static Sitting - Comment/# of Minutes:  8 min with variation from complete posterior lean to anterior lean  End of Session PT -  End of Session Equipment Utilized During Treatment: Gait belt;Back brace Activity Tolerance: Other (comment) (Pt limited by lethargy, cognition and lack of participation) Patient left: in bed;with call bell/phone within reach;with nursing in room Nurse Communication: Mobility status    Jean [name] 06/27/2011, 9:28 AM Jean [name], PT [phone]

## 2011-06-27 NOTE — Progress Notes (Signed)
Name: Jean [name] MRN: [id number] DOB: 07/14/1928    LOS: 4  PCCM PROGRESS NOTE  Active Problems:  HYPOTHYROIDISM, UNSPECIFIED  HYPERTENSION  CORONARY ATHEROSCLEROSIS NATIVE CORONARY ARTERY  Status post lumbar surgery  Aortic injury, S/P vascular surgery  ARF (acute renal failure)  Delirium, acute   History of Present Illness: 78 y/o female admitted on 06/23/2011 for an elective L2-L3 lumbar surgery complicated by aortic injury requiring laparotomy and repair by Vascular surgery. During the procedure she lost an estimated 1800cc of blood and received 3400cc of LR, 1000cc NS, 500cc hetastarch, 600cc of Cell Saver, and 2 U PRBC.  Lines / Drains: Peripheral IV  Cultures: None  Antibiotics: Vanc 4/22 (perioperative vascular) x1  Tests / Events:  4/22 (Dr. Danielle Dess) Bilateral laminotomies and foraminotomies decompression L2-L3 with decompression of L2 and L3 nerve roots greater dissection then for simple interbody arthrodesis. Posterior interbody arthrodesis using peek spacers with autograft and allograft. Removal of previously placed hardware L3-L5, pedicle screw fixation L2-L3 with posterior lateral arthrodesis using allograft and autograft   4/22 (Dr. Imogene Burn) Exploratory celiotomy and repair of aorta  4/23 patient's UOP increased to 75 cc/hour 4/23 started metoprolol 100 mg bid due to elevated Bp. 4/24 started methyldopa 250 mg bid due to elevated Bp. 4/24 Ct-head: no acute intracranial abnormalities 4/26 started clear liquid diet 4/26 increased metoprolol dose  From 100 mg bid to home dose 150 mg bid.  Vital Signs: Temp:  [97.8 F (36.6 C)-98.5 F (36.9 C)] 97.8 F (36.6 C) (04/26 0000) Pulse Rate:  [61-120] 68  (04/26 0600) Resp:  [16-22] 16  (04/26 0600) BP: (107-152)/(41-72) 129/67 mmHg (04/26 0600) SpO2:  [99 %-100 %] 100 % (04/26 0600) Arterial Line BP: (156-170)/(69-76) 156/69 mmHg (04/25 1000) I/O last 3 completed shifts: In: 3705 [P.O.:730; I.V.:2975] Out: 2007  [Urine:2007]  Physical Examination:  general: resting in bed, not in acute distress HEENT: PERRL, EOMI, no scleral icterus Cardiac: S1/S2, RRR, No murmurs, gallops or rubs Pulm: slightly decreased air movement on right side,  Clear to auscultation bilaterally, No rales, wheezing, rhonchi or rubs. Abd: Soft,  nondistended, has tenderness over the surgical site, no organomegaly, BS present Ext: No rashes or edema, 2+DP/PT pulse bilaterally. Has back pain Neuro: confused and oriented to time (year) only, cranial nerves II-XII grossly intact, muscle strength 5/5 in all extremeties,  sensation to light touch intact.   Ventilator settings: not on ventilator   Labs and Imaging:  Reviewed.  Please refer to the Assessment and Plan section for relevant results.  ASSESSMENT AND PLAN  NEUROLOGIC A:  Status post L2-L3 decompression. Fair pain control. Acute delirium. Has history of CVA without motor deficit.  Negative head CT this admission. P: -->  Hold home ASA and plavix -->  D/c Mecline  -->  Fentanyl 25-50 q2h PRN pain -->  Follow neurosurgery recommendations -->  Continue Risperdal 0.5 mg PO qHS -->  OOBTC -->  NPO / convert Rx to IV  PULMONARY  A:  Atelectasis due to pain. Pulmonary vascular congestion.  Small bilateral pleural effusions. P: --> Incentive spirometry --> IV lasix 40 mg X 1 --> KVO  CARDIOVASCULAR  Lab 06/23/11 2250  TROPONINI <0.30  LATICACIDVEN --  PROBNP --   A:  History of CAD and MI s/p stent. Intraoperative aortic injury.  Hypertension.    P: -->  Hold ASA and plavix for now -->  Metoprolol per home regimen (150 mg PO bid), convert to IV -->  Methyldopa per home regimen -->  Follow vascular recommendations  RENAL  Lab 06/27/11 0610 06/26/11 1418 06/26/11 0410 06/25/11 0450 06/24/11 0443 06/23/11 2250  NA 139 -- 137 139 138 137  K 3.5 -- 4.1 -- -- --  CL 111 -- 110 111 110 110  CO2 20 -- 18* 19 19 19   BUN 27* -- 30* 35* 27* 25*  CREATININE  1.46* -- 1.66* 2.08* 1.66* 1.42*  CALCIUM 8.2* -- 8.1* 7.8* 7.7* 7.2*  MG 1.6 1.6 -- -- -- --  PHOS -- -- -- -- -- --   Positive fluid balance (9.3 L since admission to ICU).  A:  Acute on chronic renal failure, likely prerenal vs transient hypoperfusion, improving. P: --> KVO --> BMP in AM  GASTROINTESTINAL  A:  No acute issue P:  Protonix for GI Px  HEMATOLOGIC  Lab 06/27/11 0610 06/26/11 1418 06/26/11 0410 06/25/11 0450 06/24/11 0443 06/23/11 2250  HGB 8.0* 8.9* 8.8* 10.1* 11.8* --  HCT 23.8* 26.7* 25.9* 30.1* 34.4* --  PLT 85* -- 76* 98* 97* 93*  INR -- -- -- -- -- 1.29  APTT -- -- -- -- -- --    A:   Anemia and thrombocytopenia likely due to blood loss and dilution. Less likely due to aortic bleeding per Vascular surgeon.  P: --> CBC at 2:00 PM --> Transfuse per vascular  INFECTIOUS  Lab 06/27/11 0610 06/26/11 0410 06/25/11 0450 06/24/11 0443 06/23/11 2250  WBC 8.1 10.0 15.1* 14.1* 13.4*  PROCALCITON -- -- -- -- --   A:  No signs of infection, leukocytosis resolved P: -->  CBC in AM  ENDOCRINE  Lab 06/23/11 2147  GLUCAP 148*  TSH 2.67.  A:  No DM. H/o hypothyrodism.  P: -->  Continue synthroid  BEST PRACTICE / DISPOSITION -->  ICU status under Neurosurgery -->  Vascular / PCCM consulting -->  Full code -->  Diet: Clear fluid -->  DVT Px: SCD -->  Protonix IV for GI Px -->  Family updated  Lorretta Harp, MD PGY1, Internal Medicine Teaching Service Pager: [phone] 06/27/2011, 7:52 AM  Patient examined.  Records reviewed.  Assessment and plan above is edited and discussed with ICU Resident Team.  Orlean Bradford, M.D., F.C.C.P. Pulmonary and Critical Care Medicine [facility] Cell: [phone] Pager: [phone]

## 2011-06-27 NOTE — Progress Notes (Signed)
Patient ID: Jean [name], female   DOB: [birth year], 78 y.o.   MRN: [id number] Vital signs had shown some tachycardia but are now stable. Hemoglobin has been decreasing steadily now 8 g. Patient has had significant periods of confusion, despite not having much narcotic pain medication or other sedating medications. She does have a history of having had a stroke in the past. CT scan 2 days ago shows no intracranial pathology other than diffuse atrophy.  On today's exam with continuous loud stimulation she does follow commands moves all 4 extremities although weakly. She answers questions of orientation that are old and known to her such as identifying her husband, children, how long she's been married, but does not have current orientation except to place. Mild delirium has been noted by caretakers. Incisions on back are clean and dry.  Plan continue critical care management here in medical ICU. To receive several units of packed cells today. Will continue to follow with supportive measures. Do not that her renal failure is improving with creatinine now down to 1.4. Appreciate help of critical care service, vascular surgery area

## 2011-06-28 ENCOUNTER — Inpatient Hospital Stay (HOSPITAL_COMMUNITY): Payer: Medicare Other

## 2011-06-28 LAB — CBC
HCT: 33.8 % — ABNORMAL LOW (ref 36.0–46.0)
Hemoglobin: 11.8 g/dL — ABNORMAL LOW (ref 12.0–15.0)
MCHC: 34.9 g/dL (ref 30.0–36.0)
RBC: 4.12 MIL/uL (ref 3.87–5.11)
RDW: 15.2 % (ref 11.5–15.5)
WBC: 8.1 10*3/uL (ref 4.0–10.5)

## 2011-06-28 LAB — TYPE AND SCREEN: ABO/RH(D): B POS

## 2011-06-28 LAB — BASIC METABOLIC PANEL
CO2: 22 mEq/L (ref 19–32)
Calcium: 8.4 mg/dL (ref 8.4–10.5)
Chloride: 105 mEq/L (ref 96–112)
Glucose, Bld: 111 mg/dL — ABNORMAL HIGH (ref 70–99)
Potassium: 3.4 mEq/L — ABNORMAL LOW (ref 3.5–5.1)
Sodium: 137 mEq/L (ref 135–145)

## 2011-06-28 NOTE — Progress Notes (Signed)
Patient ID: Jean [name], female   DOB: 1928/04/17, 78 y.o.   MRN: [id number] Patient's much more alert and oriented today. Motor function appears good and lower extremities. Has not been ambulating as of yet. Hemoglobin improved to 11.3 after transfusions. Patient to be transferred to step down unit today. Will write orders to resume PT and OT.

## 2011-06-28 NOTE — Progress Notes (Signed)
Physical Therapy Treatment Patient Details Name: Jean [name] MRN: [id number] DOB: 1928/07/12 Today's Date: 06/28/2011 Time: 4098-1191 PT Time Calculation (min): 28 min  PT Assessment / Plan / Recommendation Comments on Treatment Session  Pt s/p back surgery and aortic repair.  More alert today but remains globally very weak and needed maxisky lift for transfer.  Pt also with little to no attempt to move or use LUE.  Progress remains very slow.    Follow Up Recommendations  Inpatient Rehab    Equipment Recommendations  Defer to next venue    Frequency Min 5X/week   Plan Discharge plan remains appropriate    Precautions / Restrictions Precautions Precautions: Back;Fall Required Braces or Orthoses: Spinal Brace Spinal Brace: Lumbar corset;Applied in sitting position   Pertinent Vitals/Pain VSS. No c/o's of pain.    Mobility  Bed Mobility Rolling Right: 1: +2 Total assist Rolling Right: Patient Percentage: 30% Right Sidelying to Sit: 1: +2 Total assist;HOB flat Right Sidelying to Sit: Patient Percentage: 20% Sitting - Scoot to Edge of Bed: 1: +2 Total assist Sitting - Scoot to Edge of Bed: Patient Percentage: 20% Sit to Sidelying Right: 1: +2 Total assist Sit to Sidelying Right: Patient Percentage: 20% Details for Bed Mobility Assistance: Pt able to maintain knees in bent position to facilitate rolling.  Needed assist for legs and trunk.  Verbal cues to even try to use LUE. Transfers Transfer via Scientist, water quality Details for Transfer Assistance: Pt demonstrating <3/5 LE strength and poor trunk control on EOB so Maxisky used.    Exercises General Exercises - Lower Extremity Long Arc Quad: AAROM;Strengthening;Both;5 reps;Seated   PT Goals Acute Rehab PT Goals PT Goal: Rolling Supine to Right Side - Progress: Not progressing PT Goal: Supine/Side to Sit - Progress: Not progressing PT Goal: Sit to Supine/Side - Progress: Not progressing PT Goal: Sit to Stand -  Progress: Not progressing PT Goal: Stand to Sit - Progress: Not progressing PT Transfer Goal: Bed to Chair/Chair to Bed - Progress: Not progressing PT Goal: Ambulate - Progress: Not progressing  Visit Information  Last PT Received On: 06/28/11 Assistance Needed: +2    Subjective Data  Subjective: Pt agrees that she feels better.   Cognition  Overall Cognitive Status: Impaired Arousal/Alertness: Awake/alert Behavior During Session: WFL for tasks performed Current Attention Level: Sustained Following Commands: Follows one step commands consistently Cognition - Other Comments: Slow to respond.    Balance  Static Sitting Balance Static Sitting - Balance Support: Bilateral upper extremity supported;Feet supported Static Sitting - Level of Assistance: 3: Mod assist Static Sitting - Comment/# of Minutes: 10 minutes.  Pt in flexed posture with posterior lean.  Didn't use LUE for support at all.  End of Session PT - End of Session Equipment Utilized During Treatment: Back brace;Other (comment) (maxisky) Activity Tolerance: Patient limited by fatigue Patient left: in chair;with call bell/phone within reach;with family/visitor present Nurse Communication: Need for lift equipment;Other (comment) (removal of brace once back in bed.)    Necia Kamm 06/28/2011, 12:37 PM  [facility] PT [phone]

## 2011-06-28 NOTE — Progress Notes (Signed)
Name: Jean [name] MRN: [id number] DOB: March 07, 1928    LOS: 5  PCCM PROGRESS NOTE  Active Problems:  HYPOTHYROIDISM, UNSPECIFIED  HYPERTENSION  CORONARY ATHEROSCLEROSIS NATIVE CORONARY ARTERY  Status post lumbar surgery  Aortic injury, S/P vascular surgery  ARF (acute renal failure)  Delirium, acute   History of Present Illness: 78 y/o female admitted on 06/23/2011 for an elective L2-L3 lumbar surgery complicated by aortic injury requiring laparotomy and repair by Vascular surgery. During the procedure she lost an estimated 1800cc of blood and received 3400cc of LR, 1000cc NS, 500cc hetastarch, 600cc of Cell Saver, and 2 U PRBC.  Lines / Drains: Peripheral IV  Cultures: None  Antibiotics: Vanc 4/22 (perioperative vascular) x1  Tests / Events:  4/22 (Dr. Danielle Dess) Bilateral laminotomies and foraminotomies decompression L2-L3 with decompression of L2 and L3 nerve roots greater dissection then for simple interbody arthrodesis. Posterior interbody arthrodesis using peek spacers with autograft and allograft. Removal of previously placed hardware L3-L5, pedicle screw fixation L2-L3 with posterior lateral arthrodesis using allograft and autograft   4/22 (Dr. Imogene Burn) Exploratory celiotomy and repair of aorta  4/23 patient's UOP increased to 75 cc/hour 4/23 started metoprolol 100 mg bid due to elevated Bp. 4/24 started methyldopa 250 mg bid due to elevated Bp. 4/24 Ct-head: no acute intracranial abnormalities 4/26 started clear liquid diet 4/26 increased metoprolol dose  From 100 mg bid to home dose 150 mg bid.  Subjective:  More interactive and less confused today. Very weak but working w PT to get up to chair. Took her PO meds this am.   Vital Signs: Temp:  [97.4 F (36.3 C)-98.1 F (36.7 C)] 97.8 F (36.6 C) (04/27 0823) Pulse Rate:  [59-117] 60  (04/27 0800) Resp:  [11-20] 15  (04/27 0800) BP: (119-168)/(45-82) 130/51 mmHg (04/27 0800) SpO2:  [90 %-100 %] 100 % (04/27  0800) I/O last 3 completed shifts: In: 2185 [P.O.:150; I.V.:1335; Blood:700] Out: 3220 [Urine:3220]  Physical Examination:  general: resting at bedside, not in acute distress HEENT: PERRL, EOMI, no scleral icterus Cardiac: S1/S2, RRR, No murmurs, gallops or rubs Pulm: slightly decreased air movement on right side,  Clear to auscultation bilaterally, No rales, wheezing, rhonchi or rubs. Abd: Soft,  nondistended, has tenderness over the surgical site, no organomegaly, BS present Ext: No rashes or edema, 2+DP/PT pulse bilaterally. Has back pain Neuro: confused and oriented to time (year) only, cranial nerves II-XII grossly intact, muscle strength 5/5 in all extremeties,  sensation to light touch intact.   Ventilator settings: not on ventilator   Labs and Imaging:  Reviewed.  Please refer to the Assessment and Plan section for relevant results.  ASSESSMENT AND PLAN  NEUROLOGIC A:  Status post L2-L3 decompression. Fair pain control. Acute delirium. Has history of CVA without motor deficit.  Negative head CT this admission. P: -->  Hold home ASA and plavix -->  Fentanyl 25-50 q2h PRN pain -->  Follow neurosurgery recommendations -->  Continue Risperdal 0.5 mg PO qHS -->  OOBTC -->  OK for meds PO and clears  PULMONARY  A:  Atelectasis due to pain. Pulmonary vascular congestion.  Small bilateral pleural effusions. P: --> Incentive spirometry --> KVO  CARDIOVASCULAR  Lab 06/23/11 2250  TROPONINI <0.30  LATICACIDVEN --  PROBNP --   A:  History of CAD and MI s/p stent. Intraoperative aortic injury.  Hypertension.    P: -->  Hold ASA and plavix for now -->  Metoprolol per home regimen (150 mg PO bid) -->  Methyldopa per home regimen -->  Follow vascular recommendations  RENAL  Lab 06/28/11 0336 06/27/11 0610 06/26/11 1418 06/26/11 0410 06/25/11 0450 06/24/11 0443  NA 137 139 -- 137 139 138  K 3.4* 3.5 -- -- -- --  CL 105 111 -- 110 111 110  CO2 22 20 -- 18* 19 19  BUN  27* 27* -- 30* 35* 27*  CREATININE 1.52* 1.46* -- 1.66* 2.08* 1.66*  CALCIUM 8.4 8.2* -- 8.1* 7.8* 7.7*  MG -- 1.6 1.6 -- -- --  PHOS -- -- -- -- -- --   Positive fluid balance (9.3 L since admission to ICU).  A:  Acute on chronic renal failure, likely prerenal vs transient hypoperfusion, improving. P: --> received lasix x 1 on 4/26 --> KVO --> BMP in AM  GASTROINTESTINAL  A:  No acute issue P:  Protonix for GI Px  HEMATOLOGIC  Lab 06/28/11 0336 06/27/11 1946 06/27/11 0610 06/26/11 1418 06/26/11 0410 06/25/11 0450 06/23/11 2250  HGB 11.8* 11.8* 8.0* 8.9* 8.8* -- --  HCT 33.8* 34.4* 23.8* 26.7* 25.9* -- --  PLT 93* 84* 85* -- 76* 98* --  INR -- -- -- -- -- -- 1.29  APTT -- -- -- -- -- -- --    A:   Anemia and thrombocytopenia likely due to blood loss and dilution. Less likely due to aortic bleeding per Vascular surgeon.  P: --> Transfuse per vascular's HGB goals  INFECTIOUS  Lab 06/28/11 0336 06/27/11 1946 06/27/11 0610 06/26/11 0410 06/25/11 0450  WBC 8.1 7.7 8.1 10.0 15.1*  PROCALCITON -- -- -- -- --   A:  No signs of infection, leukocytosis resolved P: -->  CBC in AM  ENDOCRINE  Lab 06/23/11 2147  GLUCAP 148*  TSH 2.67.  A:  No DM. H/o hypothyrodism.  P: -->  Continue synthroid  BEST PRACTICE / DISPOSITION -->  Change to SDU status under Neurosurgery -->  Vascular / PCCM consulting -->  Full code -->  Diet: Clear fluid + meds -->  DVT Px: SCD -->  Protonix IV for GI Px -->  Family updated  Levy Pupa, MD, PhD 06/28/2011, 9:41 AM [facility] [phone] or if no answer [phone]

## 2011-06-28 NOTE — Progress Notes (Addendum)
[facility] [place]  Post-op  Intra-abdominal Surgery note  Date of Surgery: 06/23/2011 Surgeon: Surgeon(s): Barnett Abu, MD Fransisco Hertz, MD Chuck Hint, MD POD: 5 Days Post-Op Procedure(s): POSTERIOR LUMBAR FUSION 1 WITH HARDWARE REMOVAL EXPLORATORY LAPAROTOMY ABDOMINAL EXPOSURE Aortic repair for aortic injury during lumbar fusion   History of Present Illness  Jean [name] is a 78 y.o. female who is  up s/p Procedure(s): POSTERIOR LUMBAR FUSION 1 WITH HARDWARE REMOVAL EXPLORATORY LAPAROTOMY ABDOMINAL EXPOSURE with aortic repair Pt is doing well.  She has been advance to clear liquids today and is alert and orient to person and place. IMAGING: Dg Chest Port 1 View  06/27/2011  *RADIOLOGY REPORT*  Clinical Data: Shortness of breath.  Evaluate for atelectasis.  PORTABLE CHEST - 1 VIEW  Comparison: Chest x-ray 06/23/2011.  Findings: Lung volumes are low.  There are bibasilar opacities which are favored to reflects small bilateral pleural effusions with superimposed areas of atelectasis (underlying air space consolidation is difficult to exclude).  Pulmonary vasculature is within normal limits.  Heart size is mildly enlarged (unchanged). The patient is rotated to the left on today's exam, resulting in distortion of the mediastinal contours and reduced diagnostic sensitivity and specificity for mediastinal pathology. Atherosclerotic calcifications within the arch of the aorta.  IMPRESSION: 1.  Low lung volumes with bibasilar opacities favored to reflects small bilateral pleural effusions with superimposed areas of passive atelectasis (underlying airspace consolidation from infection or aspiration is difficult to exclude). 2.  Mild cardiomegaly is unchanged. 3.  Atherosclerosis.  Original Report Authenticated By: Florencia Reasons, M.D.    Significant Diagnostic Studies: CBC Lab Results  Component Value Date   WBC 8.1 06/28/2011   HGB 11.8* 06/28/2011   HCT 33.8* 06/28/2011   MCV 82.0 06/28/2011   PLT 93* 06/28/2011    BMET    Component Value Date/Time   NA 137 06/28/2011 0336   K 3.4* 06/28/2011 0336   CL 105 06/28/2011 0336   CO2 22 06/28/2011 0336   GLUCOSE 111* 06/28/2011 0336   GLUCOSE 100* 01/30/2006 1630   BUN 27* 06/28/2011 0336   CREATININE 1.52* 06/28/2011 0336   CALCIUM 8.4 06/28/2011 0336   CALCIUM 9.5 08/18/2006 0105   GFRNONAA 31* 06/28/2011 0336   GFRAA 36* 06/28/2011 0336    COAG Lab Results  Component Value Date   INR 1.29 06/23/2011   INR 1.0 ratio 11/15/2009   INR 1.0 ratio 09/24/2009   No results found for this basename: PTT    I/O last 3 completed shifts: In: 2145 [P.O.:150; I.V.:1295; Blood:700] Out: 3220 [Urine:3220]    Physical Examination BP Readings from Last 3 Encounters:  06/28/11 166/70  06/28/11 166/70  06/28/11 166/70   Temp Readings from Last 3 Encounters:  06/28/11 97.8 F (36.6 C) Oral  06/28/11 97.8 F (36.6 C) Oral  06/28/11 97.8 F (36.6 C) Oral   SpO2 Readings from Last 3 Encounters:  06/28/11 100%  06/28/11 100%  06/28/11 100%   Pulse Readings from Last 3 Encounters:  06/28/11 71  06/28/11 71  06/28/11 71    General: A&O x 3, WDWN female in NAD Pulmonary: normal non-labored breathing , without Rales, rhonchi,  wheezing Abdomen:abdomen soft Abdominal wound:clean, dry, intact Positive bowel sounds Neurologic: A&O X 3; Appropriate Affect ; SENSATION: normal; MOTOR FUNCTION:  moving all extremities equally. Speech is fluent/normal  Vascular Exam:BLE warm and well perfused Extremities without ischemic changes, no Gangrene , no cellulitis; no open wounds;  LOWER EXTREMITY PULSES           RIGHT                                      LEFT      POSTERIOR TIBIAL present 1+ and palpable present 1+ and palpable       DORSALIS PEDIS      ANTERIOR TIBIAL         PERONEAL       Assessment/Plan: Jean [name] is a 78 y.o. female who is 5 Days Post-Op Procedure(s): POSTERIOR  LUMBAR FUSION 1 WITH HARDWARE REMOVAL EXPLORATORY LAPAROTOMY ABDOMINAL EXPOSURE with aortic repair   Clear liquids- positive bowel sounds -alert today and oriented to person and place    Jean [name] [phone] 06/28/2011 8:31 AM      Making steady progress following suture repair of aortic injury On clear liquid diet today We'll mobilize as per Dr. Danielle Dess Hematocrit stable and renal function adequate We'll continue to follow

## 2011-06-29 LAB — BASIC METABOLIC PANEL
BUN: 27 mg/dL — ABNORMAL HIGH (ref 6–23)
CO2: 22 mEq/L (ref 19–32)
Chloride: 102 mEq/L (ref 96–112)
Creatinine, Ser: 1.46 mg/dL — ABNORMAL HIGH (ref 0.50–1.10)
GFR calc Af Amer: 38 mL/min — ABNORMAL LOW (ref >90)
Potassium: 3.2 mEq/L — ABNORMAL LOW (ref 3.5–5.1)

## 2011-06-29 LAB — CBC
HCT: 32.6 % — ABNORMAL LOW (ref 36.0–46.0)
Hemoglobin: 11.7 g/dL — ABNORMAL LOW (ref 12.0–15.0)
MCV: 83.2 fL (ref 78.0–100.0)
RDW: 15.3 % (ref 11.5–15.5)
WBC: 7.8 10*3/uL (ref 4.0–10.5)

## 2011-06-29 NOTE — Progress Notes (Signed)
Patient ID: Jean [name], female   DOB: 02-08-1929, 78 y.o.   MRN: [id number] Patient is doing much better. She is more alert smiling. Her left side however does seem to have some weakness and some lack of spontaneity  in its movement.  Patient offers no complaints of abdominal pain or back pain.  Mobilization is been very slow will likely require further extensive physical therapy and comprehensive inpatient rehabilitation.

## 2011-06-29 NOTE — Progress Notes (Signed)
Patient ID: Jean [name], female   DOB: [birth year], 78 y.o.   MRN: [id number] Vascular Surgery Progress Note  Subjective: Doing well post repair of puncture wound aorta during lumbar surgery and tolerating diet. Has had bowel movement. Not complaining of abdominal pain.2  Objective:  Filed Vitals:   06/29/11 0815  BP: 147/64  Pulse: 65  Temp: 98.1 F (36.7 C)  Resp: 16    Patient alert and oriented x3 HEENT normal for age Lungs no rhonchi or wheezing Abdomen mildly distended but nontender 3+ femoral pulses palpable bilaterally with well-perfused lower extremities   Labs:  Lab 06/29/11 0340 06/28/11 0336 06/27/11 0610  CREATININE 1.46* 1.52* 1.46*    Lab 06/29/11 0340 06/28/11 0336 06/27/11 0610  NA 135 137 139  K 3.2* 3.4* 3.5  CL 102 105 111  CO2 22 22 20   BUN 27* 27* 27*  CREATININE 1.46* 1.52* 1.46*  LABGLOM -- -- --  GLUCOSE 100* -- --  CALCIUM 8.2* 8.4 8.2*    Lab 06/29/11 0340 06/28/11 0336 06/27/11 1946  WBC 7.8 8.1 7.7  HGB 11.7* 11.8* 11.8*  HCT 32.6* 33.8* 34.4*  PLT 107* 93* 84*    Lab 06/23/11 2250  INR 1.29    I/O last 3 completed shifts: In: 1510 [P.O.:840; I.V.:320; Other:350] Out: 2800 [Urine:2800]  Imaging: Dg Chest Port 1 View  06/28/2011  *RADIOLOGY REPORT*  Clinical Data: Possible pulmonary edema  PORTABLE CHEST - 1 VIEW  Comparison: Chest radiograph 04/26 1013  Findings: Stable cardiac silhouette.  There is left lower lobe atelectasis similar to prior.  There is some improvement in the air space disease the right lower lobe.  No pneumothorax.  IMPRESSION:  1.  Persistent left lower lobe atelectasis. 2.  Improvement in right lower lobe air space disease.  Original Report Authenticated By: Genevive Bi, M.D.    Assessment/Plan:  POD #6  LOS: 6 days  s/p Procedure(s): POSTERIOR LUMBAR FUSION 1 WITH HARDWARE REMOVAL EXPLORATORY LAPAROTOMY ABDOMINAL EXPOSURERepair of aortic injury  From a vascular standpoint-will continue to  follow-increase diet as tolerated   Josephina Gip, MD 06/29/2011 9:21 AM

## 2011-06-29 NOTE — Progress Notes (Signed)
Name: Jean [name] MRN: [id number] DOB: 17-Sep-1928    LOS: 6  PCCM PROGRESS NOTE  Active Problems:  HYPOTHYROIDISM, UNSPECIFIED  HYPERTENSION  CORONARY ATHEROSCLEROSIS NATIVE CORONARY ARTERY  Status post lumbar surgery  Aortic injury, S/P vascular surgery  ARF (acute renal failure)  Delirium, acute   History of Present Illness: 78 y/o female admitted on 06/23/2011 for an elective L2-L3 lumbar surgery complicated by aortic injury requiring laparotomy and repair by Vascular surgery. During the procedure she lost an estimated 1800cc of blood and received 3400cc of LR, 1000cc NS, 500cc hetastarch, 600cc of Cell Saver, and 2 U PRBC.  Lines / Drains: Peripheral IV  Cultures: None  Antibiotics: Vanc 4/22 (perioperative vascular) x1  Tests / Events:  4/22 (Dr. Danielle Dess) Bilateral laminotomies and foraminotomies decompression L2-L3 with decompression of L2 and L3 nerve roots greater dissection then for simple interbody arthrodesis. Posterior interbody arthrodesis using peek spacers with autograft and allograft. Removal of previously placed hardware L3-L5, pedicle screw fixation L2-L3 with posterior lateral arthrodesis using allograft and autograft   4/22 (Dr. Imogene Burn) Exploratory celiotomy and repair of aorta  4/23 patient's UOP increased to 75 cc/hour 4/23 started metoprolol 100 mg bid due to elevated Bp. 4/24 started methyldopa 250 mg bid due to elevated Bp. 4/24 Ct-head: no acute intracranial abnormalities 4/26 started clear liquid diet 4/26 increased metoprolol dose  From 100 mg bid to home dose 150 mg bid.  Subjective:  Family in room. She says she is getting better. Denies respiratory discomfort.   Vital Signs: Temp:  [97.4 F (36.3 C)-99.2 F (37.3 C)] 98.1 F (36.7 C) (04/28 0815) Pulse Rate:  [60-93] 65  (04/28 0815) Resp:  [16-21] 16  (04/28 0815) BP: (128-153)/(50-64) 147/64 mmHg (04/28 0815) SpO2:  [96 %-100 %] 99 % (04/28 0815) Weight:  [77.3 kg (170 lb 6.7 oz)]  77.3 kg (170 lb 6.7 oz) (04/28 0500) I/O last 3 completed shifts: In: 1510 [P.O.:840; I.V.:320; Other:350] Out: 2800 [Urine:2800]  Physical Examination:  general: resting in bed, not in acute distress, responses appropriate HEENT: PERRL, EOMI, no scleral icterus Cardiac: S1/S2, RRR, No murmurs, gallops or rubs Pulm: clear anteriorly,   No rales, wheezing, rhonchi or rubs. Abd: Soft,  nondistended, has tenderness over the surgical site, no organomegaly, BS present Ext: No rashes or edema, 2+DP/PT pulse bilaterally. Has back pain Neuro: pleasantly alert but weak, seems oriented,    Ventilator settings: not on ventilator   Labs and Imaging:  Reviewed.  Please refer to the Assessment and Plan section for relevant results.  ASSESSMENT AND PLAN  NEUROLOGIC A:  Status post L2-L3 decompression. Fair pain control. Acute delirium. Has history of CVA without motor deficit.  Negative head CT this admission. P: -->  Hold home ASA and plavix -->  Fentanyl 25-50 q2h PRN pain -->  Follow neurosurgery recommendations -->  Continue Risperdal 0.5 mg PO qHS- May be able to stop tomorrow. -->  OOBTC -->  OK for meds PO and clears  PULMONARY  A:  Atelectasis due to pain. Pulmonary vascular congestion.  Small bilateral pleural effusions. P: --> Incentive spirometry --> KVO  CARDIOVASCULAR  Lab 06/23/11 2250  TROPONINI <0.30  LATICACIDVEN --  PROBNP --   A:  History of CAD and MI s/p stent. Intraoperative aortic injury.  Hypertension.    P: -->  Hold ASA and plavix for now -->  Metoprolol per home regimen (150 mg PO bid) -->  Methyldopa per home regimen -->  Follow vascular recommendations  RENAL  Lab 06/29/11 0340 06/28/11 0336 06/27/11 0610 06/26/11 1418 06/26/11 0410 06/25/11 0450  NA 135 137 139 -- 137 139  K 3.2* 3.4* -- -- -- --  CL 102 105 111 -- 110 111  CO2 22 22 20  -- 18* 19  BUN 27* 27* 27* -- 30* 35*  CREATININE 1.46* 1.52* 1.46* -- 1.66* 2.08*  CALCIUM 8.2* 8.4 8.2*  -- 8.1* 7.8*  MG -- -- 1.6 1.6 -- --  PHOS -- -- -- -- -- --   Positive fluid balance (9.3 L since admission to ICU).  A:  Acute on chronic renal failure, likely prerenal vs transient hypoperfusion, improving. P: --> received lasix x 1 on 4/26 --> KVO --> BMP in AM again tomorrow- watching K  GASTROINTESTINAL  A:  No acute issue P:  Protonix for GI Px  HEMATOLOGIC  Lab 06/29/11 0340 06/28/11 0336 06/27/11 1946 06/27/11 0610 06/26/11 1418 06/26/11 0410 06/23/11 2250  HGB 11.7* 11.8* 11.8* 8.0* 8.9* -- --  HCT 32.6* 33.8* 34.4* 23.8* 26.7* -- --  PLT 107* 93* 84* 85* -- 76* --  INR -- -- -- -- -- -- 1.29  APTT -- -- -- -- -- -- --    A:   Anemia and thrombocytopenia likely due to blood loss and dilution. Less likely due to aortic bleeding per Vascular surgeon.  P: --> Transfuse per vascular's HGB goals  INFECTIOUS  Lab 06/29/11 0340 06/28/11 0336 06/27/11 1946 06/27/11 0610 06/26/11 0410  WBC 7.8 8.1 7.7 8.1 10.0  PROCALCITON -- -- -- -- --   A:  No signs of infection, leukocytosis resolved P: -->  CBC in AM  ENDOCRINE  Lab 06/23/11 2147  GLUCAP 148*  TSH 2.67.  A:  No DM. H/o hypothyrodism.  P: -->  Continue synthroid  BEST PRACTICE / DISPOSITION -->  Change to SDU status under Neurosurgery -->  Vascular / PCCM consulting -->  Full code -->  Diet: Clear fluid + meds -->  DVT Px: SCD -->  Protonix IV for GI Px -->  Family updated

## 2011-06-30 DIAGNOSIS — E876 Hypokalemia: Secondary | ICD-10-CM

## 2011-06-30 DIAGNOSIS — J811 Chronic pulmonary edema: Secondary | ICD-10-CM

## 2011-06-30 DIAGNOSIS — IMO0002 Reserved for concepts with insufficient information to code with codable children: Secondary | ICD-10-CM

## 2011-06-30 DIAGNOSIS — M47817 Spondylosis without myelopathy or radiculopathy, lumbosacral region: Secondary | ICD-10-CM

## 2011-06-30 LAB — CBC
HCT: 32.7 % — ABNORMAL LOW (ref 36.0–46.0)
Hemoglobin: 11.2 g/dL — ABNORMAL LOW (ref 12.0–15.0)
MCH: 28.6 pg (ref 26.0–34.0)
MCHC: 34.3 g/dL (ref 30.0–36.0)
MCV: 83.4 fL (ref 78.0–100.0)
RDW: 15.1 % (ref 11.5–15.5)

## 2011-06-30 LAB — DIFFERENTIAL
Basophils Absolute: 0 10*3/uL (ref 0.0–0.1)
Basophils Relative: 0 % (ref 0–1)
Eosinophils Absolute: 0.2 10*3/uL (ref 0.0–0.7)
Eosinophils Relative: 3 % (ref 0–5)
Monocytes Absolute: 1.1 10*3/uL — ABNORMAL HIGH (ref 0.1–1.0)
Monocytes Relative: 14 % — ABNORMAL HIGH (ref 3–12)

## 2011-06-30 LAB — BASIC METABOLIC PANEL
BUN: 26 mg/dL — ABNORMAL HIGH (ref 6–23)
Chloride: 100 mEq/L (ref 96–112)
Creatinine, Ser: 1.44 mg/dL — ABNORMAL HIGH (ref 0.50–1.10)
GFR calc Af Amer: 38 mL/min — ABNORMAL LOW (ref >90)
Glucose, Bld: 106 mg/dL — ABNORMAL HIGH (ref 70–99)
Potassium: 3.2 mEq/L — ABNORMAL LOW (ref 3.5–5.1)

## 2011-06-30 MED ORDER — FUROSEMIDE 10 MG/ML IJ SOLN
40.0000 mg | Freq: Once | INTRAMUSCULAR | Status: AC
  Administered 2011-06-30: 40 mg via INTRAVENOUS
  Filled 2011-06-30: qty 4

## 2011-06-30 MED ORDER — BOOST / RESOURCE BREEZE PO LIQD
1.0000 | Freq: Two times a day (BID) | ORAL | Status: DC
  Administered 2011-06-30 – 2011-07-02 (×5): 1 via ORAL

## 2011-06-30 MED ORDER — POTASSIUM CHLORIDE CRYS ER 20 MEQ PO TBCR
40.0000 meq | EXTENDED_RELEASE_TABLET | Freq: Three times a day (TID) | ORAL | Status: AC
  Administered 2011-06-30 (×2): 40 meq via ORAL
  Filled 2011-06-30 (×2): qty 2

## 2011-06-30 NOTE — Progress Notes (Signed)
Attempt to ambulate pt.. Pt presents w/ generalized weakness and inability to to stand erect, unable to amb at this time. ROM activities performed.  Encouraged pt to perform ROM exercise 4x/ hr. Placed pt in chair by bedside w/ call bell in close proximity.  Will continue to monitor status.

## 2011-06-30 NOTE — Consult Note (Signed)
Physical Medicine and Rehabilitation Consult Reason for Consult: Lumbar stenosis/radiculopathy Referring Phsyician: Dr. Abram Sander is an 78 y.o. female.   HPI: 78 year old right-handed female with history of back surgery 2005. Presented 06/23/2011 with low back pain radiating to the left lower extremity. X-ray and imaging revealed lumbar L2-3 stenosis, kyphoscoliosis and radiculopathy. Underwent bilateral laminotomies and foraminotomies decompression lumbar L2-3 with decompression of lumbar 21 III nerve roots as well as removal of previously placed hardware lumbar L3-5, pedicle screw fixation lumbar 2-3 per Dr. Danielle Dess 06/23/2011. Patient developed acute arterial bleeding during the case with vascular surgery consulted to Dr. Imogene Burn and underwent repair of avulsed lumbar artery. During the procedure she lost an estimated 1800 cc of blood and did receive 2 units of packed red blood cells. Patient with chronic renal insufficiency creatinine 1.5-1.8 and monitored with latest creatinine 1.44 on 06/30/2011. She presently remains off of her aspirin and Plavix therapy as prior to admission. Therapies have been initiated she is wearing a back corset when out of bed noted some bouts of confusion. Recommendations were made for physical medicine rehabilitation consult to consider inpatient rehabilitation services  Review of Systems  Cardiovascular: Positive for palpitations.  Gastrointestinal: Positive for constipation.  Musculoskeletal: Positive for back pain.  All other systems reviewed and are negative.   Past Medical History  Diagnosis Date  . Hyperlipidemia   . Thyroid disease     hypo  . CHF (congestive heart failure)   . Mitral regurgitation   . Tricuspid regurgitation   . Anemia   . UTI (lower urinary tract infection)   . Arthritis     Osteoarthtistis  . Elevated glucose   . CVA (cerebral infarction)   . Vitamin d deficiency   . Hypothyroidism   . Myocardial infarction   . Chronic  kidney failure     stage 3  . CAD (coronary artery disease)     sees Dr. Ricka Burdock last 03/2011  . Hypertension     Dr. Eden Emms cardiologist, Dr. Cheron Schaumann primary doctor   Past Surgical History  Procedure Date  . Abdominal hysterectomy   . Back surgery   . Coronary stent placement   . Laparotomy 06/23/2011    Procedure: EXPLORATORY LAPAROTOMY;  Surgeon: Barnett Abu, MD;  Location: [facility];  Service: Neurosurgery;  Laterality: Bilateral;   Family History  Problem Relation Age of Onset  . Aneurysm Mother   . Heart disease Mother   . Heart attack Father   . Hypertension Father   . Anesthesia problems Neg Hx    Social History:  reports that she has never smoked. She does not have any smokeless tobacco history on file. She reports that she does not drink alcohol or use illicit drugs. Allergies:  Allergies  Allergen Reactions  . Ace Inhibitors   . Ezetimibe-Simvastatin     REACTION: Myalgia  . Rosuvastatin     REACTION: aches  . Penicillins Rash   Medications Prior to Admission  Medication Sig Dispense Refill  . aspirin 325 MG tablet Take 325 mg by mouth daily.       . cholecalciferol (VITAMIN D) 1000 UNITS tablet Take 1,000 Units by mouth daily.      . clopidogrel (PLAVIX) 75 MG tablet Take 75 mg by mouth daily.      . Coenzyme Q10 (CO Q 10 PO) Take 1 tablet by mouth daily.       . meclizine (ANTIVERT) 25 MG tablet Take 25 mg by mouth 3 (three) times  daily as needed. dizziness      . methyldopa (ALDOMET) 250 MG tablet Take 250 mg by mouth 2 (two) times daily.      . metoprolol (LOPRESSOR) 100 MG tablet Take 150 mg by mouth 2 (two) times daily.      . nitroGLYCERIN (NITROSTAT) 0.4 MG SL tablet Place 0.4 mg under the tongue every 5 (five) minutes as needed. For chest pain      . pravastatin (PRAVACHOL) 20 MG tablet Take 20 mg by mouth 2 (two) times daily.        Home: Home Living Lives With: Spouse Type of Home: House Home Access: Stairs to enter Water quality scientist of Steps: 2 Entrance Stairs-Rails: None Home Layout: One level Bathroom Shower/Tub: Health visitor: Standard Home Adaptive Equipment: Walker - standard;Walker - rolling  Functional History: Prior Function Meal Prep: Other (comment) Light Housekeeping: Moderate Able to Take Stairs?: Yes Driving: No Vocation: Retired Comments: Per spouse pt was able to perform basic ADLs, transfers and gait without assist Functional Status:  Mobility: Bed Mobility Bed Mobility: Rolling Right;Right Sidelying to Sit;Sit to Sidelying Right;Scooting to [facility];Sitting - Scoot to [place] of Bed Rolling Right: 1: +2 Total assist Rolling Right: Patient Percentage: 30% Right Sidelying to Sit: 1: +2 Total assist;HOB flat Right Sidelying to Sit: Patient Percentage: 20% Sitting - Scoot to Edge of Bed: 1: +2 Total assist Sitting - Scoot to Edge of Bed: Patient Percentage: 20% Sit to Sidelying Right: 1: +2 Total assist Sit to Sidelying Right: Patient Percentage: 20% Scooting to HOB: 1: +2 Total assist Transfers Transfers: Sit to Stand;Stand to Sit Sit to Stand: From bed;1: +2 Total assist Sit to Stand: Patient Percentage: 10% Stand to Sit: 1: +2 Total assist;To bed Stand to Sit: Patient Percentage: 10% Stand Pivot Transfers: 1: +2 Total assist Stand Pivot Transfers: Patient Percentage: 20% Transfer via Lift Equipment: [place] Ambulation/Gait Ambulation/Gait Assistance: Not tested (comment) Stairs: No Wheelchair Mobility Wheelchair Mobility: No  ADL: ADL Eating/Feeding: Simulated;+1 Total assistance Where Assessed - Eating/Feeding: Chair Grooming: Simulated;Maximal assistance Where Assessed - Grooming: Supported sitting Upper Body Bathing: Simulated;+1 Total assistance Where Assessed - Upper Body Bathing: Sitting, chair Lower Body Bathing: Simulated;+2 Total assistance (+2 needed for sit to stand and to perform task) Where Assessed - Lower Body Bathing: Sit to stand from  bed Upper Body Dressing: Performed;+1 Total assistance Where Assessed - Upper Body Dressing: Sitting, bed Lower Body Dressing: Performed;+1 Total assistance (don socks) Where Assessed - Lower Body Dressing: Sitting, bed Toilet Transfer: Simulated;+2 Total assistance (pt=40%) Toilet Transfer Method: Stand pivot Toileting - Clothing Manipulation: Simulated;+1 Total assistance Where Assessed - Glass blower/designer Manipulation: Standing Toileting - Hygiene: Simulated;+1 Total assistance Where Assessed - Toileting Hygiene: Standing Tub/Shower Transfer: Not assessed Equipment Used: Back brace;Rolling walker Ambulation Related to ADLs: +2totalA (pt=40%) for stand-pivot from EOB to chair. Pt with kyphotic posture and attempting to sit throughout transfer despite VC and physical assist to remain standing upright. Also, manual facilitation to weight shift and advance bil LE ADL Comments: Pt extremely lethargic and following one-step commands ~20% of session  Cognition: Cognition Arousal/Alertness: Awake/alert Orientation Level: Oriented to person;Oriented to place;Oriented to time (pt now aware of present condition nor oriented to time) Cognition Overall Cognitive Status: Impaired Area of Impairment: Attention;Following commands;Memory Arousal/Alertness: Awake/alert Orientation Level: Other (comment) (pt would only respond to place and disoriented to it) Behavior During Session: [facility] for tasks performed Current Attention Level: Sustained Attention - Other Comments: extremely slow to respond verbally and  motorically Memory: Decreased recall of precautions Memory Deficits: pt unable to recall any precautions or situation Following Commands: Follows one step commands consistently Cognition - Other Comments: Slow to respond.  Blood pressure 139/63, pulse 65, temperature 98.3 F (36.8 C), temperature source Oral, resp. rate 14, height 5\' 7"  (1.702 m), weight 78 kg (171 lb 15.3 oz), SpO2  98.00%. Physical Exam  HENT:  Head: Normocephalic and atraumatic.  Eyes: Conjunctivae and EOM are normal. Pupils are equal, round, and reactive to light.  Neck: Neck supple. No thyromegaly present.  Cardiovascular: Regular rhythm.   Pulmonary/Chest: Breath sounds normal. She has no wheezes.  Abdominal: She exhibits no distension. There is no tenderness.  Musculoskeletal:       Left upper extremity with 2+  edema  Neurological: She is alert. No sensory deficit.       Needs some cues for date of birth. Affect is flat and slow to respond to some questions. She was able to name place and a picture of her grandson. Follow two-step simple commands. She does have some decreased awareness of her deficits. Seem to have more difficulty initiating movement with the left arm. CV weakness in both lower extremities with strength generally 1+ to 2+ out of 5 but inconsistent effort.  Skin:       Back incision clean and dressed  Psychiatric: She has a normal mood and affect. Her behavior is normal. Judgment and thought content normal.    Results for orders placed during the hospital encounter of 06/23/11 (from the past 24 hour(s))  BASIC METABOLIC PANEL     Status: Abnormal   Collection Time   06/30/11  4:09 AM      Component Value Range   Sodium 133 (*) 135 - 145 (mEq/L)   Potassium 3.2 (*) 3.5 - 5.1 (mEq/L)   Chloride 100  96 - 112 (mEq/L)   CO2 23  19 - 32 (mEq/L)   Glucose, Bld 106 (*) 70 - 99 (mg/dL)   BUN 26 (*) 6 - 23 (mg/dL)   Creatinine, Ser 1.61 (*) 0.50 - 1.10 (mg/dL)   Calcium 8.5  8.4 - 09.6 (mg/dL)   GFR calc non Af Amer 33 (*) >90 (mL/min)   GFR calc Af Amer 38 (*) >90 (mL/min)  CBC     Status: Abnormal   Collection Time   06/30/11  4:09 AM      Component Value Range   WBC 7.9  4.0 - 10.5 (K/uL)   RBC 3.92  3.87 - 5.11 (MIL/uL)   Hemoglobin 11.2 (*) 12.0 - 15.0 (g/dL)   HCT 04.5 (*) 40.9 - 46.0 (%)   MCV 83.4  78.0 - 100.0 (fL)   MCH 28.6  26.0 - 34.0 (pg)   MCHC 34.3  30.0 -  36.0 (g/dL)   RDW 81.1  91.4 - 78.2 (%)   Platelets 123 (*) 150 - 400 (K/uL)  DIFFERENTIAL     Status: Abnormal   Collection Time   06/30/11  4:09 AM      Component Value Range   Neutrophils Relative 66  43 - 77 (%)   Neutro Abs 5.2  1.7 - 7.7 (K/uL)   Lymphocytes Relative 17  12 - 46 (%)   Lymphs Abs 1.4  0.7 - 4.0 (K/uL)   Monocytes Relative 14 (*) 3 - 12 (%)   Monocytes Absolute 1.1 (*) 0.1 - 1.0 (K/uL)   Eosinophils Relative 3  0 - 5 (%)   Eosinophils Absolute 0.2  0.0 -  0.7 (K/uL)   Basophils Relative 0  0 - 1 (%)   Basophils Absolute 0.0  0.0 - 0.1 (K/uL)   Dg Chest Port 1 View  06/28/2011  *RADIOLOGY REPORT*  Clinical Data: Possible pulmonary edema  PORTABLE CHEST - 1 VIEW  Comparison: Chest radiograph 04/26 1013  Findings: Stable cardiac silhouette.  There is left lower lobe atelectasis similar to prior.  There is some improvement in the air space disease the right lower lobe.  No pneumothorax.  IMPRESSION:  1.  Persistent left lower lobe atelectasis. 2.  Improvement in right lower lobe air space disease.  Original Report Authenticated By: Genevive Bi, M.D.    Assessment/Plan: Diagnosis: Lumbar stenosis with radiculopathy with decompression, removal of hardware, fixation. Patient with postoperative confusion. 1. Does the need for close, 24 hr/day medical supervision in concert with the patient's rehab needs make it unreasonable for this patient to be served in a less intensive setting? Yes 2. Co-Morbidities requiring supervision/potential complications: CAD, hypertension, hyperlipidemia, CAD 3. Due to bladder management, bowel management, safety, skin/wound care, disease management, medication administration, pain management and patient education, does the patient require 24 hr/day rehab nursing? Yes 4. Does the patient require coordinated care of a physician, rehab nurse, PT (1-2 hrs/day, 5 days/week) and OT (1-2 hrs/day, 5 days/week) to address physical and functional deficits  in the context of the above medical diagnosis(es)? Yes Addressing deficits in the following areas: balance, endurance, locomotion, strength, transferring, bowel/bladder control, bathing, dressing, feeding, grooming, toileting and psychosocial support 5. Can the patient actively participate in an intensive therapy program of at least 3 hrs of therapy per day at least 5 days per week? Yes 6. The potential for patient to make measurable gains while on inpatient rehab is excellent 7. Anticipated functional outcomes upon discharge from inpatient rehab are supervision to minimal assist with PT, supervision to moderate assistance with OT, . 8. Estimated rehab length of stay to reach the above functional goals is: 2-3 weeks 9. Does the patient have adequate social supports to accommodate these discharge functional goals? Yes 10. Anticipated D/C setting: Home 11. Anticipated post D/C treatments: HH therapy 12. Overall Rehab/Functional Prognosis: excellent  RECOMMENDATIONS: This patient's condition is appropriate for continued rehabilitative care in the following setting: CIR Patient has agreed to participate in recommended program. Yes Note that insurance prior authorization may be required for reimbursement for recommended care.  Comment: Rehabilitation nurse to followup for activity tolerance and medical stability.   Ranelle Oyster M.D. 06/30/2011

## 2011-06-30 NOTE — Progress Notes (Signed)
Physical Therapy Treatment Patient Details Name: Jean [name] MRN: [id number] DOB: Jun 15, 1928 Today's Date: 06/30/2011 Time: 4098-1191 PT Time Calculation (min): 38 min  PT Assessment / Plan / Recommendation Comments on Treatment Session  Patient s/p back surgery and aorta repair.  Continues with weakness but was able to stand for brief time with assist of [place] plus lift.      Follow Up Recommendations  Inpatient Rehab    Equipment Recommendations  Defer to next venue    Frequency Min 5X/week   Plan Discharge plan remains appropriate;Frequency remains appropriate    Precautions / Restrictions Precautions Precautions: Fall;Back Precaution Comments: Reviewed precautions with pt, she cannot recall any Required Braces or Orthoses: Spinal Brace Spinal Brace: Lumbar corset;Applied in sitting position Restrictions Weight Bearing Restrictions: No   Pertinent Vitals/Pain VSS/ No pain c/o    Mobility  Bed Mobility Bed Mobility: Not assessed Rolling Right: Not tested (comment) Right Sidelying to Sit: Not tested (comment) Sitting - Scoot to Edge of Bed: Not tested (comment) Sit to Sidelying Right: Not Tested (comment) Scooting to [facility]: Not tested (comment) Details for Bed Mobility Assistance: Up in chair on arrival Transfers Transfers: Sit to Stand;Stand to Sit;Stand Pivot Transfers Sit to Stand: 1: +2 Total assist;With upper extremity assist;From chair/3-in-1 Sit to Stand: Patient Percentage: 40% (with [place] Dec Plus) Stand to Sit: 1: +2 Total assist;With upper extremity assist;To chair/3-in-1 Stand to Sit: Patient Percentage: 40% (with [place] Dec Plus) Stand Pivot Transfers: 1: +2 Total assist;With armrests Stand Pivot Transfers: Patient Percentage: 60% Transfer via Lift Equipment: Hydrographic surveyor Details for Transfer Assistance: Pt. continues to demonstrate <3/5 strength, therefore decided to try [employer] with footplate.  Patient was able to stand 2 x for 15 seconds and then 2 minutes.  Pt.  kept slightly crouched gait but did demonstrate some postural control in [place] plus with abliltiy to extend hips and trunk for semi-erect posture.  Crouched at hips and knees slightly and needed a lot of encouragement and facilitation to extend neck as she kept neck flexed throughtout treatment.   Ambulation/Gait Ambulation/Gait Assistance: Not tested (comment) Stairs: No Wheelchair Mobility Wheelchair Mobility: No    Exercises Total Joint Exercises Ankle Circles/Pumps: AROM;Both;10 reps;Seated Quad Sets: AROM;Both;10 reps;Seated Gluteal Sets: AROM;Both;10 reps;Seated Heel Slides: AAROM;Both;10 reps;Seated   PT Goals Acute Rehab PT Goals PT Goal: Sit to Stand - Progress: Progressing toward goal PT Goal: Stand to Sit - Progress: Progressing toward goal PT Transfer Goal: Bed to Chair/Chair to Bed - Progress: Progressing toward goal Additional Goals PT Goal: Additional Goal #1 - Progress: Progressing toward goal  Visit Information  Last PT Received On: 06/30/11 Assistance Needed: +2 PT/OT Co-Evaluation/Treatment: Yes    Subjective Data  Subjective: " I will try anything."   Cognition  Overall Cognitive Status: Impaired Area of Impairment: Attention;Following commands;Memory Arousal/Alertness: Awake/alert Orientation Level: Oriented X4 / Intact Behavior During Session: [facility] for tasks performed Current Attention Level: Sustained Memory: Decreased recall of precautions Following Commands: Follows one step commands consistently    Balance  Static Sitting Balance Static Sitting - Level of Assistance: Not tested (comment) Static Standing Balance Static Standing - Balance Support: Bilateral upper extremity supported;During functional activity Static Standing - Level of Assistance: 1: +2 Total assist Static Standing - Comment/# of Minutes: pt = 40% in [place] plus for 15 seconds and 2 minutes.  End of Session PT - End of Session Equipment Utilized During Treatment: Back brace [place] Dec  PLUS) Activity Tolerance: Patient limited by fatigue Patient left: in chair;with  call bell/phone within reach;with family/visitor present Nurse Communication: Mobility status;Need for lift equipment;Precautions    INGOLD,Daryan Buell 06/30/2011, 12:03 PM [facility] Acute Rehabilitation [phone] (pager)

## 2011-06-30 NOTE — Progress Notes (Signed)
Name: Jean [name] MRN: [id number] DOB: 24-Mar-1928    LOS: 7  PCCM PROGRESS NOTE  Active Problems:  HYPOTHYROIDISM, UNSPECIFIED  HYPERTENSION  CORONARY ATHEROSCLEROSIS NATIVE CORONARY ARTERY  Status post lumbar surgery  Aortic injury, S/P vascular surgery  ARF (acute renal failure)  Delirium, acute   History of Present Illness: 78 y/o female admitted on 06/23/2011 for an elective L2-L3 lumbar surgery complicated by aortic injury requiring laparotomy and repair by Vascular surgery. During the procedure she lost an estimated 1800cc of blood and received 3400cc of LR, 1000cc NS, 500cc hetastarch, 600cc of Cell Saver, and 2 U PRBC.  Lines / Drains: Peripheral IV  Cultures: None  Antibiotics: Vanc 4/22 (perioperative vascular) x1  Tests / Events:  4/22 (Dr. Danielle Dess) Bilateral laminotomies and foraminotomies decompression L2-L3 with decompression of L2 and L3 nerve roots greater dissection then for simple interbody arthrodesis. Posterior interbody arthrodesis using peek spacers with autograft and allograft. Removal of previously placed hardware L3-L5, pedicle screw fixation L2-L3 with posterior lateral arthrodesis using allograft and autograft   4/22 (Dr. Imogene Burn) Exploratory celiotomy and repair of aorta  4/23 patient's UOP increased to 75 cc/hour 4/23 started metoprolol 100 mg bid due to elevated Bp. 4/24 started methyldopa 250 mg bid due to elevated Bp. 4/24 Ct-head: no acute intracranial abnormalities 4/26 started clear liquid diet 4/26 increased metoprolol dose  From 100 mg bid to home dose 150 mg bid.  Subjective:  No events overnight, no new complaints.   Vital Signs: Temp:  [98 F (36.7 C)-99.1 F (37.3 C)] 98.5 F (36.9 C) (04/29 0820) Pulse Rate:  [63-76] 74  (04/29 0956) Resp:  [14-20] 18  (04/29 0820) BP: (98-143)/(49-83) 142/61 mmHg (04/29 0956) SpO2:  [98 %-100 %] 100 % (04/29 0820) Weight:  [78 kg (171 lb 15.3 oz)] 78 kg (171 lb 15.3 oz) (04/29 0512) I/O last  3 completed shifts: In: 710 [P.O.:360; Other:350] Out: 770 [Urine:770]  Intake/Output Summary (Last 24 hours) at 06/30/11 1127 Last data filed at 06/30/11 1001  Gross per 24 hour  Intake      3 ml  Output    645 ml  Net   -642 ml   Physical Examination:  general: resting in bed, not in acute distress, responses appropriate HEENT: PERRL, EOMI, no scleral icterus Cardiac: S1/S2, RRR, No murmurs, gallops or rubs Pulm: clear anteriorly,   No rales, wheezing, rhonchi or rubs. Abd: Soft,  nondistended, has tenderness over the surgical site, no organomegaly, BS present Ext: No rashes or edema, 2+DP/PT pulse bilaterally. Has back pain Neuro: pleasantly alert but weak, seems oriented,    Ventilator settings: not on ventilator   Labs and Imaging:  Reviewed.  Please refer to the Assessment and Plan section for relevant results.  ASSESSMENT AND PLAN  NEUROLOGIC A:  Status post L2-L3 decompression. Fair pain control. Acute delirium. Has history of CVA without motor deficit.  Negative head CT this admission. P: -->  Hold home ASA and plavix -->  Fentanyl 25-50 q2h PRN pain -->  Follow neurosurgery recommendations -->  Continue Risperdal 0.5 mg PO qHS- May be able to stop soon but will defer to neuro. -->  OOBTC -->  OK for meds PO and clears  PULMONARY  A:  Atelectasis due to pain. Pulmonary vascular congestion.  Small bilateral pleural effusions. P: --> Incentive spirometry --> KVO --> No thoracentesis --> Will need gentle diureses if respiratory status becomes an issue.  CARDIOVASCULAR  Lab 06/23/11 2250  TROPONINI <0.30  LATICACIDVEN --  PROBNP --   A:  History of CAD and MI s/p stent. Intraoperative aortic injury.  Hypertension.   P: -->  Hold ASA and plavix for now until ok with neurosurgery and vascular surgery. -->  Metoprolol per home regimen (150 mg PO bid). -->  Methyldopa per home regimen.  RENAL  Lab 06/30/11 0409 06/29/11 0340 06/28/11 0336 06/27/11 0610  06/26/11 1418 06/26/11 0410  NA 133* 135 137 139 -- 137  K 3.2* 3.2* -- -- -- --  CL 100 102 105 111 -- 110  CO2 23 22 22 20  -- 18*  BUN 26* 27* 27* 27* -- 30*  CREATININE 1.44* 1.46* 1.52* 1.46* -- 1.66*  CALCIUM 8.5 8.2* 8.4 8.2* -- 8.1*  MG -- -- -- 1.6 1.6 --  PHOS -- -- -- -- -- --   Positive fluid balance (9.3 L since admission to ICU).  A:  Acute on chronic renal failure, likely prerenal vs transient hypoperfusion, improving. P: --> Single dose of lasix today. --> K replacement. --> AM Labs. --> KVO. --> BMP in AM again tomorrow- watching K.  GASTROINTESTINAL A:  No acute issue P:  Protonix for GI Px  HEMATOLOGIC  Lab 06/30/11 0409 06/29/11 0340 06/28/11 0336 06/27/11 1946 06/27/11 0610 06/23/11 2250  HGB 11.2* 11.7* 11.8* 11.8* 8.0* --  HCT 32.7* 32.6* 33.8* 34.4* 23.8* --  PLT 123* 107* 93* 84* 85* --  INR -- -- -- -- -- 1.29  APTT -- -- -- -- -- --    A:   Anemia and thrombocytopenia likely due to blood loss and dilution. Less likely due to aortic bleeding per Vascular surgeon. P: --> Transfuse per vascular's HGB goals  INFECTIOUS  Lab 06/30/11 0409 06/29/11 0340 06/28/11 0336 06/27/11 1946 06/27/11 0610  WBC 7.9 7.8 8.1 7.7 8.1  PROCALCITON -- -- -- -- --   A:  No signs of infection, leukocytosis resolved P: -->  CBC in AM  ENDOCRINE  Lab 06/23/11 2147  GLUCAP 148*  TSH 2.67.  A:  No DM. H/o hypothyrodism.  P: -->  Continue synthroid  Alyson Reedy, M.D. [facility] Pulmonary/Critical Care Medicine. Pager: [phone]. After hours pager: [phone].

## 2011-06-30 NOTE — Progress Notes (Signed)
Patient ID: Jean [name], female   DOB: 10-May-1928, 78 y.o.   MRN: [id number] Alert but demonstrates weakness and lack of spontaneity with upper extremity on left. Feels fair but seems extremely weak. Discussed with family. Remote hx of cva was apparently silent. Will obtain mri Brain.

## 2011-06-30 NOTE — Progress Notes (Signed)
Occupational Therapy Treatment Patient Details Name: Jean [name] MRN: [id number] DOB: 05/01/1928 Today's Date: 06/30/2011 Time: 7829-5621 OT Time Calculation (min): 32 min  OT Assessment / Plan / Recommendation Comments on Treatment Session Pt continues to be very weak (especially on Left side), but is less lethargic and more participatory today.     Follow Up Recommendations  Inpatient Rehab    Equipment Recommendations  Defer to next venue         Plan Discharge plan remains appropriate    Precautions / Restrictions Precautions Precautions: Fall;Back Precaution Comments: Reviewed precautions with pt, she cannot recall any Required Braces or Orthoses: Spinal Brace Spinal Brace: Lumbar corset;Applied in sitting position Restrictions Weight Bearing Restrictions: No   Pertinent Vitals/Pain C/o of low back pain when standing first time, belt adjusted and pt no longer reported pain    ADL  ADL Comments: Educated pt on back precautions as pt unable to recall any at beginning of session. Family present. Noted pt with LUE weakness- pt with < 1/2 ROM with shoulder flexion, unable to reach face and with 2-/5 grasp. Inc edema also noted in LUE. Pt and family encouraged to perfrom AA/ROM to left hand and keep elevated on pillow to dec edema    OT Goals ADL Goals ADL Goal: Toileting - Hygiene - Progress: Progressing toward goals ADL Goal: Additional Goal #1 - Progress: Progressing toward goals  Visit Information  Last OT Received On: 06/30/11 Assistance Needed: +2 PT/OT Co-Evaluation/Treatment: Yes    Subjective Data   Are they football players? Referring to trees in painting         Cognition  Overall Cognitive Status: Impaired Area of Impairment: Attention;Following commands;Memory Arousal/Alertness: Awake/alert Orientation Level: Oriented X4 / Intact Behavior During Session: [facility] for tasks performed Current Attention Level: Sustained Memory: Decreased recall of  precautions Following Commands: Follows one step commands consistently    Mobility Bed Mobility Bed Mobility: Not assessed Rolling Right: Not tested (comment) Right Sidelying to Sit: Not tested (comment) Sitting - Scoot to Edge of Bed: Not tested (comment) Sit to Sidelying Right: Not Tested (comment) Scooting to [facility]: Not tested (comment) Details for Bed Mobility Assistance: Up in chair on arrival Transfers Sit to Stand: 1: +2 Total assist;With upper extremity assist;From chair/3-in-1 Sit to Stand: Patient Percentage: 40% Stand to Sit: 1: +2 Total assist;With upper extremity assist;To chair/3-in-1 Stand to Sit: Patient Percentage: 40% Transfer via Lift Equipment: Hydrographic surveyor Details for Transfer Assistance: Pt. continues to demonstrate <3/5 strength, therefore decided to try [employer] with footplate.  Patient was able to stand 2 x for 15 seconds and then 2 minutes.  Pt. kept slightly crouched gait but did demonstrate some postural control in [place] plus with abliltiy to extend hips and trunk for semi-erect posture.  Crouched at hips and knees slightly and needed a lot of encouragement and facilitation to extend neck as she kept neck flexed throughtout treatment.     Exercises Total Joint Exercises Ankle Circles/Pumps: AROM;Both;10 reps;Seated Quad Sets: AROM;Both;10 reps;Seated Gluteal Sets: AROM;Both;10 reps;Seated Heel Slides: AAROM;Both;10 reps;Seated  Balance Static Sitting Balance Static Sitting - Level of Assistance: Not tested (comment) Static Standing Balance Static Standing - Balance Support: Bilateral upper extremity supported;During functional activity Static Standing - Level of Assistance: 1: +2 Total assist Static Standing - Comment/# of Minutes: pt = 40% in [place] plus for 15 seconds and 2 minutes.  End of Session     Lynasia Meloche 06/30/2011, 1:51 PM

## 2011-06-30 NOTE — Progress Notes (Signed)
INITIAL ADULT NUTRITION ASSESSMENT Date: 06/30/2011   Time: 9:28 AM  Reason for Assessment: Prolonged NPO/CL Diet Order  ASSESSMENT: Female 78 y.o.  Dx: Decompression and fusion of L2-3 secondary to progressive kyphoscoliosis above fusion at L3-5  Hx:  Past Medical History  Diagnosis Date  . Hyperlipidemia   . Thyroid disease     hypo  . CHF (congestive heart failure)   . Mitral regurgitation   . Tricuspid regurgitation   . Anemia   . UTI (lower urinary tract infection)   . Arthritis     Osteoarthtistis  . Elevated glucose   . CVA (cerebral infarction)   . Vitamin d deficiency   . Hypothyroidism   . Myocardial infarction   . Chronic kidney failure     stage 3  . CAD (coronary artery disease)     sees Dr. Ricka Burdock last 03/2011  . Hypertension     Dr. Eden Emms cardiologist, Dr. Cheron Schaumann primary doctor   Past Surgical History  Procedure Date  . Abdominal hysterectomy   . Back surgery   . Coronary stent placement   . Laparotomy 06/23/2011    Procedure: EXPLORATORY LAPAROTOMY;  Surgeon: Barnett Abu, MD;  Location: [facility];  Service: Neurosurgery;  Laterality: Bilateral;   Related Meds:     . antiseptic oral rinse  15 mL Mouth Rinse q12n4p  . levothyroxine  75 mcg Oral QAC breakfast  . methyldopa  250 mg Oral BID  . metoprolol tartrate  150 mg Oral BID  . pantoprazole  40 mg Oral Q1200  . risperiDONE  0.5 mg Oral QHS  . sodium chloride  3 mL Intravenous Q12H   Ht: 5\' 7"  (170.2 cm)  Wt: 171 lb 15.3 oz (78 kg)  Ideal Wt: 61.4 kg % Ideal Wt: 127%  Wt Readings from Last 15 Encounters:  06/30/11 171 lb 15.3 oz (78 kg)  06/30/11 171 lb 15.3 oz (78 kg)  06/30/11 171 lb 15.3 oz (78 kg)  06/20/11 153 lb (69.4 kg)  06/04/11 148 lb (67.132 kg)  05/16/11 152 lb 9.6 oz (69.219 kg)  03/05/11 151 lb (68.493 kg)  12/25/10 150 lb (68.04 kg)  08/19/10 146 lb (66.225 kg)  07/31/10 147 lb 6.4 oz (66.86 kg)  06/27/10 149 lb 1.9 oz (67.64 kg)  05/16/10 147 lb 8 oz  (66.906 kg)  05/14/10 219 lb (99.338 kg)  03/14/10 143 lb (64.864 kg)  01/15/10 144 lb (65.318 kg)  Usual Wt: 145 - 150 lb % Usual Wt: 114%  Body mass index is 26.93 kg/(m^2). Pt is overweight.  Food/Nutrition Related Hx: Regular diet PTA  Labs:  CMP     Component Value Date/Time   NA 133* 06/30/2011 0409   K 3.2* 06/30/2011 0409   CL 100 06/30/2011 0409   CO2 23 06/30/2011 0409   GLUCOSE 106* 06/30/2011 0409   GLUCOSE 100* 01/30/2006 1630   BUN 26* 06/30/2011 0409   CREATININE 1.44* 06/30/2011 0409   CALCIUM 8.5 06/30/2011 0409   CALCIUM 9.5 08/18/2006 0105   PROT 7.4 12/23/2010 0739   ALBUMIN 3.9 12/23/2010 0739   AST 29 12/23/2010 0739   ALT 15 12/23/2010 0739   ALKPHOS 73 12/23/2010 0739   BILITOT 0.6 12/23/2010 0739   GFRNONAA 33* 06/30/2011 0409   GFRAA 38* 06/30/2011 0409   CBG (last 3)  No results found for this basename: GLUCAP:3 in the last 72 hours   Intake/Output Summary (Last 24 hours) at 06/30/11 0931 Last data filed at 06/30/11 1610  Gross per 24 hour  Intake      0 ml  Output    645 ml  Net   -645 ml    Diet Order: Clear Liquid  Supplements/Tube Feeding: none  IVF:    Estimated Nutritional Needs:   Kcal:  1600 - 1800 kcal Protein:  80 - 90 grams Fluid: 1.8 - 2 L/d  Pt is currently s/p POD #7 s/p exp lap, posterior lumbar fusion 1 with hardware removal, and abdominal exposure.  Per diet order history, pt has been on clear liquids since admission in efforts for bowel rest and anticipation of return of bowel function.  Pt agreeable to Resource Breeze to better meet protein needs. Discussed role of RD and encouraged intake of protein-rich foods with diet advancement.  Pt at nutrition risk given prolonged suboptimal protein intake, advanced age and recent acute injury/surgery.  NUTRITION DIAGNOSIS: -Inadequate protein energy intake (NI-5.3).  Status: Ongoing  RELATED TO: inability to advance diet beyond clear liquids  AS EVIDENCE BY: clear liquid  diet provision  MONITORING/EVALUATION(Goals): Goal: Pt to advance diet as tolerated. Intake to meet >/= 90% of estimated needs. Monitor: PO Intake, diet advancement, weights, labs, I/O's  EDUCATION NEEDS: -No education needs identified at this time  INTERVENTION: 1. Resource Breeze PO BID for additional protein and kcal. 2. RD to continue to follow nutrition care plan  Dietitian #: [phone]  DOCUMENTATION CODES Per approved criteria  -Not Applicable   Adair Laundry 06/30/2011, 9:28 AM

## 2011-06-30 NOTE — Progress Notes (Signed)
Utilization review completed. Anette Guarneri, RN, BSN. 06/30/11

## 2011-06-30 NOTE — Progress Notes (Signed)
[facility]  Daily Progress Note  Assessment/Planning: POD #7 s/p Xlap, Aortic repair   Continue clears until full return of bowel function  Staples out on POD#14  Subjective  - 7 Days Post-Op  No complaints, denies flatus or BM  Objective Filed Vitals:   06/30/11 0400 06/30/11 0500 06/30/11 0512 06/30/11 0747  BP: 98/83 139/63  143/54  Pulse: 64 72 65 63  Temp:   98.3 F (36.8 C)   TempSrc:   Oral Oral  Resp: 15 19 14 15   Height:      Weight:   171 lb 15.3 oz (78 kg)   SpO2:   98% 100%    Intake/Output Summary (Last 24 hours) at 06/30/11 0756 Last data filed at 06/30/11 0536  Gross per 24 hour  Intake    360 ml  Output    770 ml  Net   -410 ml    PULM  CTAB CV  RRR GI  soft, NTND, inc c/d/i, staples in place VASC  B feet warm and well perfused  Laboratory CBC    Component Value Date/Time   WBC 7.9 06/30/2011 0409   HGB 11.2* 06/30/2011 0409   HCT 32.7* 06/30/2011 0409   PLT 123* 06/30/2011 0409    BMET    Component Value Date/Time   NA 133* 06/30/2011 0409   K 3.2* 06/30/2011 0409   CL 100 06/30/2011 0409   CO2 23 06/30/2011 0409   GLUCOSE 106* 06/30/2011 0409   GLUCOSE 100* 01/30/2006 1630   BUN 26* 06/30/2011 0409   CREATININE 1.44* 06/30/2011 0409   CALCIUM 8.5 06/30/2011 0409   CALCIUM 9.5 08/18/2006 0105   GFRNONAA 33* 06/30/2011 0409   GFRAA 38* 06/30/2011 0409    Leonides Sake, MD [facility] Office: [phone] Pager: [phone]  06/30/2011, 7:56 AM

## 2011-07-01 ENCOUNTER — Inpatient Hospital Stay (HOSPITAL_COMMUNITY): Payer: Medicare Other

## 2011-07-01 LAB — BASIC METABOLIC PANEL
BUN: 28 mg/dL — ABNORMAL HIGH (ref 6–23)
Calcium: 8.6 mg/dL (ref 8.4–10.5)
Chloride: 102 mEq/L (ref 96–112)
Creatinine, Ser: 1.53 mg/dL — ABNORMAL HIGH (ref 0.50–1.10)
GFR calc Af Amer: 36 mL/min — ABNORMAL LOW (ref >90)
GFR calc non Af Amer: 31 mL/min — ABNORMAL LOW (ref >90)

## 2011-07-01 LAB — HEMOGLOBIN A1C: Mean Plasma Glucose: 117 mg/dL — ABNORMAL HIGH (ref <117)

## 2011-07-01 LAB — MAGNESIUM: Magnesium: 1.6 mg/dL (ref 1.5–2.5)

## 2011-07-01 MED ORDER — ASPIRIN 81 MG PO CHEW
81.0000 mg | CHEWABLE_TABLET | Freq: Every day | ORAL | Status: DC
  Administered 2011-07-01 – 2011-07-02 (×2): 81 mg via ORAL
  Filled 2011-07-01 (×2): qty 1

## 2011-07-01 MED ORDER — BISACODYL 10 MG RE SUPP
10.0000 mg | Freq: Once | RECTAL | Status: DC
  Filled 2011-07-01: qty 1

## 2011-07-01 MED ORDER — CLOPIDOGREL BISULFATE 75 MG PO TABS
75.0000 mg | ORAL_TABLET | Freq: Every day | ORAL | Status: DC
  Administered 2011-07-02: 75 mg via ORAL
  Filled 2011-07-01 (×2): qty 1

## 2011-07-01 NOTE — Progress Notes (Signed)
Rehab admissions - Evaluated for possible admission.  I spoke with patient, husband, dtr and son-in-law in room.  Dtr can assist and husband can provide 24 hr supervision and assist after rehab stay.  I would like to see how she does over the next 24 to 48 hours.  I anticipate admission to inpatient rehab in the next 1-3 days.  Call me for questions.  #[phone]

## 2011-07-01 NOTE — Progress Notes (Signed)
Physical Therapy Treatment Patient Details Name: Jean [name] MRN: [id number] DOB: [birth year] Today's Date: 07/01/2011 Time: 2595-6387 PT Time Calculation (min): 33 min  PT Assessment / Plan / Recommendation Comments on Treatment Session  Patient s/p back surgery and aorta repair. Also with watershed infarct post surgery. Continues to be limited by her weakness and poor endurance for activity.  Hopeful that she can continue to progress and go to inpatient rehab.    Follow Up Recommendations  Inpatient Rehab    Equipment Recommendations  Defer to next venue    Frequency Min 5X/week   Plan Discharge plan remains appropriate;Frequency remains appropriate    Precautions / Restrictions Precautions Precautions: Fall;Back Precaution Comments: Reviewed back precautions but patient has difficulty with recall Required Braces or Orthoses: Spinal Brace Spinal Brace: Lumbar corset;Applied in supine position Restrictions Weight Bearing Restrictions: No   Pertinent Vitals/Pain VSS/ Some pain    Mobility  Bed Mobility Bed Mobility: Rolling Right;Rolling Left;Right Sidelying to Sit;Sitting - Scoot to [place] of Bed Rolling Right: 3: Mod assist;With rail Rolling Left: 3: Mod assist;With rail Right Sidelying to Sit: 1: +2 Total assist;With rails;HOB flat Right Sidelying to Sit: Patient Percentage: 60% Sitting - Scoot to Edge of Bed: 4: Min assist Sit to Sidelying Right: Not Tested (comment) Scooting to [facility]: Not tested (comment) Details for Bed Mobility Assistance: Patient needed cues for technique for log roll and assist to achieve log roll.   Transfers Transfers: Sit to Stand;Stand to Sit;Stand Pivot Transfers Sit to Stand: 1: +2 Total assist;With upper extremity assist;From bed;With armrests Sit to Stand: Patient Percentage: 50% (in [place] plus) Stand to Sit: 1: +2 Total assist;With upper extremity assist;With armrests;To chair/3-in-1 (in [place] Plus) Stand to Sit: Patient Percentage:  50% Stand Pivot Transfers: 1: +2 Total assist;With armrests Stand Pivot Transfers: Patient Percentage: 60% Transfer via Lift Equipment: Hydrographic surveyor Details for Transfer Assistance: USed [place] plus with footplate.  Patient was able to stand x2 for 2 minutes with good upright posture.  Used [place] plus to transfer patient to 3N1.  Patient unsuccessful on 3N1.  Assisted patient to chair after that in which patient stood about 30 seconds but was fatiguing and did nor stand fully upright like before with somewhat crouched gait.  Overall improving postural stabiltiy.  Ambulation/Gait Ambulation/Gait Assistance: Not tested (comment) Stairs: No Wheelchair Mobility Wheelchair Mobility: No         PT Goals Acute Rehab PT Goals PT Goal Formulation: With patient/family PT Goal: Rolling Supine to Right Side - Progress: Progressing toward goal PT Goal: Supine/Side to Sit - Progress: Progressing toward goal PT Goal: Sit to Stand - Progress: Progressing toward goal PT Goal: Stand to Sit - Progress: Progressing toward goal PT Transfer Goal: Bed to Chair/Chair to Bed - Progress: Progressing toward goal Additional Goals PT Goal: Additional Goal #1 - Progress: Progressing toward goal  Visit Information  Last PT Received On: 07/01/11 Assistance Needed: +2    Subjective Data  Subjective: "I want to try."   Cognition  Overall Cognitive Status: Impaired Area of Impairment: Attention;Following commands;Memory Arousal/Alertness: Awake/alert Orientation Level: Oriented X4 / Intact Behavior During Session: [facility] for tasks performed Current Attention Level: Sustained Memory: Decreased recall of precautions Following Commands: Follows one step commands consistently    Balance  Static Sitting Balance Static Sitting - Balance Support: Bilateral upper extremity supported;Feet supported Static Sitting - Level of Assistance: 2: Max assist Static Sitting - Comment/# of Minutes: Patient had difficulty sitting EOB.   Significant posterior lean.  Needed max assist.  Had difficulty using UEs even with max cues.  Sat total of 3 minutes.   Static Standing Balance Static Standing - Balance Support: Bilateral upper extremity supported;During functional activity Static Standing - Level of Assistance: 1: +2 Total assist Static Standing - Comment/# of Minutes: pt = 62 in [place] plus for 2 minutes and 30 seconds.  End of Session PT - End of Session Equipment Utilized During Treatment: Gait belt;Back brace [place] Dec plus) Activity Tolerance: Patient limited by fatigue Patient left: in chair;with call bell/phone within reach Nurse Communication: Mobility status;Need for lift equipment    INGOLD,Milaina Sher 07/01/2011, 3:05 PM [facility] Acute Rehabilitation [phone] (pager)

## 2011-07-01 NOTE — Progress Notes (Signed)
Name: Jean [name] MRN: [id number] DOB: Jul 28, 1928    LOS: 8  PCCM PROGRESS NOTE  Active Problems:  HYPOTHYROIDISM, UNSPECIFIED  HYPERTENSION  CORONARY ATHEROSCLEROSIS NATIVE CORONARY ARTERY  Status post lumbar surgery  Aortic injury, S/P vascular surgery  ARF (acute renal failure)  Delirium, acute  Pulmonary edema  Hypokalemia   History of Present Illness: 78 y/o female admitted on 06/23/2011 for an elective L2-L3 lumbar surgery complicated by aortic injury requiring laparotomy and repair by Vascular surgery. During the procedure she lost an estimated 1800cc of blood and received 3400cc of LR, 1000cc NS, 500cc hetastarch, 600cc of Cell Saver, and 2 U PRBC.  Lines / Drains: Peripheral IV  Cultures: None  Antibiotics: Vanc 4/22 (perioperative vascular) x1  Tests / Events:  4/22 (Dr. Danielle Dess) Bilateral laminotomies and foraminotomies decompression L2-L3 with decompression of L2 and L3 nerve roots greater dissection then for simple interbody arthrodesis. Posterior interbody arthrodesis using peek spacers with autograft and allograft. Removal of previously placed hardware L3-L5, pedicle screw fixation L2-L3 with posterior lateral arthrodesis using allograft and autograft   4/22 (Dr. Imogene Burn) Exploratory celiotomy and repair of aorta  4/23 patient's UOP increased to 75 cc/hour 4/23 started metoprolol 100 mg bid due to elevated Bp. 4/24 started methyldopa 250 mg bid due to elevated Bp. 4/24 Ct-head: no acute intracranial abnormalities 4/26 started clear liquid diet 4/26 increased metoprolol dose  From 100 mg bid to home dose 150 mg bid.  Subjective:  No events overnight, no new complaints.   Vital Signs: Temp:  [97.5 F (36.4 C)-98.3 F (36.8 C)] 97.8 F (36.6 C) (04/30 0827) Pulse Rate:  [58-73] 73  (04/30 0926) Resp:  [14-18] 16  (04/30 0827) BP: (97-158)/(39-76) 153/76 mmHg (04/30 0926) SpO2:  [97 %-100 %] 100 % (04/30 0827) Weight:  [78.7 kg (173 lb 8 oz)] 78.7 kg (173  lb 8 oz) (04/30 0000) I/O last 3 completed shifts: In: 178 [P.O.:175; I.V.:3] Out: 2425 [Urine:2425]  Intake/Output Summary (Last 24 hours) at 07/01/11 1102 Last data filed at 07/01/11 0828  Gross per 24 hour  Intake    175 ml  Output   2300 ml  Net  -2125 ml   Physical Examination:  general: resting in bed, not in acute distress, responses appropriate HEENT: PERRL, EOMI, no scleral icterus Cardiac: S1/S2, RRR, No murmurs, gallops or rubs Pulm: clear anteriorly,   No rales, wheezing, rhonchi or rubs. Abd: Soft,  nondistended, has tenderness over the surgical site, no organomegaly, BS present Ext: No rashes or edema, 2+DP/PT pulse bilaterally. Has back pain Neuro: pleasantly alert but weak, seems oriented,    Ventilator settings: not on ventilator   Labs and Imaging:  Reviewed.  Please refer to the Assessment and Plan section for relevant results.  ASSESSMENT AND PLAN  NEUROLOGIC A:  Status post L2-L3 decompression. Fair pain control. Acute delirium. Has history of CVA without motor deficit.  Negative head CT this admission. P: -->  Hold home ASA and plavix. -->  Fentanyl 25-50 q2h PRN pain. -->  Follow neurosurgery recommendations. -->  Continue Risperdal 0.5 mg PO qHS- May be able to stop soon but will defer to neuro. -->  OOBTC as tolerated, PT/OT. -->  OK for meds PO and clears.  PULMONARY A:  Atelectasis due to pain. Pulmonary vascular congestion.  Small bilateral pleural effusions. P: --> Incentive spirometry --> KVO --> No thoracentesis necessary. --> Will need gentle diureses if respiratory status becomes an issue.  Would let fluid status accumulate.  CARDIOVASCULAR No results found for this basename: TROPONINI:5,LATICACIDVEN:5, O2SATVEN:5,PROBNP:5 in the last 168 hours A:  History of CAD and MI s/p stent. Intraoperative aortic injury.  Hypertension.   P: -->  Hold ASA and plavix for now until ok with neurosurgery and vascular surgery. -->  Metoprolol per  home regimen (150 mg PO bid). -->  Methyldopa per home regimen.  RENAL  Lab 07/01/11 0400 06/30/11 0409 06/29/11 0340 06/28/11 0336 06/27/11 0610 06/26/11 1418  NA 135 133* 135 137 139 --  K 3.8 3.2* -- -- -- --  CL 102 100 102 105 111 --  CO2 23 23 22 22 20  --  BUN 28* 26* 27* 27* 27* --  CREATININE 1.53* 1.44* 1.46* 1.52* 1.46* --  CALCIUM 8.6 8.5 8.2* 8.4 8.2* --  MG 1.6 -- -- -- 1.6 1.6  PHOS 3.1 -- -- -- -- --   Positive fluid balance (9.3 L since admission to ICU).  A:  Acute on chronic renal failure, likely prerenal vs transient hypoperfusion, improving. P: --> Single dose of lasix today. --> K replacement. --> AM Labs. --> KVO. --> BMP in AM again tomorrow- watching K.  GASTROINTESTINAL A:  No acute issue P:  Protonix for GI Px  HEMATOLOGIC  Lab 06/30/11 0409 06/29/11 0340 06/28/11 0336 06/27/11 1946 06/27/11 0610  HGB 11.2* 11.7* 11.8* 11.8* 8.0*  HCT 32.7* 32.6* 33.8* 34.4* 23.8*  PLT 123* 107* 93* 84* 85*  INR -- -- -- -- --  APTT -- -- -- -- --    A:   Anemia and thrombocytopenia likely due to blood loss and dilution. Less likely due to aortic bleeding per Vascular surgeon. P: --> Transfuse per vascular's HGB goals  INFECTIOUS  Lab 06/30/11 0409 06/29/11 0340 06/28/11 0336 06/27/11 1946 06/27/11 0610  WBC 7.9 7.8 8.1 7.7 8.1  PROCALCITON -- -- -- -- --   A:  No signs of infection, leukocytosis resolved P: -->  CBC in AM  ENDOCRINE No results found for this basename: GLUCAP:5 in the last 168 hoursTSH 2.67.  A:  No DM. H/o hypothyrodism.  P: -->  Continue synthroid  PCCM will sign off at this point, would recommend consulting hospitalist for medical management.  Please call back if needed.  Alyson Reedy, M.D. [facility] Pulmonary/Critical Care Medicine. Pager: [phone]. After hours pager: [phone].

## 2011-07-01 NOTE — PMR Pre-admission (Signed)
PMR Admission Coordinator Pre-Admission Assessment  Patient: Jean [name] is an 78 y.o., female MRN: [id number] DOB: 12-05-1928 Height: 5' 7.5" (171.5 cm) Weight: 77.8 kg (171 lb 8.3 oz)  Insurance Information HMO:    PPO:       PCP:       IPA:       80/20:       OTHER:   PRIMARY: Medicare A/B      Policy#: [id number]      Subscriber: Charlyn Minerva CM Name:        Phone#:       Fax#:   Pre-Cert#:        Employer: Retired Benefits:  Phone #:       Name: Armed forces training and education officer. Date: 04/04/95     Deduct: $1184      Out of Pocket NWG:NFAO        Life Max: unlimited CIR: 100%      SNF: 100 days  LBD = 11/22/09 Outpatient: 80%     Co-Pay: 20% Home Health: 100%      Co-Pay: none DME: 80%     Co-Pay: 20% Providers: patient's choice  SECONDARY: State BCBS      Policy#:      Subscriber:  CM Name:       Phone#:      Fax#:  Pre-Cert#:       Employer:  Benefits:  Phone #:      Name:  Eff. Date:      Deduct:       Out of Pocket Max:       Life Max:  CIR:       SNF:  Outpatient:      Co-Pay:  Home Health:       Co-Pay:  DME:      Co-Pay:   Emergency Contact Information Contact Information    Name Relation Home Work Mobile   [place] Spouse [phone]      Current Medical History  Patient Admitting Diagnosis: Lumbar stenosis w/radiculopathy with decompression, with acute/remote Watershed CVA   History of Present Illness:  78 year old right-handed female with history of back surgery 2005. Presented 06/23/2011 with low back pain radiating to the left lower extremity. X-ray and imaging revealed lumbar L2-3 stenosis, kyphoscoliosis and radiculopathy. Underwent bilateral laminotomies and foraminotomies decompression lumbar L2-3 with decompression of lumbar 21 III nerve roots as well as removal of previously placed hardware lumbar L3-5, pedicle screw fixation lumbar 2-3 per Dr. Danielle Dess 06/23/2011. Patient developed acute arterial bleeding during the case with vascular surgery consulted  to Dr. Imogene Burn and underwent repair of avulsed lumbar artery. During the procedure she lost an estimated 1800 cc of blood and did receive 2 units of packed red blood cells. Patient with chronic renal insufficiency creatinine 1.5-1.8 and monitored with latest creatinine 1.44 on 06/30/2011. She presently remains off of her aspirin and Plavix therapy as prior to admission. Therapies have been initiated she is wearing a back corset when out of bed noted some bouts of confusion.  Neurology following.  MRI showed multiple acute/subacute non hemorrhagic small infarcts in watershed distribution.    Past Medical History  Past Medical History  Diagnosis Date  . Hyperlipidemia   . Thyroid disease     hypo  . CHF (congestive heart failure)   . Mitral regurgitation   . Tricuspid regurgitation   . Anemia   . UTI (lower urinary tract infection)   . Arthritis     Osteoarthtistis  .  Elevated glucose   . CVA (cerebral infarction)   . Vitamin d deficiency   . Hypothyroidism   . Myocardial infarction   . Chronic kidney failure     stage 3  . CAD (coronary artery disease)     sees Dr. Ricka Burdock last 03/2011  . Hypertension     Dr. Eden Emms cardiologist, Dr. Cheron Schaumann primary doctor    Family History  family history includes Aneurysm in her mother; Heart attack in her father; Heart disease in her mother; and Hypertension in her father.  There is no history of Anesthesia problems.  Prior Rehab/Hospitalizations:  Had outpatient therapy for back on [facility].  Current Medications  Current facility-administered medications:acetaminophen (TYLENOL) suppository 650 mg, 650 mg, Rectal, Q4H PRN, Barnett Abu, MD;  acetaminophen (TYLENOL) tablet 650 mg, 650 mg, Oral, Q4H PRN, Barnett Abu, MD;  antiseptic oral rinse (BIOTENE) solution 15 mL, 15 mL, Mouth Rinse, q12n4p, Lupita Leash, MD, 15 mL at 07/01/11 1600;  aspirin chewable tablet 81 mg, 81 mg, Oral, Daily, Barnett Abu, MD, 81 mg at 07/01/11 1355 bisacodyl  (DULCOLAX) suppository 10 mg, 10 mg, Rectal, Once, Regina J Buffalo, PA;  bisacodyl (DULCOLAX) suppository 10 mg, 10 mg, Rectal, Daily PRN, Fransisco Hertz, MD;  clopidogrel (PLAVIX) tablet 75 mg, 75 mg, Oral, Q breakfast, Barnett Abu, MD, 75 mg at 07/02/11 6213;  feeding supplement (RESOURCE BREEZE) liquid 1 Container, 1 Container, Oral, BID BM, Haynes Bast, RD, 1 Container at 07/01/11 1355 fentaNYL (SUBLIMAZE) injection 25-50 mcg, 25-50 mcg, Intravenous, Q2H PRN, Alanson Puls, MD, 50 mcg at 07/01/11 1713;  levothyroxine (SYNTHROID, LEVOTHROID) tablet 75 mcg, 75 mcg, Oral, QAC breakfast, Barnett Abu, MD, 75 mcg at 07/02/11 0851;  menthol-cetylpyridinium (CEPACOL) lozenge 3 mg, 1 lozenge, Oral, PRN, Barnett Abu, MD;  methyldopa (ALDOMET) tablet 250 mg, 250 mg, Oral, BID, Lorretta Harp, MD, 250 mg at 07/01/11 2126 metoprolol (LOPRESSOR) tablet 150 mg, 150 mg, Oral, BID, Alanson Puls, MD, 150 mg at 07/01/11 2126;  nitroGLYCERIN (NITROSTAT) SL tablet 0.4 mg, 0.4 mg, Sublingual, Q5 min PRN, Barnett Abu, MD;  ondansetron [facility]) injection 4 mg, 4 mg, Intravenous, Q4H PRN, Barnett Abu, MD;  pantoprazole (PROTONIX) EC tablet 40 mg, 40 mg, Oral, Q1200, Barnett Abu, MD, 40 mg at 07/01/11 1300 phenol (CHLORASEPTIC) mouth spray 1 spray, 1 spray, Mouth/Throat, PRN, Barnett Abu, MD;  risperiDONE (RISPERDAL) tablet 0.5 mg, 0.5 mg, Oral, QHS, Konstantin Zubelevitskiy, MD, 0.5 mg at 07/01/11 2126;  senna-docusate (Senokot-S) tablet 1 tablet, 1 tablet, Oral, BID, Fransisco Hertz, MD;  sodium chloride 0.9 % injection 3 mL, 3 mL, Intravenous, Q12H, Barnett Abu, MD, 3 mL at 07/01/11 2126 sodium chloride 0.9 % injection 3 mL, 3 mL, Intravenous, PRN, Barnett Abu, MD;  sodium phosphate (FLEET) 7-19 GM/118ML enema 1 enema, 1 enema, Rectal, Daily PRN, Fransisco Hertz, MD  Patients Current Diet: General  Precautions / Restrictions Precautions Precautions: Fall;Back Precaution Booklet Issued: No Precaution Comments:  Continues with difficulty with recall of back precautions. Spinal Brace: Lumbar corset;Applied in supine position Restrictions Weight Bearing Restrictions: No   Prior Activity Level Community (5-7x/wk): Went out daily.  Home Assistive Devices / Equipment Home Assistive Devices/Equipment: Dan Humphreys (specify type);Cane (specify quad or straight);Grab bars around toilet;Raised toilet seat with rails Home Adaptive Equipment: Walker - standard;Walker - rolling  Prior Functional Level Prior Function Level of Independence: Needs assistance Needs Assistance: Light Housekeeping;Meal Prep Meal Prep: Other (comment) Light Housekeeping: Moderate Able to Take Stairs?: Yes Driving: No Vocation:  Retired Comments: Per spouse pt was able to perform basic ADLs, transfers and gait without assist  Current Functional Level Cognition  Arousal/Alertness: Awake/alert Overall Cognitive Status: Impaired Current Attention Level: Sustained Attention - Other Comments: Continues to be slow to respond verbally and motorically  Memory: Decreased recall of precautions Memory Deficits: pt unable to recall any precautions or situation Orientation Level: Oriented to person;Oriented to place;Oriented to situation;Oriented to time Following Commands: Follows one step commands consistently Cognition - Other Comments: Slow to respond.    Sensation       Coordination       ADLs  Eating/Feeding: Simulated;+1 Total assistance Where Assessed - Eating/Feeding: Chair Grooming: Simulated;Maximal assistance Where Assessed - Grooming: Supported sitting Upper Body Bathing: Simulated;+1 Total assistance Where Assessed - Upper Body Bathing: Sitting, chair Lower Body Bathing: Simulated;+2 Total assistance (+2 needed for sit to stand and to perform task) Where Assessed - Lower Body Bathing: Sit to stand from bed Upper Body Dressing: Performed;+1 Total assistance Where Assessed - Upper Body Dressing: Sitting, bed Lower  Body Dressing: Performed;+1 Total assistance (don socks) Where Assessed - Lower Body Dressing: Sitting, bed Toilet Transfer: Simulated;+2 Total assistance (pt=40%) Toilet Transfer Method: Stand pivot Toileting - Clothing Manipulation: Simulated;+1 Total assistance Where Assessed - Glass blower/designer Manipulation: Standing Toileting - Hygiene: Simulated;+1 Total assistance Where Assessed - Toileting Hygiene: Standing Tub/Shower Transfer: Not assessed Equipment Used: Back brace;Rolling walker Ambulation Related to ADLs: +2totalA (pt=40%) for stand-pivot from EOB to chair. Pt with kyphotic posture and attempting to sit throughout transfer despite VC and physical assist to remain standing upright. Also, manual facilitation to weight shift and advance bil LE ADL Comments: Educated pt on back precautions as pt unable to recall any at beginning of session. Family present. Noted pt with LUE weakness- pt with < 1/2 ROM with shoulder flexion, unable to reach face and with 2-/5 grasp. Inc edema also noted in LUE. Pt and family encouraged to perfrom AA/ROM to left hand and keep elevated on pillow to dec edema    Mobility  Bed Mobility: Rolling Right;Rolling Left;Right Sidelying to Sit;Sitting - Scoot to [place] of Bed Rolling Right: 3: Mod assist;With rail Rolling Right: Patient Percentage: 30% Rolling Left: 3: Mod assist;With rail Right Sidelying to Sit: 1: +2 Total assist;With rails;HOB elevated (HOB at 10Degrees) Right Sidelying to Sit: Patient Percentage: 60% Sitting - Scoot to Edge of Bed: 4: Min assist Sitting - Scoot to [place] of Bed: Patient Percentage: 20% Sit to Sidelying Right: Not Tested (comment) Sit to Sidelying Right: Patient Percentage: 20% Scooting to [facility]: Not tested (comment)    Transfers  Transfers: Sit to Stand;Stand to Sit;Stand Pivot Transfers Sit to Stand: 1: +2 Total assist;With upper extremity assist;With armrests;From bed Sit to Stand: Patient Percentage: 50% (in [place] Dec  Plus) Stand to Sit: 1: +2 Total assist;With upper extremity assist;With armrests;To chair/3-in-1 (in sara plus) Stand to Sit: Patient Percentage: 50% Stand Pivot Transfers: 1: +2 Total assist;With armrests Stand Pivot Transfers: Patient Percentage: 60% Transfer via Lift Equipment: Human resources officer / Gait / Stairs / Psychologist, prison and probation services  Ambulation/Gait Ambulation/Gait Assistance: Not tested (comment) Stairs: No Corporate treasurer: No    Posture / Games developer Sitting - Balance Support: Bilateral upper extremity supported;Feet supported Static Sitting - Level of Assistance: 2: Max assist Static Sitting - Comment/# of Minutes: Patient had difficulty sitting EOB.  Significant posterior lean continues.  Needed max assist for stability.  Sat a total of 2 minutes.  Static Standing Balance Static Standing - Balance Support: Bilateral upper extremity supported;During functional activity Static Standing - Level of Assistance: 1: +2 Total assist Static Standing - Comment/# of Minutes: pt = 60% in [place] plus for 4 minutes and then 1 minute     Previous Home Environment Living Arrangements: Spouse/significant other Lives With: Spouse Type of Home: House Home Layout: One level Home Access: Stairs to enter Entrance Stairs-Rails: None Secretary/administrator of Steps: 2 Bathroom Shower/Tub: Health visitor: Standard Home Care Services: No  Discharge Living Setting Plans for Discharge Living Setting: Patient's home;House (Lives with husband.) Type of Home at Discharge: House Discharge Home Layout: One level Discharge Home Access: Stairs to enter Entrance Stairs-Number of Steps: 2 at garage, 4 at front entry Do you have any problems obtaining your medications?: No  Social/Family/Support Systems Patient Roles: Spouse;Parent Contact Information: Wyona Neils - spouse (h) [phone] (c) [phone]; Pincus Large - Dtr (h) [phone]  [phone] Anticipated Caregiver: Husband and Dtr Ability/Limitations of Caregiver: Husband and Dtr retired and can assist, additional Dtr works. Caregiver Availability: 24/7 Discharge Plan Discussed with Primary Caregiver: Yes Is Caregiver In Agreement with Plan?: Yes Does Caregiver/Family have Issues with Lodging/Transportation while Pt is in Rehab?: No  Goals/Additional Needs Patient/Family Goal for Rehab: PT S/Min A, OT S to mod A goals Expected length of stay: 2-3 weeks Cultural Considerations: [employer] Dietary Needs: Regular diet, thin liquids Equipment Needs: TBD Pt/Family Agrees to Admission and willing to participate: Yes Program Orientation Provided & Reviewed with Pt/Caregiver Including Roles  & Responsibilities: Yes  Patient Condition: This patient's condition remains as documented in the Consult dated 06/30/11, in which the Rehabilitation Physician determined and documented that the patient's condition is appropriate for intensive rehabilitative care in an inpatient rehabilitation facility.  Preadmission Screen Completed By:  Oletta Darter, 07/02/2011 10:32 AM ______________________________________________________________________   Discussed status with Dr. Riley Kill on 07/02/11 at 1000 and received telephone approval for admission today.  Admission Coordinator:  Oletta Darter, time 10:37am /Date5/1/13

## 2011-07-01 NOTE — Progress Notes (Signed)
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 Danielle Dess  Time of first page:  1135  MD notified (2nd page):  Time of second page:  Responding MD:  Dr. Danielle Dess  Time MD responded:  MD came to see pt's family and spoke with them

## 2011-07-01 NOTE — Progress Notes (Signed)
Patient ID: Jean [name], female   DOB: [birth year], 78 y.o.   MRN: [id number] Alert, vital signs stable. Left upper extremity appears stronger today. A shunt does appear more animated.  MRI today demonstrates evidence of watershed infarct in addition to some 10. Also a 2.9 mm aneurysm at the anterior communicating area is located. I discussed these findings with the patient's family and the patient. I do not feel there's any specific treatment for the aneurysm but I would render as regards the strokes I believe that restarting the patient's Plavix and aspirin would be appropriate. I discussed the situation with Dr. Thad Ranger.  Continue supportive care restart Plavix and aspirin.

## 2011-07-01 NOTE — Progress Notes (Signed)
[facility]  Daily Progress Note  Assessment/Planning: POD #8 s/p xlap, aortic repair  Continue clears until full return of bowel function  KUB [employer] out on POD#14  Subjective  - 8 Days Post-Op  No complaints, denies flatus or BM, notes urge to go, improved appetite   Objective Filed Vitals:   07/01/11 0500 07/01/11 0600 07/01/11 0700 07/01/11 0827  BP:    158/63  Pulse: 61 58 63 73  Temp:    97.8 F (36.6 C)  TempSrc:    Oral  Resp: 16 15 16 16   Height:      Weight:      SpO2:    100%    Intake/Output Summary (Last 24 hours) at 07/01/11 0901 Last data filed at 07/01/11 1610  Gross per 24 hour  Intake    178 ml  Output   2300 ml  Net  -2122 ml    PULM  CTAB CV  RRR GI  soft, NT, distend, -G/R, NABS VASC  B feet warm  Laboratory CBC    Component Value Date/Time   WBC 7.9 06/30/2011 0409   HGB 11.2* 06/30/2011 0409   HCT 32.7* 06/30/2011 0409   PLT 123* 06/30/2011 0409    BMET    Component Value Date/Time   NA 135 07/01/2011 0400   K 3.8 07/01/2011 0400   CL 102 07/01/2011 0400   CO2 23 07/01/2011 0400   GLUCOSE 116* 07/01/2011 0400   GLUCOSE 100* 01/30/2006 1630   BUN 28* 07/01/2011 0400   CREATININE 1.53* 07/01/2011 0400   CALCIUM 8.6 07/01/2011 0400   CALCIUM 9.5 08/18/2006 0105   GFRNONAA 31* 07/01/2011 0400   GFRAA 36* 07/01/2011 0400    Leonides Sake, MD [facility] Office: [phone] Pager: [phone]  07/01/2011, 9:01 AM

## 2011-07-01 NOTE — Progress Notes (Signed)
CARE MANAGEMENT NOTE 07/01/2011  Patient:  Jean [name], Jean [name]   Account Number:  [id number]  Date Initiated:  07/01/2011  Documentation initiated by:  Vance Peper  Subjective/Objective Assessment:   78 yr old female s/p bilateral laminectomies and foraminotomies decompression L2-L3     Action/Plan:   Patient is for shortterm rehab at SNF   Anticipated DC Date:     Anticipated DC Plan:  SKILLED NURSING FACILITY  In-house referral  Clinical Social Worker      DC Planning Services  CM consult      Choice offered to / List presented to:             Status of service:  In process, will continue to follow M

## 2011-07-01 NOTE — Consult Note (Signed)
[facility] STROKE CONSULT NOTE       Chief Complaint: stroke   HPI:    Jean [name] is an 78 y.o. female who was admitted to the hospital for a Decompression and fusion of L2-3. During surgery acute arterial bleed occurred secondary to avulsed lumbar artery requiring laparotomy and repair by Vascular surgery. During the procedure she lost an estimated 1800cc of blood. On 4/24 PT noted right sided neglect. From 4/23-4/30 platelet count slowely increased from 97 to 123. Initial head CT showed progression of atrophy and small vessel disease.  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    ASA and Plavix was held at time of admission and restarted today.  Neurology was asked to consult.     LSN: 4/22 tPA Given: No: previous surgery    Past Medical History  Diagnosis Date  . Hyperlipidemia   . Thyroid disease     hypo  . CHF (congestive heart failure)   . Mitral regurgitation   . Tricuspid regurgitation   . Anemia   . UTI (lower urinary tract infection)   . Arthritis     Osteoarthtistis  . Elevated glucose   . CVA (cerebral infarction)   . Vitamin d deficiency   . Hypothyroidism   . Myocardial infarction   . Chronic kidney failure     stage 3  . CAD (coronary artery disease)     sees Dr. Ricka Burdock last 03/2011  . Hypertension     Dr. Eden Emms cardiologist, Dr. Cheron Schaumann primary doctor    Past Surgical History  Procedure Date  . Abdominal hysterectomy   . Back surgery   . Coronary stent placement   . Laparotomy 06/23/2011    Procedure: EXPLORATORY LAPAROTOMY;  Surgeon: Barnett Abu, MD;  Location: [facility];  Service: Neurosurgery;  Laterality: Bilateral;    Family History  Problem Relation Age of Onset  . Aneurysm Mother    . Heart disease Mother   . Heart attack Father   . Hypertension Father   . Anesthesia problems Neg Hx    Social History:  reports that she has never smoked. She does not have any smokeless tobacco history on file. She reports that she does not drink alcohol or use illicit drugs.  Allergies:  Allergies  Allergen Reactions  . Ace Inhibitors   . Ezetimibe-Simvastatin     REACTION: Myalgia  . Rosuvastatin     REACTION: aches  . Penicillins Rash    Medications:    Prior to Admission:  Prescriptions prior to admission  Medication Sig Dispense Refill  . aspirin 325 MG tablet Take 325 mg by mouth daily.       . cholecalciferol (VITAMIN D) 1000 UNITS tablet Take 1,000 Units by mouth daily.      . clopidogrel (PLAVIX) 75 MG tablet Take 75 mg by mouth daily.      . Coenzyme Q10 (CO Q 10 PO) Take 1 tablet by mouth daily.       . meclizine (ANTIVERT) 25 MG tablet Take 25 mg by mouth 3 (three) times daily as needed. dizziness      . methyldopa (ALDOMET) 250 MG tablet Take 250 mg by mouth 2 (two) times daily.      . metoprolol (LOPRESSOR) 100  MG tablet Take 150 mg by mouth 2 (two) times daily.      . nitroGLYCERIN (NITROSTAT) 0.4 MG SL tablet Place 0.4 mg under the tongue every 5 (five) minutes as needed. For chest pain      . pravastatin (PRAVACHOL) 20 MG tablet Take 20 mg by mouth 2 (two) times daily.       Scheduled:   . antiseptic oral rinse  15 mL Mouth Rinse q12n4p  . aspirin  81 mg Oral Daily  . bisacodyl  10 mg Rectal Once  . clopidogrel  75 mg Oral Q breakfast  . feeding supplement  1 Container Oral BID BM  . levothyroxine  75 mcg Oral QAC breakfast  . methyldopa  250 mg Oral BID  . metoprolol tartrate  150 mg Oral BID  . pantoprazole  40 mg Oral Q1200  . potassium chloride  40 mEq Oral TID  . risperiDONE  0.5 mg Oral QHS  . sodium chloride  3 mL Intravenous Q12H    ROS: History obtained from the patient  General ROS: negative for - chills, fatigue, fever, night  sweats, weight gain or weight loss Psychological ROS: negative for - behavioral disorder, hallucinations, memory difficulties, mood swings or suicidal ideation Ophthalmic ROS: negative for - blurry vision, double vision, eye pain or loss of vision ENT ROS: negative for - epistaxis, nasal discharge, oral lesions, sore throat, tinnitus or vertigo Allergy and Immunology ROS: negative for - hives or itchy/watery eyes Hematological and Lymphatic ROS: negative for - bleeding problems, bruising or swollen lymph nodes Endocrine ROS: negative for - galactorrhea, hair pattern changes, polydipsia/polyuria or temperature intolerance Respiratory ROS: negative for - cough, hemoptysis, shortness of breath or wheezing Cardiovascular ROS: negative for - chest pain, dyspnea on exertion, edema or irregular heartbeat Gastrointestinal ROS: negative for - abdominal pain, diarrhea, hematemesis, nausea/vomiting or stool incontinence Genito-Urinary ROS: negative for - dysuria, hematuria, incontinence or urinary frequency/urgency Musculoskeletal ROS: negative for - joint swelling or muscular weakness, back pain Neurological ROS: as noted in HPI Dermatological ROS: negative for rash and skin lesion changes   Physical Examination: Blood pressure 146/96, pulse 74, temperature 97.7 F (36.5 C), temperature source Oral, resp. rate 22, height 5' 7.5" (1.715 m), weight 78.7 kg (173 lb 8 oz), SpO2 98.00%.  Neurologic Examination:   Mental Status: Alert, not oriented to date, year, president but does know she is in [facility].  Speech fluent without evidence of aphasia.  Able to follow 3 step commands without difficulty. Cranial Nerves: II: visual fields grossly normal, pupils equal, round, reactive to light and accommodation III,IV, VI: ptosis not present, extraocular muscles extra-ocular motions intact bilaterally V,VII: smile symmetric, facial light touch sensation normal bilaterally VIII: hearing normal bilaterally IX,X: gag  reflex present XI: trapezius strength/neck flexion strength normal bilaterally XII: tongue strength normal  Motor: Bilateral shoulder abduction is antigravity but cannot hold against resistance, Right bicep flexion 4/5 with left 4-/5 bilateral tricept extension 4-/5 noted weaker on left.  Bilateral grips weak.  Drift bil UE with left >right. Poor effort given with LE exam.  She can flex hips bilaterally antigravity with knee flexion and extension showing strength enough to lift slightly off chair but inability to overcome resistance.  Bilateral LE drift.  DF and PF bilaterally 4/5  Sensory: Pinprick and light touch intact throughout, but perseverates on saying I am touching the right. With DSS she neglects her left side and states i am touching right.  I do think she is guessing  many times. When asked to move the extremity i am touching she will move the left but does not move the right. Exam is inconsistant Deep Tendon Reflexes: 2+ and symmetric throughout UE, 1+ left KJ, 2+ right KJ and minimal bilateral AJ Plantars: Right: upgoing   Left: upgoing Cerebellar: normal finger-to-nose, unable to get her to take part in full heel-to-shin test due to weakness.    Lab Results  Component Value Date/Time   CHOL 188 08/16/2010  7:37 AM   TSH 2.674 06/25/2011  8:49 AM      Mr Brain Wo Contrast  07/01/2011  *RADIOLOGY REPORT*  Clinical Data:  Confusion and weakness.  Back surgery 06/23/2011.  MRI HEAD WITHOUT CONTRAST MRA HEAD WITHOUT CONTRAST  Technique: Multiplanar, multiecho pulse sequences of the brain and surrounding structures were obtained according to standard protocol without intravenous contrast.  Angiographic images of the head were obtained using MRA technique without contrast.  Comparison: 06/25/2011 CT.  MRI HEAD  Findings:  Multiple acute/subacute non hemorrhagic small infarcts throughout the supratentorial and infratentorial region.  In the supratentorial region, these have predominately a  parasagittal distribution raising the possibility of watershed infarcts.  The cerebellar involvement may also involve watershed distribution although there are portions that do not appear to be in a watershed distribution and therefore embolic disease is also a consideration.  Remote small infarcts basal ganglion, thalami, corona radiata/centrum semiovale and cerebellar bilaterally.  Prominent small vessel disease type changes.  Global atrophy without hydrocephalus.  No intracranial mass lesion noted on this unenhanced exam.  IMPRESSION: Multiple acute/subacute non hemorrhagic small infarcts throughout the supratentorial and infratentorial region. Question watershed type infarcts with embolic infarcts not excluded as noted above.    MRA HEAD  Findings: Mild to moderate narrowing and irregularity cavernous segment of the internal carotid artery bilaterally.  Mild to moderate narrowing A1 segment left anterior cerebral artery.  Mild to moderate narrowing proximal M1 segment left middle cerebral artery.  Middle cerebral artery mild to moderate branch vessel irregularity.  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  Moderate to marked focal stenosis distal right vertebral artery.  Focal loss of signal distal left vertebral artery consistent with high-grade stenosis/occlusion.  Nonvisualization PICAs.  Mild to moderate narrowing mid to distal basilar artery.  Nonvisualization of left AICA.  Bulbous appearance of the basilar tip without discrete saccular aneurysm.  Moderate to marked stenosis proximal and distal aspect of the posterior cerebral artery bilaterally.  Moderate narrowing superior cerebellar artery bilaterally.  IMPRESSION: Prominent intracranial atherosclerotic type changes as detailed above.  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  Critical Value/emergent results were called by  telephone at the time of interpretation on 07/01/2011  at 10:40 am  to  Dr. Danielle Dess, who verbally acknowledged these results.  Original Report Authenticated By: Fuller Canada, M.D.   Dg Abd 2 Views  07/01/2011  *RADIOLOGY REPORT*  Clinical Data: Ileus, status post aortic repair.  ABDOMEN - 2 VIEW  Comparison: 06/23/2011.  Findings: Supine and right-side up decubitus views.  Right side up decubitus view demonstrates midline laparotomy changes.  Lumbar spine fixation.  No free intraperitoneal air or significant air fluid levels.  Artifact about the left side of the abdomen.  Supine views demonstrate no significant bowel dilatation.  Gas within normal caliber colon.  Distal gas.  No pneumatosis.  Mild left base atelectasis.  Cannot exclude small left pleural effusion.  IMPRESSION: No acute findings.  Original Report Authenticated By: Consuello Bossier, M.D.    Assessment:    78 y.o. female with multiple acute/subacute non hemorrhagic small infarcts throughout  the supratentorial and infratentorial region in watershed distribution.   Stroke Risk Factors - hyperlipidemia, hypertension and CVA  Plan: 1. HgbA1c, fasting lipid panel 2. Carotid dopplers 4. Prophylactic therapy-Antiplatelet med: Aspirin - dose 81 mg and Plavix 75 mg daily  5. Risk factor modification    Felicie Morn PA-C [facility] [phone]  07/01/2011, 2:20 PM    Patient seen and examined. I agree with the above.  Thana Farr, MD [facility] [phone]  07/01/2011  4:39 PM

## 2011-07-01 NOTE — Progress Notes (Signed)
Tried to get patient out of bed with 2 person assist but pt is unable to stand up.  Left arm is more weaker than the right.  Edema to abdominal areas and bilateral thighs.

## 2011-07-02 ENCOUNTER — Inpatient Hospital Stay (HOSPITAL_COMMUNITY)
Admit: 2011-07-02 | Discharge: 2011-07-23 | DRG: 945 | Disposition: A | Discharge status: Skilled Nursing Facility | Payer: Medicare Other | Source: Ambulatory Visit | Attending: Physical Medicine & Rehabilitation | Admitting: Physical Medicine & Rehabilitation

## 2011-07-02 DIAGNOSIS — G8190 Hemiplegia, unspecified affecting unspecified side: Secondary | ICD-10-CM

## 2011-07-02 DIAGNOSIS — Z9861 Coronary angioplasty status: Secondary | ICD-10-CM

## 2011-07-02 DIAGNOSIS — B952 Enterococcus as the cause of diseases classified elsewhere: Secondary | ICD-10-CM

## 2011-07-02 DIAGNOSIS — Z8673 Personal history of transient ischemic attack (TIA), and cerebral infarction without residual deficits: Secondary | ICD-10-CM

## 2011-07-02 DIAGNOSIS — I252 Old myocardial infarction: Secondary | ICD-10-CM

## 2011-07-02 DIAGNOSIS — M4716 Other spondylosis with myelopathy, lumbar region: Secondary | ICD-10-CM

## 2011-07-02 DIAGNOSIS — I517 Cardiomegaly: Secondary | ICD-10-CM

## 2011-07-02 DIAGNOSIS — I2510 Atherosclerotic heart disease of native coronary artery without angina pectoris: Secondary | ICD-10-CM

## 2011-07-02 DIAGNOSIS — N390 Urinary tract infection, site not specified: Secondary | ICD-10-CM

## 2011-07-02 DIAGNOSIS — I059 Rheumatic mitral valve disease, unspecified: Secondary | ICD-10-CM

## 2011-07-02 DIAGNOSIS — I6340 Cerebral infarction due to embolism of unspecified cerebral artery: Secondary | ICD-10-CM

## 2011-07-02 DIAGNOSIS — Z880 Allergy status to penicillin: Secondary | ICD-10-CM

## 2011-07-02 DIAGNOSIS — N189 Chronic kidney disease, unspecified: Secondary | ICD-10-CM

## 2011-07-02 DIAGNOSIS — I6330 Cerebral infarction due to thrombosis of unspecified cerebral artery: Secondary | ICD-10-CM

## 2011-07-02 DIAGNOSIS — I129 Hypertensive chronic kidney disease with stage 1 through stage 4 chronic kidney disease, or unspecified chronic kidney disease: Secondary | ICD-10-CM

## 2011-07-02 DIAGNOSIS — M5416 Radiculopathy, lumbar region: Secondary | ICD-10-CM

## 2011-07-02 DIAGNOSIS — Z7902 Long term (current) use of antithrombotics/antiplatelets: Secondary | ICD-10-CM

## 2011-07-02 DIAGNOSIS — Z5189 Encounter for other specified aftercare: Principal | ICD-10-CM

## 2011-07-02 DIAGNOSIS — Z7982 Long term (current) use of aspirin: Secondary | ICD-10-CM

## 2011-07-02 DIAGNOSIS — M4120 Other idiopathic scoliosis, site unspecified: Secondary | ICD-10-CM

## 2011-07-02 DIAGNOSIS — F29 Unspecified psychosis not due to a substance or known physiological condition: Secondary | ICD-10-CM

## 2011-07-02 DIAGNOSIS — Z79899 Other long term (current) drug therapy: Secondary | ICD-10-CM

## 2011-07-02 DIAGNOSIS — Z888 Allergy status to other drugs, medicaments and biological substances status: Secondary | ICD-10-CM

## 2011-07-02 DIAGNOSIS — M48061 Spinal stenosis, lumbar region without neurogenic claudication: Secondary | ICD-10-CM

## 2011-07-02 DIAGNOSIS — M1990 Unspecified osteoarthritis, unspecified site: Secondary | ICD-10-CM

## 2011-07-02 DIAGNOSIS — I079 Rheumatic tricuspid valve disease, unspecified: Secondary | ICD-10-CM

## 2011-07-02 DIAGNOSIS — E039 Hypothyroidism, unspecified: Secondary | ICD-10-CM

## 2011-07-02 DIAGNOSIS — E785 Hyperlipidemia, unspecified: Secondary | ICD-10-CM

## 2011-07-02 DIAGNOSIS — I6350 Cerebral infarction due to unspecified occlusion or stenosis of unspecified cerebral artery: Secondary | ICD-10-CM

## 2011-07-02 LAB — LIPID PANEL
HDL: 28 mg/dL — ABNORMAL LOW (ref >39)
Total CHOL/HDL Ratio: 5.5 RATIO
VLDL: 23 mg/dL (ref 0–40)

## 2011-07-02 MED ORDER — MENTHOL 3 MG MT LOZG
1.0000 | LOZENGE | OROMUCOSAL | Status: DC | PRN
  Filled 2011-07-02: qty 9

## 2011-07-02 MED ORDER — NITROGLYCERIN 0.4 MG SL SUBL
0.4000 mg | SUBLINGUAL_TABLET | SUBLINGUAL | Status: DC | PRN
  Filled 2011-07-02: qty 75

## 2011-07-02 MED ORDER — METOPROLOL TARTRATE 50 MG PO TABS
150.0000 mg | ORAL_TABLET | Freq: Two times a day (BID) | ORAL | Status: DC
  Administered 2011-07-02 – 2011-07-06 (×8): 150 mg via ORAL
  Filled 2011-07-02 (×12): qty 1

## 2011-07-02 MED ORDER — BISACODYL 10 MG RE SUPP: 10.0000 mg | Freq: Every day | RECTAL | Status: DC | PRN

## 2011-07-02 MED ORDER — PHENOL 1.4 % MT LIQD
1.0000 | OROMUCOSAL | Status: DC | PRN
  Administered 2011-07-18 (×2): 1 via OROMUCOSAL
  Filled 2011-07-02: qty 177

## 2011-07-02 MED ORDER — SORBITOL 70 % SOLN
30.0000 mL | Freq: Every day | Status: DC | PRN
  Administered 2011-07-07: 30 mL via ORAL
  Filled 2011-07-02: qty 30

## 2011-07-02 MED ORDER — ASPIRIN 81 MG PO CHEW
81.0000 mg | CHEWABLE_TABLET | Freq: Every day | ORAL | Status: DC
  Administered 2011-07-03 – 2011-07-23 (×21): 81 mg via ORAL
  Filled 2011-07-02 (×23): qty 1

## 2011-07-02 MED ORDER — FLEET ENEMA 7-19 GM/118ML RE ENEM
1.0000 | ENEMA | Freq: Every day | RECTAL | Status: DC | PRN
  Filled 2011-07-02: qty 1

## 2011-07-02 MED ORDER — ONDANSETRON HCL 4 MG/2ML IJ SOLN: 4.0000 mg | Freq: Four times a day (QID) | INTRAMUSCULAR | Status: DC | PRN

## 2011-07-02 MED ORDER — METHYLDOPA 250 MG PO TABS
250.0000 mg | ORAL_TABLET | Freq: Two times a day (BID) | ORAL | Status: DC
  Administered 2011-07-02 – 2011-07-23 (×42): 250 mg via ORAL
  Filled 2011-07-02 (×47): qty 1

## 2011-07-02 MED ORDER — METHOCARBAMOL 500 MG PO TABS
250.0000 mg | ORAL_TABLET | Freq: Three times a day (TID) | ORAL | Status: DC | PRN
  Administered 2011-07-03 – 2011-07-04 (×2): 250 mg via ORAL
  Filled 2011-07-02 (×4): qty 1

## 2011-07-02 MED ORDER — POLYETHYLENE GLYCOL 3350 17 G PO PACK
17.0000 g | PACK | Freq: Every day | ORAL | Status: DC | PRN
  Filled 2011-07-02: qty 1

## 2011-07-02 MED ORDER — ACETAMINOPHEN 325 MG PO TABS
325.0000 mg | ORAL_TABLET | ORAL | Status: DC | PRN
  Administered 2011-07-06 – 2011-07-13 (×3): 650 mg via ORAL
  Filled 2011-07-02 (×3): qty 2

## 2011-07-02 MED ORDER — BOOST / RESOURCE BREEZE PO LIQD
1.0000 | Freq: Two times a day (BID) | ORAL | Status: DC
  Administered 2011-07-04 – 2011-07-21 (×16): 1 via ORAL

## 2011-07-02 MED ORDER — RISPERIDONE 0.5 MG PO TABS
0.5000 mg | ORAL_TABLET | Freq: Every day | ORAL | Status: DC
  Administered 2011-07-02 – 2011-07-05 (×4): 0.5 mg via ORAL
  Filled 2011-07-02 (×6): qty 1

## 2011-07-02 MED ORDER — CLOPIDOGREL BISULFATE 75 MG PO TABS
75.0000 mg | ORAL_TABLET | Freq: Every day | ORAL | Status: DC
  Administered 2011-07-03 – 2011-07-23 (×21): 75 mg via ORAL
  Filled 2011-07-02 (×24): qty 1

## 2011-07-02 MED ORDER — PANTOPRAZOLE SODIUM 40 MG PO TBEC
40.0000 mg | DELAYED_RELEASE_TABLET | Freq: Every day | ORAL | Status: DC
  Administered 2011-07-03 – 2011-07-23 (×21): 40 mg via ORAL
  Filled 2011-07-02 (×26): qty 1

## 2011-07-02 MED ORDER — SENNOSIDES-DOCUSATE SODIUM 8.6-50 MG PO TABS
1.0000 | ORAL_TABLET | Freq: Two times a day (BID) | ORAL | Status: DC
  Administered 2011-07-02: 1 via ORAL
  Filled 2011-07-02 (×2): qty 1

## 2011-07-02 MED ORDER — LEVOTHYROXINE SODIUM 75 MCG PO TABS
75.0000 ug | ORAL_TABLET | Freq: Every day | ORAL | Status: DC
  Administered 2011-07-03 – 2011-07-23 (×21): 75 ug via ORAL
  Filled 2011-07-02 (×24): qty 1

## 2011-07-02 MED ORDER — ONDANSETRON HCL 4 MG PO TABS: 4.0000 mg | ORAL_TABLET | Freq: Four times a day (QID) | ORAL | Status: DC | PRN

## 2011-07-02 NOTE — Discharge Instructions (Signed)
STROKE/TIA DISCHARGE INSTRUCTIONS SMOKING Cigarette smoking nearly doubles your risk of having a stroke & is the single most alterable risk factor  If you smoke or have smoked in the last 12 months, you are advised to quit smoking for your health.  Most of the excess cardiovascular risk related to smoking disappears within a year of stopping.  Ask you doctor about anti-smoking medications  [place] Quit Line: 1-800-QUIT NOW  Free Smoking Cessation Classes [phone]  CHOLESTEROL Know your levels; limit fat & cholesterol in your diet  Lipid Panel     Component Value Date/Time   CHOL 154 07/02/2011 0412   TRIG 117 07/02/2011 0412   HDL 28* 07/02/2011 0412   CHOLHDL 5.5 07/02/2011 0412   VLDL 23 07/02/2011 0412   LDLCALC 103* 07/02/2011 0412      Many patients benefit from treatment even if their cholesterol is at goal.  Goal: Total Cholesterol (CHOL) less than 160  Goal:  Triglycerides (TRIG) less than 150  Goal:  HDL greater than 40  Goal:  LDL (LDLCALC) less than 100   BLOOD PRESSURE American Stroke Association blood pressure target is less that 120/80 mm/Hg  Your discharge blood pressure is:  BP: 138/58 mmHg  Monitor your blood pressure  Limit your salt and alcohol intake  Many individuals will require more than one medication for high blood pressure  DIABETES (A1c is a blood sugar average for last 3 months) Goal HGBA1c is under 7% (HBGA1c is blood sugar average for last 3 months)  Diabetes: {STROKE DC DIABETES:22357}    Lab Results  Component Value Date   HGBA1C 5.7* 07/01/2011     Your HGBA1c can be lowered with medications, healthy diet, and exercise.  Check your blood sugar as directed by your physician  Call your physician if you experience unexplained or low blood sugars.  PHYSICAL ACTIVITY/REHABILITATION Goal is 30 minutes at least 4 days per week    {STROKE DC ACTIVITY/REHAB:22359}  Activity decreases your risk of heart attack and stroke and makes your heart stronger.   It helps control your weight and blood pressure; helps you relax and can improve your mood.  Participate in a regular exercise program.  Talk with your doctor about the best form of exercise for you (dancing, walking, swimming, cycling).  DIET/WEIGHT Goal is to maintain a healthy weight  Your discharge diet is: General *** liquids Your height is:  Height: 5' 7.5" (171.5 cm) Your current weight is: Weight: 77.8 kg (171 lb 8.3 oz) Your Body Mass Index (BMI) is:  BMI (Calculated): 26.8   Following the type of diet specifically designed for you will help prevent another stroke.  Your goal weight range is:  ***  Your goal Body Mass Index (BMI) is 19-24.  Healthy food habits can help reduce 3 risk factors for stroke:  High cholesterol, hypertension, and excess weight.  RESOURCES Stroke/Support Group:  Call [phone]  they meet the 3rd Sunday of the month on the Rehab Unit at [facility], [place] ( no meetings June, July & Aug).  STROKE EDUCATION PROVIDED/REVIEWED AND GIVEN TO PATIENT Stroke warning signs and symptoms How to activate emergency medical system (call 911). Medications prescribed at discharge. Need for follow-up after discharge. Personal risk factors for stroke. Pneumonia vaccine given:   {STROKE DC YES/NO/DATE:22363} Flu vaccine given:   {STROKE DC YES/NO/DATE:22363} My questions have been answered, the writing is legible, and I understand these instructions.  I will adhere to these goals & educational materials that have  been provided to me after my discharge from the hospital.

## 2011-07-02 NOTE — Progress Notes (Signed)
Pt is alert and oriented to person. Pt oriented to room, call bell in reach. Husband educated on   orientation to unit, safety plan and rehab schedule. Husband verbalized an understanding. See CH[facility] for full assessment.

## 2011-07-02 NOTE — Progress Notes (Signed)
[facility]  Daily Progress Note  Assessment/Planning: POD #9 s/p xlap, aortic repair   MRI/MRA findings noted: mgmt per Dr. Danielle Dess and Neurology  Normal gas pattern on abd films with evidence of progression of gas into rectum  Adding some prn agents if pt c/o constipation  Subjective  - 9 Days Post-Op  Pt eating breakfast this AM, no complaints  Objective Filed Vitals:   07/01/11 2029 07/02/11 0000 07/02/11 0400 07/02/11 0814  BP: 150/63 142/58 141/68 156/64  Pulse: 76 80 64 67  Temp: 99.3 F (37.4 C) 98.6 F (37 C) 99.1 F (37.3 C) 98.1 F (36.7 C)  TempSrc: Oral Axillary Axillary Oral  Resp: 23 16 15 16   Height:      Weight:  171 lb 8.3 oz (77.8 kg)    SpO2:  99%  95%    Intake/Output Summary (Last 24 hours) at 07/02/11 0836 Last data filed at 07/02/11 0000  Gross per 24 hour  Intake    118 ml  Output    900 ml  Net   -782 ml    PULM  CTAB CV  RRR GI  soft, no TTP, inc c/d/i, staples intact, mildly distended VASC  B feet warm and perfused  Laboratory CBC    Component Value Date/Time   WBC 7.9 06/30/2011 0409   HGB 11.2* 06/30/2011 0409   HCT 32.7* 06/30/2011 0409   PLT 123* 06/30/2011 0409    BMET    Component Value Date/Time   NA 135 07/01/2011 0400   K 3.8 07/01/2011 0400   CL 102 07/01/2011 0400   CO2 23 07/01/2011 0400   GLUCOSE 116* 07/01/2011 0400   GLUCOSE 100* 01/30/2006 1630   BUN 28* 07/01/2011 0400   CREATININE 1.53* 07/01/2011 0400   CALCIUM 8.6 07/01/2011 0400   CALCIUM 9.5 08/18/2006 0105   GFRNONAA 31* 07/01/2011 0400   GFRAA 36* 07/01/2011 0400    Leonides Sake, MD [facility] Office: [phone] Pager: [phone]  07/02/2011, 8:36 AM

## 2011-07-02 NOTE — Progress Notes (Signed)
CSW is signing off as pt will be admitted to CIR today, per CIR admissions coordinator. Please reconsult if a need arises prior to discharge.   Dede Query, MSW, Theresia Majors [phone]

## 2011-07-02 NOTE — Plan of Care (Signed)
Overall Plan of Care [facility]) Patient Details Name: Jean [name] MRN: [id number] DOB: 12-11-1928  Diagnosis:  Lumbar stenosis s/p fusion, post-op CVA  Primary Diagnosis:    Lumbar radiculopathy Co-morbidities: CHF, Pain  Functional Problem List  Patient demonstrates impairments in the following areas: Balance, Cognition, Edema, Medication Management, Motor, Pain, Perception, Safety, Sensory  and Skin Integrity  Basic ADL's: eating, grooming, bathing, dressing and toileting Advanced ADL's: simple meal preparation  Transfers:  bed mobility, bed to chair, toilet, tub/shower, car and furniture Locomotion:  ambulation, wheelchair mobility and stairs  Additional Impairments:  Functional use of upper extremity, Communication  comprehension and expression, Social Cognition   problem solving, memory, attention and awareness and Discharge Disposition  Anticipated Outcomes Item Anticipated Outcome  Eating/Swallowing  Min A  Basic self-care  Min A  Tolieting  Min A  Bowel/Bladder    Transfers  Min A  Locomotion  Min A  Communication  Min A  Cognition  Min A  Pain  Pt will be managed based on face scale  Safety/Judgment  Min A  Other  Incision to back and abdomen will heal and remain free of infection, pt's skin will remain free of breakdown   Therapy Plan: PT Frequency: 1-2 X/day, 60-90 minutes OT Frequency: 1-2 X/day, 60-90 minutes SLP Frequency:1-2X\day, 60-90 minutes  Team Interventions: Item RN PT OT SLP SW TR Other  Self Care/Advanced ADL Retraining   x      Neuromuscular Re-Education  x x      Therapeutic Activities  x x x  x   UE/LE Strength Training/ROM  x x   x   UE/LE Coordination Activities  x x   x   Visual/Perceptual Remediation/Compensation         DME/Adaptive Equipment Instruction  x x   x   Therapeutic Exercise  x x   x   Balance/Vestibular Training  x x   x   Patient/Family Education x x x x  x   Cognitive Remediation/Compensation  x x x  x     Functional Mobility Training  x x   x   Ambulation/Gait Training  x       Stair Training  x       Wheelchair Propulsion/Positioning  x       Functional Tourist information centre manager Reintegration   x   x   Dysphagia/Aspiration Precaution Training    x     Speech/Language Facilitation    x     Bladder Management x        Bowel Management x        Disease Management/Prevention x        Pain Management x x       Medication Management x        Skin Care/Wound Management x        Splinting/Orthotics         Discharge Planning  x x x x x   Psychosocial Support x  x  x x                      Team Discharge Planning: Destination:  Home Projected Follow-up:  PT, OT and Home Health Projected Equipment Needs:  Bedside Commode, Biomedical engineer involved in discharge planning:  Yes  MD ELOS: 3-4 weeks Medical Rehab Prognosis:  Excellent Assessment: Pt has been admitted for CIR therapies. She had watershed infarcts after  back fusion surgery.  Goals are set at min assist. She has delays in processing and cognition in the setting of weakness and pain from her neurological issues. She will work on self-care, mobility, CPT, NMR, ADL's with therapy and nursing. Family is supportive

## 2011-07-02 NOTE — Progress Notes (Signed)
Subjective: Patient reports feeling a bit better today  Objective: Vital signs in last 24 hours: Temp:  [97.7 F (36.5 C)-99.3 F (37.4 C)] 99.1 F (37.3 C) (05/01 0400) Pulse Rate:  [64-80] 64  (05/01 0400) Resp:  [15-23] 15  (05/01 0400) BP: (136-158)/(56-96) 141/68 mmHg (05/01 0400) SpO2:  [98 %-100 %] 99 % (05/01 0000) Weight:  [77.8 kg (171 lb 8.3 oz)] 77.8 kg (171 lb 8.3 oz) (05/01 0000)  Intake/Output from previous day: 04/30 0701 - 05/01 0700 In: 118 [P.O.:118] Out: 1200 [Urine:1200] Intake/Output this shift:    Physical Exam: Up in lift chair with PT help.  Alert, oriented and conversant.  No complaints.  Lab Results:  [facility] 06/30/11 0409  WBC 7.9  HGB 11.2*  HCT 32.7*  PLT 123*   BMET  Basename 07/01/11 0400 06/30/11 0409  NA 135 133*  K 3.8 3.2*  CL 102 100  CO2 23 23  GLUCOSE 116* 106*  BUN 28* 26*  CREATININE 1.53* 1.44*  CALCIUM 8.6 8.5    Studies/Results: Mr [facility] Contrast  07/01/2011  *RADIOLOGY REPORT*  Clinical Data:  Confusion and weakness.  Back surgery 06/23/2011.  MRI HEAD WITHOUT CONTRAST MRA HEAD WITHOUT CONTRAST  Technique: Multiplanar, multiecho pulse sequences of the brain and surrounding structures were obtained according to standard protocol without intravenous contrast.  Angiographic images of the head were obtained using MRA technique without contrast.  Comparison: 06/25/2011 CT.  MRI HEAD  Findings:  Multiple acute/subacute non hemorrhagic small infarcts throughout the supratentorial and infratentorial region.  In the supratentorial region, these have predominately a parasagittal distribution raising the possibility of watershed infarcts.  The cerebellar involvement may also involve watershed distribution although there are portions that do not appear to be in a watershed distribution and therefore embolic disease is also a consideration.  Remote small infarcts basal ganglion, thalami, corona radiata/centrum semiovale and  cerebellar bilaterally.  Prominent small vessel disease type changes.  Global atrophy without hydrocephalus.  No intracranial mass lesion noted on this unenhanced exam.  IMPRESSION: Multiple acute/subacute non hemorrhagic small infarcts throughout the supratentorial and infratentorial region. Question watershed type infarcts with embolic infarcts not excluded as noted above.  MRA HEAD  Findings: Mild to moderate narrowing and irregularity cavernous segment of the internal carotid artery bilaterally.  Mild to moderate narrowing A1 segment left anterior cerebral artery.  Mild to moderate narrowing proximal M1 segment left middle cerebral artery.  Middle cerebral artery mild to moderate branch vessel irregularity.  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  Moderate to marked focal stenosis distal right vertebral artery.  Focal loss of signal distal left vertebral artery consistent with high-grade stenosis/occlusion.  Nonvisualization PICAs.  Mild to moderate narrowing mid to distal basilar artery.  Nonvisualization of left AICA.  Bulbous appearance of the basilar tip without discrete saccular aneurysm.  Moderate to marked stenosis proximal and distal aspect of the posterior cerebral artery bilaterally.  Moderate narrowing superior cerebellar artery bilaterally.  IMPRESSION: Prominent intracranial atherosclerotic type changes as detailed above.  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  Critical Value/emergent results were called by telephone at the time of interpretation on 07/01/2011  at 10:40 am  to  Dr. Danielle Dess, who verbally acknowledged these results.  Original Report Authenticated By: Fuller Canada, M.D.   Mr Brain Wo Contrast  07/01/2011  *RADIOLOGY REPORT*  Clinical Data:  Confusion and weakness.  Back  surgery 06/23/2011.  MRI HEAD WITHOUT CONTRAST MRA HEAD WITHOUT CONTRAST  Technique:  Multiplanar, multiecho pulse sequences of the brain and surrounding structures were obtained according to standard protocol without intravenous contrast.  Angiographic images of the head were obtained using MRA technique without contrast.  Comparison: 06/25/2011 CT.  MRI HEAD  Findings:  Multiple acute/subacute non hemorrhagic small infarcts throughout the supratentorial and infratentorial region.  In the supratentorial region, these have predominately a parasagittal distribution raising the possibility of watershed infarcts.  The cerebellar involvement may also involve watershed distribution although there are portions that do not appear to be in a watershed distribution and therefore embolic disease is also a consideration.  Remote small infarcts basal ganglion, thalami, corona radiata/centrum semiovale and cerebellar bilaterally.  Prominent small vessel disease type changes.  Global atrophy without hydrocephalus.  No intracranial mass lesion noted on this unenhanced exam.  IMPRESSION: Multiple acute/subacute non hemorrhagic small infarcts throughout the supratentorial and infratentorial region. Question watershed type infarcts with embolic infarcts not excluded as noted above.  MRA HEAD  Findings: Mild to moderate narrowing and irregularity cavernous segment of the internal carotid artery bilaterally.  Mild to moderate narrowing A1 segment left anterior cerebral artery.  Mild to moderate narrowing proximal M1 segment left middle cerebral artery.  Middle cerebral artery mild to moderate branch vessel irregularity.  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  Moderate to marked focal stenosis distal right vertebral artery.  Focal loss of signal distal left vertebral artery consistent with high-grade stenosis/occlusion.  Nonvisualization PICAs.  Mild to moderate narrowing mid to distal basilar artery.  Nonvisualization of left AICA.  Bulbous appearance of  the basilar tip without discrete saccular aneurysm.  Moderate to marked stenosis proximal and distal aspect of the posterior cerebral artery bilaterally.  Moderate narrowing superior cerebellar artery bilaterally.  IMPRESSION: Prominent intracranial atherosclerotic type changes as detailed above.  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  Critical Value/emergent results were called by telephone at the time of interpretation on 07/01/2011  at 10:40 am  to  Dr. Danielle Dess, who verbally acknowledged these results.  Original Report Authenticated By: Fuller Canada, M.D.   Dg Abd 2 Views  07/01/2011  *RADIOLOGY REPORT*  Clinical Data: Ileus, status post aortic repair.  ABDOMEN - 2 VIEW  Comparison: 06/23/2011.  Findings: Supine and right-side up decubitus views.  Right side up decubitus view demonstrates midline laparotomy changes.  Lumbar spine fixation.  No free intraperitoneal air or significant air fluid levels.  Artifact about the left side of the abdomen.  Supine views demonstrate no significant bowel dilatation.  Gas within normal caliber colon.  Distal gas.  No pneumatosis.  Mild left base atelectasis.  Cannot exclude small left pleural effusion.  IMPRESSION: No acute findings.  Original Report Authenticated By: Consuello Bossier, M.D.    Assessment/Plan: Mobilizing with PT.  Improving.    LOS: 9 days    Dorian Heckle, MD 07/02/2011, 7:59 AM

## 2011-07-02 NOTE — Progress Notes (Signed)
*  PRELIMINARY RESULTS* Echocardiogram 2D Echocardiogram has been performed.  Glean Salen [facility] 07/02/2011, 3:10 PM

## 2011-07-02 NOTE — Progress Notes (Signed)
VASCULAR LAB PRELIMINARY  PRELIMINARY  PRELIMINARY  PRELIMINARY  Carotid duplex  completed.    Preliminary report:  Bilateral:  No evidence of hemodynamically significant internal carotid artery stenosis.   Vertebral artery flow is antegrade.     Terance Hart, RVT 07/02/2011, 2:28 PM

## 2011-07-02 NOTE — Progress Notes (Signed)
Stroke Team Progress Note  HISTORY Jean [name] is an 78 y.o. female who was admitted to the hospital for a Decompression and fusion of L2-3. During surgery acute arterial bleed occurred secondary to avulsed lumbar artery requiring laparotomy and repair by Vascular surgery. During the procedure she lost an estimated 1800cc of blood. On 4/24 PT noted right sided neglect. From 4/23-4/30 platelet count slowely increased from 97 to 123. Initial head CT showed progression of atrophy and small vessel disease. 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 ASA and Plavix was held at time of admission and restarted today. Neurology was asked to consult. Patient was not a TPA candidate secondary to unknown time of onset as well as recent OR.  SUBJECTIVE No family is at the bedside.  Overall she feels her condition is stable. She was surprised to hear of stroke diagnosis.  OBJECTIVE Most recent Vital Signs: Filed Vitals:   07/01/11 2029 07/02/11 0000 07/02/11 0400 07/02/11 0814  BP: 150/63 142/58 141/68 156/64  Pulse: 76 80 64 67  Temp: 99.3 F (37.4 C) 98.6 F (37 C) 99.1 F (37.3 C) 98.1 F (36.7 C)  TempSrc: Oral Axillary Axillary Oral  Resp: 23 16 15 16   Height:      Weight:  77.8 kg (171 lb 8.3 oz)    SpO2:  99%  95%   CBG (last 3)  No results found for this basename: GLUCAP:3 in the last 72 hours Intake/Output from previous day: 04/30 0701 - 05/01 0700 In: 118 [P.O.:118] Out: 1200 [Urine:1200]  IV Fluid Intake:     MEDICATIONS    . antiseptic oral rinse  15 mL Mouth Rinse q12n4p  . aspirin  81 mg Oral Daily  . bisacodyl  10 mg Rectal Once  . clopidogrel  75 mg Oral Q breakfast  . feeding supplement  1 Container Oral BID BM  . levothyroxine  75 mcg Oral QAC  breakfast  . methyldopa  250 mg Oral BID  . metoprolol tartrate  150 mg Oral BID  . pantoprazole  40 mg Oral Q1200  . risperiDONE  0.5 mg Oral QHS  . senna-docusate  1 tablet Oral BID  . sodium chloride  3 mL Intravenous Q12H   PRN:  acetaminophen, acetaminophen, bisacodyl, fentaNYL, menthol-cetylpyridinium, nitroGLYCERIN, ondansetron (ZOFRAN) IV, phenol, sodium chloride, sodium phosphate  Diet:  General thin liquids Activity:   Ambulate patient DVT Prophylaxis:  SCDs   CLINICALLY SIGNIFICANT STUDIES CBC    Component Value Date/Time   WBC 7.9 06/30/2011 0409   RBC 3.92 06/30/2011 0409   HGB 11.2* 06/30/2011 0409   HCT 32.7* 06/30/2011 0409   PLT 123* 06/30/2011 0409   MCV 83.4 06/30/2011 0409   MCH 28.6 06/30/2011 0409   MCHC 34.3 06/30/2011 0409   RDW 15.1 06/30/2011 0409   LYMPHSABS 1.4 06/30/2011 0409   MONOABS 1.1* 06/30/2011 0409   EOSABS 0.2 06/30/2011 0409   BASOSABS 0.0 06/30/2011 0409   CMP    Component Value Date/Time   NA 135 07/01/2011 0400   K 3.8 07/01/2011 0400   CL 102 07/01/2011 0400   CO2 23 07/01/2011 0400   GLUCOSE 116* 07/01/2011 0400   GLUCOSE 100* 01/30/2006 1630   BUN 28* 07/01/2011 0400   CREATININE 1.53* 07/01/2011 0400   CALCIUM 8.6 07/01/2011 0400   CALCIUM 9.5 08/18/2006  0105   PROT 7.4 12/23/2010 0739   ALBUMIN 3.9 12/23/2010 0739   AST 29 12/23/2010 0739   ALT 15 12/23/2010 0739   ALKPHOS 73 12/23/2010 0739   BILITOT 0.6 12/23/2010 0739   GFRNONAA 31* 07/01/2011 0400   GFRAA 36* 07/01/2011 0400   COAGS Lab Results  Component Value Date   INR 1.29 06/23/2011   INR 1.0 ratio 11/15/2009   INR 1.0 ratio 09/24/2009   Lipid Panel    Component Value Date/Time   CHOL 154 07/02/2011 0412   TRIG 117 07/02/2011 0412   HDL 28* 07/02/2011 0412   CHOLHDL 5.5 07/02/2011 0412   VLDL 23 07/02/2011 0412   LDLCALC 103* 07/02/2011 0412   HgbA1C  Lab Results  Component Value Date   HGBA1C 5.7* 07/01/2011   Cardiac Panel (last 3 results) No results found for this basename:  CKTOTAL:3,CKMB:3,TROPONINI:3,RELINDX:3 in the last 72 hours Urinalysis    Component Value Date/Time   COLORURINE LT. YELLOW 12/23/2010 0739   APPEARANCEUR CLEAR 12/23/2010 0739   LABSPEC 1.015 12/23/2010 0739   PHURINE 6.0 12/23/2010 0739   GLUCOSEU NEGATIVE 08/22/2009 1600   HGBUR SMALL 12/23/2010 0739   BILIRUBINUR NEGATIVE 12/23/2010 0739   KETONESUR NEGATIVE 12/23/2010 0739   PROTEINUR NEGATIVE 08/22/2009 1600   UROBILINOGEN 0.2 12/23/2010 0739   NITRITE POSITIVE 12/23/2010 0739   LEUKOCYTESUR MODERATE 12/23/2010 0739   Urine Drug Screen  No results found for this basename: labopia, cocainscrnur, labbenz, amphetmu, thcu, labbarb    Alcohol Level No results found for this basename: eth   Dg Abd 2 Views  07/01/2011  No acute findings.   CT of the brain  06/25/2011 Progression of atrophy and small vessel disease is since 2006. No definite acute infarct or hemorrhage. MRI could be helpful in further evaluation.  MRI of the brain  07/01/2011  Multiple acute/subacute non hemorrhagic small infarcts throughout the supratentorial and infratentorial region. Question watershed type infarcts with embolic infarcts not excluded as noted above.   MRA of the brain  07/01/2011  Prominent intracranial atherosclerotic type changes as detailed above.  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    2D Echocardiogram  canceled  Carotid Doppler  pending  CXR   06/28/2011 1. Persistent left lower lobe atelectasis.  2. Improvement in right lower lobe air space disease. 06/28/2011 1. Low lung volumes with bibasilar opacities favored to reflects small bilateral pleural effusions with superimposed areas of passive atelectasis (underlying airspace consolidation from infection or aspiration is difficult to  exclude). 2. Mild cardiomegaly is unchanged. 3. Atherosclerosis.  EKG  06/23/2011 normal sinus rhythm, left axis deviation, 1st degree AV block, LVH,  anterior infarct age undermined.   Therapy Recommendations PT CIR, OT CIR  Physical Exam  Pleasant elderly Caucasian lady sitting up in a bedside chair.Awake alert. Afebrile. Head is nontraumatic. Neck is supple without bruit. Hearing is normal. Cardiac exam no murmur or gallop. Lungs are clear to auscultation. Distal pulses are well felt.   Neurological exam : awake alert oriented x2. Diminished attention, registration and recall. Follows simple one and few .2 step commands. Extraocular moments are full range without nystagmus. Speech is slow but without dysarthria. There is no facial weakness. Tongue is midline. Motor system exam moves all 4 extremities well against gravity. There is minimal weakness of the left grip and intrinsic hand muscles. Lower extremities strength testing is limited do to her recent back and abdominal surgery. But she is able  to move her ankles and toes well.  ASSESSMENT Ms. Jean [name] is a 78 y.o. female with multiple acute/subacute non hemorrhagic small infarcts throughout the supratentorial and infratentorial region. Differential for Infarcts include hypoperfusion during OR especially if had low BP, severe extra or intracranial ICA stenosis, low EF, ? atrial fibrillation, or venous/arterial shunting of embolus/debris.  On aspirin 325 mg orally every day and clopidogrel 75 mg orally every day prior to admission. Now on aspirin 81 mg orally every day and clopidogrel 75 mg orally every day for secondary stroke prevention. Patient with resultant generalized weakness, left greater than right hemiparesis, .  -POD #9 s/p xlap, aortic repair by Dr. Imogene Burn -POD#9 decompression and fusion L2-3 by Dr. Danielle Dess with avulsed lumbar artery requiring xlap -hyperlipidemia, allergic to simvastatin (myalgias) and rosuvastatin (aches), LDL 103 -hypertension -hypothyroid disease -hx CVA -CAD w/ MI, stent -chronic renal failure, Cr improving at 1.44 -anemia 11.2 and thrombocytopenia 123  due to acute blood loss 1800cc and dilution, both improving -acute delirium post op, placed on risperdal 0.5mg  q hs  Hospital day # 9  TREATMENT/PLAN -Continue aspirin 81 mg orally every day and clopidogrel 75 mg orally every day for secondary stroke prevention. -agree with rehab consults -complete stroke workup - check 2D echo, carotid doppler -? Discontinue / adjust risperdal -? Treat LDL,   Jean [name], AVNP, ANP-BC, GNP-BC [facility] Pager: [phone] 07/02/2011 9:53 AM  Dr. Delia Heady, Stroke Center Medical Director, has personally reviewed chart, pertinent data, examined the patient and developed the plan of care. Pager:  [phone]

## 2011-07-02 NOTE — Progress Notes (Signed)
Physical Therapy Treatment Patient Details Name: Jean [name] MRN: [id number] DOB: 1929/01/29 Today's Date: 07/02/2011 Time: 0828-0906 PT Time Calculation (min): 38 min  PT Assessment / Plan / Recommendation Comments on Treatment Session  Patient s/p back surgery and aorta repair.  Also with watershed infarcts.  Continues with poor endurance for activity and profound weakness all 4's.  Should go to Rehab soon.      Follow Up Recommendations  Inpatient Rehab    Equipment Recommendations  Defer to next venue    Frequency Min 5X/week   Plan Discharge plan remains appropriate;Frequency remains appropriate    Precautions / Restrictions Precautions Precautions: Fall;Back Precaution Comments: Continues with difficulty with recall of back precautions. Spinal Brace: Lumbar corset;Applied in supine position Restrictions Weight Bearing Restrictions: No   Pertinent Vitals/Pain VSS/6/10 pain    Mobility  Bed Mobility Bed Mobility: Rolling Right;Rolling Left;Right Sidelying to Sit;Sitting - Scoot to [place] of Bed Rolling Right: 3: Mod assist;With rail Rolling Left: 3: Mod assist;With rail Right Sidelying to Sit: 1: +2 Total assist;With rails;HOB elevated (HOB at 10Degrees) Right Sidelying to Sit: Patient Percentage: 60% Sitting - Scoot to Edge of Bed: 4: Min assist Sit to Sidelying Right: Not Tested (comment) Scooting to [facility]: Not tested (comment) Details for Bed Mobility Assistance: Patient needs cues for technique for log roll and continues to need assist to elevate trunk  Transfers Transfers: Sit to Stand;Stand to Sit;Stand Pivot Transfers Sit to Stand: 1: +2 Total assist;With upper extremity assist;With armrests;From bed Sit to Stand: Patient Percentage: 50% (in [place] Dec Plus) Stand to Sit: 1: +2 Total assist;With upper extremity assist;With armrests;To chair/3-in-1 (in sara plus) Stand to Sit: Patient Percentage: 50% Stand Pivot Transfers: 1: +2 Total assist;With armrests Stand Pivot  Transfers: Patient Percentage: 60% Transfer via Lift Equipment: Hydrographic surveyor Details for Transfer Assistance: Used [place] Dec plus with footplate.  Patient was able to stand x 2 for 4 minutes with good upright posture with constant verbal cues and manual facilitation.  Patient stood second time for 1 minute.  USed [place] Dec plus to move patient to reclinder.  Patient continued to need constant cues for full upright posture needing extra assist to stand upright the second time.    Ambulation/Gait Ambulation/Gait Assistance: Not tested (comment) Stairs: No Wheelchair Mobility Wheelchair Mobility: No Modified Rankin (Stroke Patients Only) Modified Rankin: Severe disability         PT Goals Acute Rehab PT Goals PT Goal: Rolling Supine to Right Side - Progress: Progressing toward goal PT Goal: Supine/Side to Sit - Progress: Progressing toward goal PT Goal: Sit to Stand - Progress: Progressing toward goal PT Goal: Stand to Sit - Progress: Progressing toward goal PT Transfer Goal: Bed to Chair/Chair to Bed - Progress: Progressing toward goal Additional Goals PT Goal: Additional Goal #1 - Progress: Progressing toward goal  Visit Information  Last PT Received On: 07/02/11 Assistance Needed: +2    Subjective Data  Subjective: "I just hurt in my back." Patient Stated Goal: To get home as soon as possible   Cognition  Overall Cognitive Status: Impaired Area of Impairment: Attention;Following commands;Memory Arousal/Alertness: Awake/alert Orientation Level: Oriented X4 / Intact Behavior During Session: [facility] for tasks performed Current Attention Level: Sustained Attention - Other Comments: Continues to be slow to respond verbally and motorically  Memory: Decreased recall of precautions Following Commands: Follows one step commands consistently    Balance  Static Sitting Balance Static Sitting - Balance Support: Bilateral upper extremity supported;Feet supported Static Sitting - Level of Assistance:  2: Max assist Static Sitting - Comment/# of Minutes: Patient had difficulty sitting EOB.  Significant posterior lean continues.  Needed max assist for stability.  Sat a total of 2 minutes. Static Standing Balance Static Standing - Balance Support: Bilateral upper extremity supported;During functional activity Static Standing - Level of Assistance: 1: +2 Total assist Static Standing - Comment/# of Minutes: pt = 60% in [place] plus for 4 minutes and then 1 minute  End of Session PT - End of Session Equipment Utilized During Treatment: Gait belt;Back brace Activity Tolerance: Patient limited by fatigue Patient left: in chair;with call bell/phone within reach;with family/visitor present Nurse Communication: Mobility status;Need for lift equipment    INGOLD,Orvetta Danielski 07/02/2011, 10:13 AM  [facility] [phone] (pager)

## 2011-07-02 NOTE — H&P (Signed)
Physical Medicine and Rehabilitation Admission H&P  No chief complaint on file.  :  HPI: 78 year old right-handed female with history of back surgery 2005. Presented 06/23/2011 with low back pain radiating to the left lower extremity. X-ray and imaging revealed lumbar L2-3 stenosis, kyphoscoliosis and radiculopathy. Underwent bilateral laminotomies and foraminotomies decompression lumbar L2-3 with decompression of L2 and L3 nerve roots as well as removal of previously placed hardware lumbar L3-5, pedicle screw fixation lumbar 2-3 per Dr. Danielle Dess 06/23/2011. Patient developed acute arterial bleeding during the case with vascular surgery consulted to Dr. Imogene Burn and underwent repair of avulsed lumbar artery. During the procedure she lost an estimated 1800 cc of blood and did receive 2 units of packed red blood cells. On 4/24/ 13 noted right-sided neglect. MRI of the brain showed multiple acute-subacute nonhemorrhagic small infarcts throughout the supratentorial and infratentorial region. Question watershed type infarcts with embolic infarcts not excluded. 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 Neurology was consulted advised to continue aspirin and Plavix as prior to admission which had initially been held for noted back surgery. Carotid Doppler studies have been ordered with results pending. Patient with chronic renal insufficiency creatinine 1.5-1.8 and monitored with latest creatinine 1.44 on 06/30/2011. Therapies have been initiated she is wearing a back corset when out of bed. Recommendations were made for physical medicine rehabilitation consult to consider inpatient rehabilitation services  Review of Systems  Cardiovascular: Positive for palpitations.  Gastrointestinal: Positive for constipation.  Musculoskeletal: Positive for back pain.  All other systems  reviewed and are negative  Past Medical History   Diagnosis  Date   .  Hyperlipidemia    .  Thyroid disease      hypo   .  CHF (congestive heart failure)    .  Mitral regurgitation    .  Tricuspid regurgitation    .  Anemia    .  UTI (lower urinary tract infection)    .  Arthritis      Osteoarthtistis   .  Elevated glucose    .  CVA (cerebral infarction)    .  Vitamin d deficiency    .  Hypothyroidism    .  Myocardial infarction    .  Chronic kidney failure      stage 3   .  CAD (coronary artery disease)      sees Dr. Ricka Burdock last 03/2011   .  Hypertension      Dr. Eden Emms cardiologist, Dr. Cheron Schaumann primary doctor    Past Surgical History   Procedure  Date   .  Abdominal hysterectomy    .  Back surgery    .  Coronary stent placement    .  Laparotomy  06/23/2011     Procedure: EXPLORATORY LAPAROTOMY; Surgeon: Barnett Abu, MD; Location: [facility]; Service: Neurosurgery; Laterality: Bilateral;    Family History   Problem  Relation  Age of Onset   .  Aneurysm  Mother    .  Heart disease  Mother    .  Heart attack  Father    .  Hypertension  Father    .  Anesthesia problems  Neg Hx     Social History: reports that she has never smoked. She does not have any smokeless tobacco history on file. She reports that she does not drink alcohol or use illicit drugs.  Allergies:  Allergies   Allergen  Reactions   .  Ace Inhibitors    .  Ezetimibe-Simvastatin      REACTION: Myalgia   .  Rosuvastatin      REACTION: aches   .  Penicillins  Rash    Medications Prior to Admission   Medication  Sig  Dispense  Refill   .  aspirin 325 MG tablet  Take 325 mg by mouth daily.     .  cholecalciferol (VITAMIN D) 1000 UNITS tablet  Take 1,000 Units by mouth daily.     .  clopidogrel (PLAVIX) 75 MG tablet  Take 75 mg by mouth daily.     .  Coenzyme Q10 (CO Q 10 PO)  Take 1 tablet by mouth daily.     .  meclizine (ANTIVERT) 25 MG tablet  Take 25 mg by mouth 3 (three) times daily as  needed. dizziness     .  methyldopa (ALDOMET) 250 MG tablet  Take 250 mg by mouth 2 (two) times daily.     .  metoprolol (LOPRESSOR) 100 MG tablet  Take 150 mg by mouth 2 (two) times daily.     .  nitroGLYCERIN (NITROSTAT) 0.4 MG SL tablet  Place 0.4 mg under the tongue every 5 (five) minutes as needed. For chest pain     .  pravastatin (PRAVACHOL) 20 MG tablet  Take 20 mg by mouth 2 (two) times daily.      Home:  Home Living  Lives With: Spouse  Type of Home: House  Home Access: Stairs to enter  Secretary/administrator of Steps: 2  Entrance Stairs-Rails: None  Home Layout: One level  Bathroom Shower/Tub: Pension scheme manager: Standard  Home Adaptive Equipment: Walker - standard;Walker - rolling  Functional History:  Prior Function  Meal Prep: Other (comment)  Light Housekeeping: Moderate  Able to Take Stairs?: Yes  Driving: No  Vocation: Retired  Comments: Per spouse pt was able to perform basic ADLs, transfers and gait without assist  Functional Status:  Mobility:  Bed Mobility  Bed Mobility: Rolling Right;Rolling Left;Right Sidelying to Sit;Sitting - Scoot to [place] of Bed  Rolling Right: 3: Mod assist;With rail  Rolling Right: Patient Percentage: 30%  Rolling Left: 3: Mod assist;With rail  Right Sidelying to Sit: 1: +2 Total assist;With rails;HOB flat  Right Sidelying to Sit: Patient Percentage: 60%  Sitting - Scoot to Edge of Bed: 4: Min assist  Sitting - Scoot to [place] of Bed: Patient Percentage: 20%  Sit to Sidelying Right: Not Tested (comment)  Sit to Sidelying Right: Patient Percentage: 20%  Scooting to [facility]: Not tested (comment)  Transfers  Transfers: Sit to Stand;Stand to Sit;Stand Pivot Transfers  Sit to Stand: 1: +2 Total assist;With upper extremity assist;From bed;With armrests  Sit to Stand: Patient Percentage: 50% (in [place] plus)  Stand to Sit: 1: +2 Total assist;With upper extremity assist;With armrests;To chair/3-in-1 (in [place] Plus)  Stand to Sit:  Patient Percentage: 50%  Stand Pivot Transfers: 1: +2 Total assist;With armrests  Stand Pivot Transfers: Patient Percentage: 60%  Transfer via Lift Equipment: Hydrographic surveyor  Ambulation/Gait  Ambulation/Gait Assistance: Not tested (comment)  Stairs: No  Wheelchair Mobility  Wheelchair Mobility: No  ADL:  ADL  Eating/Feeding: Simulated;+1 Total assistance  Where Assessed - Eating/Feeding: Chair  Grooming: Simulated;Maximal assistance  Where Assessed - Grooming: Supported sitting  Upper Body Bathing: Simulated;+1 Total assistance  Where Assessed - Upper Body Bathing: Sitting, chair  Lower Body Bathing: Simulated;+2  Total assistance (+2 needed for sit to stand and to perform task)  Where Assessed - Lower Body Bathing: Sit to stand from bed  Upper Body Dressing: Performed;+1 Total assistance  Where Assessed - Upper Body Dressing: Sitting, bed  Lower Body Dressing: Performed;+1 Total assistance (don socks)  Where Assessed - Lower Body Dressing: Sitting, bed  Toilet Transfer: Simulated;+2 Total assistance (pt=40%)  Toilet Transfer Method: Stand pivot  Toileting - Clothing Manipulation: Simulated;+1 Total assistance  Where Assessed - Toileting Clothing Manipulation: Standing  Toileting - Hygiene: Simulated;+1 Total assistance  Where Assessed - Toileting Hygiene: Standing  Tub/Shower Transfer: Not assessed  Equipment Used: Back brace;Rolling walker  Ambulation Related to ADLs: +2totalA (pt=40%) for stand-pivot from EOB to chair. Pt with kyphotic posture and attempting to sit throughout transfer despite VC and physical assist to remain standing upright. Also, manual facilitation to weight shift and advance bil LE  ADL Comments: Educated pt on back precautions as pt unable to recall any at beginning of session. Family present. Noted pt with LUE weakness- pt with < 1/2 ROM with shoulder flexion, unable to reach face and with 2-/5 grasp. Inc edema also noted in LUE. Pt and family encouraged to perfrom  AA/ROM to left hand and keep elevated on pillow to dec edema  Cognition:  Cognition  Arousal/Alertness: Awake/alert  Orientation Level: Oriented to person;Oriented to place;Oriented to situation;Oriented to time  Cognition  Overall Cognitive Status: Impaired  Area of Impairment: Attention;Following commands;Memory  Arousal/Alertness: Awake/alert  Orientation Level: Oriented X4 / Intact  Behavior During Session: [facility] for tasks performed  Current Attention Level: Sustained  Attention - Other Comments: extremely slow to respond verbally and motorically  Memory: Decreased recall of precautions  Memory Deficits: pt unable to recall any precautions or situation  Following Commands: Follows one step commands consistently  Cognition - Other Comments: Slow to respond.  Blood pressure 156/64, pulse 67, temperature 98.1 F (36.7 C), temperature source Oral, resp. rate 16, height 5' 7.5" (1.715 m), weight 77.8 kg (171 lb 8.3 oz), SpO2 95.00%.  Physical Exam  HENT:  Head: Normocephalic and atraumatic. Oral mucosa pink and moist Eyes: Conjunctivae and EOM are normal. Pupils are equal, round, and reactive to light.  Neck: Neck supple. No thyromegaly present.  Cardiovascular: Regular rhythm.  Pulmonary/Chest: Breath sounds normal. She has no wheezes.  Abdominal: She exhibits no distension. There is no tenderness.  Musculoskeletal:  Left upper extremity with 1+ edema. somebruising noted on the arms and legs. Mid back tender with spasm. Op site intact. Neurological: closes eyes frequently. Does follow simple commands. No focal CN signs Needs some cues for date of birth and place. Affect is flat and slow to respond to some questions. She was able to name a picture of her grandson. She could name familiar objects. Follow two-step simple commands. She does have some decreased awareness of her deficits. Moves right side more than left but is inconsistent. May have some early tone in LUE. Seems to have motor  apraxia and delayed processing. Withdraws to pain on all 4's. DTR's 1+ Skin:  Back incision clean and dressed  Psychiatric: poor insight and awareness. A little anxious. Pain is an issue with movement and anxiety exacerbates that. Results for orders placed during the hospital encounter of 06/23/11 (from the past 48 hour(s))   BASIC METABOLIC PANEL Status: Abnormal    Collection Time    07/01/11 4:00 AM   Component  Value  Range  Comment    Sodium  135  135 - 145 (mEq/L)     Potassium  3.8  3.5 - 5.1 (mEq/L)     Chloride  102  96 - 112 (mEq/L)     CO2  23  19 - 32 (mEq/L)     Glucose, Bld  116 (*)  70 - 99 (mg/dL)     BUN  28 (*)  6 - 23 (mg/dL)     Creatinine, Ser  9.56 (*)  0.50 - 1.10 (mg/dL)     Calcium  8.6  8.4 - 10.5 (mg/dL)     GFR calc non Af Amer  31 (*)  >90 (mL/min)     GFR calc Af Amer  36 (*)  >90 (mL/min)    MAGNESIUM Status: Normal    Collection Time    07/01/11 4:00 AM   Component  Value  Range  Comment    Magnesium  1.6  1.5 - 2.5 (mg/dL)    PHOSPHORUS Status: Normal    Collection Time    07/01/11 4:00 AM   Component  Value  Range  Comment    Phosphorus  3.1  2.3 - 4.6 (mg/dL)    HEMOGLOBIN O1H Status: Abnormal    Collection Time    07/01/11 3:10 PM   Component  Value  Range  Comment    Hemoglobin A1C  5.7 (*)  <5.7 (%)     Mean Plasma Glucose  117 (*)  <117 (mg/dL)    LIPID PANEL Status: Abnormal    Collection Time    07/02/11 4:12 AM   Component  Value  Range  Comment    Cholesterol  154  0 - 200 (mg/dL)     Triglycerides  086  <150 (mg/dL)     HDL  28 (*)  >57 (mg/dL)     Total CHOL/HDL Ratio  5.5      VLDL  23  0 - 40 (mg/dL)     LDL Cholesterol  846 (*)  0 - 99 (mg/dL)     Mr Maxine Glenn Head Wo Contrast  07/01/2011 *RADIOLOGY REPORT* Clinical Data: Confusion and weakness. Back surgery 06/23/2011. MRI HEAD WITHOUT CONTRAST MRA HEAD WITHOUT CONTRAST Technique: Multiplanar, multiecho pulse sequences of the brain and surrounding structures were obtained according to  standard protocol without intravenous contrast. Angiographic images of the head were obtained using MRA technique without contrast. Comparison: 06/25/2011 CT. MRI HEAD Findings: Multiple acute/subacute non hemorrhagic small infarcts throughout the supratentorial and infratentorial region. In the supratentorial region, these have predominately a parasagittal distribution raising the possibility of watershed infarcts. The cerebellar involvement may also involve watershed distribution although there are portions that do not appear to be in a watershed distribution and therefore embolic disease is also a consideration. Remote small infarcts basal ganglion, thalami, corona radiata/centrum semiovale and cerebellar bilaterally. Prominent small vessel disease type changes. Global atrophy without hydrocephalus. No intracranial mass lesion noted on this unenhanced exam. IMPRESSION: Multiple acute/subacute non hemorrhagic small infarcts throughout the supratentorial and infratentorial region. Question watershed type infarcts with embolic infarcts not excluded as noted above. MRA HEAD Findings: Mild to moderate narrowing and irregularity cavernous segment of the internal carotid artery bilaterally. Mild to moderate narrowing A1 segment left anterior cerebral artery. Mild to moderate narrowing proximal M1 segment left middle cerebral artery. Middle cerebral artery mild to moderate branch vessel irregularity. 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 Moderate to marked focal stenosis distal right vertebral artery. Focal loss of signal distal left  vertebral artery consistent with high-grade stenosis/occlusion. Nonvisualization PICAs. Mild to moderate narrowing mid to distal basilar artery. Nonvisualization of left AICA. Bulbous appearance of the basilar tip without discrete saccular aneurysm. Moderate to marked stenosis proximal and distal aspect of the posterior cerebral  artery bilaterally. Moderate narrowing superior cerebellar artery bilaterally. IMPRESSION: Prominent intracranial atherosclerotic type changes as detailed above. 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 Critical Value/emergent results were called by telephone at the time of interpretation on 07/01/2011 at 10:40 am to Dr. Danielle Dess, who verbally acknowledged these results. Original Report Authenticated By: Fuller Canada, M.D.  Mr Brain Wo Contrast  07/01/2011 *RADIOLOGY REPORT* Clinical Data: Confusion and weakness. Back surgery 06/23/2011. MRI HEAD WITHOUT CONTRAST MRA HEAD WITHOUT CONTRAST Technique: Multiplanar, multiecho pulse sequences of the brain and surrounding structures were obtained according to standard protocol without intravenous contrast. Angiographic images of the head were obtained using MRA technique without contrast. Comparison: 06/25/2011 CT. MRI HEAD Findings: Multiple acute/subacute non hemorrhagic small infarcts throughout the supratentorial and infratentorial region. In the supratentorial region, these have predominately a parasagittal distribution raising the possibility of watershed infarcts. The cerebellar involvement may also involve watershed distribution although there are portions that do not appear to be in a watershed distribution and therefore embolic disease is also a consideration. Remote small infarcts basal ganglion, thalami, corona radiata/centrum semiovale and cerebellar bilaterally. Prominent small vessel disease type changes. Global atrophy without hydrocephalus. No intracranial mass lesion noted on this unenhanced exam. IMPRESSION: Multiple acute/subacute non hemorrhagic small infarcts throughout the supratentorial and infratentorial region. Question watershed type infarcts with embolic infarcts not excluded as noted above. MRA HEAD Findings: Mild to moderate narrowing and irregularity cavernous segment of the  internal carotid artery bilaterally. Mild to moderate narrowing A1 segment left anterior cerebral artery. Mild to moderate narrowing proximal M1 segment left middle cerebral artery. Middle cerebral artery mild to moderate branch vessel irregularity. 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 Moderate to marked focal stenosis distal right vertebral artery. Focal loss of signal distal left vertebral artery consistent with high-grade stenosis/occlusion. Nonvisualization PICAs. Mild to moderate narrowing mid to distal basilar artery. Nonvisualization of left AICA. Bulbous appearance of the basilar tip without discrete saccular aneurysm. Moderate to marked stenosis proximal and distal aspect of the posterior cerebral artery bilaterally. Moderate narrowing superior cerebellar artery bilaterally. IMPRESSION: Prominent intracranial atherosclerotic type changes as detailed above. 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 Critical Value/emergent results were called by telephone at the time of interpretation on 07/01/2011 at 10:40 am to Dr. Danielle Dess, who verbally acknowledged these results. Original Report Authenticated By: Fuller Canada, M.D.  Dg Abd 2 Views  07/01/2011 *RADIOLOGY REPORT* Clinical Data: Ileus, status post aortic repair. ABDOMEN - 2 VIEW Comparison: 06/23/2011. Findings: Supine and right-side up decubitus views. Right side up decubitus view demonstrates midline laparotomy changes. Lumbar spine fixation. No free intraperitoneal air or significant air fluid levels. Artifact about the left side of the abdomen. Supine views demonstrate no significant bowel dilatation. Gas within normal caliber colon. Distal gas. No pneumatosis. Mild left base atelectasis. Cannot exclude small left pleural effusion. IMPRESSION: No acute findings. Original Report Authenticated By: Consuello Bossier, M.D.   Post  Admission Physician Evaluation:  1. Functional deficits secondary to lumbar stenosis with radiculopathy s/p lumbar decompression, removal of hardware and fusion. Course complicated by bilateral watershed infarcts. . 2. Patient is  admitted to receive collaborative, interdisciplinary care between the physiatrist, rehab nursing staff, and therapy team. 3. Patient's level of medical complexity and substantial therapy needs in context of that medical necessity cannot be provided at a lesser intensity of care such as a SNF. 4. Patient has experienced substantial functional loss from his/her baseline which was documented above under the "Functional History" and "Functional Status" headings. Judging by the patient's diagnosis, physical exam, and functional history, the patient has potential for functional progress which will result in measurable gains while on inpatient rehab. These gains will be of substantial and practical use upon discharge in facilitating mobility and self-care at the household level. 5. Physiatrist will provide 24 hour management of medical needs as well as oversight of the therapy plan/treatment and provide guidance as appropriate regarding the interaction of the two. 6. 24 hour rehab nursing will assist with bladder management, bowel management, safety, skin/wound care, disease management, medication administration, pain management and patient education and help integrate therapy concepts, techniques,education, etc. 7. PT will assess and treat for: LES, ROM, safety, back precautions, NMR, fxnl mobility. Goals are: min to mod assist. 8. OT will assess and treat for: UES, ROM, NMR, fxnl mobility, safety, CPT. Goals are: min to mod assist. 9. SLP will assess and treat for: communication, cognition. Goals are: supervision to min assist. 10. Case Management and Social Worker will assess and treat for psychological issues and discharge planning. 11. Team conference will be held weekly to assess  progress toward goals and to determine barriers to discharge. 12. Patient will receive at least 3 hours of therapy per day at least 5 days per week. 13. ELOS and Prognosis: 3+ weeks good Medical Problem List and Plan:  1. Lumbar stenosis with radiculopathy. Status post bilateral laminotomies with decompression and removal of previous hardware. Complicated by acute arterial bleeding with vascular repair 06/23/2011  2. 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 Continue aspirin Plavix. Carotid Dopplers are pending  3. DVT Prophylaxis/Anticoagulation: SCDs. Monitor for any signs of deep vein thrombosis  4. Hypertension. aldomet 250 mg twice a day, Lopressor 150 mg twice a day. Monitor with increased mobility  5. Hypothyroidism. Synthroid  6. Mood/postoperative confusion. Presently on low dose Risperdal. Will continue to monitor. Speech therapy to followup for cognitive eval after recent stroke. Need to restore sleep wake cycle. Be careful with pain medications  Ivory Broad, MD

## 2011-07-03 DIAGNOSIS — M4716 Other spondylosis with myelopathy, lumbar region: Secondary | ICD-10-CM

## 2011-07-03 DIAGNOSIS — M5416 Radiculopathy, lumbar region: Secondary | ICD-10-CM

## 2011-07-03 DIAGNOSIS — Z5189 Encounter for other specified aftercare: Secondary | ICD-10-CM

## 2011-07-03 DIAGNOSIS — I6330 Cerebral infarction due to thrombosis of unspecified cerebral artery: Secondary | ICD-10-CM

## 2011-07-03 LAB — COMPREHENSIVE METABOLIC PANEL
Alkaline Phosphatase: 103 U/L (ref 39–117)
BUN: 25 mg/dL — ABNORMAL HIGH (ref 6–23)
Calcium: 8.7 mg/dL (ref 8.4–10.5)
Creatinine, Ser: 1.29 mg/dL — ABNORMAL HIGH (ref 0.50–1.10)
GFR calc Af Amer: 44 mL/min — ABNORMAL LOW (ref >90)
Glucose, Bld: 110 mg/dL — ABNORMAL HIGH (ref 70–99)
Potassium: 3.7 mEq/L (ref 3.5–5.1)
Total Protein: 5.4 g/dL — ABNORMAL LOW (ref 6.0–8.3)

## 2011-07-03 LAB — DIFFERENTIAL
Eosinophils Absolute: 0.2 10*3/uL (ref 0.0–0.7)
Lymphs Abs: 1.2 10*3/uL (ref 0.7–4.0)
Neutrophils Relative %: 76 % (ref 43–77)

## 2011-07-03 LAB — CBC
HCT: 31.4 % — ABNORMAL LOW (ref 36.0–46.0)
Hemoglobin: 10.5 g/dL — ABNORMAL LOW (ref 12.0–15.0)
MCH: 28.2 pg (ref 26.0–34.0)
MCHC: 33.4 g/dL (ref 30.0–36.0)
MCV: 84.2 fL (ref 78.0–100.0)

## 2011-07-03 NOTE — Evaluation (Signed)
Recreational Therapy Assessment and Plan  Patient Details  Name: Jean [name] MRN: [id number] Date of Birth: [birth year] Today's Date: 07/03/2011  Rehab Potential: Good ELOS: 3 weeks   Assessment Clinical Impression: 78 year old right-handed female with history of back surgery 2005. Presented 06/23/2011 with low back pain radiating to the left lower extremity. X-ray and imaging revealed lumbar L2-3 stenosis, kyphoscoliosis and radiculopathy. Underwent bilateral laminotomies and foraminotomies decompression lumbar L2-3 with decompression of L2 and L3 nerve roots as well as removal of previously placed hardware lumbar L3-5, pedicle screw fixation lumbar 2-3 per Dr. Danielle Dess 06/23/2011. Patient developed acute arterial bleeding during the case with vascular surgery consulted to Dr. Imogene Burn and underwent repair of avulsed lumbar artery. During the procedure she lost an estimated 1800 cc of blood and did receive 2 units of packed red blood cells. On 4/24/ 13 noted right-sided neglect. MRI of the brain showed multiple acute-subacute nonhemorrhagic small infarcts throughout the supratentorial and infratentorial region. Question watershed type infarcts with embolic infarcts not excluded. 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 Neurology was consulted advised to continue aspirin and Plavix as prior to admission which had initially been held for noted back surgery. Carotid Doppler studies have been ordered with results pending. Patient with chronic renal insufficiency creatinine 1.5-1.8 and monitored with latest creatinine 1.44 on 06/30/2011. Therapies have been initiated she is wearing a back corset when out of bed. Recommendations were made for physical medicine rehabilitation consult to consider inpatient rehabilitation services.  PT transferred to [facility] 07/02/11.   Pt  presents with decreased activity tolerance,decreased functional mobility, decreased balance, decreased cognition/awareness/safety limiting pt's independence with leisure/community pursuits. Leisure History/Participation Premorbid leisure interest/current participation: Ashby Dawes - Flower gardening;Crafts - Sewing;Crafts - Other (Comment);Community - Public relations account executive (out to eat) Expression Interests: Music (Comment) Other Leisure Interests: Television Leisure Participation Style: With Family/Friends Psychosocial / Spiritual Spiritual Interests: Church Patient agreeable to Pet Therapy: Yes Does patient have pets?: Yes Social interaction - Mood/Behavior: Cooperative Firefighter Appropriate for Education?: Yes Recreational Therapy Orientation Orientation -Reviewed with patient: Available activity resources Strengths/Weaknesses Patient Strengths/Abilities: Willingness to participate Patient weaknesses: Physical limitations  Plan Rec Therapy Plan Is patient appropriate for Therapeutic Recreation?: Yes Rehab Potential: Good Treatment times per week: Min 1 time per week >20 minutes Estimated Length of Stay: 3 weeks TR Treatment/Interventions: Adaptive equipment instruction;1:1 session;Balance/vestibular training;Community reintegration;Functional mobility training  Recommendations for other services: None  Discharge Criteria: Patient will be discharged from TR if patient refuses treatment 3 consecutive times without medical reason.  If treatment goals not met, if there is a change in medical status, if patient makes no progress towards goals or if patient is discharged from hospital.  The above assessment, treatment plan, treatment alternatives and goals were discussed and mutually agreed upon: by patient  [name] 07/03/2011, 9:21 AM

## 2011-07-03 NOTE — Evaluation (Signed)
Clinical/Bedside Swallow Evaluation Patient Details  Name: Jean [name] MRN: [id number] DOB: 07-04-1928  Today's Date: 07/03/2011 Time: 2956-2130 Time Calculation (min): 60 min  Skilled Therapeutic Intervention: Administered BSE, please see below for details.   Past Medical History:  Past Medical History  Diagnosis Date  . Hyperlipidemia   . Thyroid disease     hypo  . CHF (congestive heart failure)   . Mitral regurgitation   . Tricuspid regurgitation   . Anemia   . UTI (lower urinary tract infection)   . Arthritis     Osteoarthtistis  . Elevated glucose   . CVA (cerebral infarction)   . Vitamin d deficiency   . Hypothyroidism   . Myocardial infarction   . Chronic kidney failure     stage 3  . CAD (coronary artery disease)     sees Dr. Ricka Burdock last 03/2011  . Hypertension     Dr. Eden Emms cardiologist, Dr. Cheron Schaumann primary doctor   Past Surgical History:  Past Surgical History  Procedure Date  . Abdominal hysterectomy   . Back surgery   . Coronary stent placement   . Laparotomy 06/23/2011    Procedure: EXPLORATORY LAPAROTOMY;  Surgeon: Barnett Abu, MD;  Location: [facility];  Service: Neurosurgery;  Laterality: Bilateral;   HPI: 78 y.o. year old female with recent admission to the hospital on 06/23/2011 with low back pain radiating to the left lower extremity. X-ray and imaging revealed lumbar L2-3 stenosis, kyphoscoliosis and radiculopathy. Underwent bilateral laminotomies and foraminotomies decompression lumbar L2-3 with decompression of L2 and L3 nerve roots as well as removal of previously placed hardware lumbar L3-5, pedicle screw fixation lumbar 2-3. Patient developed acute arterial bleeding during the case with vascular surgery and underwent repair of avulsed lumbar artery. On 06/25/11 noted right-sided neglect. MRI of the brain showed multiple acute-subacute nonhemorrhagic small infarcts throughout the supratentorial and infratentorial region. Question watershed  type infarcts with embolic infarcts not excluded. 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 Patient with chronic renal insufficiency. Patient transferred to CIR on 07/02/2011 and BSE administered to assess current swallowing function and safety with current diet after RN reports of difficulty swallowing pills.   Assessment/Recommendations/Treatment Plan Suspected Esophageal Findings Suspected Esophageal Findings: Belching  SLP Assessment Clinical Impression Statement: Pt demonstrates inconsistent s/s of aspiration with both thin liquids and nectar-thick liquids with intermittent throat clearing and cough X 1,however, pt consistently demonstrating throat clearing without trials and during functional conversation. Pt also demonstrating consistent belching after each sip of liquid and RN reports difficulty taking medications whole with liquids indicative of a susepcted esophageal component. Following chart review, pt has been tolerating current diet for ~4 days without indications of aspiration PNA. Recommend Dys. 3 textures and thin liquids with f/u MBSS to assess swallowing safety and possible esophageal based dysphagia.  Risk for Aspiration: Mild Other Related Risk Factors: Previous CVA;Cognitive impairment  Swallow Evaluation Recommendations Recommended Consults: MBS Diet Recommendations: Dysphagia 3 (Mechanical Soft);Thin liquid Liquid Administration via: Cup Medication Administration: Crushed with puree Supervision: Full supervision/cueing for compensatory strategies Compensations: Slow rate;Small sips/bites Postural Changes and/or Swallow Maneuvers: Seated upright 90 degrees Oral Care Recommendations: Oral care BID Follow up Recommendations: Home health SLP  Treatment Plan Treatment Plan Recommendations: Therapy as outlined in treatment plan  below Speech Therapy Frequency: min 5x/week Treatment Duration: 3 weeks Interventions: Aspiration precaution training;Trials of upgraded texture/liquids;Patient/family education;Compensatory  techniques;Diet toleration management by SLP  Prognosis Prognosis for Safe Diet Advancement: Good  Individuals Consulted Consulted and Agree with Results and Recommendations: Patient;Family member/caregiver;RN Family Member Consulted: husband   Swallowing Goals  See cognitive linguistic evaluation  Swallow Study Prior Functional Status  Pt consuming regular textures and thin liquids. Reports no history of dysphagia or esophageal issues.   Oral Motor/Sensory Function  Overall Oral Motor/Sensory Function: Appears within functional limits for tasks assessed  Consistency Results  Ice Chips Ice chips: Not tested  Thin Liquid Thin Liquid: Impaired Presentation: Cup Pharyngeal  Phase Impairments: Cough - Delayed;Throat Clearing - Delayed Other Comments: Pt demonstrated intermittent throat clear and cough X 1 throughout session, however, pt also demonstrating consistent throat clear without trials during converation. Pt reports it is due to phlelgm  Nectar Thick Liquid Nectar Thick Liquid: Impaired Presentation: Cup Pharyngeal Phase Impairments: Throat Clearing - Delayed Other Comments: Pt demonstrated delayed throat clear X 1  Honey Thick Liquid Honey Thick Liquid: Not tested  Puree Puree: Within functional limits  Solid Solid: Impaired Oral Phase Functional Implications: Oral residue Other Comments: Pt wil mild oral residue and prolonged mastication. Pt's husband reports pt has always been "a slow eater."    Manuel Lawhead 07/03/2011,5:43 PM

## 2011-07-03 NOTE — Progress Notes (Signed)
Patient information reviewed and entered into [facility] system by Naveed Humphres, RN, CRRN, PPS Coordinator.  Information including medical coding and functional independence measure will be reviewed and updated through discharge.     Per nursing patient was given "Data Collection Information Summary for Patients in Inpatient Rehabilitation Facilities with attached "Privacy Act Statement-Health Care Records" upon admission.   

## 2011-07-03 NOTE — Progress Notes (Signed)
Occupational Therapy Session Note  Patient Details  Name: Jean [name] MRN: [id number] Date of Birth: 1928/12/10  Today's Date: 07/03/2011 Time: 4098-1191 Time Calculation (min): 31 min  Short Term Goals: Week 1:  OT Short Term Goal 1 (Week 1): Pt will sit EOB for 3 min wtih min A in prep for ADL tasks OT Short Term Goal 2 (Week 1): Pt will demonstrate sustained attention with min A during funcitonal grooming tasks OT Short Term Goal 3 (Week 1): Pt will transfer with LRAD with max +1 for basic transfers OT Short Term Goal 4 (Week 1): Pt will don shirt with mod A in sitting   Skilled Therapeutic Interventions/Progress Updates:    Pt asleep at start of session, husband also present.  Able to arouse and transfer to EOB with max assist.  Static sitting mod assist with bilateral UE support.  Pt tends to keep her head and neck flexed in sitting but can achieve neutral extension with mod instructional cueing.  While sitting pt unable to correctly answer orientation questions regarding place, or situation.  Attempted sit to stand but pt only able to achieve 1/2 stand with total assist from therapist.  Performed scooting up to EOB with total assist before returning to supine.  After 5 min delay pt able to recall place and situation (with min questioning cues).  Therapy Documentation Precautions:  Precautions Precautions: Back;Fall Precaution Comments: required total A for recall of 3/3 precautions Required Braces or Orthoses: Spinal Brace Spinal Brace: Lumbar corset Restrictions Weight Bearing Restrictions: No  Pain: Pain Assessment Pain Assessment: No/denies pain ADL: See FIM for current functional status  Therapy/Group: Individual Therapy  Hilary Milks 07/03/2011, 4:00 PM

## 2011-07-03 NOTE — Progress Notes (Signed)
Patient ID: Jean [name], female   DOB: April 06, 1928, 78 y.o.   MRN: [id number] Subjective/Complaints: Fair night. Back a little better. Took the robaxin this am and thinks it helped. Not a breakfast eater Ros performed and otherwise negative  Objective: Vital Signs: Blood pressure 145/89, pulse 74, temperature 98 F (36.7 C), temperature source Oral, resp. rate 19, height 5\' 7"  (1.702 m), weight 76.2 kg (167 lb 15.9 oz), SpO2 99.00%. Mr Maxine Glenn Head Wo Contrast  07/01/2011  *RADIOLOGY REPORT*  Clinical Data:  Confusion and weakness.  Back surgery 06/23/2011.  MRI HEAD WITHOUT CONTRAST MRA HEAD WITHOUT CONTRAST  Technique: Multiplanar, multiecho pulse sequences of the brain and surrounding structures were obtained according to standard protocol without intravenous contrast.  Angiographic images of the head were obtained using MRA technique without contrast.  Comparison: 06/25/2011 CT.  MRI HEAD  Findings:  Multiple acute/subacute non hemorrhagic small infarcts throughout the supratentorial and infratentorial region.  In the supratentorial region, these have predominately a parasagittal distribution raising the possibility of watershed infarcts.  The cerebellar involvement may also involve watershed distribution although there are portions that do not appear to be in a watershed distribution and therefore embolic disease is also a consideration.  Remote small infarcts basal ganglion, thalami, corona radiata/centrum semiovale and cerebellar bilaterally.  Prominent small vessel disease type changes.  Global atrophy without hydrocephalus.  No intracranial mass lesion noted on this unenhanced exam.  IMPRESSION: Multiple acute/subacute non hemorrhagic small infarcts throughout the supratentorial and infratentorial region. Question watershed type infarcts with embolic infarcts not excluded as noted above.  MRA HEAD  Findings: Mild to moderate narrowing and irregularity cavernous segment of the internal carotid artery  bilaterally.  Mild to moderate narrowing A1 segment left anterior cerebral artery.  Mild to moderate narrowing proximal M1 segment left middle cerebral artery.  Middle cerebral artery mild to moderate branch vessel irregularity.  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  Moderate to marked focal stenosis distal right vertebral artery.  Focal loss of signal distal left vertebral artery consistent with high-grade stenosis/occlusion.  Nonvisualization PICAs.  Mild to moderate narrowing mid to distal basilar artery.  Nonvisualization of left AICA.  Bulbous appearance of the basilar tip without discrete saccular aneurysm.  Moderate to marked stenosis proximal and distal aspect of the posterior cerebral artery bilaterally.  Moderate narrowing superior cerebellar artery bilaterally.  IMPRESSION: Prominent intracranial atherosclerotic type changes as detailed above.  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  Critical Value/emergent results were called by telephone at the time of interpretation on 07/01/2011  at 10:40 am  to  Dr. Danielle Dess, who verbally acknowledged these results.  Original Report Authenticated By: Fuller Canada, M.D.   Mr Brain Wo Contrast  07/01/2011  *RADIOLOGY REPORT*  Clinical Data:  Confusion and weakness.  Back surgery 06/23/2011.  MRI HEAD WITHOUT CONTRAST MRA HEAD WITHOUT CONTRAST  Technique: Multiplanar, multiecho pulse sequences of the brain and surrounding structures were obtained according to standard protocol without intravenous contrast.  Angiographic images of the head were obtained using MRA technique without contrast.  Comparison: 06/25/2011 CT.  MRI HEAD  Findings:  Multiple acute/subacute non hemorrhagic small infarcts throughout the supratentorial and infratentorial region.  In the supratentorial region, these have predominately a parasagittal distribution raising  the possibility of watershed infarcts.  The cerebellar involvement may also involve watershed distribution although there are portions that do not appear to be in  a watershed distribution and therefore embolic disease is also a consideration.  Remote small infarcts basal ganglion, thalami, corona radiata/centrum semiovale and cerebellar bilaterally.  Prominent small vessel disease type changes.  Global atrophy without hydrocephalus.  No intracranial mass lesion noted on this unenhanced exam.  IMPRESSION: Multiple acute/subacute non hemorrhagic small infarcts throughout the supratentorial and infratentorial region. Question watershed type infarcts with embolic infarcts not excluded as noted above.  MRA HEAD  Findings: Mild to moderate narrowing and irregularity cavernous segment of the internal carotid artery bilaterally.  Mild to moderate narrowing A1 segment left anterior cerebral artery.  Mild to moderate narrowing proximal M1 segment left middle cerebral artery.  Middle cerebral artery mild to moderate branch vessel irregularity.  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  Moderate to marked focal stenosis distal right vertebral artery.  Focal loss of signal distal left vertebral artery consistent with high-grade stenosis/occlusion.  Nonvisualization PICAs.  Mild to moderate narrowing mid to distal basilar artery.  Nonvisualization of left AICA.  Bulbous appearance of the basilar tip without discrete saccular aneurysm.  Moderate to marked stenosis proximal and distal aspect of the posterior cerebral artery bilaterally.  Moderate narrowing superior cerebellar artery bilaterally.  IMPRESSION: Prominent intracranial atherosclerotic type changes as detailed above.  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  Critical Value/emergent results were called by telephone at the time of interpretation on  07/01/2011  at 10:40 am  to  Dr. Danielle Dess, who verbally acknowledged these results.  Original Report Authenticated By: Fuller Canada, M.D.   Dg Abd 2 Views  07/01/2011  *RADIOLOGY REPORT*  Clinical Data: Ileus, status post aortic repair.  ABDOMEN - 2 VIEW  Comparison: 06/23/2011.  Findings: Supine and right-side up decubitus views.  Right side up decubitus view demonstrates midline laparotomy changes.  Lumbar spine fixation.  No free intraperitoneal air or significant air fluid levels.  Artifact about the left side of the abdomen.  Supine views demonstrate no significant bowel dilatation.  Gas within normal caliber colon.  Distal gas.  No pneumatosis.  Mild left base atelectasis.  Cannot exclude small left pleural effusion.  IMPRESSION: No acute findings.  Original Report Authenticated By: Consuello Bossier, M.D.   No results found for this basename: WBC:2,HGB:2,HCT:2,PLT:2 in the last 72 hours  Basename 07/01/11 0400  NA 135  K 3.8  CL 102  CO2 23  GLUCOSE 116*  BUN 28*  CREATININE 1.53*  CALCIUM 8.6   CBG (last 3)  No results found for this basename: GLUCAP:3 in the last 72 hours  Wt Readings from Last 3 Encounters:  07/02/11 76.2 kg (167 lb 15.9 oz)  07/02/11 77.8 kg (171 lb 8.3 oz)  07/02/11 77.8 kg (171 lb 8.3 oz)    Physical Exam:  General appearance: alert, cooperative, appears stated age and no distress Head: Normocephalic, without obvious abnormality, atraumatic Eyes: conjunctivae/corneas clear. PERRL, EOM's intact. Fundi benign. Ears: normal TM's and external ear canals both ears Nose: Nares normal. Septum midline. Mucosa normal. No drainage or sinus tenderness. Throat: lips, mucosa, and tongue normal; teeth and gums normal Neck: no adenopathy, no carotid bruit, no JVD, supple, symmetrical, trachea midline and thyroid not enlarged, symmetric, no tenderness/mass/nodules Back: tender superior to incision. posture poor.  Resp: clear to auscultation bilaterally Cardio: regular  rate and rhythm, S1, S2 normal, no murmur, click, rub or gallop GI: soft, non-tender; bowel sounds normal; no masses,  no  organomegaly Extremities: extremities normal, atraumatic, no cyanosis or edema Pulses: 1+ edema lue Skin: Skin color, texture, turgor normal. No rashes or lesions Neurologic: slow to process. Gross, generalized weakness--let more than right (inconsistent) no focal CN findings. Limited insight and awareness. Incision/Wound: clean and intact   Assessment/Plan: 1. Functional deficits secondary to lumbar stenosis, radiculopathy, with post op watershed infarct which require 3+ hours per day of interdisciplinary therapy in a comprehensive inpatient rehab setting. Physiatrist is providing close team supervision and 24 hour management of active medical problems listed below. Physiatrist and rehab team continue to assess barriers to discharge/monitor patient progress toward functional and medical goals. FIM:                   Comprehension Comprehension Mode: Auditory Comprehension: 2-Understands basic 25 - 49% of the time/requires cueing 51 - 75% of the time  Expression Expression Mode: Verbal Expression: 2-Expresses basic 25 - 49% of the time/requires cueing 50 - 75% of the time. Uses single words/gestures.  Social Interaction Social Interaction: 4-Interacts appropriately 75 - 89% of the time - Needs redirection for appropriate language or to initiate interaction.  Problem Solving Problem Solving: 2-Solves basic 25 - 49% of the time - needs direction more than half the time to initiate, plan or complete simple activities  Memory Memory: 2-Recognizes or recalls 25 - 49% of the time/requires cueing 51 - 75% of the time  1. Lumbar stenosis with radiculopathy. Status post bilateral laminotomies with decompression and removal of previous hardware. Complicated by acute arterial bleeding with vascular repair 06/23/2011  2. Multiple acute to subacute nonhemorrhagic  infarcts watershed distribution. Continue aspirin Plavix. Carotid Dopplers are pending  3. DVT Prophylaxis/Anticoagulation: SCDs. Monitor for any signs of deep vein thrombosis  4. Hypertension. aldomet 250 mg twice a day, Lopressor 150 mg twice a day. Monitor with increased mobility  5. Hypothyroidism. Synthroid  6. Mood/postoperative confusion. Presently on low dose Risperdal. Will continue to monitor. Speech therapy to followup for cognitive eval after recent stroke. Need to restore sleep wake cycle. Be careful with pain medications 7. FEN: mild renal insufficiency  -push po fluids and po intake as a whole  -recheck bmet tomorrow     LOS (Days) 1 A FACE TO FACE EVALUATION WAS PERFORMED  Junette Bernat T 07/03/2011, 6:53 AM

## 2011-07-03 NOTE — Evaluation (Signed)
Occupational Therapy Assessment and Plan  Patient Details  Name: Jean [name] MRN: [id number] Date of Birth: 1928-08-29  OT Diagnosis: abnormal posture, acute pain, cognitive deficits and muscle weakness (generalized) Rehab Potential: Rehab Potential: Good ELOS: 3-4weeks   Today's Date: 07/03/2011 Time: 0730-0830 Time Calculation (min): 60 min  Problem List:  Patient Active Problem List  Diagnoses  . HYPOTHYROIDISM, UNSPECIFIED  . VITAMIN D DEFICIENCY  . HYPERLIPIDEMIA  . HYPERTENSION  . CORONARY ARTERY DISEASE  . CORONARY ATHEROSCLEROSIS NATIVE CORONARY ARTERY  . GASTROENTERITIS  . RENAL INSUFFICIENCY  . INGROWN TOENAIL  . OSTEOARTHRITIS  . CRAMPS,LEG  . SYNCOPE  . COUGH  . FIBROCYSTIC BREAST DISEASE  . WRIST PAIN  . ANKLE PAIN  . UNSPECIFIED CLOSED FRACTURE OF ANKLE  . ANKLE SPRAIN, RIGHT  . Hip pain  . LBP (low back pain)  . Status post lumbar surgery  . Aortic injury, S/P vascular surgery  . ARF (acute renal failure)  . Delirium, acute  . Pulmonary edema  . Hypokalemia  . Lumbar radiculopathy    Past Medical History:  Past Medical History  Diagnosis Date  . Hyperlipidemia   . Thyroid disease     hypo  . CHF (congestive heart failure)   . Mitral regurgitation   . Tricuspid regurgitation   . Anemia   . UTI (lower urinary tract infection)   . Arthritis     Osteoarthtistis  . Elevated glucose   . CVA (cerebral infarction)   . Vitamin d deficiency   . Hypothyroidism   . Myocardial infarction   . Chronic kidney failure     stage 3  . CAD (coronary artery disease)     sees Dr. Ricka Burdock last 03/2011  . Hypertension     Dr. Eden Emms cardiologist, Dr. Cheron Schaumann primary doctor   Past Surgical History:  Past Surgical History  Procedure Date  . Abdominal hysterectomy   . Back surgery   . Coronary stent placement   . Laparotomy 06/23/2011    Procedure: EXPLORATORY LAPAROTOMY;  Surgeon: Barnett Abu, MD;  Location: [facility];  Service: Neurosurgery;   Laterality: Bilateral;    Assessment & Plan Clinical Impression: Patient is a 78 y.o. year old right-handed female with history of back surgery 2005. Presented 06/23/2011 with low back pain radiating to the left lower extremity. X-ray and imaging revealed lumbar L2-3 stenosis, kyphoscoliosis and radiculopathy. Underwent bilateral laminotomies and foraminotomies decompression lumbar L2-3 with decompression of L2 and L3 nerve roots as well as removal of previously placed hardware lumbar L3-5, pedicle screw fixation lumbar 2-3 per Dr. Danielle Dess 06/23/2011. Patient developed acute arterial bleeding during the case with vascular surgery consulted to Dr. Imogene Burn and underwent repair of avulsed lumbar artery. During the procedure she lost an estimated 1800 cc of blood and did receive 2 units of packed red blood cells. On 4/24/ 13 noted right-sided neglect. MRI of the brain showed multiple acute-subacute nonhemorrhagic small infarcts throughout the supratentorial and infratentorial region. Question watershed type infarcts with embolic infarcts not excluded. 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 Neurology was consulted advised to continue aspirin and Plavix as prior to admission which had initially been held for noted back surgery. Carotid Doppler studies have been ordered with results pending. Patient with chronic renal insufficiency creatinine 1.5-1.8 and monitored with latest creatinine 1.44 on 06/30/2011. Therapies have been initiated she is wearing a  back corset when out of bed.  Patient transferred to CIR on 07/02/2011 .    Patient currently requires total with basic self-care skills and and basic mobility (bed mobility, sitting balance, sit to stand and transfers) Max A for cogntion secondary to muscle weakness and in all extremtries and core, decreased  cardiorespiratoy endurance, impaired timing and sequencing and unbalanced muscle activation, decreased midline orientation, decreased initiation, decreased attention, decreased awareness, decreased problem solving, decreased safety awareness, decreased memory, delayed processing and not oriented  and decreased sitting balance, decreased standing balance, decreased postural control, decreased balance strategies and difficulty maintaining precautions.  Prior to hospitalization, patient could complete ADLs with independence.  Patient will benefit from skilled intervention to decrease level of assist with basic self-care skills and increase independence with basic self-care skills prior to discharge home with care partner.  Anticipate patient will require 24 hour supervision and minimal physical assistance and follow up home health.  OT - End of Session Activity Tolerance: Tolerates 10 - 20 min activity with multiple rests Endurance Deficit: Yes OT Assessment Rehab Potential: Good OT Plan OT Frequency: 1-2 X/day, 60-90 minutes Estimated Length of Stay: 3-4weeks OT Treatment/Interventions: Balance/vestibular training;Community reintegration;Functional electrical stimulation;Disease mangement/prevention;Neuromuscular re-education;Psychosocial support;Skin care/wound managment;Therapeutic Exercise;UE/LE Coordination activities;UE/LE Strength taining/ROM;Self Care/advanced ADL retraining;Patient/family education;Pain management;Therapeutic Activities;Functional mobility training;DME/adaptive equipment instruction;Discharge planning;Cognitive remediation/compensation OT Recommendation Follow Up Recommendations: Home health OT Equipment Recommended: 3 in 1 bedside comode  OT Evaluation Precautions/Restrictions  Precautions Precautions: Back;Fall Precaution Comments: required total A for recall of 3/3 precautions Required Braces or Orthoses: Spinal Brace Spinal Brace: Lumbar corset;Applied in supine  position Restrictions Weight Bearing Restrictions: No General Chart Reviewed: Yes Family/Caregiver Present: No Vital Signs Therapy Vitals Pulse Rate: 63  (with activity) Pain Pain Assessment Pain Assessment: No/denies pain Pain Score: 0-No pain Faces Pain Scale: No hurt Pain Type: Acute pain Pain Location: Back Home Living/Prior Functioning Home Living Lives With: Spouse Available Help at Discharge: Family Type of Home: House Home Access: Stairs to enter Home Layout: One level Bathroom Shower/Tub: Health visitor: Standard ADL ADL Eating: Moderate assistance Grooming: Maximal assistance Upper Body Bathing: Dependent Lower Body Bathing: Dependent Upper Body Dressing: Dependent Lower Body Dressing: Dependent Toileting: Dependent (+2) Toilet Transfer: Dependent Toilet Transfer Method: Squat pivot;Stand pivot Acupuncturist: Grab bars Vision/Perception  Vision - History Baseline Vision: Wears contacts Perception Perception: Impaired Praxis Praxis: Impaired Praxis Impairment Details: Initiation;Motor planning  Cognition Overall Cognitive Status: Impaired Arousal/Alertness: Awake/alert Orientation Level: Oriented to person;Disoriented to place;Disoriented to situation;Disoriented to time Attention: Focused;Sustained;Selective Focused Attention: Appears intact Sustained Attention: Impaired Sustained Attention Impairment: Verbal basic;Functional basic Selective Attention: Impaired Selective Attention Impairment: Verbal basic;Functional basic Memory: Impaired Memory Impairment: Storage deficit;Retrieval deficit;Decreased recall of new information;Decreased short term memory;Decreased long term memory Decreased Long Term Memory: Verbal basic;Functional basic Decreased Short Term Memory: Verbal basic;Functional basic Awareness: Impaired Awareness Impairment: Intellectual impairment Problem Solving: Impaired Problem Solving Impairment:  Verbal basic;Functional basic Executive Function: Reasoning;Sequencing;Organizing;Decision Making;Initiating;Self Correcting Reasoning: Impaired Reasoning Impairment: Verbal basic;Functional basic Sequencing: Impaired Sequencing Impairment: Verbal basic;Functional basic Organizing: Impaired Organizing Impairment: Verbal basic;Functional basic Decision Making: Impaired Decision Making Impairment: Verbal basic;Functional basic Initiating: Impaired Initiating Impairment: Verbal basic;Functional basic Self Correcting: Impaired Self Correcting Impairment: Verbal basic;Functional basic Behaviors: Perseveration Safety/Judgment: Impaired Sensation  will continue to assess Motor  Motor Motor: Abnormal postural alignment and control;Motor impersistence Motor - Skilled Clinical Observations: weakness in all 4 extremetries Mobility  Bed Mobility Bed Mobility: Rolling Right;Sit to Supine Rolling Right: 2: Max assist  Rolling Right: Patient Percentage: 20% Right Sidelying to Sit: 1: +1 Total assist Transfers Sit to Stand: 1: +1 Total assist Stand to Sit: 1: +1 Total assist  Trunk/Postural Assessment  Postural Control Postural Control: Deficits on evaluation Trunk Control: poor sitting balance, even with bilateral UE support requiring max to total A Righting Reactions: Max difficulty to self correct balance in sititng  Balance Balance Balance Assessed: Yes Static Sitting Balance Static Sitting - Level of Assistance: 2: Max assist Static Sitting - Comment/# of Minutes: posterior lean Static Standing Balance Static Standing - Balance Support: During functional activity Static Standing - Level of Assistance: 1: +2 Total assist Static Standing - Comment/# of Minutes: attempted with toileting, unable to come up fully erect Extremity/Trunk Assessment RUE Assessment RUE Assessment: Exceptions to [facility] RUE AROM (degrees) Overall AROM Right Upper Extremity: Deficits (overall 3-/5) LUE  Assessment LUE Assessment: Exceptions to [facility] LUE Strength LUE Overall Strength: Deficits (overall 3-/5)  See FIM for current functional status Refer to Care Plan for Long Term Goals  Recommendations for other services: None  Discharge Criteria: Patient will be discharged from OT if patient refuses treatment 3 consecutive times without medical reason, if treatment goals not met, if there is a change in medical status, if patient makes no progress towards goals or if patient is discharged from hospital.  The above assessment, treatment plan, treatment alternatives and goals were discussed and mutually agreed upon: by patient  Treatment: 1:1 OT eval initiated with OT role, purpose and goals reviewed (however no family present at this time- pt unable to hold on to this information at this time). Pt with decreased orientation (unaware of surgeries or where she is- just knew she was getting rehab). Self care retraining: including bed mobility with rolling and sitting EOB with total A, total A for transfer from bed <w/c<>toilet, unable to fully come into a fully erect stance (decreased strength in core and bilateral LEs. Stood in [employer] but still unable to come into full stance for even 2 min. Total A for dressing with max for trunk/ core strength/mobility. Decreased attention to task to carry out task and decreased strength and endurance to perform UB or grooming tasks. Limited activity tolerance requiring frequent breaks  Roney Mans [facility] 07/03/2011, 10:59 AM

## 2011-07-03 NOTE — Evaluation (Signed)
Physical Therapy Assessment and Plan  Patient Details  Name: Jean [name] MRN: [id number] Date of Birth: Sep 28, 1928  PT Diagnosis: Abnormal posture, Cognitive deficits, Difficulty walking, Impaired cognition, Low back pain and Muscle weakness Rehab Potential: Fair ELOS: 3-4 weeks   Today's Date: 07/03/2011 Time: 0915-1010 Time Calculation (min): 55 min  Problem List:  Patient Active Problem List  Diagnoses  . HYPOTHYROIDISM, UNSPECIFIED  . VITAMIN D DEFICIENCY  . HYPERLIPIDEMIA  . HYPERTENSION  . CORONARY ARTERY DISEASE  . CORONARY ATHEROSCLEROSIS NATIVE CORONARY ARTERY  . GASTROENTERITIS  . RENAL INSUFFICIENCY  . INGROWN TOENAIL  . OSTEOARTHRITIS  . CRAMPS,LEG  . SYNCOPE  . COUGH  . FIBROCYSTIC BREAST DISEASE  . WRIST PAIN  . ANKLE PAIN  . UNSPECIFIED CLOSED FRACTURE OF ANKLE  . ANKLE SPRAIN, RIGHT  . Hip pain  . LBP (low back pain)  . Status post lumbar surgery  . Aortic injury, S/P vascular surgery  . ARF (acute renal failure)  . Delirium, acute  . Pulmonary edema  . Hypokalemia  . Lumbar radiculopathy    Past Medical History:  Past Medical History  Diagnosis Date  . Hyperlipidemia   . Thyroid disease     hypo  . CHF (congestive heart failure)   . Mitral regurgitation   . Tricuspid regurgitation   . Anemia   . UTI (lower urinary tract infection)   . Arthritis     Osteoarthtistis  . Elevated glucose   . CVA (cerebral infarction)   . Vitamin d deficiency   . Hypothyroidism   . Myocardial infarction   . Chronic kidney failure     stage 3  . CAD (coronary artery disease)     sees Dr. Ricka Burdock last 03/2011  . Hypertension     Dr. Eden Emms cardiologist, Dr. Cheron Schaumann primary doctor   Past Surgical History:  Past Surgical History  Procedure Date  . Abdominal hysterectomy   . Back surgery   . Coronary stent placement   . Laparotomy 06/23/2011    Procedure: EXPLORATORY LAPAROTOMY;  Surgeon: Barnett Abu, MD;  Location: [facility];  Service:  Neurosurgery;  Laterality: Bilateral;    Assessment & Plan Clinical Impression: Patient is a 78 y.o. year old female with recent admission to the hospital on 06/23/2011 with low back pain radiating to the left lower extremity. X-ray and imaging revealed lumbar L2-3 stenosis, kyphoscoliosis and radiculopathy. Underwent bilateral laminotomies and foraminotomies decompression lumbar L2-3 with decompression of L2 and L3 nerve roots as well as removal of previously placed hardware lumbar L3-5, pedicle screw fixation lumbar 2-3 per Dr. Danielle Dess 06/23/2011. Patient developed acute arterial bleeding during the case with vascular surgery consulted to Dr. Imogene Burn and underwent repair of avulsed lumbar artery. On 4/24/ 13 noted right-sided neglect. MRI of the brain showed multiple acute-subacute nonhemorrhagic small infarcts throughout the supratentorial and infratentorial region. Question watershed type infarcts with embolic infarcts not excluded. 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 Patient with chronic renal insufficiency.  Patient transferred to CIR on 07/02/2011 .   Patient currently requires total with mobility secondary to muscle weakness and decreased initiation, decreased attention, decreased awareness, decreased problem solving, decreased safety awareness, decreased memory and delayed processing.  She demonstrates minimal awareness of deficits with delayed processing, as well as generalized weakness secondary to surgery and hospital stay.  Prior to hospitalization, patient was indep with mobility  and lived with Spouse in a House home.  Home access is  Stairs to enter.  Patient will benefit from skilled PT intervention to maximize safe functional mobility, minimize fall risk and decrease caregiver burden for planned discharge home with 24 hour assist.  Anticipate  patient will benefit from follow up [facility] at discharge.  PT - End of Session Activity Tolerance: Tolerates 10 - 20 min activity with multiple rests Endurance Deficit: Yes Endurance Deficit Description: pt fatigued with 30 min activity with frequent rest breaks PT Assessment Rehab Potential: Fair Barriers to Discharge: Decreased caregiver support (husband is unable to provide much physical assistance) PT Plan PT Frequency: 1-2 X/day, 60-90 minutes Estimated Length of Stay: 3-4 weeks PT Treatment/Interventions: Ambulation/gait training;Balance/vestibular training;Cognitive remediation/compensation;Discharge planning;DME/adaptive equipment instruction;Functional mobility training;Neuromuscular re-education;Pain management;Patient/family education;Splinting/orthotics;Stair training;Therapeutic Activities;Therapeutic Exercise;UE/LE Strength taining/ROM;UE/LE Coordination activities;Wheelchair propulsion/positioning PT Recommendation Follow Up Recommendations: Home health PT;24 hour supervision/assistance Equipment Recommended: Wheelchair (measurements);Rolling walker with 5" wheels  PT Evaluation Precautions/Restrictions Precautions Precautions: Back;Fall Precaution Comments: required total A for recall of 3/3 precautions Required Braces or Orthoses: Spinal Brace Spinal Brace: Lumbar corset Restrictions Weight Bearing Restrictions: No Vital Signs Therapy Vitals Pulse Rate: 63  (with activity) Pain Pain Assessment Pain Assessment: No/denies pain Pain Score: 0-No pain Faces Pain Scale: No hurt Pain Type: Acute pain Pain Location: Back Home Living/Prior Functioning Home Living Lives With: Spouse Available Help at Discharge: Family Type of Home: House Home Access: Stairs to enter Home Layout: One level Bathroom Shower/Tub: Health visitor: Standard Prior Function Level of Independence: Independent with basic ADLs;Independent with homemaking with ambulation;Independent  with gait Driving: No  Cognition Overall Cognitive Status: Impaired Arousal/Alertness: Awake/alert Orientation Level: Oriented to person;Oriented to place;Oriented to situation;Disoriented to time Attention: Sustained;Focused;Selective Focused Attention: Appears intact Sustained Attention: Impaired Sustained Attention Impairment: Verbal basic;Functional basic Selective Attention: Impaired Selective Attention Impairment: Verbal basic;Functional basic Memory: Impaired Memory Impairment: Storage deficit;Retrieval deficit;Decreased recall of new information;Decreased short term memory;Decreased long term memory Decreased Long Term Memory: Verbal basic;Functional basic Decreased Short Term Memory: Verbal basic;Functional basic Awareness: Impaired Awareness Impairment:  (minimal intellectual awareness, pt stated "i have to really think about what i'm going to do now") Sequencing: Impaired Sequencing Impairment: Verbal basic;Functional basic Initiating: Impaired Initiating Impairment: Verbal basic;Functional basic Behaviors: Perseveration Safety/Judgment: Impaired Comments: pt is very pleasant and cooperative, but demonstrates very slow processing with above impairments Sensation Sensation Light Touch: Appears Intact Additional Comments: pt denied sensory changes but ? reliability Coordination Gross Motor Movements are Fluid and Coordinated: No Fine Motor Movements are Fluid and Coordinated: No Coordination and Movement Description: required hand over hand assist to place hands on w/c Motor  Motor Motor: Abnormal postural alignment and control Motor - Skilled Clinical Observations: weakness in all 4 extremetries  Mobility Bed Mobility Bed Mobility: Rolling Left;Supine to Sit;Sit to Supine Rolling Left: 2: Max assist Rolling Left Details (indicate cue type and reason): manual facilitation at hips and shoulders; pt able to use R UE to help pull Supine to Sit: 1: +1 Total  assist Sitting - Scoot to Edge of Bed: 3: Mod assist Sitting - Scoot to Edge of Bed Details (indicate cue type and reason): manual facilitation for forward trunk lean, at pelvis for weight shifting Sit to Supine: 1: +1 Total assist Sit to Supine - Details (indicate cue type and reason): pt unable to initiate leaning on elbow for log roll transfer Transfers Sit to Stand: 1: +2 Total assist;From elevated surface Sit to Stand Details (indicate cue type and  reason): +2 total A at edge of mat; pt able to achieve 70% upright posture, activate gluts/quads with verbal cuing but unable to tolerate standing >20 sec Stand to Sit: 1: +2 Total assist Stand Pivot Transfers: 1: +2 Total assist Lateral/Scoot Transfers: 1: +2 Total assist Lateral/Scoot Transfer Details (indicate cue type and reason): pt req +2 total A for scoot transfer w/c>mat, manual facilitation for forward lean and UE placement.  +1 total A for sliding board transfer  Locomotion  Ambulation Ambulation: No (pt unable to step with L foot)  Wheelchair propulsion: max A for hand over hand assist on L x10 feet Trunk/Postural Assessment  Cervical Assessment Cervical Assessment: Within Functional Limits Thoracic Assessment Thoracic Assessment: Exceptions to [facility] (limited by surgery/precautions) Lumbar Assessment Lumbar Assessment: Exceptions to [facility] (limited by surgery/precautions) Postural Control Postural Control: Deficits on evaluation Trunk Control: poor sitting balance, even with bilateral UE support requiring max to total A Righting Reactions: req max-total A to correct posterior lean in sitting  Balance Balance Balance Assessed: Yes Static Sitting Balance Static Sitting - Balance Support: Bilateral upper extremity supported;Feet supported Static Sitting - Level of Assistance: 2: Max assist Static Sitting - Comment/# of Minutes: posterior lean; pt able to maintain sitting posture with supervision for up to 1 min but req max A to  correct any posterior lean Static Standing Balance Static Standing - Balance Support: Bilateral upper extremity supported Static Standing - Level of Assistance: 1: +2 Total assist Static Standing - Comment/# of Minutes: pt unable to achieve fully upright posture, stood <20 sec Extremity Assessment  RUE Assessment RUE Assessment: Exceptions to [facility] RUE AROM (degrees) Overall AROM Right Upper Extremity: Deficits (overall 3-/5) LUE Assessment LUE Assessment: Exceptions to [facility] LUE Strength LUE Overall Strength: Deficits (overall 3-/5) RLE Assessment RLE Assessment: Exceptions to [facility] RLE Strength RLE Overall Strength: Deficits RLE Overall Strength Comments: overall 3-/5 LLE Assessment LLE Assessment: Exceptions to [facility] LLE Strength LLE Overall Strength Comments: grossly 3-/5  See FIM for current functional status Refer to Care Plan for Long Term Goals  Recommendations for other services: None  Discharge Criteria: Patient will be discharged from PT if patient refuses treatment 3 consecutive times without medical reason, if treatment goals not met, if there is a change in medical status, if patient makes no progress towards goals or if patient is discharged from hospital.  The above assessment, treatment plan, treatment alternatives and goals were discussed and mutually agreed upon: by patient and by family   Treatment provided:  Pt seated in w/c with posterior pelvic tilt and slouched posture, req max A to correct.  Performed scoot pivot transfer w/c>mat with +2 total A.  Pt demonstrates increased posterior trunk lean in sitting unsupported requiring max-total A for correction.  Sit<>stand transfers with B handheld +2 total A, pt unable to achieve fully upright posture and unable to tolerate >20 sec.  Bed mobility with +1 total A using log roll technique, pt able to initiate rolling to L but required max A to complete.  Attempted w/c propulsion with max A for hand over hand assist on L x10  feet.  Pt cooperative but with severely decreased attention, awareness, and delayed processing throughout.  Lockie Pares 07/03/2011, 11:27 AM

## 2011-07-03 NOTE — Discharge Summary (Signed)
Physician Discharge Summary  Patient ID: Jean [name] MRN: [id number] DOB/AGE: 76/31/1930 76 y.o.  Admit date: 07/02/2011 Discharge date: 07/03/2011  Admission Diagnoses: Kyphoscoliosis and stenosis L2-L3 status post arthrodesis L3-L5.   Discharge Diagnoses: Kyphoscoliosis and stenosis L2-L3 status post arthrodesis L3-L5. S/p Bilateral laminotomies and foraminotomies decompression L2-L3 with decompression of L2 and L3 nerve roots greater dissection then for simple interbody arthrodesis. Posterior interbody arthrodesis using peek spacers with autograft and allograft. Removal of previously placed hardware L3-L5, pedicle screw fixation L2-L3 with posterior lateral arthrodesis using allograft and autograft with complication of aortic injury requiring laparotomy and repair. Discharging to Inpatient Rehab.     Discharged Condition: fair  Hospital Course: Patient is an 78 year old individual that had developed increasing back and left lower extremity pain felt to be secondary to adjacent level disease at L2-L3 where she was developing an increasingly more severe stenosis. She was advised regarding the need for surgical decompression and arthrodesis and was admitted for bilateral laminotomies and foraminotomies, decompression lumbar L2-3 with decompression of L2 and L3 nerve roots as well as removal of previously placed hardware lumbar L3-5, pedicle screw fixation lumbar 2-3 per Dr. Danielle Dess 06/23/2011. Patient developed acute arterial bleeding during the case with vascular surgery consulted to Dr. Imogene Burn and underwent repair of avulsed lumbar artery. During the procedure she lost an estimated 1800 cc of blood and did receive 2 units of packed red blood cells. On 4/24/ 13 noted right-sided neglect. MRI of the brain showed multiple acute-subacute nonhemorrhagic small infarcts throughout the supratentorial and infratentorial region. Question watershed type infarcts with embolic infarcts not excluded. 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 Neurology was consulted advised to continue aspirin and Plavix as prior to admission which had initially been held for noted back surgery. Carotid Doppler studies have been ordered with results pending. Patient with chronic renal insufficiency creatinine 1.5-1.8 and monitored with latest creatinine 1.44 on 06/30/2011. Therapies have been initiated she is wearing a back corset when out of bed. Pt is transferred to Inpatient Rehab.    Consults: pulmonary/intensive care, neurology, rehabilitation medicine and vascular surgery  Significant Diagnostic Studies: radiology: X-Ray: Intra-operative and WUJ:[id number] acute/subacute non hemorrhagic small infarcts throughout the supratentorial and infratentorial region. Question watershed type infarcts with embolic infarcts not excluded. Prominent intracranial atherosclerotic type changes as detailed above.  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     Treatments: surgery: Bilateral laminotomies and foraminotomies decompression L2-L3 with decompression of L2 and L3 nerve roots greater dissection then for simple interbody arthrodesis. Posterior interbody arthrodesis using peek spacers with autograft and allograft. Removal of previously placed hardware L3-L5, pedicle screw fixation L2-L3 with posterior lateral arthrodesis using allograft and autograft. Exploratory celiotomy with repair of avulsed lumbar artery    Discharge Exam: Blood pressure 145/89, pulse 74, temperature 98 F (36.7 C), temperature source Oral, resp. rate 19, height 5\' 7"  (1.702 m), weight 76.2 kg (167 lb 15.9 oz), SpO2 99.00%. Patient reports feeling a bit better today Up in lift chair with PT help.  Alert, oriented and conversant.  No complaints Mobilizing  with PT.  Improving.    Disposition: Inpatient Rehab  Discharge Orders    Future Appointments: Provider: Department: Dept Phone: Center:   09/09/2011 9:15 AM Tresa Garter, MD [facility] [phone] [facility]     Medication List  As of  07/03/2011 10:17 AM   ASK your doctor about these medications         aspirin 325 MG tablet   Take 325 mg by mouth daily.      cholecalciferol 1000 UNITS tablet   Commonly known as: VITAMIN D   Take 1,000 Units by mouth daily.      clopidogrel 75 MG tablet   Commonly known as: PLAVIX   Take 75 mg by mouth daily.      CO Q 10 PO   Take 1 tablet by mouth daily.      levothyroxine 75 MCG tablet   Commonly known as: SYNTHROID, LEVOTHROID   Take 75 mcg by mouth daily.      meclizine 25 MG tablet   Commonly known as: ANTIVERT   Take 25 mg by mouth 3 (three) times daily as needed. dizziness      methyldopa 250 MG tablet   Commonly known as: ALDOMET   Take 250 mg by mouth 2 (two) times daily.      metoprolol 100 MG tablet   Commonly known as: LOPRESSOR   Take 150 mg by mouth 2 (two) times daily.      nitroGLYCERIN 0.4 MG SL tablet   Commonly known as: NITROSTAT   Place 0.4 mg under the tongue every 5 (five) minutes as needed. For chest pain      pravastatin 20 MG tablet   Commonly known as: PRAVACHOL   Take 20 mg by mouth 2 (two) times daily.             SignedGeorgiann Cocker LATE ENTRY 07/03/11 @ 1040 for Dr. Fredrich Birks visit 07/02/11.

## 2011-07-04 ENCOUNTER — Inpatient Hospital Stay (HOSPITAL_COMMUNITY): Payer: Medicare Other

## 2011-07-04 LAB — BASIC METABOLIC PANEL
CO2: 26 mEq/L (ref 19–32)
Calcium: 8.8 mg/dL (ref 8.4–10.5)
Chloride: 104 mEq/L (ref 96–112)
Glucose, Bld: 102 mg/dL — ABNORMAL HIGH (ref 70–99)
Sodium: 138 mEq/L (ref 135–145)

## 2011-07-04 NOTE — Progress Notes (Signed)
Subjective/Complaints: Seems to have had a good night. Alert this am and eating breakfast Ros performed and otherwise negative  Objective: Vital Signs: Blood pressure 156/73, pulse 91, temperature 97.9 F (36.6 C), temperature source Oral, resp. rate 18, height 5\' 7"  (1.702 m), weight 76.2 kg (167 lb 15.9 oz), SpO2 93.00%. No results found.  Basename 07/03/11 0622  WBC 10.1  HGB 10.5*  HCT 31.4*  PLT 203    Basename 07/04/11 0650 07/03/11 0622  NA 138 136  K 3.5 3.7  CL 104 103  CO2 26 22  GLUCOSE 102* 110*  BUN 24* 25*  CREATININE 1.54* 1.29*  CALCIUM 8.8 8.7   CBG (last 3)  No results found for this basename: GLUCAP:3 in the last 72 hours  Wt Readings from Last 3 Encounters:  07/02/11 76.2 kg (167 lb 15.9 oz)  07/02/11 77.8 kg (171 lb 8.3 oz)  07/02/11 77.8 kg (171 lb 8.3 oz)    Physical Exam:  General appearance: alert, cooperative, appears stated age and no distress Head: Normocephalic, without obvious abnormality, atraumatic Eyes: conjunctivae/corneas clear. PERRL, EOM's intact. Fundi benign. Ears: normal TM's and external ear canals both ears Nose: Nares normal. Septum midline. Mucosa normal. No drainage or sinus tenderness. Throat: lips, mucosa, and tongue normal; teeth and gums normal Neck: no adenopathy, no carotid bruit, no JVD, supple, symmetrical, trachea midline and thyroid not enlarged, symmetric, no tenderness/mass/nodules Back: tender superior to incision. posture poor.  Resp: clear to auscultation bilaterally Cardio: regular rate and rhythm, S1, S2 normal, no murmur, click, rub or gallop GI: soft, non-tender; bowel sounds normal; no masses,  no organomegaly Extremities: extremities normal, atraumatic, no cyanosis or edema Pulses: 1+ edema lue Skin: Skin color, texture, turgor normal. No rashes or lesions Neurologic: delayed processing but a little better. Can feed herself with extra time and some cues. Gross, generalized weakness--let more than  right (inconsistent) no focal CN findings. Limited insight and awareness. Better sitting posture today Incision/Wound: clean and intact   Assessment/Plan: 1. Functional deficits secondary to lumbar stenosis, radiculopathy, with post op watershed infarct which require 3+ hours per day of interdisciplinary therapy in a comprehensive inpatient rehab setting. Physiatrist is providing close team supervision and 24 hour management of active medical problems listed below. Physiatrist and rehab team continue to assess barriers to discharge/monitor patient progress toward functional and medical goals. FIM:    FIM - Upper Body Dressing/Undressing Upper body dressing/undressing steps patient completed: Thread/unthread left bra strap;Thread/unthread left sleeve of pullover shirt/dress Upper body dressing/undressing: 2: Max-Patient completed 25-49% of tasks FIM - Lower Body Dressing/Undressing Lower body dressing/undressing: 1: Total-Patient completed less than 25% of tasks (sit to stand with SARA)  FIM - Toileting Toileting: 1: Two helpers     FIM - Banker Devices: Sliding board Bed/Chair Transfer: 1: Supine > Sit: Total A (helper does all/Pt. < 25%);3: Bed > Chair or W/C: Mod A (lift or lower assist)  FIM - Locomotion: Wheelchair Locomotion: Wheelchair: 1: Travels less than 50 ft with maximal assistance (Pt: 25 - 49%) FIM - Locomotion: Ambulation Locomotion: Ambulation: 0: Activity did not occur  Comprehension Comprehension Mode: Auditory Comprehension: 2-Understands basic 25 - 49% of the time/requires cueing 51 - 75% of the time  Expression Expression Mode: Verbal Expression: 2-Expresses basic 25 - 49% of the time/requires cueing 50 - 75% of the time. Uses single words/gestures.  Social Interaction Social Interaction: 4-Interacts appropriately 75 - 89% of the time - Needs redirection for appropriate language  or to initiate interaction.  Problem  Solving Problem Solving: 2-Solves basic 25 - 49% of the time - needs direction more than half the time to initiate, plan or complete simple activities  Memory Memory: 2-Recognizes or recalls 25 - 49% of the time/requires cueing 51 - 75% of the time  1. Lumbar stenosis with radiculopathy. Status post bilateral laminotomies with decompression and removal of previous hardware. Complicated by acute arterial bleeding with vascular repair 06/23/2011  2. 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 Continue aspirin Plavix. Carotid Dopplers are pending  3. DVT Prophylaxis/Anticoagulation: SCDs. Monitor for any signs of deep vein thrombosis  4. Hypertension. aldomet 250 mg twice a day, Lopressor 150 mg twice a day. Monitor with increased mobility  5. Hypothyroidism. Synthroid  6. Mood/postoperative confusion. Presently on low dose Risperdal. Will continue to monitor. Speech therapy to followup for cognitive eval after recent stroke. Need to restore sleep wake cycle. Be careful with pain medications 7. FEN: mild renal insufficiency  -push po fluids and po intake as a whole  -CR slightly increased, BUN slightly down.   -follow serial BMET's- hold on ivf at present     LOS (Days) 2 A FACE TO FACE EVALUATION WAS PERFORMED  Aerionna Moravek T 07/04/2011, 8:53 AM

## 2011-07-04 NOTE — Progress Notes (Signed)
Occupational Therapy Session Note  Patient Details  Name: Jean [name] MRN: [id number] Date of Birth: December 03, 1928  Today's Date: 07/04/2011 Time: 9629-5284 Time Calculation (min): 45 min  Short Term Goals: Week 1:  OT Short Term Goal 1 (Week 1): Pt will sit EOB for 3 min wtih min A in prep for ADL tasks OT Short Term Goal 2 (Week 1): Pt will demonstrate sustained attention with min A during funcitonal grooming tasks OT Short Term Goal 3 (Week 1): Pt will transfer with LRAD with max +1 for basic transfers OT Short Term Goal 4 (Week 1): Pt will don shirt with mod A in sitting   Skilled Therapeutic Interventions/Progress Updates:    1:1 self care retraining. Pt laying in bed incontinent of BM with brief on. Focused on rolling with max A to right and left with bed rail for hygiene and change brief and don back brace. Sidelying to sitting with total A +2 with 2nd person supporting trunk for sitting balance. Able to sit with bilateral UB support with mod A. Engaged in dressing only with max tactile and verbal cues for sequencing, initiating and completing tasks for UB (with extra time), sit to stand with [place] Dec for [employer]. Pt unable to maintain an upright standing position for long or correct her posterior pelvic tilt (requiring total A). Slide board transfer bed to w/c with mod A with second person holding chair. Pt able to demonstrate increased weight shifting in order to slide along the board. Grooming at time with max instructional and demonstrative cues.  Therapy Documentation Precautions:  Precautions Precautions: Back;Fall Precaution Comments: required total A for recall of 3/3 precautions Required Braces or Orthoses: Spinal Brace Spinal Brace: Lumbar corset Restrictions Weight Bearing Restrictions: No Pain:  just c/o back soreness  See FIM for current functional status  Therapy/Group: Individual Therapy  Roney Mans [facility] 07/04/2011, 8:27 AM

## 2011-07-04 NOTE — Care Management Note (Addendum)
Inpatient Rehabilitation Center Individual Statement of Services  Patient Name:  ANUSHRI CASALINO  Date:  07/04/2011  Welcome to the Inpatient Rehabilitation Center.  Our goal is to provide you with an individualized program based on your diagnosis and situation, designed to meet your specific needs.  With this comprehensive rehabilitation program, you will be expected to participate in at least 3 hours of rehabilitation therapies Monday-Friday, with modified therapy programming on the weekends.  Your rehabilitation program will include the following services:  Physical Therapy (PT), Occupational Therapy (OT), Speech Therapy (ST), 24 hour per day rehabilitation nursing, Therapeutic Recreaction (TR), Case Management (RN and Child psychotherapist), Rehabilitation Medicine, Nutrition Services and Pharmacy Services  Weekly team conferences will be held on  Tuesday  to discuss your progress.  Your RN Case Designer, television/film set will talk with you frequently to get your input and to update you on team discussions.  Team conferences with you and your family in attendance may also be held.  Expected length of stay: 3-4 weeks  Overall anticipated outcome: Min-Mod Assist  24/7  Depending on your progress and recovery, your program may change.  Your RN Case Estate agent will coordinate services and will keep you informed of any changes.  Your RN Sports coach and SW names and contact numbers are listed  below.  The following services may also be recommended but are not provided by the Inpatient Rehabilitation Center:   Driving Evaluations  Home Health Rehabiltiation Services  Outpatient Rehabilitatation [facility]  Vocational Rehabilitation   Arrangements will be made to provide these services after discharge if needed.  Arrangements include referral to agencies that provide these services.  Your insurance has been verified to be:  Medicare + [employer] Your primary doctor is:  Dr.  Posey Rea  Pertinent information will be shared with your doctor and your insurance company.  Case Manager: Melanee Spry, [facility] [phone]  Social Worker:  Amada [place], [place] [phone]  Information discussed with pt & husband & copy given to them by: Brock Ra, 07/04/2011, 11:47 AM

## 2011-07-04 NOTE — Progress Notes (Signed)
Occupational Therapy Note  Patient Details  Name: Jean [name] MRN: [id number] Date of Birth: 04-13-1928 Today's Date: 07/04/2011  Time: 1130-1200 Pt denies pain Group Therapy  Pt participated in self feeding group with focus on task initiation and sustained attention during self feeding.  Pt easily distracted by environmental distractions and requires verbal cues to reorient to task.  Pt exhibits slow and deliberate actions and often requires verbal cues to complete task initiated.   Lavone Neri [facility] 07/04/2011, 4:12 PM

## 2011-07-04 NOTE — Progress Notes (Signed)
Physical Therapy Note  Patient Details  Name: Jean [name] MRN: [id number] Date of Birth: [birth year] Today's Date: 07/04/2011  Time: 1015-1110 55 minutes  No c/o pain.  Sliding board transfer with +2 total assist w/c>mat.  Pt required mod A to achieve upright posture with max cuing to keep weight forward; pt able to maintain upright posture on mat with supervision.  Practiced standing with +2 total A from elevated surface and RW; pt unable to achieve fully upright position and required max cuing to look forward; manual facilitation for hip extension.  Pt able to take a few steps with +2 max assist, RW, assist for advancement of L LE, weight shifting at pelvis, and upright posture.  Kinetron seated in w/c, 3x1 min with max cuing for attention.  Partial sit-ups in w/c x5 with B UE assist to promote abdominal strength/improved sitting balance.  W/c propulsion x25 feet with max A, hand over hand assist for steering/UE placement on wheels.  Overall sustained attention <10 sec, pt does best in quiet, nondistracting environment.  Pt limited by decreased initiation and impaired muscle endurance.  Individual therapy  Lockie Pares 07/04/2011, 12:07 PM

## 2011-07-04 NOTE — Progress Notes (Signed)
Speech Language Pathology Daily Session Note  Patient Details  Name: Jean [name] MRN: [id number] Date of Birth: May 22, 1928  Today's Date: 07/04/2011 Time: 1200-1240 Time Calculation (min): 40 min  Skilled Therapeutic Interventions: [employer] treatment session co-treat with OT; SLP facilitated session with mod-max assist semantic cues to attend to self feeding throughout session; moderate assist semantic cues to attend to topic of conversation.  Patient consumed Dys.3 textures and nectar-thick liquids with minimal assist semantic cues alternate solids and liquids to assist with reducing oral residue; Overall, no overt s/s of thin liquids via straw.    Daily Session Pain Pain Assessment Pain Assessment: No/denies pain Pain Score: 0-No pain  Therapy/Group: Group Therapy  Charlane Ferretti., CCC-SLP [phone]  [name] 07/04/2011, 2:37 PM

## 2011-07-04 NOTE — Progress Notes (Signed)
Social Work Assessment and Plan Social Work Assessment and Plan  Patient Details  Name: Jean [name] MRN: [id number] Date of Birth: 1928/08/31  Today's Date: 07/04/2011  Problem List:  Patient Active Problem List  Diagnoses  . HYPOTHYROIDISM, UNSPECIFIED  . VITAMIN D DEFICIENCY  . HYPERLIPIDEMIA  . HYPERTENSION  . CORONARY ARTERY DISEASE  . CORONARY ATHEROSCLEROSIS NATIVE CORONARY ARTERY  . GASTROENTERITIS  . RENAL INSUFFICIENCY  . INGROWN TOENAIL  . OSTEOARTHRITIS  . CRAMPS,LEG  . SYNCOPE  . COUGH  . FIBROCYSTIC BREAST DISEASE  . WRIST PAIN  . ANKLE PAIN  . UNSPECIFIED CLOSED FRACTURE OF ANKLE  . ANKLE SPRAIN, RIGHT  . Hip pain  . LBP (low back pain)  . Status post lumbar surgery  . Aortic injury, S/P vascular surgery  . ARF (acute renal failure)  . Delirium, acute  . Pulmonary edema  . Hypokalemia  . Lumbar radiculopathy   Past Medical History:  Past Medical History  Diagnosis Date  . Hyperlipidemia   . Thyroid disease     hypo  . CHF (congestive heart failure)   . Mitral regurgitation   . Tricuspid regurgitation   . Anemia   . UTI (lower urinary tract infection)   . Arthritis     Osteoarthtistis  . Elevated glucose   . CVA (cerebral infarction)   . Vitamin d deficiency   . Hypothyroidism   . Myocardial infarction   . Chronic kidney failure     stage 3  . CAD (coronary artery disease)     sees Dr. Ricka Burdock last 03/2011  . Hypertension     Dr. Eden Emms cardiologist, Dr. Cheron Schaumann primary doctor   Past Surgical History:  Past Surgical History  Procedure Date  . Abdominal hysterectomy   . Back surgery   . Coronary stent placement   . Laparotomy 06/23/2011    Procedure: EXPLORATORY LAPAROTOMY;  Surgeon: Barnett Abu, MD;  Location: [facility];  Service: Neurosurgery;  Laterality: Bilateral;   Social History:  reports that she has never smoked. She does not have any smokeless tobacco history on file. She reports that she does not drink alcohol or  use illicit drugs.  Family / Support Systems Marital Status: Married Patient Roles: Spouse;Parent (grand/ great grandparent) Spouse/Significant Other: husband, "C.K." Doetsch @ (H) [phone] or (C) [phone] Children: daughters, Marella Bile and Pincus Large [facility] @ [phone] or [phone].  Both are local and live close by. Andrey Campanile is retired. 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  Pt slightly tearful when asked about reason for hospitalization.  Appears fatigued.  Cannot adequately assess emotional status given cognitive impairments but will monitor through CIR stay. Recent Psychosocial Issues: None Pyschiatric History: None Substance Abuse History: None  Patient / Family Perceptions, Expectations & Goals Pt/Family understanding of illness &  functional limitations: Pt initially states  she does not know why she is in hospital - when pushed by husband she confirms, when prompted, that she had back surgery and a stroke.  Husband with a better overall understanding of medical issues  and of current functional limitations. Premorbid pt/family roles/activities: Pt was relatively independent PTA, "...but very slow and had alot of pain" per husband.  Was independent with self care. Husband was providing most home management with daughters assisting as needed Anticipated changes in roles/activities/participation: Husband will likely need to provide increased physical assistance and increased direction to patient dependent on cognitive improvement.  Daughters' involvement also likely to increase Pt/family expectations/goals: "I'd like her to be able to do a little more for herself..."  [employer]: None Premorbid Home Care/DME Agencies: None Transportation available at discharge: yes Resource referrals recommended: Support group (specify);Neuropsychology (possible neuropsych plus Stroke Support group)  Discharge Planning Living Arrangements: Spouse/significant other Support Systems: Spouse/significant other;Children Type of Residence: Private residence Insurance Resources: [employer] Financial Resources: Social Security Financial Screen Referred: No Living Expenses: Own Money Management: Spouse Do you have any problems obtaining your medications?: No Home Management: husband was primary caretaker of home Patient/Family Preliminary Plans: Pt plans to d/c home with husband as primary support and daughters providing for additional needs. Social Work Anticipated Follow Up Needs: HH/OP;Support Group Expected length of stay: 2-3 weeks  Clinical Impression Pleasant, soft-spoken woman with apparent cognitive deficits following CVA.  Elderly husband at bedside and providing most information on pt's behalf.  Husband appears very involved and supportive and reports that two daughters will assist him in meeting pt's care needs at d/c.  Cannot fully assess pt's emotional functioning at this time due to cognitive deficits but will monitor throughout CIR  stay.  Jean [name]  Jean [name] 07/04/2011, 3:05 PM

## 2011-07-04 NOTE — Progress Notes (Signed)
Physical Therapy Session Note  Patient Details  Name: HALCYON HECK MRN: [id number] Date of Birth: February 17, 1929  Today's Date: 07/04/2011 Time: 4098-1191 Time Calculation (min): 24 min  Short Term Goals: Week 1:  PT Short Term Goal 1 (Week 1): pt will demonstrate sitting balance with B UE support with supervision x2 min consistently PT Short Term Goal 2 (Week 1): pt will perform med mobility with +1 max A PT Short Term Goal 3 (Week 1): pt will tolerate standing x1 min with mod A PT Short Term Goal 4 (Week 1): pt will perform bed-chair transfers with +1 max A  Therapy Documentation Precautions:  Precautions Precautions: Back;Fall Precaution Comments: required total A for recall of 3/3 precautions Required Braces or Orthoses: Spinal Brace Spinal Brace: Lumbar corset Restrictions Weight Bearing Restrictions: No Pain: Pain Assessment Pain Assessment: No/denies pain Pain Score: 0-No pain Other Treatments: Treatments Therapeutic Activity: Patient very slumped, sacral sitting in w/c; assisted patient with anterior lean and scooting buttocks posterior in w/c with mod-max A.  At // bars performed 2 reps sit <> stand with +2A with max verbal and tactile cues for activation and maintenance of upright trunk posture and full hip and knee extension.  Fatigued very quickly and impaired attention to task.  Performed squat pivot w/c > bed and sit > supine with +2A with total verbal and tactile cues for anterior lean to shift COG over BOS.   See FIM for current functional status  Therapy/Group: Individual Therapy  Edman Circle [facility] 07/04/2011, 4:59 PM

## 2011-07-04 NOTE — Procedures (Signed)
Objective Swallowing Evaluation: Modified Barium Swallowing Study  Patient Details  Name: Jean [name] MRN: [id number] Date of Birth: 1928/07/31  Today's Date: 07/04/2011 Time: 0865-7846 Time Calculation (min): 45 min  Past Medical History:  Past Medical History  Diagnosis Date  . Hyperlipidemia   . Thyroid disease     hypo  . CHF (congestive heart failure)   . Mitral regurgitation   . Tricuspid regurgitation   . Anemia   . UTI (lower urinary tract infection)   . Arthritis     Osteoarthtistis  . Elevated glucose   . CVA (cerebral infarction)   . Vitamin d deficiency   . Hypothyroidism   . Myocardial infarction   . Chronic kidney failure     stage 3  . CAD (coronary artery disease)     sees Dr. Ricka Burdock last 03/2011  . Hypertension     Dr. Eden Emms cardiologist, Dr. Cheron Schaumann primary doctor   Past Surgical History:  Past Surgical History  Procedure Date  . Abdominal hysterectomy   . Back surgery   . Coronary stent placement   . Laparotomy 06/23/2011    Procedure: EXPLORATORY LAPAROTOMY;  Surgeon: Barnett Abu, MD;  Location: [facility];  Service: Neurosurgery;  Laterality: Bilateral;   HPI:  78 y.o. female admitted  06/23/2011 with low back pain, underwent bilateral laminotomies of L2 and L3. Patient developed acute arterial bleeding with vascular surgery and underwent repair of avulsed lumbar artery. On 06/25/11 noted right-sided neglect. MRI of the brain showed multiple acute-subacute nonhemorrhagic small infarcts throughout the supratentorial and infratentorial region. Question watershed type infarcts with embolic infarcts not excluded. Bedside swallow evaluation on 5/2 after transfer to CIR revealed overt signs of aspiration.  MBSS ordered to objectively evaluate swallow function.   Recommendation/Prognosis  Dysphagia Diagnosis: Mild pharyngeal phase dysphagia Clinical impression: Demonstrates a mild pharyngeal phase dysphagia marked by a mild swallow trigger delay  (1 second) with bolus entering the laryngeal vestibule prior to the swallow trigger (silent penetraion) and immediate silent aspiration once swallow triggered with thin liquid trials.  Mild generalized weakness of all swallow musculature also evident in addtion to the timing aspect.  Suspect this is an acute reversible dysphagia that will resolve as patient's overall conditioning improves.  Will repeat MBSS iln 5-7 days to reassess for an upgrade.  Swallow Evaluation Recommendations Diet Recommendations: Dysphagia 3 (Mechanical Soft);Nectar-thick liquid Liquid Administration via: Cup Medication Administration: Crushed with puree Supervision: Full supervision/cueing for compensatory strategies Compensations: Slow rate;Small sips/bites  SLP Goals  SLP Swallowing Goals Patient will consume recommended diet without observed clinical signs of aspiration with: Supervision/safety Patient will utilize recommended strategies during swallow to increase swallowing safety with: Supervision/safety   Myra Rude, M.S.,CCC-SLP Pager [phone] 07/04/2011, 10:46 AM

## 2011-07-05 DIAGNOSIS — Z5189 Encounter for other specified aftercare: Secondary | ICD-10-CM

## 2011-07-05 DIAGNOSIS — I6330 Cerebral infarction due to thrombosis of unspecified cerebral artery: Secondary | ICD-10-CM

## 2011-07-05 DIAGNOSIS — M4716 Other spondylosis with myelopathy, lumbar region: Secondary | ICD-10-CM

## 2011-07-05 LAB — BASIC METABOLIC PANEL
BUN: 24 mg/dL — ABNORMAL HIGH (ref 6–23)
CO2: 22 mEq/L (ref 19–32)
GFR calc non Af Amer: 33 mL/min — ABNORMAL LOW (ref >90)
Glucose, Bld: 109 mg/dL — ABNORMAL HIGH (ref 70–99)
Potassium: 3.6 mEq/L (ref 3.5–5.1)
Sodium: 138 mEq/L (ref 135–145)

## 2011-07-05 NOTE — Progress Notes (Signed)
Occupational Therapy Session Note  Patient Details  Name: Jean [name] MRN: [id number] Date of Birth: 17-Oct-1928  Today's Date: 07/05/2011 Time: 1205-1230 Time Calculation (min): 25 min  Skilled Therapeutic Interventions/Progress Updates: [employer].  Though patient stated she had just finished breakfast, she appropriately used both UEs and attended to the lunch meal.     Therapy Documentation Precautions:  Precautions Precautions: Back;Fall Precaution Comments: required total A for recall of 3/3 precautions Required Braces or Orthoses: Spinal Brace Spinal Brace: Lumbar corset Restrictions Weight Bearing Restrictions: No  Pain: no c/os   See FIM for current functional status  Therapy/Group: Group Therapy  Rozelle Logan 07/05/2011, 5:18 PM

## 2011-07-05 NOTE — Progress Notes (Signed)
Physical Therapy Session Note  Patient Details  Name: Jean [name] MRN: [id number] Date of Birth: Dec 14, 1928  Today's Date: 07/05/2011 Time: 4098-1191 Time Calculation (min): 45 min  Short Term Goals: Week 1:  PT Short Term Goal 1 (Week 1): pt will demonstrate sitting balance with B UE support with supervision x2 min consistently PT Short Term Goal 2 (Week 1): pt will perform med mobility with +1 max A PT Short Term Goal 3 (Week 1): pt will tolerate standing x1 min with mod A PT Short Term Goal 4 (Week 1): pt will perform bed-chair transfers with +1 max A  Skilled Therapeutic Interventions/Progress Updates: Therapy Documentation Precautions:  Precautions Precautions: Back;Fall Precaution Comments: required total A for recall of 3/3 precautions Required Braces or Orthoses: Spinal Brace Spinal Brace: Lumbar corset Restrictions Weight Bearing Restrictions: No Pain: 2/10 pain at abdomen, otherwise no c/o pain  Mobility: Therapeutic Activity (30') Bed Mobility Rolling Right with Mod-A, supine<->sit via Right Log Roll with Mod/Max-Assist                       Transfers sit<->stand Max-Assist from elevated bed position and Max verbal and tactile cues to stand upright with head up (was able to perform this well 1 out of 4x)      Sitting EOB initially with Mod-A and tendency to lean backwards toward bed                        Latera with sitting EOB with Supervised assist and verbal cues, patient kept hands on her knees and this allowed her to remain in static sitting position for 5 minutes Exercises: (15') Supine B LE's with tactile cues and verbal cues to remain on task                     Supine bridging with min-Assist (patient unable to elevate pelvis but was able to perform pelvic tilt and tense gluteals) Husband was present throughout treatment session   Therapy/Group: Individual Therapy  Teddy Rebstock J 07/05/2011, 2:46 PM

## 2011-07-05 NOTE — Progress Notes (Signed)
Speech Language Pathology Daily Session Note  Patient Details  Name: Jean [name] MRN: [id number] Date of Birth: Nov 17, 1928  Today's Date: 07/05/2011 Time: 1135-1205 Time Calculation (min): 30 min  Short Term Goals: Week 1:    Skilled Therapeutic Interventions: Patient seen to address dysphagia goals in group setting at lunch meal. Patient stated that she was not very hungry because she had "just eaten" breakfast. Patient required verbal cues to remind her of swallow strategies for safety, and setup of meal tray. No overt s/s aspiration noted.  Daily Session Precautions/Restrictions    FIM:  Comprehension Comprehension: 3-Understands basic 50 - 74% of the time/requires cueing 25 - 50%  of the time Expression Expression: 4-Expresses basic 75 - 89% of the time/requires cueing 10 - 24% of the time. Needs helper to occlude trach/needs to repeat words. Social Interaction Social Interaction: 4-Interacts appropriately 75 - 89% of the time - Needs redirection for appropriate language or to initiate interaction. Problem Solving Problem Solving: 2-Solves basic 25 - 49% of the time - needs direction more than half the time to initiate, plan or complete simple activities Memory Memory: 2-Recognizes or recalls 25 - 49% of the time/requires cueing 51 - 75% of the time FIM - Eating Eating Activity: 5: Set-up assist for open containers;5: Supervision/cues General    Pain Pain Assessment Pain Assessment: No/denies pain Cognition:   Oral/Motor: Oral Motor/Sensory Function Overall Oral Motor/Sensory Function: Appears within functional limits for tasks assessed Comprehension:   Expression:    Therapy/Group: Group Therapy and Dysphagia Group  Pablo Lawrence 07/05/2011, 5:29 PM  Angela Nevin, MA, CCC-SLP [facility] Speech-Language Pathologist

## 2011-07-05 NOTE — Progress Notes (Signed)
Speech Language Pathology Daily Session Note  Patient Details  Name: Jean [name] MRN: [id number] Date of Birth: [birth year]  Today's Date: 07/05/2011 Time: 0730-0800 Time Calculation (min): 30 min  Short Term Goals: Patient will consume recommended diet without observed clinical signs of aspiration with: Supervision/safety  Patient will utilize recommended strategies during swallow to increase swallowing safety with: Supervision/safety   Skilled Therapeutic Interventions: Treatment focused on functional initiation and safe PO intake during breakfast meal of Dys.3/ nectar-thick liquids with supervision level contextual cues.  Improved initiation during meal with only occasional prompts. Discussed with RN and NT; downgraded patient to intermittent supervision for meals.  FIM:  Comprehension Comprehension Mode: Auditory Comprehension: 3-Understands basic 50 - 74% of the time/requires cueing 25 - 50%  of the time Expression Expression Mode: Verbal Expression: 4-Expresses basic 75 - 89% of the time/requires cueing 10 - 24% of the time. Needs helper to occlude trach/needs to repeat words. Social Interaction Social Interaction: 4-Interacts appropriately 75 - 89% of the time - Needs redirection for appropriate language or to initiate interaction. Problem Solving Problem Solving: 2-Solves basic 25 - 49% of the time - needs direction more than half the time to initiate, plan or complete simple activities Memory Memory: 2-Recognizes or recalls 25 - 49% of the time/requires cueing 51 - 75% of the time FIM - Eating Eating Activity: 5: Set-up assist for cut food;5: Supervision/cues   Pain Pain Assessment Pain Assessment: No/denies pain  Therapy/Group: Individual Therapy  Myra Rude, M.S.,CCC-SLP Pager [phone] 07/05/2011, 8:17 AM

## 2011-07-05 NOTE — Progress Notes (Signed)
Patient ID: Jean [name], female   DOB: 07-22-1928, 78 y.o.   MRN: [id number] Subjective/Complaints: Seems to have had a good night. Alert this am and eating breakfast Ros performed and otherwise negative  Objective: Vital Signs: Blood pressure 158/78, pulse 78, temperature 98.4 F (36.9 C), temperature source Oral, resp. rate 18, height 5\' 7"  (1.702 m), weight 76.2 kg (167 lb 15.9 oz), SpO2 93.00%. Dg Swallowing Func-no Report  07/04/2011  CLINICAL DATA: dysphagia   FLUOROSCOPY FOR SWALLOWING FUNCTION STUDY:  Fluoroscopy was provided for swallowing function study, which was  administered by a speech pathologist.  Final results and recommendations  from this study are contained within the speech pathology report.      Basename 07/03/11 0622  WBC 10.1  HGB 10.5*  HCT 31.4*  PLT 203    Basename 07/05/11 0600 07/04/11 0650  NA 138 138  K 3.6 3.5  CL 105 104  CO2 22 26  GLUCOSE 109* 102*  BUN 24* 24*  CREATININE 1.43* 1.54*  CALCIUM 8.6 8.8   CBG (last 3)  No results found for this basename: GLUCAP:3 in the last 72 hours  Wt Readings from Last 3 Encounters:  07/02/11 76.2 kg (167 lb 15.9 oz)  07/02/11 77.8 kg (171 lb 8.3 oz)  07/02/11 77.8 kg (171 lb 8.3 oz)    Physical Exam:  General appearance: alert, cooperative, appears stated age and no distress Head: Normocephalic, without obvious abnormality, atraumatic Eyes: conjunctivae/corneas clear. PERRL, EOM's intact.  Ears: normal external ear canals both ears Nose: Nares normal. Septum midline. Mucosa normal. No drainage or sinus tenderness. Throat: lips, mucosa, and tongue normal; teeth and gums normal Neck: no adenopathy, no carotid bruit, no JVD, supple, symmetrical, trachea midline and thyroid not enlarged, symmetric, no tenderness/mass/nodules Back: tender superior to incision. posture poor.  Resp: clear to auscultation bilaterally Cardio: regular rate and rhythm, S1, S2 normal, no murmur, click, rub or gallop GI:  soft, non-tender; bowel sounds normal; no masses,  no organomegaly Extremities: extremities normal, atraumatic, no cyanosis or edema Pulses: 1+ edema lue Skin: Skin color, texture, turgor normal. No rashes or lesions Neurologic: delayed processing but a little better. Can feed herself with extra time and some cues. Gross, generalized weakness--let more than right (inconsistent) no focal CN findings. Limited insight and awareness. 4/5 Strength BUE and BLE Incision/Wound: clean and intact   Assessment/Plan: 1. Functional deficits secondary to lumbar stenosis, radiculopathy, with post op watershed infarct which require 3+ hours per day of interdisciplinary therapy in a comprehensive inpatient rehab setting. Physiatrist is providing close team supervision and 24 hour management of active medical problems listed below. Physiatrist and rehab team continue to assess barriers to discharge/monitor patient progress toward functional and medical goals. FIM:    FIM - Upper Body Dressing/Undressing Upper body dressing/undressing steps patient completed: Thread/unthread left bra strap;Thread/unthread left sleeve of pullover shirt/dress Upper body dressing/undressing: 2: Max-Patient completed 25-49% of tasks FIM - Lower Body Dressing/Undressing Lower body dressing/undressing: 1: Total-Patient completed less than 25% of tasks (sit to stand with SARA)  FIM - Toileting Toileting: 1: Two helpers     FIM - Banker Devices: Sliding board Bed/Chair Transfer: 1: Chair or W/C > Bed: Total A (helper does all/Pt. < 25%)  FIM - Locomotion: Wheelchair Locomotion: Wheelchair: 1: Travels less than 50 ft with maximal assistance (Pt: 25 - 49%) FIM - Locomotion: Ambulation Locomotion: Ambulation Assistive Devices: Walker - Rolling Ambulation/Gait Assistance: 1: +2 Total assist Locomotion: Ambulation: 1:  Two helpers  Comprehension Comprehension Mode:  Auditory Comprehension: 3-Understands basic 50 - 74% of the time/requires cueing 25 - 50%  of the time  Expression Expression Mode: Verbal Expression: 4-Expresses basic 75 - 89% of the time/requires cueing 10 - 24% of the time. Needs helper to occlude trach/needs to repeat words.  Social Interaction Social Interaction: 4-Interacts appropriately 75 - 89% of the time - Needs redirection for appropriate language or to initiate interaction.  Problem Solving Problem Solving: 2-Solves basic 25 - 49% of the time - needs direction more than half the time to initiate, plan or complete simple activities  Memory Memory: 2-Recognizes or recalls 25 - 49% of the time/requires cueing 51 - 75% of the time  1. Lumbar stenosis with radiculopathy. Status post bilateral laminotomies with decompression and removal of previous hardware. Complicated by acute arterial bleeding with vascular repair 06/23/2011  2. 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 Continue aspirin Plavix. Carotid Dopplers are pending  3. DVT Prophylaxis/Anticoagulation: SCDs. Monitor for any signs of deep vein thrombosis  4. Hypertension. aldomet 250 mg twice a day, Lopressor 150 mg twice a day. Monitor with increased mobility  5. Hypothyroidism. Synthroid  6. Mood/postoperative confusion. Presently on low dose Risperdal. Will continue to monitor. Speech therapy to followup for cognitive eval after recent stroke. Need to restore sleep wake cycle. Be careful with pain medications 7. FEN: mild renal insufficiency  -push po fluids and po intake as a whole  -CR slightly increased, BUN slightly down.   -follow serial BMET's- hold on ivf at present     LOS (Days) 3 A FACE TO FACE EVALUATION WAS PERFORMED  Maciel Kegg E 07/05/2011, 10:10 AM

## 2011-07-06 ENCOUNTER — Inpatient Hospital Stay (HOSPITAL_COMMUNITY): Payer: Medicare Other

## 2011-07-06 LAB — DIFFERENTIAL
Basophils Relative: 0 % (ref 0–1)
Eosinophils Absolute: 0.1 10*3/uL (ref 0.0–0.7)
Eosinophils Relative: 1 % (ref 0–5)
Lymphs Abs: 0.8 10*3/uL (ref 0.7–4.0)
Monocytes Absolute: 1.1 10*3/uL — ABNORMAL HIGH (ref 0.1–1.0)
Monocytes Relative: 10 % (ref 3–12)
Neutrophils Relative %: 82 % — ABNORMAL HIGH (ref 43–77)

## 2011-07-06 LAB — CBC
HCT: 34.5 % — ABNORMAL LOW (ref 36.0–46.0)
Hemoglobin: 11.4 g/dL — ABNORMAL LOW (ref 12.0–15.0)
MCH: 28.7 pg (ref 26.0–34.0)
MCHC: 33 g/dL (ref 30.0–36.0)
MCV: 86.9 fL (ref 78.0–100.0)
RBC: 3.97 MIL/uL (ref 3.87–5.11)

## 2011-07-06 LAB — URINALYSIS, ROUTINE W REFLEX MICROSCOPIC
Glucose, UA: NEGATIVE mg/dL
Hgb urine dipstick: NEGATIVE
Ketones, ur: NEGATIVE mg/dL
Protein, ur: NEGATIVE mg/dL
pH: 5.5 (ref 5.0–8.0)

## 2011-07-06 MED ORDER — FUROSEMIDE 40 MG PO TABS
40.0000 mg | ORAL_TABLET | Freq: Two times a day (BID) | ORAL | Status: DC
  Administered 2011-07-06 – 2011-07-08 (×5): 40 mg via ORAL
  Filled 2011-07-06 (×8): qty 1

## 2011-07-06 NOTE — Progress Notes (Signed)
O2 sat 83% on RA. Patient with audible expiratory wheeze and non-productive cough. Mild SOB. Reports feeling, "fine", "I think I've caught a cold". O2 applied at 2L/M via [place]. Paged Dr.Kirsteins and order received for CxR.Tawanna Solo

## 2011-07-06 NOTE — Progress Notes (Signed)
Physical Therapy Note  Patient Details  Name: Jean [name] MRN: [id number] Date of Birth: 10/09/1928 Today's Date: 07/06/2011  Time: 1300-1330 30 minutes  No signs/symptoms of pain.  Treatment focused on seated balance and initiation/termination of tasks.  Pt max-total A for sitting balance this afternoon, increased fatigue noted.  Pt required increased time (90-120seconds) to initiate task with max verbal and visual cuing.  Pt able to accurately follow 2 step directions once able to initiate.  Pt limited by decreased activity tolerance and decreased attention.  Individual therapy   Kuzey Ogata 07/06/2011, 4:49 PM

## 2011-07-06 NOTE — Progress Notes (Signed)
Patient ID: Jean [name], female   DOB: 1929/01/22, 78 y.o.   MRN: [id number] Subjective/Complaints: SOB and low o2 sat last noc .  Sats improved on O2 .  No fever no CP Ros performed and otherwise negative  Objective: Vital Signs: Blood pressure 185/93, pulse 93, temperature 98.9 F (37.2 C), temperature source Oral, resp. rate 24, height 5\' 7"  (1.702 m), weight 76.2 kg (167 lb 15.9 oz), SpO2 88.00%. Dg Chest Port 1 View  07/06/2011  *RADIOLOGY REPORT*  Clinical Data: Shortness of breath; decreased O2 saturation. Wheezing and congestion.  PORTABLE CHEST - 1 VIEW  Comparison: Chest radiograph performed 06/28/2011  Findings: The lungs are well-aerated.  Vascular congestion is noted, with increased interstitial markings, raising concern for pulmonary edema.  Superimposed pneumonia cannot be excluded.  A small left pleural effusion is likely present.  No pneumothorax is seen.  The cardiomediastinal silhouette is borderline enlarged.  No acute osseous abnormalities are seen.  IMPRESSION: Vascular congestion and borderline cardiomegaly, with increased interstitial markings, raising concern for pulmonary edema; superimposed pneumonia cannot be excluded.  Likely small left pleural effusion.  Original Report Authenticated By: Tonia Ghent, M.D.   Dg Swallowing Func-no Report  07/04/2011  CLINICAL DATA: dysphagia   FLUOROSCOPY FOR SWALLOWING FUNCTION STUDY:  Fluoroscopy was provided for swallowing function study, which was  administered by a speech pathologist.  Final results and recommendations  from this study are contained within the speech pathology report.     No results found for this basename: WBC:2,HGB:2,HCT:2,PLT:2 in the last 72 hours  Basename 07/05/11 0600 07/04/11 0650  NA 138 138  K 3.6 3.5  CL 105 104  CO2 22 26  GLUCOSE 109* 102*  BUN 24* 24*  CREATININE 1.43* 1.54*  CALCIUM 8.6 8.8   CBG (last 3)  No results found for this basename: GLUCAP:3 in the last 72 hours  Wt Readings from  Last 3 Encounters:  07/02/11 76.2 kg (167 lb 15.9 oz)  07/02/11 77.8 kg (171 lb 8.3 oz)  07/02/11 77.8 kg (171 lb 8.3 oz)    Physical Exam:  General appearance: alert, cooperative, appears stated age and no distress Head: Normocephalic, without obvious abnormality, atraumatic Eyes: conjunctivae/corneas clear. PERRL, EOM's intact.  Ears: normal external ear canals both ears Nose: Nares normal. Septum midline. Mucosa normal. No drainage or sinus tenderness. Throat: lips, mucosa, and tongue normal; teeth and gums normal Neck: no adenopathy, no carotid bruit, no JVD, supple, symmetrical, trachea midline and thyroid not enlarged, symmetric, no tenderness/mass/nodules Back: tender superior to incision. posture poor.  Resp: clear to auscultation bilaterally Cardio: regular rate and rhythm, S1, S2 normal, no murmur, click, rub or gallop GI: soft, non-tender; bowel sounds normal; no masses,  no organomegaly Extremities: extremities normal, atraumatic, no cyanosis or edema Pulses: 1+ edema lue Skin: Skin color, texture, turgor normal. No rashes or lesions Neurologic: delayed processing but a little better. Can feed herself with extra time and some cues. Gross, generalized weakness--let more than right (inconsistent) no focal CN findings. Limited insight and awareness. 4/5 Strength BUE and BLE Incision/Wound: clean and intact   Assessment/Plan: 1. Functional deficits secondary to lumbar stenosis, radiculopathy, with post op watershed infarct which require 3+ hours per day of interdisciplinary therapy in a comprehensive inpatient rehab setting. Physiatrist is providing close team supervision and 24 hour management of active medical problems listed below. Physiatrist and rehab team continue to assess barriers to discharge/monitor patient progress toward functional and medical goals. FIM:    FIM -  Upper Body Dressing/Undressing Upper body dressing/undressing steps patient completed:  Thread/unthread left bra strap;Thread/unthread left sleeve of pullover shirt/dress Upper body dressing/undressing: 2: Max-Patient completed 25-49% of tasks FIM - Lower Body Dressing/Undressing Lower body dressing/undressing: 1: Total-Patient completed less than 25% of tasks (sit to stand with SARA)  FIM - Toileting Toileting: 1: Two helpers     FIM - Banker Devices: Sliding board Bed/Chair Transfer: 1: Chair or W/C > Bed: Total A (helper does all/Pt. < 25%)  FIM - Locomotion: Wheelchair Locomotion: Wheelchair: 1: Travels less than 50 ft with maximal assistance (Pt: 25 - 49%) FIM - Locomotion: Ambulation Locomotion: Ambulation Assistive Devices: Designer, industrial/product Ambulation/Gait Assistance: 1: +2 Total assist Locomotion: Ambulation: 1: Two helpers  Comprehension Comprehension Mode: Auditory Comprehension: 3-Understands basic 50 - 74% of the time/requires cueing 25 - 50%  of the time  Expression Expression Mode: Verbal Expression: 4-Expresses basic 75 - 89% of the time/requires cueing 10 - 24% of the time. Needs helper to occlude trach/needs to repeat words.  Social Interaction Social Interaction: 4-Interacts appropriately 75 - 89% of the time - Needs redirection for appropriate language or to initiate interaction.  Problem Solving Problem Solving: 2-Solves basic 25 - 49% of the time - needs direction more than half the time to initiate, plan or complete simple activities  Memory Memory: 2-Recognizes or recalls 25 - 49% of the time/requires cueing 51 - 75% of the time  1. Lumbar stenosis with radiculopathy. Status post bilateral laminotomies with decompression and removal of previous hardware. Complicated by acute arterial bleeding with vascular repair 06/23/2011  2. 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 Continue aspirin Plavix. Carotid Dopplers are pending  3. DVT Prophylaxis/Anticoagulation: SCDs.  Monitor for any signs of deep vein thrombosis  4. Hypertension. aldomet 250 mg twice a day, Lopressor 150 mg twice a day. Monitor with increased mobility  5. Hypothyroidism. Synthroid  6. Mood/postoperative confusion. Presently on low dose Risperdal. Will continue to monitor. Speech therapy to followup for cognitive eval after recent stroke. Need to restore sleep wake cycle. Be careful with pain medications 7. FEN: mild renal insufficiency  -push po fluids and po intake as a whole  -CR slightly increased, BUN slightly down.   -follow serial BMET's- hold on ivf at present 8.  CHF exacerbation EF is 40-45%- Add lasix, exam with reduced BS in R base.  Check CBC diff as well as BNP.  Repeat CXR in am    LOS (Days) 4 A FACE TO FACE EVALUATION WAS PERFORMED  Dorrine Montone E 07/06/2011, 7:47 AM

## 2011-07-06 NOTE — Progress Notes (Signed)
Physical Therapy Note  Patient Details  Name: MARYFER TAUZIN MRN: [id number] Date of Birth: [birth year] Today's Date: 07/06/2011  Time: 755-850 55 minutes  No signs or symptoms of pain.  Pt treatment focused on initiation and participation in functional mobility.  Bed mobility rolling, supine to sit with max A.  Pt able to initiate log roll with cuing, able to grab rail but unable to assist with moving LEs or trunk.  Total A for hygiene after incontinent episode.  Sitting edge of bed with min-mod A.  Pt max A to find center, balance point for sitting edge of bed with feet supported but is able to hold position with min A.  Pt total A for sitting balance when engaging UEs in functional task.  Sliding board transfers bed to w/c with total A. Pt unable to maintain fwd trunk flexion, delayed problem solving and motor planning making transfers difficult.  Nsg advised to use mechanical lift at this point.  Attempt to have pt sustain attention to eating breakfast, pt total A for attention to self feeding.  W/c mobility with B UEs with mod A 20'x2 with max cues for attention to task, hand over hand on L UE for propulsion.  Pt on 2L O2 [place] per RN, no audible wheezing during PT session.  Individual therapy   Shields Pautz 07/06/2011, 8:52 AM

## 2011-07-06 NOTE — Progress Notes (Signed)
Occupational Therapy Session Note  Patient Details  Name: Jean [name] MRN: [id number] Date of Birth: 1928-07-07  Today's Date: 07/06/2011 Time: 0230-0315 and 9:15-1000 Time Calculation (min): 45 min and 45 minutes=40minutes  Skilled Therapeutic Interventions/Progress Updates: patient scheduled for Bathing and Dressing but not sure she is appropriate to complete either task completely at this time due to low cognitive functioning, c/o fatigue, very delayed process and extra time required to initiate any tasks today.  AM attempted to don shirt and required constant tactile and physical cueing and was not able to complete the task when broken down into one step physical and verbal cueing;  Though two staff members transferred [name] with Total A x2 stand pivot, they recommended mechanical lift due to decreased strength and cognitive status of the patient for the safety of staff at this time.  PM  Patient groggy and sleeping through therapy; however, she did c/o pain "allover" and that the pain made her tired.   RN gave pain meds;  Patient assisted back to bed for this therapy session.  Husband present for am and pm treatments    Therapy Documentation Precautions:  Precautions Precautions: Back;Fall Precaution Comments: required total A for recall of 3/3 precautions Required Braces or Orthoses: Spinal Brace Spinal Brace: Lumbar corset Restrictions Weight Bearing Restrictions: No   Pain:   "all over" not able to rate but patient stated the pain fatigued her out;  RN gave pain meds   Pain Assessment Pain Assessment: 0-10 Pain Score:   5 Pain Location:  ("all over" - pt is rubbing abdomen) Pain Intervention(s): Medication (See eMAR)  ADL Eating: Moderate assistance Grooming: Maximal assistance Upper Body Bathing: Dependent Lower Body Bathing: Dependent Upper Body Dressing: Dependent Lower Body Dressing: Dependent Toileting: Dependent (+2) Toilet Transfer: Dependent Toilet Transfer  Method: Squat pivot;Stand pivot Toilet Transfer Equipment: Grab bars  See FIM for current functional status  Therapy/Group: Individual Therapy  Bud Face [facility] 07/06/2011, 4:08 PM

## 2011-07-07 ENCOUNTER — Inpatient Hospital Stay (HOSPITAL_COMMUNITY): Payer: Medicare Other

## 2011-07-07 DIAGNOSIS — I6330 Cerebral infarction due to thrombosis of unspecified cerebral artery: Secondary | ICD-10-CM

## 2011-07-07 DIAGNOSIS — M4716 Other spondylosis with myelopathy, lumbar region: Secondary | ICD-10-CM

## 2011-07-07 DIAGNOSIS — Z5189 Encounter for other specified aftercare: Secondary | ICD-10-CM

## 2011-07-07 MED ORDER — ENSURE PUDDING PO PUDG
1.0000 | Freq: Three times a day (TID) | ORAL | Status: DC
  Administered 2011-07-07 – 2011-07-23 (×37): 1 via ORAL

## 2011-07-07 MED FILL — Perflutren Lipid Microsphere IV Susp 6.52 MG/ML: INTRAVENOUS | Qty: 2 | Status: AC

## 2011-07-07 NOTE — Evaluation (Signed)
Speech Language Pathology Assessment and Plan  Patient Details  Name: Jean [name] MRN: [id number] Date of Birth: 11/16/1928  SLP Diagnosis: cognitive impairments  Rehab Potential: Good ELOS: 3 weeks   Today's Date: 07/07/2011 Time: 1430-1530 Time Calculation (min): 60 min  Skilled Therapeutic Intervention: Administered cognitive-linguistic evaluation. Please see below for details.   Problem List:  Patient Active Problem List  Diagnoses  . HYPOTHYROIDISM, UNSPECIFIED  . VITAMIN D DEFICIENCY  . HYPERLIPIDEMIA  . HYPERTENSION  . CORONARY ARTERY DISEASE  . CORONARY ATHEROSCLEROSIS NATIVE CORONARY ARTERY  . GASTROENTERITIS  . RENAL INSUFFICIENCY  . INGROWN TOENAIL  . OSTEOARTHRITIS  . CRAMPS,LEG  . SYNCOPE  . COUGH  . FIBROCYSTIC BREAST DISEASE  . WRIST PAIN  . ANKLE PAIN  . UNSPECIFIED CLOSED FRACTURE OF ANKLE  . ANKLE SPRAIN, RIGHT  . Hip pain  . LBP (low back pain)  . Status post lumbar surgery  . Aortic injury, S/P vascular surgery  . ARF (acute renal failure)  . Delirium, acute  . Pulmonary edema  . Hypokalemia  . Lumbar radiculopathy    Past Medical History:  Past Medical History  Diagnosis Date  . Hyperlipidemia   . Thyroid disease     hypo  . CHF (congestive heart failure)   . Mitral regurgitation   . Tricuspid regurgitation   . Anemia   . UTI (lower urinary tract infection)   . Arthritis     Osteoarthtistis  . Elevated glucose   . CVA (cerebral infarction)   . Vitamin d deficiency   . Hypothyroidism   . Myocardial infarction   . Chronic kidney failure     stage 3  . CAD (coronary artery disease)     sees Dr. Ricka Burdock last 03/2011  . Hypertension     Dr. Eden Emms cardiologist, Dr. Cheron Schaumann primary doctor   Past Surgical History:  Past Surgical History  Procedure Date  . Abdominal hysterectomy   . Back surgery   . Coronary stent placement   . Laparotomy 06/23/2011    Procedure: EXPLORATORY LAPAROTOMY;  Surgeon: Barnett Abu, MD;   Location: [facility];  Service: Neurosurgery;  Laterality: Bilateral;    Assessment & Plan Clinical Impression: Patient is a 78 y.o. year old right-handed female with history of back surgery 2005. Presented 06/23/2011 with low back pain radiating to the left lower extremity. X-ray and imaging revealed lumbar L2-3 stenosis, kyphoscoliosis and radiculopathy. Underwent bilateral laminotomies and foraminotomies decompression lumbar L2-3 with decompression of L2 and L3 nerve roots as well as removal of previously placed hardware lumbar L3-5, pedicle screw fixation lumbar 2-3 per Dr. Danielle Dess 06/23/2011. Patient developed acute arterial bleeding during the case with vascular surgery consulted to Dr. Imogene Burn and underwent repair of avulsed lumbar artery. During the procedure she lost an estimated 1800 cc of blood and did receive 2 units of packed red blood cells. On 4/24/ 13 noted right-sided neglect. MRI of the brain showed multiple acute-subacute nonhemorrhagic small infarcts throughout the supratentorial and infratentorial region. Question watershed type infarcts with embolic infarcts not excluded. 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 Neurology was consulted advised to continue aspirin and Plavix as prior to admission which had initially been held for noted back surgery. Carotid Doppler studies have been ordered with results pending. Patient with chronic renal insufficiency creatinine 1.5-1.8 and monitored with latest creatinine 1.44 on 06/30/2011. Therapies have  been initiated she is wearing a back corset when out of bed.   Patient transferred to CIR on 07/02/2011 . Patient is currently overall Max A for cognition and demonstrates decreased organization and sequencing, decreased orientation, decreased initiation, decreased sustained attention, decreased intellectual  awareness, decreased functional problem solving, decreased safety awareness, decreased working memory, and delayed processing  Prior to hospitalization, patient could complete ADLs with independence.   Patient will benefit from skilled SLP intervention to improved overall cognitive function and increase independence prior to discharge home with care partner. Anticipate patient will require 24 hour supervision and minimal-moderate cognitive assistance and follow up home health.   SLP - End of Session Patient left: in bed;with call bell/phone within reach;with family/visitor present Nurse Communication: Cognitive/Linguistic strategies reviewed Assessment SLP Recommendation/Assessment: Patient will need skilled Speech Lanaguage Pathology Services during CIR admission Rehab Potential: Good Barriers to Discharge: Decreased caregiver support (husband is unable to provide much physical assistance) Therapy Diagnosis: Cognitive Impairments SLP Plan SLP Frequency: 1-2 X/day, 30-60 minutes SLP Treatment/Interventions: Cueing hierarchy;Cognitive remediation/compensation;Internal/external aids;Therapeutic Activities;Multimodal communication approach;Environmental controls;Functional tasks;Patient/family education;Dysphagia/aspiration precaution training Recommendation Follow up Recommendations: Home Health SLP Equipment Recommended: None recommended by SLP  SLP Evaluation Pain Pain Assessment Pain Assessment: No/denies pain Pain Score:   6 Pain Type: Acute pain Pain Location: Generalized Pain Descriptors: Aching Pain Onset: On-going Patients Stated Pain Goal: 2 Pain Intervention(s): Repositioned;RN made aware Multiple Pain Sites: No Cognition Overall Cognitive Status: Impaired Arousal/Alertness: Awake/alert Orientation Level: Oriented to place;Oriented to person Attention: Sustained;Focused Focused Attention: Appears intact Sustained Attention: Impaired Sustained Attention Impairment:  Verbal basic;Functional basic Selective Attention: Impaired Selective Attention Impairment: Verbal basic;Functional basic Memory: Impaired Memory Impairment: Decreased recall of new information;Decreased short term memory;Storage deficit Decreased Short Term Memory: Verbal basic;Functional basic Awareness: Impaired Awareness Impairment: Intellectual impairment Problem Solving: Impaired Problem Solving Impairment: Verbal basic;Functional basic Executive Function: Reasoning;Sequencing;Organizing;Initiating Reasoning: Impaired Reasoning Impairment: Verbal basic;Functional basic Sequencing: Impaired Sequencing Impairment: Verbal basic;Functional basic Organizing: Impaired Organizing Impairment: Verbal basic;Functional basic Decision Making: Impaired Decision Making Impairment: Verbal basic;Functional basic Initiating: Impaired Initiating Impairment: Verbal basic;Functional basic Self Correcting: Impaired Self Correcting Impairment: Verbal basic;Functional basic Safety/Judgment: Impaired Comprehension Auditory Comprehension Overall Auditory Comprehension: Impaired Yes/No Questions: Impaired Complex Questions: 75-100% accurate Commands: Impaired Two Step Basic Commands: 50-74% accurate Multistep Basic Commands: 25-49% accurate Conversation: Simple Other Conversation Comments: Comprehension is impacted by pt's impaired sustained attention, decreased processing speed and impaired working memory.  Interfering Components: Attention;Pain;Processing speed;Working Radio broadcast assistant: Dietitian: Within [employer] Reading Comprehension Reading Status: Impaired Functional Environmental (signs, name badge): Within functional limits Interfering Components: Attention;Working Dance movement psychotherapist: Verbal cueing;Visual cueing Expression Expression Primary Mode of Expression: Verbal Verbal  Expression Overall Verbal Expression: Impaired Initiation: Impaired Automatic Speech: Name;Social Response Level of Generative/Spontaneous Verbalization: Sentence Repetition: No impairment Naming: No impairment Pragmatics: Impairment Impairments: Topic maintenance Interfering Components: Attention Effective Techniques: Semantic cues Other Verbal Expression Comments: Pt's verbal expression impacted, working memory, and impaired thought organization.  Written Expression Dominant Hand: Right Written Expression: Not tested Oral/Motor Oral Motor/Sensory Function Overall Oral Motor/Sensory Function: Appears within functional limits for tasks assessed Motor Speech Overall Motor Speech: Appears within functional limits for tasks assessed  See FIM for current functional status Refer to Care Plan for Long Term Goals  Recommendations for other services: None  Discharge Criteria: Patient will be discharged from SLP if patient refuses treatment 3 consecutive times without medical reason, if treatment goals not met, if there is a change in medical status,  if patient makes no progress towards goals or if patient is discharged from hospital.  The above assessment, treatment plan, treatment alternatives and goals were discussed and mutually agreed upon: by patient and by family  Jj Enyeart 07/07/2011, 4:14 PM  .

## 2011-07-07 NOTE — Care Management Note (Signed)
Per State Regulation 482.30 This chart was reviewed for medical necessity with respect to the patient's Admission/Duration of stay. Some CHF this weekend requiring 02 & lasix.   Better today.   Pt participating txs, needs cuing.     Brock Ra                 Nurse Care Manager              Next Review Date: 07/10/11

## 2011-07-07 NOTE — Progress Notes (Signed)
Occupational Therapy Session Note  Patient Details  Name: Jean [name] MRN: [id number] Date of Birth: August 25, 1928  Today's Date: 07/07/2011 Time: 4098-1191 Time Calculation (min): 45 min  Short Term Goals: Week 1:  OT Short Term Goal 1 (Week 1): Pt will sit EOB for 3 min wtih min A in prep for ADL tasks OT Short Term Goal 2 (Week 1): Pt will demonstrate sustained attention with min A during funcitonal grooming tasks OT Short Term Goal 3 (Week 1): Pt will transfer with LRAD with max +1 for basic transfers OT Short Term Goal 4 (Week 1): Pt will don shirt with mod A in sitting   Skilled Therapeutic Interventions/Progress Updates:    self care retraining with focus on dressing and grooming only: focus on: LB dressing in bed rolling side to side with bed rail and knees bent with mod A for rotation of hips, sidelying to sit with total A, sitting balance at EOB with min to max A with one LOB posterior with decreasing postural control/righting reactions, slide board transfer bed to w/c with mod A with extra time, grooming at sink with mod  A for attention to complete tasks and for simple problem solving, eating breakfast with min a for setup with questioning cues.  Therapy Documentation Precautions:  Precautions Precautions: Back;Fall Precaution Comments: required total A for recall of 3/3 precautions Required Braces or Orthoses: Spinal Brace Spinal Brace: Lumbar corset Restrictions Weight Bearing Restrictions: No    Pain: Abdominal pain with movement; relief with rest in the chair  See FIM for current functional status  Therapy/Group: Individual Therapy  Roney Mans [facility] 07/07/2011, 2:37 PM

## 2011-07-07 NOTE — Progress Notes (Signed)
INITIAL ADULT NUTRITION ASSESSMENT Date: 07/07/2011   Time: 11:26 AM  Reason for Assessment: Dysphagia  ASSESSMENT: Female 78 y.o.  Dx: Lumbar radiculopathy  Hx:  Past Medical History  Diagnosis Date  . Hyperlipidemia   . Thyroid disease     hypo  . CHF (congestive heart failure)   . Mitral regurgitation   . Tricuspid regurgitation   . Anemia   . UTI (lower urinary tract infection)   . Arthritis     Osteoarthtistis  . Elevated glucose   . CVA (cerebral infarction)   . Vitamin d deficiency   . Hypothyroidism   . Myocardial infarction   . Chronic kidney failure     stage 3  . CAD (coronary artery disease)     sees Dr. Ricka Burdock last 03/2011  . Hypertension     Dr. Eden Emms cardiologist, Dr. Cheron Schaumann primary doctor   Past Surgical History  Procedure Date  . Abdominal hysterectomy   . Back surgery   . Coronary stent placement   . Laparotomy 06/23/2011    Procedure: EXPLORATORY LAPAROTOMY;  Surgeon: Barnett Abu, MD;  Location: [facility];  Service: Neurosurgery;  Laterality: Bilateral;   Related Meds:     . aspirin  81 mg Oral Daily  . clopidogrel  75 mg Oral Q breakfast  . feeding supplement  1 Container Oral BID BM  . furosemide  40 mg Oral BID  . levothyroxine  75 mcg Oral QAC breakfast  . methyldopa  250 mg Oral BID  . pantoprazole  40 mg Oral Q1200  . DISCONTD: metoprolol tartrate  150 mg Oral BID  . DISCONTD: risperiDONE  0.5 mg Oral QHS   Ht: 5\' 7"  (170.2 cm)  Wt: 167 lb 15.9 oz (76.2 kg)  Ideal Wt: 61.4 kg % Ideal Wt: 124%  Wt Readings from Last 15 Encounters:  07/02/11 167 lb 15.9 oz (76.2 kg)  07/02/11 171 lb 8.3 oz (77.8 kg)  07/02/11 171 lb 8.3 oz (77.8 kg)  07/02/11 171 lb 8.3 oz (77.8 kg)  06/20/11 153 lb (69.4 kg)  06/04/11 148 lb (67.132 kg)  05/16/11 152 lb 9.6 oz (69.219 kg)  03/05/11 151 lb (68.493 kg)  12/25/10 150 lb (68.04 kg)  08/19/10 146 lb (66.225 kg)  07/31/10 147 lb 6.4 oz (66.86 kg)  06/27/10 149 lb 1.9 oz (67.64 kg)    05/16/10 147 lb 8 oz (66.906 kg)  05/14/10 219 lb (99.338 kg)  03/14/10 143 lb (64.864 kg)  Usual Wt: 145-150 lb % Usual Wt: 111%  Body mass index is 26.31 kg/(m^2). Pt meets criteria for overweight.  Food/Nutrition Related Hx: Regular diet PTA; slow eater per husband  Labs:  CMP     Component Value Date/Time   NA 138 07/05/2011 0600   K 3.6 07/05/2011 0600   CL 105 07/05/2011 0600   CO2 22 07/05/2011 0600   GLUCOSE 109* 07/05/2011 0600   GLUCOSE 100* 01/30/2006 1630   BUN 24* 07/05/2011 0600   CREATININE 1.43* 07/05/2011 0600   CALCIUM 8.6 07/05/2011 0600   CALCIUM 9.5 08/18/2006 0105   PROT 5.4* 07/03/2011 0622   ALBUMIN 2.0* 07/03/2011 0622   AST 34 07/03/2011 0622   ALT 25 07/03/2011 0622   ALKPHOS 103 07/03/2011 0622   BILITOT 0.6 07/03/2011 0622   GFRNONAA 33* 07/05/2011 0600   GFRAA 39* 07/05/2011 0600  Phosphorus 3.1 WNL  Magnesium 1.6 WNL   Intake/Output Summary (Last 24 hours) at 07/07/11 1128 Last data filed at 07/07/11 0900  Gross per 24 hour  Intake    120 ml  Output   2675 ml  Net  -2555 ml   Diet Order: Dysphagia 3 with nectar thickened liquids  Supplements/Tube Feeding: Resource Breeze PO BID  IVF:    Estimated Nutritional Needs:   Kcal:  1600 - 1800 kcal Protein:  80 - 90 grams protein Fluid:  1.8 - 2 L/d  RD followed pt during acute hospitalization. MBSS performed 5/3 recommending Dysphagia 3 diet with nectar thickened liquids. Noted per SLP evaluation that suspect this is an acute reversible dysphagia that will resolved as pt's overall conditioning improves. Plan to repeat MBSS for an upgrade.  Intake is variable, ranging from 10 - 50%. Pt denies any questions or concerns with current diet.  Pt at nutrition risk given variable intake, advanced age and recent acute injury/surgery.  Weights trending back down to usual body weight.  NUTRITION DIAGNOSIS: -Inadequate oral intake (NI-2.1).  Status: Ongoing  RELATED TO: variable appetite  AS EVIDENCE BY: fluctuating PO  intake  MONITORING/EVALUATION(Goals): Goal: Pt to consume >/= 75% of meals and supplements. Monitor: PO intake, weights, labs, I/O's, diet order and liquid consistency  EDUCATION NEEDS: -Education needs addressed  INTERVENTION: 1. Encouraged fluid intake and discussed importance of hydration. 2. Ensure Pudding PO TID. Offer magic cup BID. 3. RD to continue to follow nutrition care plan  Dietitian #: [phone]  DOCUMENTATION CODES Per approved criteria  -Not Applicable    Adair Laundry 07/07/2011, 11:26 AM

## 2011-07-07 NOTE — Progress Notes (Signed)
At 2030 Patient lethargic, unable to wake for vitals or meds. Would give one word response with eyes closed. B/P=135/72 and HR 56, O2 sat 96% RA. No further wheezing noted. At 2100 paged Dr.Kirsteins related to above info, orders given to hold lopressor and risperdal, also send UA C&S, urine cloudy and amber, from foley. At 2215 urine spec sent. 1 small incont stool during the night, patient unaware. At 2210 able to wake patient to take scheduled meds, crushed in applesauce. Ate container of applesauce and drank 120cc's of nectar thick apple juice. Has been awake all night. Much more alert and oriented this AM. Dressings clean, dry and intact. Jean [name]

## 2011-07-07 NOTE — Progress Notes (Signed)
Physical Therapy Note  Patient Details  Name: Jean [name] MRN: [id number] Date of Birth: [birth year] Today's Date: 07/07/2011  Time: 1000-1100 60 minutes  No c/o pain.  Pt with improved arousal and attention today vs yesterday.  Sliding board transfers w/c <> bed with mod A, pt with improved initiation and sequencing to assist with transfer.  Seated balance edge of mat S with B UE support, min-max A without UE support, increasing assist with fatigue.  Pt able to perform seated edge of mat horseshoe toss game with focus on reaching and promoting fwd wt shift x 15 minutes with min-mod cues for attention to task and increased time for movement initiation.  Attempt standing with RW and 3 musketeers style, pt unable to stand fully due to posterior lean, decreased trunk extensor strength.  Standing in standing frame x 10 minutes with reaching task to promote upright posture and increased abdominal and trunk extensor strength.  Pt with overall improved activity tolerance today, continues to be limited by decreased muscular endurance and generalized weakness as well as decreased attention and initiation.  Individual therapy   Jacora Hopkins 07/07/2011, 3:53 PM

## 2011-07-07 NOTE — Progress Notes (Signed)
Speech Language Pathology Daily Session Note  Patient Details  Name: Jean [name] MRN: [id number] Date of Birth: [birth year]  Today's Date: 07/07/2011 Time: 1200-1230 Time Calculation (min): 30 min  Skilled Therapeutic Interventions: Session focused on group treatment with OT; SLP facilitated session with mod-max assist semantic cues to initiate tasks, demonstrate alternating attention to self feeding task and complete basic problem solving.  Patient demonstrated timely mastication of regular textures with no observed pocketing.   Daily Session FIM:  FIM - Eating Eating Activity: 5: Supervision/cues General    Pain Pain Assessment Pain Assessment: No/denies pain Pain Score: 0-No pain  Therapy/Group: Group Therapy  Charlane Ferretti., CCC-SLP [phone]  Jean [name] 07/07/2011, 1:18 PM

## 2011-07-07 NOTE — Progress Notes (Signed)
Occupational Therapy Session Note  Patient Details  Name: Jean [name] MRN: [id number] Date of Birth: 06-13-1928  Today's Date: 07/07/2011 Time: 1130-1200 Time Calculation (min): 30 min   Skilled Therapeutic Interventions/Progress Updates:    Feeding group with focus on LUE functional use initiation and selective attention.  Pt needing min instructional cues to progress with feeding herself secondary to decreased selective attention and distraction with conversation.  Using the LUE for holding sandwich and pudding cup without difficulty.    Therapy Documentation Precautions:  Precautions Precautions: Back;Fall Precaution Comments: required total A for recall of 3/3 precautions Required Braces or Orthoses: Spinal Brace Spinal Brace: Lumbar corset Restrictions Weight Bearing Restrictions: No  Pain: Pain Assessment Pain Assessment: No/denies pain ADL: See FIM for current functional status  Therapy/Group: Group Therapy  Tracia Lacomb 07/07/2011, 12:44 PM

## 2011-07-07 NOTE — Progress Notes (Signed)
Patient ID: Jean [name], female   DOB: April 11, 1928, 78 y.o.   MRN: [id number] Patient ID: Jean [name], female   DOB: 01-Apr-1928, 78 y.o.   MRN: [id number] Subjective/Complaints: Comfortable this am. Eating brekafast. Back sore Ros performed and otherwise negative  Objective: Vital Signs: Blood pressure 136/73, pulse 59, temperature 98.1 F (36.7 C), temperature source Oral, resp. rate 16, height 5\' 7"  (1.702 m), weight 76.2 kg (167 lb 15.9 oz), SpO2 94.00%. Dg Chest 2 View  07/07/2011  *RADIOLOGY REPORT*  Clinical Data: Shortness of breath  CHEST - 2 VIEW  Comparison: 07/06/2011  Findings: Nearly resolved interstitial edema is noted with only residual central vascular congestion.  Small pleural effusions, left greater than right, persist.  Heart size is mildly enlarged but smaller than previously.  LAD stent noted.  Epigastric clips in place.  IMPRESSION: Decreased pulmonary edema.  Small pleural effusions.  Original Report Authenticated By: Harrel Lemon, M.D.   Dg Chest Port 1 View  07/06/2011  *RADIOLOGY REPORT*  Clinical Data: Shortness of breath; decreased O2 saturation. Wheezing and congestion.  PORTABLE CHEST - 1 VIEW  Comparison: Chest radiograph performed 06/28/2011  Findings: The lungs are well-aerated.  Vascular congestion is noted, with increased interstitial markings, raising concern for pulmonary edema.  Superimposed pneumonia cannot be excluded.  A small left pleural effusion is likely present.  No pneumothorax is seen.  The cardiomediastinal silhouette is borderline enlarged.  No acute osseous abnormalities are seen.  IMPRESSION: Vascular congestion and borderline cardiomegaly, with increased interstitial markings, raising concern for pulmonary edema; superimposed pneumonia cannot be excluded.  Likely small left pleural effusion.  Original Report Authenticated By: Tonia Ghent, M.D.    Basename 07/06/11 0940  WBC 11.4*  HGB 11.4*  HCT 34.5*  PLT 353    Basename 07/05/11  0600  NA 138  K 3.6  CL 105  CO2 22  GLUCOSE 109*  BUN 24*  CREATININE 1.43*  CALCIUM 8.6   CBG (last 3)  No results found for this basename: GLUCAP:3 in the last 72 hours  Wt Readings from Last 3 Encounters:  07/02/11 76.2 kg (167 lb 15.9 oz)  07/02/11 77.8 kg (171 lb 8.3 oz)  07/02/11 77.8 kg (171 lb 8.3 oz)    Physical Exam:  General appearance: alert, cooperative, appears stated age and no distress Head: Normocephalic, without obvious abnormality, atraumatic Eyes: conjunctivae/corneas clear. PERRL, EOM's intact.  Ears: normal external ear canals both ears Nose: Nares normal. Septum midline. Mucosa normal. No drainage or sinus tenderness. Throat: lips, mucosa, and tongue normal; teeth and gums normal Neck: no adenopathy, no carotid bruit, no JVD, supple, symmetrical, trachea midline and thyroid not enlarged, symmetric, no tenderness/mass/nodules Back: tender superior to incision. posture poor.  Resp: clear to auscultation bilaterally, no obvious wheezes or rales Cardio: regular rate and rhythm, S1, S2 normal, no murmur, click, rub or gallop GI: soft, non-tender; bowel sounds normal; no masses,  no organomegaly Extremities: extremities normal, atraumatic, no cyanosis or edema Pulses: 1+ edema lue Skin: Skin color, texture, turgor normal. No rashes or lesions Neurologic: delayed processing but a little better. Can feed herself with extra time and some cues. Gross, generalized weakness--let more than right (inconsistent) no focal CN findings. Limited insight and awareness. 4/5 Strength BUE and BLE Incision/Wound: clean and intact   Assessment/Plan: 1. Functional deficits secondary to lumbar stenosis, radiculopathy, with post op watershed infarct which require 3+ hours per day of interdisciplinary therapy in a comprehensive inpatient rehab setting. Physiatrist is  providing close team supervision and 24 hour management of active medical problems listed below. Physiatrist and  rehab team continue to assess barriers to discharge/monitor patient progress toward functional and medical goals. FIM: FIM - Bathing Bathing: 0: Activity did not occur  FIM - Upper Body Dressing/Undressing Upper body dressing/undressing steps patient completed: Thread/unthread left bra strap;Thread/unthread left sleeve of pullover shirt/dress Upper body dressing/undressing: 1: Total-Patient completed less than 25% of tasks (pt. required extended time and constant cueing) FIM - Lower Body Dressing/Undressing Lower body dressing/undressing: 1: Total-Patient completed less than 25% of tasks (sit to stand with SARA)  FIM - Toileting Toileting: 0: Activity did not occur  FIM - Archivist Transfers: 0-Activity did not occur  FIM - Banker Devices: Sliding board Bed/Chair Transfer: 1: Mechanical lift (or Total A x2 for stand piviot)  FIM - Locomotion: Wheelchair Locomotion: Wheelchair: 1: Travels less than 50 ft with maximal assistance (Pt: 25 - 49%) FIM - Locomotion: Ambulation Locomotion: Ambulation Assistive Devices: Designer, industrial/product Ambulation/Gait Assistance: 1: +2 Total assist Locomotion: Ambulation: 1: Two helpers  Comprehension Comprehension Mode: Auditory Comprehension: 1-Understands basic less than 25% of the time/requires cueing 75% of the time  Expression Expression Mode: Verbal Expression: 4-Expresses basic 75 - 89% of the time/requires cueing 10 - 24% of the time. Needs helper to occlude trach/needs to repeat words.  Social Interaction Social Interaction: 4-Interacts appropriately 75 - 89% of the time - Needs redirection for appropriate language or to initiate interaction.  Problem Solving Problem Solving: 1-Solves basic less than 25% of the time - needs direction nearly all the time or does not effectively solve problems and may need a restraint for safety  Memory Memory: 2-Recognizes or recalls 25 - 49% of the  time/requires cueing 51 - 75% of the time  1. Lumbar stenosis with radiculopathy. Status post bilateral laminotomies with decompression and removal of previous hardware. Complicated by acute arterial bleeding with vascular repair 06/23/2011  2. 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 Continue aspirin Plavix. Carotid Dopplers are pending  3. DVT Prophylaxis/Anticoagulation: SCDs. Monitor for any signs of deep vein thrombosis  4. Hypertension. aldomet 250 mg twice a day, Lopressor 150 mg twice a day. Monitor with increased mobility  5. Hypothyroidism. Synthroid  6. Mood/postoperative confusion. Presently on low dose Risperdal. Will continue to monitor. Speech therapy to followup for cognitive eval after recent stroke. Need to restore sleep wake cycle. Be careful with pain medications 7. FEN: mild renal insufficiency  -push po fluids and po intake as a whole  -CR slightly increased, BUN slightly down.   -follow serial BMET's-may have stay dry given CO. 8.  CHF exacerbation EF is 40-45%- lasix added with good results. cxr improved today.   -monitor i's and o's as well as renal fxn  LOS (Days) 5 A FACE TO FACE EVALUATION WAS PERFORMED  Jabreel Chimento T 07/07/2011, 8:30 AM

## 2011-07-08 ENCOUNTER — Other Ambulatory Visit: Payer: Self-pay

## 2011-07-08 DIAGNOSIS — N289 Disorder of kidney and ureter, unspecified: Secondary | ICD-10-CM

## 2011-07-08 DIAGNOSIS — N390 Urinary tract infection, site not specified: Secondary | ICD-10-CM

## 2011-07-08 DIAGNOSIS — A4189 Other specified sepsis: Secondary | ICD-10-CM

## 2011-07-08 DIAGNOSIS — M4716 Other spondylosis with myelopathy, lumbar region: Secondary | ICD-10-CM

## 2011-07-08 DIAGNOSIS — Z5189 Encounter for other specified aftercare: Secondary | ICD-10-CM

## 2011-07-08 DIAGNOSIS — I6330 Cerebral infarction due to thrombosis of unspecified cerebral artery: Secondary | ICD-10-CM

## 2011-07-08 LAB — BASIC METABOLIC PANEL
BUN: 26 mg/dL — ABNORMAL HIGH (ref 6–23)
CO2: 30 mEq/L (ref 19–32)
Calcium: 9.2 mg/dL (ref 8.4–10.5)
Glucose, Bld: 113 mg/dL — ABNORMAL HIGH (ref 70–99)
Potassium: 3.7 mEq/L (ref 3.5–5.1)
Sodium: 138 mEq/L (ref 135–145)

## 2011-07-08 LAB — URINE CULTURE
Colony Count: >=100000
Culture  Setup Time: 201305060217

## 2011-07-08 LAB — CARDIAC PANEL(CRET KIN+CKTOT+MB+TROPI)
Relative Index: INVALID (ref 0.0–2.5)
Total CK: 18 U/L (ref 7–177)
Total CK: 18 U/L (ref 7–177)
Troponin I: <0.3 ng/mL (ref <0.30)

## 2011-07-08 LAB — GLUCOSE, CAPILLARY

## 2011-07-08 MED ORDER — METOPROLOL TARTRATE 100 MG PO TABS
100.0000 mg | ORAL_TABLET | Freq: Two times a day (BID) | ORAL | Status: DC
  Administered 2011-07-08 – 2011-07-22 (×30): 100 mg via ORAL
  Filled 2011-07-08 (×34): qty 1

## 2011-07-08 MED ORDER — DEXTROSE 5 % IV SOLN
1.0000 g | INTRAVENOUS | Status: DC
  Administered 2011-07-08 – 2011-07-09 (×2): 1 g via INTRAVENOUS
  Filled 2011-07-08 (×3): qty 10

## 2011-07-08 NOTE — Progress Notes (Signed)
Physical Therapy Note  Patient Details  Name: Jean [name] MRN: [id number] Date of Birth: 28-Mar-1928 Today's Date: 07/08/2011  Time: 900-945 45 minutes  No c/o pain.  Pt mod A with sliding board transfers bed <> w/c with improved forward wt shift and initiation noted.  Pt able to raise bottom slightly to assist with transfer.  Seated balance edge of mat with supervision with 1 UE support, min A with 0 UE support.  Pt able to participate in reaching out of BOS with focus on core muscle strength, endurance and control x 15 minutes with 1 LOB with mod A to correct.  Sit to stand from elevated surface 3 attempts at mod-max A.  Pt requires +2 assist to achieve trunk extension in standing to achieve full upright posture.  Pt unable to maintain trunk extension in standing without assist.  After standing pt c/o "something doesn't feel right".  Pt began spitting up white fluid, becoming less responsive.  RN, PA made aware.  Pt assisted back to room with +2 assist for all mobility.  Individual therapy   Gerald Honea 07/08/2011, 10:07 AM

## 2011-07-08 NOTE — Progress Notes (Signed)
Speech Language Pathology Daily Session Note  Patient Details  Name: Jean [name] MRN: [id number] Date of Birth: 1928/09/25  Today's Date: 07/08/2011 Time: 1330-1400 Time Calculation (min): 30 min  Short Term Goals:  SLP Short Term Goal 1 (Week 1): Pt will demonstrate sustained attention to a functional task for 3 mins with Mod A verbal and semantic cues for redirection.  SLP Short Term Goal 2 (Week 1): Pt will utilize visual aids (schedule, etc.) to recall daily informaton with Mod verbal and semantic cues.  SLP Short Term Goal 3 (Week 1): Pt will initiaite functional tasks with Mod A semantic and visual cues. SLP Short Term Goal 4 (Week 1): Pt will identify 1 physical and 1 cognitive deficit with Mod A verbal and semantic cues.  SLP Short Term Goal 5 (Week 1): Pt will utilize swallowing compensatory strategies with supervision verbal cues.   Skilled Therapeutic Interventions: Treatment focus on initiation and problem solving. Pt required total A for initiation of functional task for sorting task with verbal, visual, demonstration, and tactile cues. Pt also required Max-Total A for verbal expression, especially in regards to wants/needs. Daughter present and also expressing concern about pt's lack of engagement/interaction. RN made aware.   Daily Session FIM:  Comprehension Comprehension Mode: Auditory Comprehension: 2-Understands basic 25 - 49% of the time/requires cueing 51 - 75% of the time Expression Expression Mode: Verbal Expression: 1-Expresses basis less than 25% of the time/requires cueing greater than 75% of the time. Social Interaction Social Interaction: 1-Interacts appropriately less than 25% of the time. May be withdrawn or combative. Problem Solving Problem Solving: 1-Solves basic less than 25% of the time - needs direction nearly all the time or does not effectively solve problems and may need a restraint for safety Memory Memory: 1-Recognizes or recalls less than 25%  of the time/requires cueing greater than 75% of the time Pain Pain Assessment Pain Assessment: No/denies pain  Therapy/Group: Individual Therapy  Lauren Modisette 07/08/2011, 2:14 PM

## 2011-07-08 NOTE — Progress Notes (Signed)
Pt working with therapy in gym at 0945, pt felt"not right", pt became lethargic. Pt  Spit up a small amount of saliva, Vital Signs- 154/100-144, Blood sugar 182, lungs were clear with diminished bases at baseline,EKG performed, Dan Finland PA was present, orders obtained and intitiated see MAR Lopressor given PO. Pt oriented to self at baseline, pt pale. Pt back to room, states" I'm feeling better", . Vital Signs at 10:24- 120/79-138. Pt drank 120cc of juice. Husband at bedside notified of situation that did occur.Pt resting in bed will continue to monitor.

## 2011-07-08 NOTE — Progress Notes (Signed)
SLP Cancellation Note  Treatment cancelled today due to medical issues with patient which prohibited therapy. As a result patient missed 60 minutes of skilled therapy with SLP/OT.    Fae Pippin, M.A., CCC-SLP [phone]  Kym Fenter 07/08/2011, 12:33 PM

## 2011-07-08 NOTE — Progress Notes (Signed)
Occupational Therapy Session Note  Patient Details  Name: Jean [name] MRN: [id number] Date of Birth: [birth year]  Today's Date: 07/08/2011 Time: 9629-5284 Time Calculation (min): 45 min  Short Term Goals: Week 1:  OT Short Term Goal 1 (Week 1): Pt will sit EOB for 3 min wtih min A in prep for ADL tasks OT Short Term Goal 2 (Week 1): Pt will demonstrate sustained attention with min A during funcitonal grooming tasks OT Short Term Goal 3 (Week 1): Pt will transfer with LRAD with max +1 for basic transfers OT Short Term Goal 4 (Week 1): Pt will don shirt with mod A in sitting   Skilled Therapeutic Interventions/Progress Updates:    1:1 self care retraining with focus on dressing only. Pt performed rolling bed mobility with rail with steadying A to maintain back precautions, sidelying to sit with mod A with increased ability to manage legs, worked on sitting balance at EOB with consistent steadying A to maintain balance to don shirt with max cuing to attention to task, used reacher (pt has one at home she uses) to thread pants with max  instructional and tactile cues. Sit to stand with 3 musketeers for A to pull up pants and then performed short distance functional ambulation to get into w/c. Pt became very fatigue with this task resulting in difficulty with turing to sit in chair. Grooming at sink with contextual cues and extra time with mod cuing for initiation and attention  Therapy Documentation Precautions:  Precautions Precautions: Back;Fall Precaution Comments: required total A for recall of 3/3 precautions Required Braces or Orthoses: Spinal Brace Spinal Brace: Lumbar corset Restrictions Weight Bearing Restrictions: No Pain:  no c/o pain  See FIM for current functional status  Therapy/Group: Individual Therapy  Roney Mans [facility] 07/08/2011, 12:12 PM

## 2011-07-08 NOTE — Progress Notes (Signed)
Physical Therapy Note  Patient Details  Name: Jean [name] MRN: [id number] Date of Birth: 1928-07-17 Today's Date: 07/08/2011  Pt just returned from gym where she had "an episode" and pt in bed, nurse request PT hold treatment this session, missed 30 min.   Michaelene Song 07/08/2011, 10:07 AM

## 2011-07-08 NOTE — Progress Notes (Signed)
Subjective/Complaints: Comfortable this am. Continues to be more alert in the am Ros performed and otherwise negative  Objective: Vital Signs: Blood pressure 137/91, pulse 127, temperature 98.1 F (36.7 C), temperature source Oral, resp. rate 20, height 5\' 7"  (1.702 m), weight 70.1 kg (154 lb 8.7 oz), SpO2 93.00%. Dg Chest 2 View  07/07/2011  *RADIOLOGY REPORT*  Clinical Data: Shortness of breath  CHEST - 2 VIEW  Comparison: 07/06/2011  Findings: Nearly resolved interstitial edema is noted with only residual central vascular congestion.  Small pleural effusions, left greater than right, persist.  Heart size is mildly enlarged but smaller than previously.  LAD stent noted.  Epigastric clips in place.  IMPRESSION: Decreased pulmonary edema.  Small pleural effusions.  Original Report Authenticated By: Harrel Lemon, M.D.    Basename 07/06/11 0940  WBC 11.4*  HGB 11.4*  HCT 34.5*  PLT 353    Basename 07/08/11 0632  NA 138  K 3.7  CL 98  CO2 30  GLUCOSE 113*  BUN 26*  CREATININE 1.62*  CALCIUM 9.2   CBG (last 3)  No results found for this basename: GLUCAP:3 in the last 72 hours  Wt Readings from Last 3 Encounters:  07/08/11 70.1 kg (154 lb 8.7 oz)  07/02/11 77.8 kg (171 lb 8.3 oz)  07/02/11 77.8 kg (171 lb 8.3 oz)    Physical Exam:  General appearance: alert, cooperative, appears stated age and no distress Head: Normocephalic, without obvious abnormality, atraumatic Eyes: conjunctivae/corneas clear. PERRL, EOM's intact.  Ears: normal external ear canals both ears Nose: Nares normal. Septum midline. Mucosa normal. No drainage or sinus tenderness. Throat: lips, mucosa, and tongue normal; teeth and gums normal Neck: no adenopathy, no carotid bruit, no JVD, supple, symmetrical, trachea midline and thyroid not enlarged, symmetric, no tenderness/mass/nodules Back: tender superior to incision. posture poor.  Resp: clear to auscultation bilaterally, no obvious wheezes or  rales Cardio: regular rate and rhythm, S1, S2 normal, no murmur, click, rub or gallop GI: soft, non-tender; bowel sounds normal; no masses,  no organomegaly Extremities: extremities normal, atraumatic, no cyanosis or edema Pulses: 1+ edema lue Skin: Skin color, texture, turgor normal. No rashes or lesions Neurologic: delayed processing but better. Can feed herself with extra time and some cues. Gross, generalized weakness--let more than right (inconsistent) no focal CN findings. Limited insight and awareness. 4/5 Strength BUE and BLE Incision/Wound: clean and intact   Assessment/Plan: 1. Functional deficits secondary to lumbar stenosis, radiculopathy, with post op watershed infarct which require 3+ hours per day of interdisciplinary therapy in a comprehensive inpatient rehab setting. Physiatrist is providing close team supervision and 24 hour management of active medical problems listed below. Physiatrist and rehab team continue to assess barriers to discharge/monitor patient progress toward functional and medical goals. FIM: FIM - Bathing Bathing: 0: Activity did not occur  FIM - Upper Body Dressing/Undressing Upper body dressing/undressing steps patient completed: Thread/unthread left sleeve of pullover shirt/dress;Put head through opening of pull over shirt/dress Upper body dressing/undressing: 4: Min-Patient completed 75 plus % of tasks FIM - Lower Body Dressing/Undressing Lower body dressing/undressing steps patient completed: Thread/unthread right pants leg;Thread/unthread left pants leg Lower body dressing/undressing: 1: Two helpers (+2 for sit to stand)  FIM - Toileting Toileting: 1: Two helpers  FIM - Archivist Transfers: 0-Activity did not occur  FIM - Architectural technologist Transfer: 1: Two helpers (stand pivot)  FIM - Locomotion: Wheelchair Locomotion: Wheelchair: 1: Travels less than 50  ft with  moderate assistance (Pt: 50 - 74%) FIM - Locomotion: Ambulation Locomotion: Ambulation Assistive Devices: Walker - Rolling Ambulation/Gait Assistance: 1: +2 Total assist Locomotion: Ambulation: 0: Activity did not occur  Comprehension Comprehension Mode: Auditory Comprehension: 4-Understands basic 75 - 89% of the time/requires cueing 10 - 24% of the time  Expression Expression Mode: Verbal Expression: 4-Expresses basic 75 - 89% of the time/requires cueing 10 - 24% of the time. Needs helper to occlude trach/needs to repeat words.  Social Interaction Social Interaction: 4-Interacts appropriately 75 - 89% of the time - Needs redirection for appropriate language or to initiate interaction.  Problem Solving Problem Solving: 2-Solves basic 25 - 49% of the time - needs direction more than half the time to initiate, plan or complete simple activities  Memory Memory: 2-Recognizes or recalls 25 - 49% of the time/requires cueing 51 - 75% of the time  1. Lumbar stenosis with radiculopathy. Status post bilateral laminotomies with decompression and removal of previous hardware. Complicated by acute arterial bleeding with vascular repair 06/23/2011  2. 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 Continue aspirin Plavix. Carotid Dopplers are pending  3. DVT Prophylaxis/Anticoagulation: SCDs. Monitor for any signs of deep vein thrombosis  4. Hypertension. aldomet 250 mg twice a day, Lopressor 150 mg twice a day. Monitor with increased mobility  5. Hypothyroidism. Synthroid  6. Mood/postoperative confusion. Presently on low dose Risperdal. Will continue to monitor. Speech therapy to followup for cognitive eval after recent stroke. Need to restore sleep wake cycle. Be careful with pain medications 7. FEN: mild renal insufficiency  -will push po fluids  -CR slightly increased, BUN slightly down.   -watch for fluid overload given reduced CO. Hold lasix 8.  CHF exacerbation  EF is 40-45%- lasix added with good results but will hold given above.   -monitor i's and o's as well as renal fxn  LOS (Days) 6 A FACE TO FACE EVALUATION WAS PERFORMED  Jean [name] T 07/08/2011, 8:43 AM

## 2011-07-08 NOTE — Progress Notes (Signed)
Pt complained of chest pain and abdominal pain at 1430, VSS, Dan Finland PA notified. Orders obtained. Pt denied any pain at 1500. Family at bedside, call bell in reach, will continue to monitor

## 2011-07-09 DIAGNOSIS — Z5189 Encounter for other specified aftercare: Secondary | ICD-10-CM

## 2011-07-09 DIAGNOSIS — M4716 Other spondylosis with myelopathy, lumbar region: Secondary | ICD-10-CM

## 2011-07-09 DIAGNOSIS — I6330 Cerebral infarction due to thrombosis of unspecified cerebral artery: Secondary | ICD-10-CM

## 2011-07-09 LAB — BASIC METABOLIC PANEL
BUN: 29 mg/dL — ABNORMAL HIGH (ref 6–23)
Chloride: 98 mEq/L (ref 96–112)
Glucose, Bld: 106 mg/dL — ABNORMAL HIGH (ref 70–99)
Potassium: 3.6 mEq/L (ref 3.5–5.1)

## 2011-07-09 LAB — CARDIAC PANEL(CRET KIN+CKTOT+MB+TROPI)
Relative Index: INVALID (ref 0.0–2.5)
Troponin I: <0.3 ng/mL (ref <0.30)

## 2011-07-09 NOTE — Patient Care Conference (Signed)
Inpatient RehabilitationTeam Conference Note Date: 07/08/2011   Time: 3:40 AM    Patient Name: Jean [name]      Medical Record Number: [id number]  Date of Birth: April 02, 1928 Sex: Female         Room/Bed: 4155/4155-01 Payor Info: Payor: MEDICARE  Plan: MEDICARE PART A AND B  Product Type: *No Product type*     Admitting Diagnosis: Decompf/ fusion, new cva  Admit Date/Time:  07/02/2011  4:37 PM Admission Comments: No comment available   Primary Diagnosis:  Lumbar radiculopathy Principal Problem: Lumbar radiculopathy  Patient Active Problem List  Diagnoses Date Noted  . Lumbar radiculopathy 07/03/2011  . Pulmonary edema 06/30/2011  . Hypokalemia 06/30/2011  . Delirium, acute 06/25/2011  . Status post lumbar surgery 06/23/2011  . Aortic injury, S/P vascular surgery 06/23/2011  . ARF (acute renal failure) 06/23/2011  . LBP (low back pain) 06/04/2011  . Hip pain 03/05/2011  . UNSPECIFIED CLOSED FRACTURE OF ANKLE 05/16/2010  . ANKLE SPRAIN, RIGHT 05/16/2010  . WRIST PAIN 05/14/2010  . ANKLE PAIN 05/14/2010  . FIBROCYSTIC BREAST DISEASE 03/14/2010  . CORONARY ATHEROSCLEROSIS NATIVE CORONARY ARTERY 11/15/2009  . GASTROENTERITIS 06/05/2009  . SYNCOPE 06/05/2009  . INGROWN TOENAIL 02/05/2009  . RENAL INSUFFICIENCY 12/13/2007  . VITAMIN D DEFICIENCY 08/11/2007  . CRAMPS,LEG 06/08/2007  . COUGH 05/26/2007  . HYPERLIPIDEMIA 12/28/2006  . HYPERTENSION 12/28/2006  . CORONARY ARTERY DISEASE 12/28/2006  . OSTEOARTHRITIS 12/28/2006  . HYPOTHYROIDISM, UNSPECIFIED 12/23/2006    Expected Discharge Date: Expected Discharge Date: 07/23/11  Team Members Present: Physician: Dr. Faith Rogue Case Manager Present: Melanee Spry, RN Social Worker Present: Amada Jupiter, LCSW Nurse Present: Rosalio Macadamia, RN PT Present: Reggy Eye, PT OT Present: Roney Mans, Felipa Eth, OT SLP Present: Feliberto Gottron, SLP Other (Discipline and Name): Tora Duck, PPS Coordinator     Current  Status/Progress Goal Weekly Team Focus  Medical   lumbar surgery with post op stroke. slow to initiate. more sluggish this afternoon. UTI  medical stability, adequate nutrition  see above   Bowel/Bladder   pt can be incont. at time  no incont with min assist      Swallow/Nutrition/ Hydration   Dys. 3 textures with nectar-thick liquids, intermittent Supervision  supervision with least restrictive diet  will have repeat MBSS to assess for possible upgrade to thin liquids   ADL's   mod to total A  min A overall   sitting balance, sit to stand, activity tolerance, cognition   Mobility   max-total A  min  A  activity tolerance, seated balance   Communication   Mod A  Min A-Supervision  auditory comprehension    Safety/Cognition/ Behavioral Observations  Max-Total A  Min A  attention, problem solving, working memory   Pain   Pt has occasional pain in back controlled with medication  <3      Skin   surgical incision to abdomen  free from infection         *See Interdisciplinary Assessment and Plan and progress notes for long and short-term goals  Barriers to Discharge:  profound neuro deficits    Possible Resolutions to Barriers:  supervsion and pain mgt, family ed    Discharge Planning/Teaching Needs:  Home with husband who will provide 24/7 assistance plus help from daughter, Andrey Campanile, who is retired.      Team Discussion: Episode w/ elevated HR & BP--meds adjusted.  Labs back indicating UTI-antibx.     Revisions to Treatment Plan: IV antibx d/t UTI  Continued Need for Acute Rehabilitation Level of Care: The patient requires daily medical management by a physician with specialized training in physical medicine and rehabilitation for the following conditions: Daily direction of a multidisciplinary physical rehabilitation program to ensure safe treatment while eliciting the highest outcome that is of practical value to the patient.: Yes Daily medical management of patient  stability for increased activity during participation in an intensive rehabilitation regime.: Yes Daily analysis of laboratory values and/or radiology reports with any subsequent need for medication adjustment of medical intervention for : Post surgical problems;Neurological problems;Other  Brock Ra 07/09/2011, 9:20 AM

## 2011-07-09 NOTE — Progress Notes (Signed)
Physical Therapy Note  Patient Details  Name: BREELLA VANOSTRAND MRN: [id number] Date of Birth: 1929/01/25 Today's Date: 07/09/2011  Time: [phone] 55 minutes  No c/o pain.  Treatment focused on seated and standing balance with emphasis on forward wt shift and wt bearing through LEs for sit to stand.  Seated balance edge of mat with reaching forward for objects with supervision, pt requires manual facilitation for forward wt shift to bear and sustain gentle weight on B LEs.  Standing frame x 8 mins, 3 mins with focus on pt activating gluts and trunk musculature for upright posture during reaching tasks.  Pt able to activate glutes and core but with decreased mm endurance has difficulty maintaining contraction.  Pt with overall improved activity tolerance but is severely limited by decreased muscular endurance.  Individual therapy  Delbert Darley 07/09/2011, 6:03 PM

## 2011-07-09 NOTE — Progress Notes (Signed)
Physical Therapy Weekly Progress Note  Patient Details  Name: Jean [name] MRN: [id number] Date of Birth: 1928-12-11  Today's Date: 07/09/2011  Patient has met 3 of 4 short term goals.  Pt has improved arousal and alertness helping to improve balance and functional mobility to mod-max A levels.  Pt still limited by posterior lean in standing making progress difficult with gait and standing balance training.  Patient continues to demonstrate the following deficits: decreased attention, decreased awareness, decreased activity tolerance, decreased balance,  delayed initiation, decreased motor planning, decreased strength and therefore will continue to benefit from skilled PT intervention to enhance overall performance with activity tolerance, balance, postural control, ability to compensate for deficits, attention, awareness and coordination.  Patient progressing toward long term goals..  Continue plan of care.  PT Short Term Goals Week 1:  PT Short Term Goal 1 (Week 1): pt will demonstrate sitting balance with B UE support with supervision x2 min consistently PT Short Term Goal 1 - Progress (Week 1): Met PT Short Term Goal 2 (Week 1): pt will perform med mobility with +1 max A PT Short Term Goal 2 - Progress (Week 1): Met PT Short Term Goal 3 (Week 1): pt will tolerate standing x1 min with mod A PT Short Term Goal 3 - Progress (Week 1): Progressing toward goal PT Short Term Goal 4 (Week 1): pt will perform bed-chair transfers with +1 max A PT Short Term Goal 4 - Progress (Week 1): Met Week 2:  PT Short Term Goal 1 (Week 2): Pt will consistantly transfer bed <> chair with min A PT Short Term Goal 2 (Week 2): Pt will gait 10' with max A PT Short Term Goal 3 (Week 2): Pt wil maintain static standing balance with mod A for functional activity PT Short Term Goal 4 (Week 2): Pt will sustain attention to functional activity x 2 mins with min A  Skilled Therapeutic Interventions/Progress Updates:    Warden/ranger;Ambulation/gait training;Cognitive remediation/compensation;DME/adaptive equipment instruction;Functional mobility training;Therapeutic Activities;UE/LE Coordination activities;Stair training;Wheelchair propulsion/positioning;UE/LE Strength taining/ROM;Therapeutic Exercise;Neuromuscular re-education;Pain management;Patient/family education     See FIM for current functional status   [name] 07/09/2011, 6:15 PM

## 2011-07-09 NOTE — Progress Notes (Signed)
Occupational Therapy Session Note  Patient Details  Name: Jean [name] MRN: [id number] Date of Birth: 10-Jul-1928  Today's Date: 07/09/2011 Time: 1300-1330 Time Calculation (min): 30 min  Short Term Goals: Week 1:  OT Short Term Goal 1 (Week 1): Pt will sit EOB for 3 min wtih min A in prep for ADL tasks OT Short Term Goal 2 (Week 1): Pt will demonstrate sustained attention with min A during funcitonal grooming tasks OT Short Term Goal 3 (Week 1): Pt will transfer with LRAD with max +1 for basic transfers OT Short Term Goal 4 (Week 1): Pt will don shirt with mod A in sitting   Skilled Therapeutic Interventions/Progress Updates:    1:1 focus on transfer training in prep for bed, chair and toilet transfers. Practiced squat pivot transfers without using the slide board; with extra time pt able to perform with max cuing mod A. Practiced sit to stand in prep for clothing management; needed max A with total tactile cuing for anterior pelvic tilt. At end of session pt transfer w/c to bed with min A with max A for sit to supine.   Therapy Documentation Precautions:  Precautions Precautions: Back;Fall Precaution Comments: required total A for recall of 3/3 precautions Required Braces or Orthoses: Spinal Brace Spinal Brace: Lumbar corset Restrictions Weight Bearing Restrictions: No Pain:  abdominal pain after therapy: unrated RN made aware.  See FIM for current functional status  Therapy/Group: Individual Therapy  Roney Mans [facility] 07/09/2011, 3:36 PM

## 2011-07-09 NOTE — Progress Notes (Signed)
Occupational Therapy Session Note  Patient Details  Name: MAILIN COGLIANESE MRN: [id number] Date of Birth: May 05, 1928  Today's Date: 07/09/2011 Time: 2130-8657 Time Calculation (min): 45 min  Short Term Goals: Week 1:  OT Short Term Goal 1 (Week 1): Pt will sit EOB for 3 min wtih min A in prep for ADL tasks OT Short Term Goal 2 (Week 1): Pt will demonstrate sustained attention with min A during funcitonal grooming tasks OT Short Term Goal 3 (Week 1): Pt will transfer with LRAD with max +1 for basic transfers OT Short Term Goal 4 (Week 1): Pt will don shirt with mod A in sitting   Skilled Therapeutic Interventions/Progress Updates:    1:1 self care retraining with focus on dressing and grooming only. Bed mobility with mod A to perform sidelying to sit at EOB, pt able to sit EOB with mod A with posterior lean, sit to stand with total A +2 with difficulty with upright posture, max A for transfers (stand pivot with +2- unable to turn around and step backwards to chair -needed total A to lower to chair. Max cuing during grooming for initiation and sustained attention to task .  Therapy Documentation Precautions:  Precautions Precautions: Back;Fall Precaution Comments: required total A for recall of 3/3 precautions Required Braces or Orthoses: Spinal Brace Spinal Brace: Lumbar corset Restrictions Weight Bearing Restrictions: No Pain:  unrated just reports she is sore.  See FIM for current functional status  Therapy/Group: Individual Therapy  Roney Mans [facility] 07/09/2011, 12:23 PM

## 2011-07-09 NOTE — Progress Notes (Signed)
Patient ID: Jean [name], female   DOB: 24-Sep-1928, 78 y.o.   MRN: [id number] Subjective/Complaints: Feeling better today. Much brighter. Still with low back pain Ros performed and otherwise negative  Objective: Vital Signs: Blood pressure 142/74, pulse 65, temperature 98.2 F (36.8 C), temperature source Oral, resp. rate 17, height 5\' 7"  (1.702 m), weight 70.1 kg (154 lb 8.7 oz), SpO2 91.00%. No results found.  Basename 07/06/11 0940  WBC 11.4*  HGB 11.4*  HCT 34.5*  PLT 353    Basename 07/09/11 0650 07/08/11 0632  NA 137 138  K 3.6 3.7  CL 98 98  CO2 29 30  GLUCOSE 106* 113*  BUN 29* 26*  CREATININE 1.72* 1.62*  CALCIUM 9.1 9.2   CBG (last 3)   Basename 07/08/11 0930  GLUCAP 182*    Wt Readings from Last 3 Encounters:  07/08/11 70.1 kg (154 lb 8.7 oz)  07/02/11 77.8 kg (171 lb 8.3 oz)  07/02/11 77.8 kg (171 lb 8.3 oz)    Physical Exam:  General appearance: alert, cooperative, appears stated age and no distress Head: Normocephalic, without obvious abnormality, atraumatic Eyes: conjunctivae/corneas clear. PERRL, EOM's intact.  Ears: normal external ear canals both ears Nose: Nares normal. Septum midline. Mucosa normal. No drainage or sinus tenderness. Throat: lips, mucosa, and tongue normal; teeth and gums normal Neck: no adenopathy, no carotid bruit, no JVD, supple, symmetrical, trachea midline and thyroid not enlarged, symmetric, no tenderness/mass/nodules Back: tender superior to incision. posture poor.  Resp: clear to auscultation bilaterally, no obvious wheezes or rales Cardio: regular rate and rhythm, S1, S2 normal, no murmur, click, rub or gallop GI: soft, non-tender; bowel sounds normal; no masses,  no organomegaly Extremities: extremities normal, atraumatic, no cyanosis or edema Pulses: 1+ edema lue Skin: Skin color, texture, turgor normal. No rashes or lesions Neurologic: delayed processing but better. Can feed herself with extra time and some cues.  Gross, generalized weakness--let more than right (inconsistent) no focal CN findings. Limited insight and awareness. 4/5 Strength BUE and BLE Incision/Wound: clean and intact   Assessment/Plan: 1. Functional deficits secondary to lumbar stenosis, radiculopathy, with post op watershed infarct which require 3+ hours per day of interdisciplinary therapy in a comprehensive inpatient rehab setting. Physiatrist is providing close team supervision and 24 hour management of active medical problems listed below. Physiatrist and rehab team continue to assess barriers to discharge/monitor patient progress toward functional and medical goals. FIM: FIM - Bathing Bathing: 0: Activity did not occur  FIM - Upper Body Dressing/Undressing Upper body dressing/undressing steps patient completed: Thread/unthread left sleeve of pullover shirt/dress;Put head through opening of pull over shirt/dress Upper body dressing/undressing: 4: Min-Patient completed 75 plus % of tasks FIM - Lower Body Dressing/Undressing Lower body dressing/undressing steps patient completed: Thread/unthread right pants leg;Thread/unthread left pants leg Lower body dressing/undressing: 1: Two helpers (+2 for sit to stand)  FIM - Toileting Toileting: 1: Two helpers  FIM - Archivist Transfers: 0-Activity did not occur  FIM - Architectural technologist Transfer: 3: Bed > Chair or W/C: Mod A (lift or lower assist);3: Chair or W/C > Bed: Mod A (lift or lower assist)  FIM - Locomotion: Wheelchair Locomotion: Wheelchair: 1: Travels less than 50 ft with moderate assistance (Pt: 50 - 74%) FIM - Locomotion: Ambulation Locomotion: Ambulation Assistive Devices: Designer, industrial/product Ambulation/Gait Assistance: 1: +2 Total assist Locomotion: Ambulation: 0: Activity did not occur  Comprehension Comprehension Mode: Auditory Comprehension: 2-Understands basic 25 -  49% of the  time/requires cueing 51 - 75% of the time  Expression Expression Mode: Verbal Expression: 1-Expresses basis less than 25% of the time/requires cueing greater than 75% of the time.  Social Interaction Social Interaction: 1-Interacts appropriately less than 25% of the time. May be withdrawn or combative.  Problem Solving Problem Solving: 1-Solves basic less than 25% of the time - needs direction nearly all the time or does not effectively solve problems and may need a restraint for safety  Memory Memory: 1-Recognizes or recalls less than 25% of the time/requires cueing greater than 75% of the time  1. Lumbar stenosis with radiculopathy. Status post bilateral laminotomies with decompression and removal of previous hardware. Complicated by acute arterial bleeding with vascular repair 06/23/2011  2. 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 Continue aspirin Plavix. Carotid Dopplers are pending  3. DVT Prophylaxis/Anticoagulation: SCDs. Monitor for any signs of deep vein thrombosis  4. Hypertension. aldomet 250 mg twice a day, Lopressor 150 mg twice a day. Monitor with increased mobility  5. Hypothyroidism. Synthroid  6. Mood/postoperative confusion. Presently on low dose Risperdal. Will continue to monitor. Speech therapy to followup for cognitive eval after recent stroke. Need to restore sleep wake cycle. Be careful with pain medications 7. FEN: mild renal insufficiency  -will push po fluids  -BUN and CRsligthtly elevated. No ivf yet   -watch for fluid overload given reduced CO. Holding lasix 8.  CHF exacerbation EF is 40-45%- lasix added with good results but will hold given above.   -monitor i's and o's as well as renal fxn  9. E COli UTI- sens to ctx. Day #2 of abx. Clinically much improved today. LOS (Days) 7 A FACE TO FACE EVALUATION WAS PERFORMED  Tishie Altmann T 07/09/2011, 8:59 AM

## 2011-07-09 NOTE — Progress Notes (Signed)
Speech Language Pathology Daily Session Note  Patient Details  Name: Jean [name] MRN: [id number] Date of Birth: [birth year]  Today's Date: 07/09/2011 Time: 1130-1230 Time Calculation (min): 60 min  Short Term Goals:  SLP Short Term Goal 1 (Week 1): Pt will demonstrate sustained attention to a functional task for 3 mins with Mod A verbal and semantic cues for redirection.  SLP Short Term Goal 2 (Week 1): Pt will utilize visual aids (schedule, etc.) to recall daily informaton with Mod verbal and semantic cues.  SLP Short Term Goal 3 (Week 1): Pt will initiaite functional tasks with Mod A semantic and visual cues. SLP Short Term Goal 4 (Week 1): Pt will identify 1 physical and 1 cognitive deficit with Mod A verbal and semantic cues.  SLP Short Term Goal 5 (Week 1): Pt will utilize swallowing compensatory strategies with supervision verbal cues.   Skilled Therapeutic Interventions: Treatment focus on utilization of swallowing compensatory strategies with Dys. 3 textures and nectar thick liquids. Pt required Mod A verbal cues for initiation of self-feeding and encouragement for PO intake. Pt also required Max A verbal and visual cues for sustained attention to meal. Pt with throat clearing X 2 during meal.    Daily Session FIM:  Comprehension Comprehension Mode: Auditory Comprehension: 3-Understands basic 50 - 74% of the time/requires cueing 25 - 50%  of the time Expression Expression: 2-Expresses basic 25 - 49% of the time/requires cueing 50 - 75% of the time. Uses single words/gestures. Social Interaction Social Interaction: 3-Interacts appropriately 50 - 74% of the time - May be physically or verbally inappropriate. Problem Solving Problem Solving: 2-Solves basic 25 - 49% of the time - needs direction more than half the time to initiate, plan or complete simple activities Memory Memory: 1-Recognizes or recalls less than 25% of the time/requires cueing greater than 75% of the time FIM -  Eating Eating Activity: 5: Supervision/cues Pain Pain Assessment Pain Assessment: No/denies pain  Therapy/Group: Individual Therapy  Maryl Blalock 07/09/2011, 5:06 PM

## 2011-07-10 LAB — BASIC METABOLIC PANEL
Chloride: 100 mEq/L (ref 96–112)
GFR calc Af Amer: 35 mL/min — ABNORMAL LOW (ref >90)
Potassium: 3.8 mEq/L (ref 3.5–5.1)
Sodium: 139 mEq/L (ref 135–145)

## 2011-07-10 MED ORDER — CEPHALEXIN 250 MG PO CAPS
250.0000 mg | ORAL_CAPSULE | Freq: Four times a day (QID) | ORAL | Status: DC
  Administered 2011-07-10 – 2011-07-19 (×36): 250 mg via ORAL
  Filled 2011-07-10 (×40): qty 1

## 2011-07-10 NOTE — Progress Notes (Signed)
Occupational Therapy Session Note  Patient Details  Name: Jean [name] MRN: [id number] Date of Birth: [birth year]  Today's Date: 07/10/2011 Time: 1345-1430 Time Calculation (min): 45 min  Short Term Goals: Week 1:  OT Short Term Goal 1 (Week 1): Pt will sit EOB for 3 min wtih min A in prep for ADL tasks OT Short Term Goal 2 (Week 1): Pt will demonstrate sustained attention with min A during funcitonal grooming tasks OT Short Term Goal 3 (Week 1): Pt will transfer with LRAD with max +1 for basic transfers OT Short Term Goal 4 (Week 1): Pt will don shirt with mod A in sitting   Skilled Therapeutic Interventions/Progress Updates:    1:1 therapeutic activity: focus on activity tolerance, standing tolerance, small postural adjustments for upright posture over feet while standing in standing frame. Pt needed to take 3 rest breaks. While standing engaged in putting together paper mosaic following one step directions with max cuing to follow out command in a minimal distracting environment, working memory with total A (to recall where glue was to put her mosaic tile.).  Therapy Documentation Precautions:  Precautions Precautions: Back;Fall Precaution Comments: required total A for recall of 3/3 precautions Required Braces or Orthoses: Spinal Brace Spinal Brace: Lumbar corset Restrictions Weight Bearing Restrictions: No Pain:  no c/o pain just fatigue  See FIM for current functional status  Therapy/Group: Individual Therapy  Roney Mans [facility] 07/10/2011, 3:01 PM

## 2011-07-10 NOTE — Progress Notes (Signed)
Physical Therapy Note  Patient Details  Name: Jean [name] MRN: [id number] Date of Birth: [birth year] Today's Date: 07/10/2011  9:15-10:15 individual therapy pt denied pain until standing then only complained of incision pain on abdomen. No pain at rest.  Pt oriented to person, place, and situation today.  Sit to stand to rw 2 person assist pt = 50 %. Pt did better with 2 hands on the walker for now. X 3 and x 1 pushing from wc. Unable to stand pushing from chair. Gait x 3' with rw 2 person assist pt =30%. Stand pivot wc to bed very unsafe at the end of the session, attempting to sit before bed.  Sit to side max assist with tactile cues for technique, LAQ with 3# weight x 20 each.   Julian Reil 07/10/2011, 10:11 AM

## 2011-07-10 NOTE — Progress Notes (Signed)
Social Work Patient ID: Jean [name], female   DOB: [birth year], 77 y.o.   MRN: [id number]    Touching base this morning with pt and husband - both aware and agreeable with target d/c 5/22.  Husband understands that we will want to complete family ed with both him and his daughter prior to d/c.  Both deny any concerns at this point - will contiinue to follow.  Tyree Vandruff

## 2011-07-10 NOTE — Progress Notes (Signed)
Occupational Therapy Session Note  Patient Details  Name: Jean [name] MRN: [id number] Date of Birth: 20-Aug-1928  Today's Date: 07/10/2011 Time: 7829-5621 Time Calculation (min): 45 min  Short Term Goals: Week 1:  OT Short Term Goal 1 (Week 1): Pt will sit EOB for 3 min wtih min A in prep for ADL tasks OT Short Term Goal 2 (Week 1): Pt will demonstrate sustained attention with min A during funcitonal grooming tasks OT Short Term Goal 3 (Week 1): Pt will transfer with LRAD with max +1 for basic transfers OT Short Term Goal 4 (Week 1): Pt will don shirt with mod A in sitting   Skilled Therapeutic Interventions/Progress Updates:    1:1 self care retraining with focus on dressing and grooming only. Focus on bed mobility to get to EOB with improved transitional movements, sitting balance at EOB, sustained attention with mod cuing (contexual/ environmental), threading pants without use of reacher while still maintain back prec (able to perform better than with a reacher), sit to stand with 2nd person assisting with pants management., unable to maintain standing for 1 min. Scoot transfer bed to w/c with mod A with max tactile, verbal cues  Therapy Documentation Precautions:  Precautions Precautions: Back;Fall Precaution Comments: required total A for recall of 3/3 precautions Required Braces or Orthoses: Spinal Brace Spinal Brace: Lumbar corset Restrictions Weight Bearing Restrictions: No Pain:  no c/o pain  See FIM for current functional status  Therapy/Group: Individual Therapy  Roney Mans [facility] 07/10/2011, 10:44 AM

## 2011-07-10 NOTE — Progress Notes (Signed)
Speech Language Pathology Daily Session Note  Patient Details  Name: Jean [name] MRN: [id number] Date of Birth: 04-01-1928  Today's Date: 07/10/2011 Time: 1100-1200 Time Calculation (min): 60 min  Short Term Goals:  SLP Short Term Goal 1 (Week 1): Pt will demonstrate sustained attention to a functional task for 3 mins with Mod A verbal and semantic cues for redirection.  SLP Short Term Goal 2 (Week 1): Pt will utilize visual aids (schedule, etc.) to recall daily informaton with Mod verbal and semantic cues.  SLP Short Term Goal 3 (Week 1): Pt will initiaite functional tasks with Mod A semantic and visual cues. SLP Short Term Goal 4 (Week 1): Pt will identify 1 physical and 1 cognitive deficit with Mod A verbal and semantic cues.  SLP Short Term Goal 5 (Week 1): Pt will utilize swallowing compensatory strategies with supervision verbal cues.   Skilled Therapeutic Interventions: Treatment focus on initiation, problem solving and utilization of swallowing compensatory strategies. Pt required extra time and Max verbal and visual cues for arousal and initiation to scoot EOB for transfer to chair. Pt also required Max A verbal and tactile cues for sequencing and problem solving with sliding board transfer. Pt consumed dys. 3 textures and nectar-thick liquids. Pt required supervision verbal and visual cues to intitiate meal and to utilize swallowing compensatory strategies for alternating solids and liquids. Pt with prolonged mastication of dry meat which she had to eventually expel from oral cavity. Sustained attention to meal with Min verbal cues.    Daily Session FIM:  Comprehension Comprehension Mode: Auditory Comprehension: 2-Understands basic 25 - 49% of the time/requires cueing 51 - 75% of the time Expression Expression Mode: Verbal Expression: 2-Expresses basic 25 - 49% of the time/requires cueing 50 - 75% of the time. Uses single words/gestures. Social Interaction Social Interaction:  3-Interacts appropriately 50 - 74% of the time - May be physically or verbally inappropriate. Problem Solving Problem Solving: 2-Solves basic 25 - 49% of the time - needs direction more than half the time to initiate, plan or complete simple activities Memory Memory: 2-Recognizes or recalls 25 - 49% of the time/requires cueing 51 - 75% of the time FIM - Eating Eating Activity: 5: Supervision/cues  Pain: No/Denies Pain  Therapy/Group: Individual Therapy  Talyah Seder 07/10/2011, 1:44 PM

## 2011-07-10 NOTE — Care Management (Signed)
Per State Regulation 482.30 This chart was reviewed for medical necessity with respect to the patient's Admission/Duration of stay. Pt participating & progressing in txs. She is incontinent of bowel at this time w/ bladder managed by foley.  As she is more mobile, may be able to d/c foley soon.  Will need scheduled toileting.  Antibx changed to po.   Brock Ra                 Nurse Care Manager              Next Review Date: 07/14/11

## 2011-07-10 NOTE — Progress Notes (Signed)
Recreational Therapy Session Note  Patient Details  Name: Jean [name] MRN: [id number] Date of Birth: February 10, 1929 Today's Date: 07/10/2011 Time: 4696-2952 Pain: no c/o Skilled Therapeutic Interventions/Progress Updates: stood in standing frame for simple tabletop craft with Max instructional cues to complete task using UE's for fine/gross motor tasks. Therapy/Group: Co-Treatment   [name] 07/10/2011, 2:50 PM

## 2011-07-10 NOTE — Progress Notes (Signed)
Patient ID: Jean [name], female   DOB: 12-Feb-1929, 78 y.o.   MRN: [id number] Patient ID: Jean [name], female   DOB: 12-31-1928, 78 y.o.   MRN: [id number] Subjective/Complaints: Tired from am therapy. Walked in gym with her walker Ros performed and otherwise negative  Objective: Vital Signs: Blood pressure 165/83, pulse 92, temperature 97.3 F (36.3 C), temperature source Oral, resp. rate 19, height 5\' 7"  (1.702 m), weight 67 kg (147 lb 11.3 oz), SpO2 92.00%. No results found. No results found for this basename: WBC:2,HGB:2,HCT:2,PLT:2 in the last 72 hours  Basename 07/10/11 0630 07/09/11 0650  NA 139 137  K 3.8 3.6  CL 100 98  CO2 28 29  GLUCOSE 100* 106*  BUN 30* 29*  CREATININE 1.55* 1.72*  CALCIUM 9.1 9.1   CBG (last 3)   Basename 07/08/11 0930  GLUCAP 182*    Wt Readings from Last 3 Encounters:  07/09/11 67 kg (147 lb 11.3 oz)  07/02/11 77.8 kg (171 lb 8.3 oz)  07/02/11 77.8 kg (171 lb 8.3 oz)    Physical Exam:  General appearance: alert, cooperative, appears stated age and no distress Head: Normocephalic, without obvious abnormality, atraumatic Eyes: conjunctivae/corneas clear. PERRL, EOM's intact.  Ears: normal external ear canals both ears Nose: Nares normal. Septum midline. Mucosa normal. No drainage or sinus tenderness. Throat: lips, mucosa, and tongue normal; teeth and gums normal Neck: no adenopathy, no carotid bruit, no JVD, supple, symmetrical, trachea midline and thyroid not enlarged, symmetric, no tenderness/mass/nodules Back: tender superior to incision. posture poor.  Resp: clear to auscultation bilaterally, no obvious wheezes or rales Cardio: regular rate and rhythm, S1, S2 normal, no murmur, click, rub or gallop GI: soft, non-tender; bowel sounds normal; no masses,  no organomegaly Extremities: extremities normal, atraumatic, no cyanosis or edema Pulses: 1+ edema lue Skin: Skin color, texture, turgor normal. No rashes or lesions Neurologic:  fairly alert. Gross, generalized weakness--let more than right (inconsistent) no focal CN findings. Limited insight and awareness. 4/5 Strength BUE and BLE Incision/Wound: clean and intact and essentially closed   Assessment/Plan: 1. Functional deficits secondary to lumbar stenosis, radiculopathy, with post op watershed infarct which require 3+ hours per day of interdisciplinary therapy in a comprehensive inpatient rehab setting. Physiatrist is providing close team supervision and 24 hour management of active medical problems listed below. Physiatrist and rehab team continue to assess barriers to discharge/monitor patient progress toward functional and medical goals. FIM: FIM - Bathing Bathing: 0: Activity did not occur  FIM - Upper Body Dressing/Undressing Upper body dressing/undressing steps patient completed: Thread/unthread left sleeve of pullover shirt/dress;Put head through opening of pull over shirt/dress Upper body dressing/undressing: 4: Min-Patient completed 75 plus % of tasks FIM - Lower Body Dressing/Undressing Lower body dressing/undressing steps patient completed: Thread/unthread right pants leg;Thread/unthread left pants leg Lower body dressing/undressing: 1: Two helpers (+2 for sit to stand)  FIM - Toileting Toileting: 1: Two helpers  FIM - Archivist Transfers: 0-Activity did not occur  FIM - Architectural technologist Transfer: 3: Bed > Chair or W/C: Mod A (lift or lower assist);3: Chair or W/C > Bed: Mod A (lift or lower assist)  FIM - Locomotion: Wheelchair Locomotion: Wheelchair: 1: Total Assistance/staff pushes wheelchair (Pt<25%) FIM - Locomotion: Ambulation Locomotion: Ambulation Assistive Devices: Designer, industrial/product Ambulation/Gait Assistance: 1: +2 Total assist Locomotion: Ambulation: 0: Activity did not occur  Comprehension Comprehension Mode: Auditory Comprehension: 2-Understands basic  25 - 49% of the time/requires  cueing 51 - 75% of the time  Expression Expression Mode: Verbal Expression: 2-Expresses basic 25 - 49% of the time/requires cueing 50 - 75% of the time. Uses single words/gestures.  Social Interaction Social Interaction: 3-Interacts appropriately 50 - 74% of the time - May be physically or verbally inappropriate.  Problem Solving Problem Solving: 2-Solves basic 25 - 49% of the time - needs direction more than half the time to initiate, plan or complete simple activities  Memory Memory: 1-Recognizes or recalls less than 25% of the time/requires cueing greater than 75% of the time  1. Lumbar stenosis with radiculopathy. Status post bilateral laminotomies with decompression and removal of previous hardware. Complicated by acute arterial bleeding with vascular repair 06/23/2011  2. 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 Continue aspirin Plavix. Carotid Dopplers are pending  3. DVT Prophylaxis/Anticoagulation: SCDs. Monitor for any signs of deep vein thrombosis  4. Hypertension. aldomet 250 mg twice a day, Lopressor 150 mg twice a day. bp boderline at times. 5. Hypothyroidism. Synthroid  6. Mood/postoperative confusion. Presently on low dose Risperdal--wonder if we can dc this? 7. FEN: mild renal insufficiency  -push po fluids  -BUN and CR stable to improved  -watch for fluid overload given reduced CO. Holding lasix 8.  CHF exacerbation EF is 40-45%- lasix added with good results but held   -monitor i's and o's as well as renal fxn  9. E COli UTI- sens to ctx. Day #2 of abx. Clinically improved. Change to keflex today LOS (Days) 8 A FACE TO FACE EVALUATION WAS PERFORMED  Lianni Kanaan T 07/10/2011, 10:10 AM

## 2011-07-11 DIAGNOSIS — Z5189 Encounter for other specified aftercare: Secondary | ICD-10-CM

## 2011-07-11 DIAGNOSIS — I6330 Cerebral infarction due to thrombosis of unspecified cerebral artery: Secondary | ICD-10-CM

## 2011-07-11 DIAGNOSIS — M4716 Other spondylosis with myelopathy, lumbar region: Secondary | ICD-10-CM

## 2011-07-11 MED ORDER — MEGESTROL ACETATE 400 MG/10ML PO SUSP
400.0000 mg | Freq: Every day | ORAL | Status: DC
  Administered 2011-07-11 – 2011-07-14 (×4): 400 mg via ORAL
  Filled 2011-07-11 (×5): qty 10

## 2011-07-11 NOTE — Progress Notes (Signed)
Physical Therapy Session Note  Patient Details  Name: Jean [name] MRN: [id number] Date of Birth: [birth year]  Today's Date: 07/11/2011 Time: 4098-1191 Time Calculation (min): 25 min  Short Term Goals: Week 2:  PT Short Term Goal 1 (Week 2): Pt will consistantly transfer bed <> chair with min A PT Short Term Goal 2 (Week 2): Pt will gait 10' with max A PT Short Term Goal 3 (Week 2): Pt wil maintain static standing balance with mod A for functional activity PT Short Term Goal 4 (Week 2): Pt will sustain attention to functional activity x 2 mins with min A  Skilled Therapeutic Interventions/Progress Updates:   Squat pivot transfer w/c to mat with mod@.  Sit to stands for LE strengthening and to retrain the correct technique to perform with mod@ with chair in front of for balance.  Pt would stand less than one minute before retuning to sit, performed multiple time.  Gait training with RW x 5', total@+2, pt = 60%, pt very fearful because she said it felt like her knee was buckling.  Pt tending to lean posteriorly and getting RW too far in front of her.  Therapy Documentation Precautions:  Precautions Precautions: Fall;Back Precaution Comments: required total A for recall of 3/3 precautions Required Braces or Orthoses: Spinal Brace Spinal Brace: Lumbar corset Restrictions Weight Bearing Restrictions: No Pain:  No pain reported  See FIM for current functional status  Therapy/Group: Individual Therapy  Georges Mouse 07/11/2011, 4:05 PM

## 2011-07-11 NOTE — Progress Notes (Signed)
Occupational Therapy Session Note  Patient Details  Name: Jean [name] MRN: [id number] Date of Birth: 04-Oct-1928  Today's Date: 07/11/2011 Time: 8469-6295 Time Calculation (min): 45 min  Short Term Goals: Week 1:  OT Short Term Goal 1 (Week 1): Pt will sit EOB for 3 min wtih min A in prep for ADL tasks OT Short Term Goal 2 (Week 1): Pt will demonstrate sustained attention with min A during funcitonal grooming tasks OT Short Term Goal 3 (Week 1): Pt will transfer with LRAD with max +1 for basic transfers OT Short Term Goal 4 (Week 1): Pt will don shirt with mod A in sitting   Skilled Therapeutic Interventions/Progress Updates:    1:1 self care retraining : dressing and grooming.  Bed mobility with min A to come to EOB, transfer scoot pivot with mod A bed to drop arm commode where she was able to void of BM, pt able to to try to perform hygien (in sitting) with min A to steady her, sit to stand with increased upright posture for 1 min for 2nd person to finsih hygiene and don brief, with 2nd and 3rd sit to stand unable to ahcieve  A full upright posture with increase fatigue. Increased ability with threading pants in sitting with out reacher with min A for each LE strength. Pt still required max to total cuing through out session to complete tasks but did demonstrate increased working memory with prompts and extra time.  Therapy Documentation Precautions:  Precautions Precautions: Back;Fall Precaution Comments: required total A for recall of 3/3 precautions Required Braces or Orthoses: Spinal Brace Spinal Brace: Lumbar corset Restrictions Weight Bearing Restrictions: No Pain:  no c/o pain  See FIM for current functional status  Therapy/Group: Individual Therapy  Roney Mans [facility] 07/11/2011, 10:29 AM

## 2011-07-11 NOTE — Progress Notes (Signed)
Occupational Therapy Weekly Progress Note  Patient Details  Name: Jean [name] MRN: [id number] Date of Birth: 02-Sep-1928  Today's Date: 07/11/2011 Time: 4098-1191 Time Calculation (min): 40 min  Patient has met 3 of 4 short term goals. Pt has made good progress since evaluation with activity tolerance/ endurance, focused to sustained attention, orientation, recall of back precautions with prompting, sitting balance, ability to transfer with scoot pivots and participate in basic and familiar tasks. OT has been concentrating on dressing, grooming and toilet tasks.  Bathing has been completed at night time due to her fatigue and attention deficits.  Pt can perform basic transfers with mod A and basic mobility with min to max A, UB bathing with min A, LB dressing with total A and total +2 for sit to stands.  Patient continues to demonstrate the following deficits: muscle weakness, decreased cardiorespiratoy endurance, impaired timing and sequencing, unbalanced muscle activation, decreased coordination and decreased motor planning, decreased motor planning, decreased initiation, decreased attention, decreased awareness, decreased problem solving, decreased safety awareness, decreased memory and delayed processing and decreased sitting balance, decreased standing balance, decreased postural control, decreased balance strategies and difficulty maintaining precautions and therefore will continue to benefit from skilled OT intervention to enhance overall performance with BADL.  Patient progressing toward long term goals..  Continue plan of care.  OT Short Term Goals Week 1:  OT Short Term Goal 1 (Week 1): Pt will sit EOB for 3 min wtih min A in prep for ADL tasks OT Short Term Goal 1 - Progress (Week 1): Met OT Short Term Goal 2 (Week 1): Pt will demonstrate sustained attention with min A during funcitonal grooming tasks OT Short Term Goal 2 - Progress (Week 1): Not met OT Short Term Goal 3 (Week 1): Pt  will transfer with LRAD with max +1 for basic transfers OT Short Term Goal 3 - Progress (Week 1): Met OT Short Term Goal 4 (Week 1): Pt will don shirt with mod A in sitting  OT Short Term Goal 4 - Progress (Week 1): Met Week 2:  OT Short Term Goal 1 (Week 2): Pt will demonstrate sustained attention for 3 min with mod A in a familar functional task OT Short Term Goal 2 (Week 2): Pt will don shirt with steadying A in sitting EOB OT Short Term Goal 3 (Week 2): Pt will transfer to toilet with min A OT Short Term Goal 4 (Week 2): Pt will perform sit to stand for clothing managment with max +1 OT Short Term Goal 5 (Week 2): Pt demonstrate intellecual awareness with mod A in a functional task  Skilled Therapeutic Interventions/Progress Updates:    1:1 pm session: therapeutic activity: focusing on sit to stand and standing balance with, trunk/core control in SARA with walking sling for increased support in prep for sit to stands and standing balance in basic ADL tasks (dressing and toileting). Pt able to maintain an upright posture for 3 min at a time while engaging in conversation about outside while looking out the window. Pt even able to take a couple steps forward automatically while still maintaining an upright posture and not trying to sit in sling. Pt oriented x3 with mod prompting cuing for situation  Therapy Documentation Precautions:  Precautions Precautions: Fall;Back Precaution Comments: required total A for recall of 3/3 precautions Required Braces or Orthoses: Spinal Brace Spinal Brace: Lumbar corset Restrictions Weight Bearing Restrictions: No Pain:  no c/o ADL: ADL Eating: Set up Grooming: Minimal assistance Where Assessed-Grooming: Sitting  at sink Upper Body Bathing: Minimal assistance Lower Body Bathing: Dependent;Maximal assistance Where Assessed-Lower Body Bathing: Sitting at sink;Standing at sink Upper Body Dressing: Minimal assistance Where Assessed-Upper Body Dressing:  Edge of bed Lower Body Dressing: Dependent Where Assessed-Lower Body Dressing: Sitting at sink;Standing at sink Toileting: Dependent Where Assessed-Toileting: Bedside Commode Toilet Transfer: Moderate assistance Toilet Transfer Method: Squat pivot Toilet Transfer Equipment: Drop arm bedside commode    See FIM for current functional status  Therapy/Group: Individual Therapy  Roney Mans [facility] 07/11/2011, 4:04 PM

## 2011-07-11 NOTE — Progress Notes (Signed)
Speech Language Pathology Weekly Progress & Session Notes  Patient Details  Name: Jean [name] MRN: [id number] Date of Birth: Jan 15, 1929  Today's Date: 07/11/2011 Time: 4098-1191 Time Calculation (min): 45 min  Short Term Goals: : SLP Short Term Goal 1 (Week 1): Pt will demonstrate sustained attention to a functional task for 3 mins with Mod A verbal and semantic cues for redirection.  SLP Short Term Goal 1 - Progress (Week 1): Not met SLP Short Term Goal 2 (Week 1): Pt will utilize visual aids (schedule, etc.) to recall daily informaton with Mod verbal and semantic cues.  SLP Short Term Goal 2 - Progress (Week 1): Not met SLP Short Term Goal 3 (Week 1): Pt will initiaite functional tasks with Mod A semantic and visual cues. SLP Short Term Goal 3 - Progress (Week 1): Met SLP Short Term Goal 4 (Week 1): Pt will identify 1 physical and 1 cognitive deficit with Mod A verbal and semantic cues.  SLP Short Term Goal 4 - Progress (Week 4): Not met SLP Short Term Goal 5 (Week 1): Pt will utilize swallowing compensatory strategies with supervision verbal cues.  SLP Short Term Goal 5 - Progress (Week 1): Met  New Short Term Goals:  SLP Short Term Goal 1 (Week 2): Pt will consume trials of thin liquids without overt s/s of aspiration with supervision. SLP Short Term Goal 2 (Week 2): Pt will identify 1 physical and 1 cognitive deficit with Mod A verbal and semantic cues. SLP Short Term Goal 3 (Week 2): Pt will initiate functional tasks with Min A verbal and visual cues. SLP Short Term Goal 4 (Week 2): Pt will utilize visual aids  (schedule, calendar, etc.) to recall daily information with mod A semantic and verbal cues.  SLP Short Term Goal 5 (Week 2): Pt will sustain attention to a functional task for 2 mins with Max A verbal and tactile cues.  Weekly Progress Updates: Pt has made some functional gains and has met 2 out of 5 STG's this reporting period. Currently, pt is consuming Dys. 3 textures  and nectar thick liquids with intermittent supervision and no overt s/s of aspiration. Pt will have repeat MBSS this week. Pt is also currently overall Max A with cognitive function with basic tasks in regards to sustained attention, initiation, working memory, problem solving, intellectual awareness, orientation and delayed processing. Pt's cognitive function also impacts verbal expression of wants/needs. Pt would benefit from continued skilled SLP intervention to maximize swallowing function with least restrictive diet, functional communication and maxmize cognitive function to increase overall independence and decreased burden of care for discharge home.   Daily Session Skilled Therapeutic Intervention: Treatment focus on sustained attention, initiation and problem solving. Pt reporting lethargy and required Max A verbal, tactile and visual cues to attend to sorting task for ~30 seconds. Pt also required Total A for functional problem solving with use of verbal, visual, demonstration and semantic cues.  Precautions/Restrictions  Precautions Precautions: Fall;Back Required Braces or Orthoses: Spinal Brace Spinal Brace: Lumbar corset Restrictions Weight Bearing Restrictions: No FIM:  Comprehension Comprehension: 2-Understands basic 25 - 49% of the time/requires cueing 51 - 75% of the time Expression Expression: 2-Expresses basic 25 - 49% of the time/requires cueing 50 - 75% of the time. Uses single words/gestures. Social Interaction Social Interaction: 3-Interacts appropriately 50 - 74% of the time - May be physically or verbally inappropriate. Problem Solving Problem Solving: 1-Solves basic less than 25% of the time - needs direction nearly all the  time or does not effectively solve problems and may need a restraint for safety Memory Memory: 1-Recognizes or recalls less than 25% of the time/requires cueing greater than 75% of the time Pain No/denies pain Cognition: Overall Cognitive Status:  Impaired Arousal/Alertness: Awake/alert Orientation Level: Oriented to person;Oriented to place;Other (comment) (inconsistent at times, needs cueing) Attention: Focused Focused Attention: Appears intact Sustained Attention: Impaired Sustained Attention Impairment: Verbal basic;Functional basic Selective Attention: Impaired Selective Attention Impairment: Verbal basic;Functional basic Memory: Impaired Memory Impairment: Decreased short term memory;Decreased recall of new information;Storage deficit Decreased Long Term Memory: Verbal basic;Functional basic Decreased Short Term Memory: Verbal basic;Functional basic Awareness: Impaired Awareness Impairment: Intellectual impairment Problem Solving: Impaired Problem Solving Impairment: Verbal basic;Functional basic Executive Function: Reasoning;Sequencing;Organizing;Initiating Reasoning: Impaired Reasoning Impairment: Verbal basic;Functional basic Sequencing: Impaired Sequencing Impairment: Verbal basic;Functional basic Organizing: Impaired Organizing Impairment: Verbal basic;Functional basic Decision Making: Impaired Decision Making Impairment: Verbal basic;Functional basic Initiating: Impaired Initiating Impairment: Verbal basic;Functional basic Self Correcting: Impaired Self Correcting Impairment: Verbal basic;Functional basic Safety/Judgment: Impaired Oral/Motor: Oral Motor/Sensory Function Overall Oral Motor/Sensory Function: Appears within functional limits for tasks assessed Motor Speech Overall Motor Speech: Appears within functional limits for tasks assessed Comprehension: Auditory Comprehension Overall Auditory Comprehension: Impaired Yes/No Questions: Impaired Complex Questions: 75-100% accurate Commands: Impaired Two Step Basic Commands: 50-74% accurate (impacted by attention) Multistep Basic Commands: 25-49% accurate (impacted by attention) Conversation: Simple Other Conversation Comments: Comprehension is impacted  by pt's impaired sustained attention, decreased processing speed and impaired working memory.  Interfering Components: Attention;Pain;Processing speed;Working Radio broadcast assistant: Wellsite geologist: Within [employer] Reading Comprehension Reading Status: Impaired Functional Environmental (signs, name badge): Within functional limits Interfering Components: Attention;Working Dance movement psychotherapist: Verbal cueing;Visual cueing Expression: Expression Primary Mode of Expression: Verbal Verbal Expression Overall Verbal Expression: Impaired Initiation: Impaired Automatic Speech: Name;Social Response Level of Generative/Spontaneous Verbalization: Sentence Repetition: No impairment Naming: No impairment Pragmatics: Impairment Impairments: Topic maintenance Interfering Components: Attention Effective Techniques: Semantic cues Other Verbal Expression Comments: Pt's verbal expression impacted, working memory, and impaired thought organization.  Written Expression Dominant Hand: Right Written Expression: Not tested  Therapy/Group: Individual Therapy  Ygnacio Fecteau 07/11/2011, 4:39 PM

## 2011-07-11 NOTE — Progress Notes (Signed)
Physical Therapy Session Note  Patient Details  Name: Jean [name] MRN: [id number] Date of Birth: 1928/09/05  Today's Date: 07/11/2011 Time: 1022-1115 Time Calculation (min): 53 min  Short Term Goals: Week 2:  PT Short Term Goal 1 (Week 2): Pt will consistantly transfer bed <> chair with min A PT Short Term Goal 2 (Week 2): Pt will gait 10' with max A PT Short Term Goal 3 (Week 2): Pt wil maintain static standing balance with mod A for functional activity PT Short Term Goal 4 (Week 2): Pt will sustain attention to functional activity x 2 mins with min A  Skilled Therapeutic Interventions/Progress Updates:    Session focused on bed mobility, standing tolerance and gait.  The patient needs near constant verbal cues (one step is best) for tasks (even familiar tasks like scooting or standing up).  Sit to stand using the steady max assist to get up to the steady and then min assist for sit to stand 7 reps from steady.  While in steady worked on standing tolerance.  Max of 3 trials 1 min 36 seconds before fatigue.  Verbal cues for upright posture and to encourage standing longer.  Stand pivot with RW 2 person max assist WC<--> mat table.  Practiced bed mobility on mat table x 2 mod assist (lifting both legs to get in and out and support of trunk with transition to go to sidelying and back to sitting.  Practiced rolling over in bed using log roll technique bil mod assist. Gait x4 trials with seated rest breaks between (< 2 mins) with RW 2 person moderate assit.  Patient with difficulty weight shifting onto her right leg to progress her left foot forward (needed manual assist to do both).  Patient reports this is due to the fact that her right leg is weaker and that knee buckles.  WC propulsion x 300' patient attempting to help with arms only, but total assist< 25%.    Therapy Documentation Precautions:  Precautions Precautions: Back;Fall Precaution Comments: required total A for recall of 3/3  precautions Required Braces or Orthoses: Spinal Brace Spinal Brace: Lumbar corset Restrictions Weight Bearing Restrictions: No Pain: Pain Assessment Pain Assessment: No/denies pain Pain Score: 0-No pain Locomotion : Wheelchair Mobility Distance: 300   See FIM for current functional status  Therapy/Group: Individual Therapy  Lurena Joiner B. Lille Karim, PT, DPT [phone] 07/11/2011, 12:37 PM

## 2011-07-11 NOTE — Progress Notes (Addendum)
Subjective/Complaints: Appetite better today. Tolerating therapies Ros performed and otherwise negative  Objective: Vital Signs: Blood pressure 121/70, pulse 68, temperature 98.1 F (36.7 C), temperature source Oral, resp. rate 18, height 5\' 7"  (1.702 m), weight 67 kg (147 lb 11.3 oz), SpO2 94.00%. No results found. No results found for this basename: WBC:2,HGB:2,HCT:2,PLT:2 in the last 72 hours  Basename 07/10/11 0630 07/09/11 0650  NA 139 137  K 3.8 3.6  CL 100 98  CO2 28 29  GLUCOSE 100* 106*  BUN 30* 29*  CREATININE 1.55* 1.72*  CALCIUM 9.1 9.1   CBG (last 3)   Basename 07/08/11 0930  GLUCAP 182*    Wt Readings from Last 3 Encounters:  07/09/11 67 kg (147 lb 11.3 oz)  07/02/11 77.8 kg (171 lb 8.3 oz)  07/02/11 77.8 kg (171 lb 8.3 oz)    Physical Exam:  General appearance: alert, cooperative, appears stated age and no distress Head: Normocephalic, without obvious abnormality, atraumatic Eyes: conjunctivae/corneas clear. PERRL, EOM's intact.  Ears: normal external ear canals both ears Nose: Nares normal. Septum midline. Mucosa normal. No drainage or sinus tenderness. Throat: lips, mucosa, and tongue normal; teeth and gums normal Neck: no adenopathy, no carotid bruit, no JVD, supple, symmetrical, trachea midline and thyroid not enlarged, symmetric, no tenderness/mass/nodules Back: tender superior to incision. posture poor.  Resp: clear to auscultation bilaterally, no obvious wheezes or rales Cardio: regular rate and rhythm, S1, S2 normal, no murmur, click, rub or gallop GI: soft, non-tender; bowel sounds normal; no masses,  no organomegaly Extremities: extremities normal, atraumatic, no cyanosis or edema Pulses: 1+ edema lue Skin: Skin color, texture, turgor normal. No rashes or lesions Neurologic: fairly alert. Gross, generalized weakness--let more than right (inconsistent) no focal CN findings. Limited insight and awareness. 4/5 Strength BUE and BLE Incision/Wound:  clean and intact and essentially closed   Assessment/Plan: 1. Functional deficits secondary to lumbar stenosis, radiculopathy, with post op watershed infarct which require 3+ hours per day of interdisciplinary therapy in a comprehensive inpatient rehab setting. Physiatrist is providing close team supervision and 24 hour management of active medical problems listed below. Physiatrist and rehab team continue to assess barriers to discharge/monitor patient progress toward functional and medical goals. FIM: FIM - Bathing Bathing: 0: Activity did not occur  FIM - Upper Body Dressing/Undressing Upper body dressing/undressing steps patient completed: Thread/unthread right sleeve of pullover shirt/dresss;Put head through opening of pull over shirt/dress;Thread/unthread left sleeve of pullover shirt/dress;Pull shirt over trunk Upper body dressing/undressing: 5: Set-up assist to: Apply TLSO, cervical collar FIM - Lower Body Dressing/Undressing Lower body dressing/undressing steps patient completed: Thread/unthread left pants leg;Don/Doff left shoe Lower body dressing/undressing: 2: Max-Patient completed 25-49% of tasks (+2 for sit to stand)  FIM - Toileting Toileting: 1: Two helpers  FIM - Archivist Transfers: 0-Activity did not occur  FIM - Architectural technologist Transfer: 4: Supine > Sit: Min A (steadying Pt. > 75%/lift 1 leg);3: Bed > Chair or W/C: Mod A (lift or lower assist)  FIM - Locomotion: Wheelchair Locomotion: Wheelchair: 1: Total Assistance/staff pushes wheelchair (Pt<25%) FIM - Locomotion: Ambulation Locomotion: Ambulation Assistive Devices: Designer, industrial/product Ambulation/Gait Assistance: 1: +2 Total assist Locomotion: Ambulation: 0: Activity did not occur  Comprehension Comprehension Mode: Auditory Comprehension: 2-Understands basic 25 - 49% of the time/requires cueing 51 - 75% of the  time  Expression Expression Mode: Verbal Expression: 2-Expresses basic 25 - 49% of the time/requires cueing 50 - 75% of the  time. Uses single words/gestures.  Social Interaction Social Interaction: 3-Interacts appropriately 50 - 74% of the time - May be physically or verbally inappropriate.  Problem Solving Problem Solving: 2-Solves basic 25 - 49% of the time - needs direction more than half the time to initiate, plan or complete simple activities  Memory Memory: 2-Recognizes or recalls 25 - 49% of the time/requires cueing 51 - 75% of the time  1. Lumbar stenosis with radiculopathy. Status post bilateral laminotomies with decompression and removal of previous hardware. Complicated by acute arterial bleeding with vascular repair 06/23/2011  2. 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 Continue aspirin Plavix. Carotid Dopplers are pending  3. DVT Prophylaxis/Anticoagulation: SCDs. Monitor for any signs of deep vein thrombosis  4. Hypertension. aldomet 250 mg twice a day, Lopressor 150 mg twice a day. bp boderline at times. 5. Hypothyroidism. Synthroid  6. Mood/postoperative confusion. Presently on low dose Risperdal--wonder if we can dc this? 7. FEN: mild renal insufficiency  -push po fluids  -BUN and CR stable to improved  -watch for fluid overload given reduced CO. Holding lasix  -recheck bmet on Monday  -added megace for appetite 8.  CHF exacerbation EF is 40-45%- lasix added with good results but held   -monitor i's and o's as well as renal fxn  9. E COli UTI- on keflex currently for a total of 7 days of abx LOS (Days) 9 A FACE TO FACE EVALUATION WAS PERFORMED  Jean [name] T 07/11/2011, 8:35 AM

## 2011-07-12 NOTE — Progress Notes (Signed)
Speech Language Pathology Daily Session Note  Patient Details  Name: Jean [name] MRN: [id number] Date of Birth: 1928/09/12  Today's Date: 07/12/2011 Time: 1410-1440 Time Calculation (min): 30 min  Short Term Goals: Week 2: SLP Short Term Goal 1 (Week 2): Pt will consume trials of thin liquids without overt s/s of aspiration with supervision. SLP Short Term Goal 2 (Week 2): Pt will identify 1 physical and 1 cognitive deficit with Mod A verbal and semantic cues. SLP Short Term Goal 3 (Week 2): Pt will initiate functional tasks with Min A verbal and visual cues. SLP Short Term Goal 4 (Week 2): Pt will utilize visual aids  (schedule, calendar, etc.) to recall daily information with mod A semantic and verbal cues.  SLP Short Term Goal 5 (Week 2): Pt will sustain attention to a functional task for 2 mins with Max A verbal and tactile cues.  Skilled Therapeutic Interventions: Treatment focus on sustained attention, initiation and problem solving. Patient required max assist verbal, tactile and visual cues to attend to sorting task for ~30 seconds; SLP also facilitated basic functional problem solving with adding money with written cues and demonstration cues faded from max to mod assist during session.    Daily Session Pain Pain Assessment Pain Assessment: No/denies pain Pain Score: 0-No pain Patient reported beng SOB and that her chest felt funny, SLP notified RN  Therapy/Group: Individual Therapy  Charlane Ferretti., CCC-SLP [phone]  Jean [name] 07/12/2011, 3:52 PM

## 2011-07-12 NOTE — Progress Notes (Signed)
Occupational Therapy Session Note  Patient Details  Name: Jean [name] MRN: [id number] Date of Birth: [birth year]  Today's Date: 07/12/2011 Time: 7829-5621 Time Calculation (min): 45 min  Short Term Goals: Week 2:  OT Short Term Goal 1 (Week 2): Pt will demonstrate sustained attention for 3 min with mod A in a familar functional task OT Short Term Goal 2 (Week 2): Pt will don shirt with steadying A in sitting EOB OT Short Term Goal 3 (Week 2): Pt will transfer to toilet with min A OT Short Term Goal 4 (Week 2): Pt will perform sit to stand for clothing managment with max +1 OT Short Term Goal 5 (Week 2): Pt demonstrate intellecual awareness with mod A in a functional task  Skilled Therapeutic Interventions/Progress Updates:    1:1 self care retraining focus on dressing and grooming. Performed squat pivot with mod A bed to w/c. Grooming at sink with max cuing. Practiced sit to stand at sink multiple times holding onto sink with min to mod A, able to tolerate standing with min A for 30 sec in prep of for clothing management.   Therapy Documentation Precautions:  Precautions Precautions: Fall;Back Precaution Comments: required total A for recall of 3/3 precautions Required Braces or Orthoses: Spinal Brace Spinal Brace: Lumbar corset Restrictions Weight Bearing Restrictions: No Pain:  mild c/o back pain: when repositioned felt better  See FIM for current functional status  Therapy/Group: Individual Therapy  Roney Mans [facility] 07/12/2011, 3:18 PM

## 2011-07-12 NOTE — Progress Notes (Signed)
Patient ID: Jean [name], female   DOB: 07/13/1928, 78 y.o.   MRN: [id number] Subjective/Complaints: She and husband think that she is making progress with therapy. Objective: Vital Signs: Blood pressure 155/67, pulse 58, temperature 97.3 F (36.3 C), temperature source Oral, resp. rate 18, height 5\' 7"  (1.702 m), weight 147 lb 11.3 oz (67 kg), SpO2 95.00%. No results found. No results found for this basename: WBC:2,HGB:2,HCT:2,PLT:2 in the last 72 hours  Basename 07/10/11 0630  NA 139  K 3.8  CL 100  CO2 28  GLUCOSE 100*  BUN 30*  CREATININE 1.55*  CALCIUM 9.1   CBG (last 3)  No results found for this basename: GLUCAP:3 in the last 72 hours  Wt Readings from Last 3 Encounters:  07/09/11 147 lb 11.3 oz (67 kg)  07/02/11 171 lb 8.3 oz (77.8 kg)  07/02/11 171 lb 8.3 oz (77.8 kg)    Physical Exam:  No acute distress. Neck is supple. Chest clear to auscultation cardiac exam S1-S2 are regular. Abdominal exam active bowel sounds, soft.  Assessment/Plan: 1. Functional deficits secondary to lumbar stenosis, radiculopathy, with post op watershed infarct which require 3+ hours per day of interdisciplinary therapy in a comprehensive inpatient rehab setting. Physiatrist is providing close team supervision and 24 hour management of active medical problems listed below. Physiatrist and rehab team continue to assess barriers to discharge/monitor patient progress toward functional and medical goals. FIM: FIM - Bathing Bathing: 0: Activity did not occur  FIM - Upper Body Dressing/Undressing Upper body dressing/undressing steps patient completed: Thread/unthread right sleeve of pullover shirt/dresss;Put head through opening of pull over shirt/dress;Thread/unthread left sleeve of pullover shirt/dress;Pull shirt over trunk Upper body dressing/undressing: 5: Set-up assist to: Apply TLSO, cervical collar FIM - Lower Body Dressing/Undressing Lower body dressing/undressing steps patient  completed: Thread/unthread left pants leg;Don/Doff left shoe Lower body dressing/undressing: 2: Max-Patient completed 25-49% of tasks (+2 for sit to stand)  FIM - Toileting Toileting: 2: Max-Patient completed 1 of 3 steps  FIM - Toilet Transfers Toilet Transfers: 3-From toilet/BSC: Mod A (lift or lower assist);3-To toilet/BSC: Mod A (lift or lower assist)  FIM - Banker Devices: Therapist, occupational: 3: Chair or W/C > Bed: Mod A (lift or lower assist);3: Bed > Chair or W/C: Mod A (lift or lower assist);3: Sit > Supine: Mod A (lifting assist/Pt. 50-74%/lift 2 legs);3: Supine > Sit: Mod A (lifting assist/Pt. 50-74%/lift 2 legs  FIM - Locomotion: Wheelchair Distance: 300 Locomotion: Wheelchair: 2: Travels 50 - 149 ft with maximal assistance (Pt: 25 - 49%) FIM - Locomotion: Ambulation Locomotion: Ambulation Assistive Devices: Designer, industrial/product Ambulation/Gait Assistance: 1: +2 Total assist Locomotion: Ambulation: 1: Travels less than 50 ft with moderate assistance (Pt: 50 - 74%)  Comprehension Comprehension Mode: Auditory Comprehension: 2-Understands basic 25 - 49% of the time/requires cueing 51 - 75% of the time  Expression Expression Mode: Verbal Expression: 2-Expresses basic 25 - 49% of the time/requires cueing 50 - 75% of the time. Uses single words/gestures.  Social Interaction Social Interaction: 3-Interacts appropriately 50 - 74% of the time - May be physically or verbally inappropriate.  Problem Solving Problem Solving: 1-Solves basic less than 25% of the time - needs direction nearly all the time or does not effectively solve problems and may need a restraint for safety  Memory Memory: 1-Recognizes or recalls less than 25% of the time/requires cueing greater than 75% of the time  1. Lumbar stenosis with radiculopathy. Status post bilateral laminotomies with  decompression and removal of previous hardware. Complicated by acute  arterial bleeding with vascular repair 06/23/2011  2. 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 Continue aspirin Plavix. Carotid Dopplers are pending  3. DVT Prophylaxis/Anticoagulation: SCDs. Monitor for any signs of deep vein thrombosis  4. Hypertension. aldomet 250 mg twice a day, Lopressor 150 mg twice a day. bp boderline at times. 5. Hypothyroidism. Synthroid  6. Mood/postoperative confusion. Presently on low dose Risperdal--wonder if we can dc this? 7. FEN: mild renal insufficiency  -push po fluids  -BUN and CR stable to improved  -watch for fluid overload given reduced CO. Holding lasix  -recheck bmet on Monday  -added megace for appetite 8.  CHF exacerbation EF is 40-45%- lasix added with good results but held   -monitor i's and o's as well as renal fxn  9. E COli UTI- on keflex currently for a total of 7 days of abx LOS (Days) 10 A FACE TO FACE EVALUATION WAS PERFORMED  Charlann Wayne HENRY 07/12/2011, 5:37 PM

## 2011-07-12 NOTE — Progress Notes (Signed)
Pt complain of short of breath , resp even unlabored, sats 100% on room air , blood pressure 118/70, heart rate 61, denies any chest pain, will continue to monitor

## 2011-07-13 NOTE — Progress Notes (Signed)
Occupational Therapy Session Note  Patient Details  Name: Jean [name] MRN: [id number] Date of Birth: [birth year]  Today's Date: 07/13/2011 Time: 1505-1600 Time Calculation (min): 55 min  Short Term Goals: Week 1:  OT Short Term Goal 1 (Week 1): Pt will sit EOB for 3 min wtih min A in prep for ADL tasks OT Short Term Goal 1 - Progress (Week 1): Met OT Short Term Goal 2 (Week 1): Pt will demonstrate sustained attention with min A during funcitonal grooming tasks OT Short Term Goal 2 - Progress (Week 1): Not met OT Short Term Goal 3 (Week 1): Pt will transfer with LRAD with max +1 for basic transfers OT Short Term Goal 3 - Progress (Week 1): Met OT Short Term Goal 4 (Week 1): Pt will don shirt with mod A in sitting  OT Short Term Goal 4 - Progress (Week 1): Met  Skilled Therapeutic Interventions/Progress Updates:    Addressed bed mobility, sitting balance EOB during dressing, transfer, and attention to tasks.  Pt donned brace with min assist. Needed max cues to adhere to back precautions.  Utilized Reacher with max assist.  Transferred from bed to wc with max assist.   Pt.  Needed cues for spatial orientation of garments during dressing.  She maintained sustained attention for 1 minute before needed cues to get back on task.    Therapy Documentation Precautions:  Precautions Precautions: Fall;Back Precaution Comments: required total A for recall of 3/3 precautions Required Braces or Orthoses: Spinal Brace Spinal Brace: Lumbar corset Restrictions Weight Bearing Restrictions: No General:  Pain:  3/10      See FIM for current functional status  Therapy/Group: Individual Therapy  Humberto Seals 07/13/2011, 5:58 PM

## 2011-07-13 NOTE — Progress Notes (Signed)
+/-   sleep during night. A & O to self only. Decreased safety awareness requiring bedalarm for safety. Patient doesn't call for assistance, needs anticipated by staff. Noted at one point with legs hanging off bed. Foley patent with cloudy amber urine. Incontinent of stool, wears brief to manage incontinence. Tawanna Solo

## 2011-07-13 NOTE — Progress Notes (Signed)
Patient ID: Jean [name], female   DOB: 12-Nov-1928, 78 y.o.   MRN: [id number] Patient ID: Jean [name], female   DOB: [birth year], 77 y.o.   MRN: [id number] Subjective/Complaints: She has no specific complaints today. Objective: Vital Signs: Blood pressure 157/70, pulse 60, temperature 98.1 F (36.7 C), temperature source Oral, resp. rate 20, height 5\' 7"  (1.702 m), weight 147 lb 11.3 oz (67 kg), SpO2 97.00%.  Physical Exam:  No acute distress. Neck is supple. Chest clear to auscultation cardiac exam S1-S2 are regular. Abdominal exam active bowel sounds, soft.  Assessment/Plan: 1. Functional deficits secondary to lumbar stenosis, radiculopathy, with post op watershed infarct which require 3+ hours per day of interdisciplinary therapy in a comprehensive inpatient rehab setting. Physiatrist is providing close team supervision and 24 hour management of active medical problems listed below. Physiatrist and rehab team continue to assess barriers to discharge/monitor patient progress toward functional and medical goals. FIM: FIM - Bathing Bathing: 0: Activity did not occur  FIM - Upper Body Dressing/Undressing Upper body dressing/undressing steps patient completed: Thread/unthread right sleeve of pullover shirt/dresss;Put head through opening of pull over shirt/dress;Thread/unthread left sleeve of pullover shirt/dress;Pull shirt over trunk Upper body dressing/undressing: 5: Set-up assist to: Apply TLSO, cervical collar FIM - Lower Body Dressing/Undressing Lower body dressing/undressing steps patient completed: Thread/unthread left pants leg;Don/Doff left shoe Lower body dressing/undressing: 2: Max-Patient completed 25-49% of tasks (+2 for sit to stand)  FIM - Toileting Toileting: 2: Max-Patient completed 1 of 3 steps  FIM - Toilet Transfers Toilet Transfers: 3-From toilet/BSC: Mod A (lift or lower assist);3-To toilet/BSC: Mod A (lift or lower assist)  FIM - Event organiser Devices: Therapist, occupational: 3: Chair or W/C > Bed: Mod A (lift or lower assist);3: Bed > Chair or W/C: Mod A (lift or lower assist);3: Sit > Supine: Mod A (lifting assist/Pt. 50-74%/lift 2 legs);3: Supine > Sit: Mod A (lifting assist/Pt. 50-74%/lift 2 legs  FIM - Locomotion: Wheelchair Distance: 300 Locomotion: Wheelchair: 2: Travels 50 - 149 ft with maximal assistance (Pt: 25 - 49%) FIM - Locomotion: Ambulation Locomotion: Ambulation Assistive Devices: Designer, industrial/product Ambulation/Gait Assistance: 1: +2 Total assist Locomotion: Ambulation: 1: Travels less than 50 ft with moderate assistance (Pt: 50 - 74%)  Comprehension Comprehension Mode: Auditory Comprehension: 2-Understands basic 25 - 49% of the time/requires cueing 51 - 75% of the time  Expression Expression Mode: Verbal Expression: 2-Expresses basic 25 - 49% of the time/requires cueing 50 - 75% of the time. Uses single words/gestures.  Social Interaction Social Interaction: 3-Interacts appropriately 50 - 74% of the time - May be physically or verbally inappropriate.  Problem Solving Problem Solving: 1-Solves basic less than 25% of the time - needs direction nearly all the time or does not effectively solve problems and may need a restraint for safety  Memory Memory: 1-Recognizes or recalls less than 25% of the time/requires cueing greater than 75% of the time  1. Lumbar stenosis with radiculopathy. Status post bilateral laminotomies with decompression and removal of previous hardware. Complicated by acute arterial bleeding with vascular repair 06/23/2011  2. 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 Continue aspirin Plavix. Carotid Dopplers are pending  3. DVT Prophylaxis/Anticoagulation: SCDs. Monitor for any signs of deep vein thrombosis  4. Hypertension. aldomet 250 mg twice a day, Lopressor 150 mg twice a day. bp boderline at times. 5. Hypothyroidism. Synthroid  6.  Mood/postoperative confusion. Presently on low dose Risperdal--wonder if we can dc this? 7.  FEN: mild renal insufficiency  -on megace 8.  CHF exacerbation EF is 40-45%- lasix added with good results but held   -monitor i's and o's as well as renal fxn  9. E COli UTI- on keflex currently for a total of 7 days of abx (started 07/10/11) LOS (Days) 11 A FACE TO FACE EVALUATION WAS PERFORMED  Vicky Schleich HENRY 07/13/2011, 9:56 AM

## 2011-07-14 LAB — BASIC METABOLIC PANEL
Chloride: 103 mEq/L (ref 96–112)
GFR calc Af Amer: 35 mL/min — ABNORMAL LOW (ref >90)
GFR calc non Af Amer: 30 mL/min — ABNORMAL LOW (ref >90)
Glucose, Bld: 99 mg/dL (ref 70–99)
Potassium: 4.4 mEq/L (ref 3.5–5.1)
Sodium: 140 mEq/L (ref 135–145)

## 2011-07-14 MED ORDER — MEGESTROL ACETATE 400 MG/10ML PO SUSP
400.0000 mg | Freq: Two times a day (BID) | ORAL | Status: DC
  Administered 2011-07-14 – 2011-07-23 (×17): 400 mg via ORAL
  Filled 2011-07-14 (×22): qty 10

## 2011-07-14 MED ORDER — STARCH (THICKENING) PO POWD
227.0000 g | ORAL | Status: DC | PRN
  Filled 2011-07-14: qty 227

## 2011-07-14 NOTE — Plan of Care (Signed)
Problem: RH BOWEL ELIMINATION Goal: RH STG MANAGE BOWEL WITH ASSISTANCE Max assist  Outcome: Not Progressing Incontinent, no awareness today, total assist of staff for hygiene and changing depends

## 2011-07-14 NOTE — Care Management Note (Signed)
Per State Regulation 482.30 This chart was reviewed for medical necessity with respect to the patient's Admission/Duration of stay. Pt participating txs w/ slow gains.  Pain, and, especially, fatigue are limiting factors.   Brock Ra                 Nurse Care Manager              Next Review Date: 07/17/11

## 2011-07-14 NOTE — Progress Notes (Signed)
Occupational Therapy Session Note  Patient Details  Name: Jean [name] MRN: [id number] Date of Birth: 05/13/1928  Today's Date: 07/14/2011 Time: 1415-1450 Time Calculation (min): 35 min  Short Term Goals: Week 2:  OT Short Term Goal 1 (Week 2): Pt will demonstrate sustained attention for 3 min with mod A in a familar functional task OT Short Term Goal 2 (Week 2): Pt will don shirt with steadying A in sitting EOB OT Short Term Goal 3 (Week 2): Pt will transfer to toilet with min A OT Short Term Goal 4 (Week 2): Pt will perform sit to stand for clothing managment with max +1 OT Short Term Goal 5 (Week 2): Pt demonstrate intellecual awareness with mod A in a functional task  Skilled Therapeutic Interventions/Progress Updates:    1:1 self care: dtr present and discuss pt's progress, how her cognition impacts her performance in all ADL aspects. Dtr worried about her and her dad being able to take care of pt.  Dtr feels w/c may not even fit in her bedroom, recommended she may need a grab bar to assist with ADLs. Focus on sit to stand at sink to practice standing tolerance and balance, toileting sit to stand with toilet replaced when stood, accident with no awareness, clothing management with total A with steadying A.   Therapy Documentation Precautions:  Precautions Precautions: Fall;Back Precaution Comments: required total A for recall of 3/3 precautions Required Braces or Orthoses: Spinal Brace Spinal Brace: Lumbar corset Restrictions Weight Bearing Restrictions: No Pain:  no c/o pain  See FIM for current functional status  Therapy/Group: Individual Therapy  Roney Mans [facility] 07/14/2011, 3:24 PM

## 2011-07-14 NOTE — Progress Notes (Signed)
Physical Therapy Note  Patient Details  Name: Jean [name] MRN: [id number] Date of Birth: [birth year] Today's Date: 07/14/2011  Time: [phone] 56 minutes  No c/o pain.  Scoot/squat pivot transfers with mod A.  Treatment focused on standing balance and gait training.  Pt able to stand by pulling up on RW with A to hold RW steady with mod A.  Standing balance with reaching task with min-mod A for reaching out of BOS.  Pt limited by fear of falling, tends to lean backward at all times.  Gait training with RW 4 x 20' with mod A for wt shifts, RW control.  Pt requires mod-max cuing for attention to task.  Pt demo's poor motor coordination and motor planning and is limited by decreased selective attention in min-mod distracting environment and decreased initiation requiring cues > 50% of time to initiate functional movements.  Individual therapy   Landry Lookingbill 07/14/2011, 11:11 AM

## 2011-07-14 NOTE — Progress Notes (Signed)
Nutrition Follow-up  Noted pt on Megace BID at this time. Intake 25 - 95%; mostly 20-50%. Despite variable meal intake per computer, husband and pt report intake is improving. States that pt eats best at breakfast, and consumed at least 90% of breakfast today. Taking supplements.  Diet Order:  Dysphagia 3 with Nectar Thickened Liquids Supplements: Ensure Pudding TID and Breeze BID  Meds: Scheduled Meds:   . aspirin  81 mg Oral Daily  . cephALEXin  250 mg Oral Q6H  . clopidogrel  75 mg Oral Q breakfast  . feeding supplement  1 Container Oral TID BM  . feeding supplement  1 Container Oral BID BM  . levothyroxine  75 mcg Oral QAC breakfast  . megestrol  400 mg Oral BID  . methyldopa  250 mg Oral BID  . metoprolol tartrate  100 mg Oral BID  . pantoprazole  40 mg Oral Q1200  . DISCONTD: megestrol  400 mg Oral Daily   Continuous Infusions:  PRN Meds:.acetaminophen, menthol-cetylpyridinium, methocarbamol, nitroGLYCERIN, ondansetron (ZOFRAN) IV, ondansetron, phenol, polyethylene glycol, sorbitol  Labs:  CMP     Component Value Date/Time   NA 140 07/14/2011 0641   K 4.4 07/14/2011 0641   CL 103 07/14/2011 0641   CO2 25 07/14/2011 0641   GLUCOSE 99 07/14/2011 0641   GLUCOSE 100* 01/30/2006 1630   BUN 27* 07/14/2011 0641   CREATININE 1.57* 07/14/2011 0641   CALCIUM 9.4 07/14/2011 0641   CALCIUM 9.5 08/18/2006 0105   PROT 5.4* 07/03/2011 0622   ALBUMIN 2.0* 07/03/2011 0622   AST 34 07/03/2011 0622   ALT 25 07/03/2011 0622   ALKPHOS 103 07/03/2011 0622   BILITOT 0.6 07/03/2011 0622   GFRNONAA 30* 07/14/2011 0641   GFRAA 35* 07/14/2011 0641     Intake/Output Summary (Last 24 hours) at 07/14/11 1155 Last data filed at 07/14/11 0900  Gross per 24 hour  Intake    300 ml  Output    675 ml  Net   -375 ml    Weight Status:  67 kg/147 lb - wt down 9.2 kg x 1 week; usual weight 145 - 150 lb  Estimated needs:  1600 - 1800 kcal, 80 - 90 grams protein  Nutrition Dx:  Inadequate oral intake r/t variable  appetite AEB fluctuation PO intake.  Goal:  Pt to consume >/= 75% of meals and supplements. Not met consistently with meals.  Intervention:  Continue current interventions.  Monitor:  Weights, labs, PO intake, I/O's  Adair Laundry Pager #:  [phone]

## 2011-07-14 NOTE — Progress Notes (Signed)
Speech Language Pathology Daily Session Note  Patient Details  Name: Jean [name] MRN: [id number] Date of Birth: Nov 16, 1928  Today's Date: 07/14/2011 Time: 5366-4403 Time Calculation (min): 60 min  Short Term Goals:  SLP Short Term Goal 1 (Week 2): Pt will consume trials of thin liquids without overt s/s of aspiration with supervision. SLP Short Term Goal 2 (Week 2): Pt will identify 1 physical and 1 cognitive deficit with Mod A verbal and semantic cues. SLP Short Term Goal 3 (Week 2): Pt will initiate functional tasks with Min A verbal and visual cues. SLP Short Term Goal 4 (Week 2): Pt will utilize visual aids  (schedule, calendar, etc.) to recall daily information with mod A semantic and verbal cues.  SLP Short Term Goal 5 (Week 2): Pt will sustain attention to a functional task for 2 mins with Max A verbal and tactile cues.  Skilled Therapeutic Interventions: Treatment focus on initiation, problem solving, and working memory. Pt required Max A for initiation with self-feeding with verbal and visual cues and for sustained attention to task. Pt with intermittent throat clear x 2 with meal. Pt oriented X 4 with extra time. Pt also required Mod A verbal and semantic cues to utilize her schedule to recall morning events and anticipate other therapy sessions.   Daily Session FIM:  Comprehension Comprehension Mode: Auditory Comprehension: 3-Understands basic 50 - 74% of the time/requires cueing 25 - 50%  of the time Expression Expression Mode: Verbal Expression: 2-Expresses basic 25 - 49% of the time/requires cueing 50 - 75% of the time. Uses single words/gestures. Social Interaction Social Interaction: 3-Interacts appropriately 50 - 74% of the time - May be physically or verbally inappropriate. Problem Solving Problem Solving: 1-Solves basic less than 25% of the time - needs direction nearly all the time or does not effectively solve problems and may need a restraint for  safety Memory Memory: 1-Recognizes or recalls less than 25% of the time/requires cueing greater than 75% of the time FIM - Eating Eating Activity: 5: Supervision/cues  Pain: No/Denies Pain  Therapy/Group: Individual Therapy  Neo Yepiz 07/14/2011, 3:34 PM

## 2011-07-14 NOTE — Plan of Care (Signed)
Problem: RH BLADDER ELIMINATION Goal: RH STG MANAGE BLADDER WITH EQUIPMENT WITH ASSISTANCE STG Manage Bladder With Equipment With Max Assistance  Outcome: Not Progressing Pt with foley cath to straight drain

## 2011-07-14 NOTE — Progress Notes (Signed)
Patient ID: Jean [name], female   DOB: 08-05-1928, 78 y.o.   MRN: [id number] Subjective/Complaints: Appetite better over the weekend, but slow to initiate eating today. Denies any nausea or other GI complaints Ros performed and otherwise negative  Objective: Vital Signs: Blood pressure 159/79, pulse 58, temperature 97.7 F (36.5 C), temperature source Oral, resp. rate 18, height 5\' 7"  (1.702 m), weight 67 kg (147 lb 11.3 oz), SpO2 98.00%. No results found. No results found for this basename: WBC:2,HGB:2,HCT:2,PLT:2 in the last 72 hours  Basename 07/14/11 0641  NA 140  K 4.4  CL 103  CO2 25  GLUCOSE 99  BUN 27*  CREATININE 1.57*  CALCIUM 9.4   CBG (last 3)  No results found for this basename: GLUCAP:3 in the last 72 hours  Wt Readings from Last 3 Encounters:  07/09/11 67 kg (147 lb 11.3 oz)  07/02/11 77.8 kg (171 lb 8.3 oz)  07/02/11 77.8 kg (171 lb 8.3 oz)    Physical Exam:  General appearance: alert, cooperative, appears stated age and no distress Head: Normocephalic, without obvious abnormality, atraumatic Eyes: conjunctivae/corneas clear. PERRL, EOM's intact.  Ears: normal external ear canals both ears Nose: Nares normal. Septum midline. Mucosa normal. No drainage or sinus tenderness. Throat: lips, mucosa, and tongue normal; teeth and gums normal Neck: no adenopathy, no carotid bruit, no JVD, supple, symmetrical, trachea midline and thyroid not enlarged, symmetric, no tenderness/mass/nodules Back: tender superior to incision. posture poor.  Resp: clear to auscultation bilaterally, no obvious wheezes or rales Cardio: regular rate and rhythm, S1, S2 normal, no murmur, click, rub or gallop GI: soft, non-tender; bowel sounds normal; no masses,  no organomegaly Extremities: extremities normal, atraumatic, no cyanosis or edema Pulses: 1+ edema lue Skin: Skin color, texture, turgor normal. No rashes or lesions Neurologic: fairly alert. Gross, generalized weakness--let more  than right (inconsistent) no focal CN findings. Limited insight and awareness. 4/5 Strength BUE and BLE Incision/Wound: clean and intact and essentially closed   Assessment/Plan: 1. Functional deficits secondary to lumbar stenosis, radiculopathy, with post op watershed infarct which require 3+ hours per day of interdisciplinary therapy in a comprehensive inpatient rehab setting. Physiatrist is providing close team supervision and 24 hour management of active medical problems listed below. Physiatrist and rehab team continue to assess barriers to discharge/monitor patient progress toward functional and medical goals. FIM: FIM - Bathing Bathing: 0: Activity did not occur  FIM - Upper Body Dressing/Undressing Upper body dressing/undressing steps patient completed: Thread/unthread right sleeve of pullover shirt/dresss;Thread/unthread left sleeve of pullover shirt/dress;Put head through opening of pull over shirt/dress Upper body dressing/undressing: 4: Min-Patient completed 75 plus % of tasks FIM - Lower Body Dressing/Undressing Lower body dressing/undressing steps patient completed: Thread/unthread right underwear leg;Thread/unthread left underwear leg Lower body dressing/undressing: 2: Max-Patient completed 25-49% of tasks  FIM - Toileting Toileting: 2: Max-Patient completed 1 of 3 steps  FIM - Toilet Transfers Toilet Transfers: 3-From toilet/BSC: Mod A (lift or lower assist)  FIM - Banker Devices: Manufacturing systems engineer Transfer: 2: Supine > Sit: Max A (lifting assist/Pt. 25-49%);1: Bed > Chair or W/C: Total A (helper does all/Pt. < 25%)  FIM - Locomotion: Wheelchair Distance: 300 Locomotion: Wheelchair: 1: Travels less than 50 ft with maximal assistance (Pt: 25 - 49%) FIM - Locomotion: Ambulation Locomotion: Ambulation Assistive Devices: Walker - Rolling Ambulation/Gait Assistance: 1: +2 Total assist Locomotion: Ambulation: 1: Travels less than 50  ft with moderate assistance (Pt: 50 - 74%)  Comprehension Comprehension Mode:  Auditory Comprehension: 2-Understands basic 25 - 49% of the time/requires cueing 51 - 75% of the time  Expression Expression Mode: Verbal Expression: 2-Expresses basic 25 - 49% of the time/requires cueing 50 - 75% of the time. Uses single words/gestures.  Social Interaction Social Interaction: 3-Interacts appropriately 50 - 74% of the time - May be physically or verbally inappropriate.  Problem Solving Problem Solving: 1-Solves basic less than 25% of the time - needs direction nearly all the time or does not effectively solve problems and may need a restraint for safety  Memory Memory: 1-Recognizes or recalls less than 25% of the time/requires cueing greater than 75% of the time  1. Lumbar stenosis with radiculopathy. Status post bilateral laminotomies with decompression and removal of previous hardware. Complicated by acute arterial bleeding with vascular repair 06/23/2011  2. 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 Continue aspirin Plavix. Carotid Dopplers are pending  3. DVT Prophylaxis/Anticoagulation: SCDs. Monitor for any signs of deep vein thrombosis  4. Hypertension. aldomet 250 mg twice a day, Lopressor 150 mg twice a day. bp boderline at times. 5. Hypothyroidism. Synthroid  6. Mood/postoperative confusion. Presently on low dose Risperdal--wonder if we can dc this? 7. FEN: mild renal insufficiency  -push po fluids  -BUN and CR generally stable  -continue to hold lasix  -recheck bmet later this week  -added megace for appetite- increase to bid  -fluid upgrade this week? 8.  CHF exacerbation EF is 40-45%- lasix added with good results but held   -monitor i's and o's as well as renal fxn  9. E COli UTI- on keflex currently for a total of 7 days of abx LOS (Days) 12 A FACE TO FACE EVALUATION WAS PERFORMED  Rickeya Manus T 07/14/2011, 8:42 AM

## 2011-07-14 NOTE — Progress Notes (Signed)
Occupational Therapy Session Note  Patient Details  Name: Jean [name] MRN: [id number] Date of Birth: 1928/09/15  Today's Date: 07/14/2011 Time: 4098-1191 Time Calculation (min): 45 min  Short Term Goals: Week 2:  OT Short Term Goal 1 (Week 2): Pt will demonstrate sustained attention for 3 min with mod A in a familar functional task OT Short Term Goal 2 (Week 2): Pt will don shirt with steadying A in sitting EOB OT Short Term Goal 3 (Week 2): Pt will transfer to toilet with min A OT Short Term Goal 4 (Week 2): Pt will perform sit to stand for clothing managment with max +1 OT Short Term Goal 5 (Week 2): Pt demonstrate intellecual awareness with mod A in a functional task  Skilled Therapeutic Interventions/Progress Updates:    1:1 self care retraining. Pt very sleepy this am. Bed mobility with mod A to come from side lying to sitting EOB. Pt mod A squat pivot transfer from bed to w/c. Focus on sustained attention; only able to maintain it for 1 min at max requiring total A throughout session to complete each task (doffing pjs, donning clean shirt- needing total A multiple times to orient shirt). Pt required total A to attend to putting toothpaste on toothbrush before brushing teeth. Performed sit to stand for bottom hygiene and clothing management at the sink; needing mod A and able to maintain standing balance with min A for 45 sec. Pt unaware of when need to have BM- put her on toilet while she was having a BM.  Therapy Documentation Precautions:  Precautions Precautions: Fall;Back Precaution Comments: required total A for recall of 3/3 precautions Required Braces or Orthoses: Spinal Brace Spinal Brace: Lumbar corset Restrictions Weight Bearing Restrictions: No Pain:  c/o right side pain: relief with repositioning  See FIM for current functional status  Therapy/Group: Individual Therapy  Roney Mans [facility] 07/14/2011, 10:31 AM

## 2011-07-14 NOTE — Plan of Care (Signed)
Problem: RH SAFETY Goal: RH STG ADHERE TO SAFETY PRECAUTIONS W/ASSISTANCE/DEVICE STG Adhere to Safety Precautions With Mod Assistance/Device.  Outcome: Progressing Pt does not attempt to get OOB without assistance, side rails x 3, bed alarm on when no family present

## 2011-07-14 NOTE — Plan of Care (Signed)
Problem: RH SKIN INTEGRITY Goal: RH STG MAINTAIN SKIN INTEGRITY WITH ASSISTANCE STG Maintain Skin Integrity With Max Assistance.  Outcome: Not Progressing Pt unable to asist with skin care or dressing changes at this time, total assist of staff

## 2011-07-14 NOTE — Plan of Care (Signed)
Problem: RH BOWEL ELIMINATION Goal: RH STG MANAGE BOWEL WITH ASSISTANCE STG Manage Bowel with Max Assistance.  Outcome: Not Progressing Incontinent, pt total assist of staff for hygiene and changing of depends

## 2011-07-15 ENCOUNTER — Inpatient Hospital Stay (HOSPITAL_COMMUNITY): Payer: Medicare Other

## 2011-07-15 DIAGNOSIS — I6330 Cerebral infarction due to thrombosis of unspecified cerebral artery: Secondary | ICD-10-CM

## 2011-07-15 DIAGNOSIS — M4716 Other spondylosis with myelopathy, lumbar region: Secondary | ICD-10-CM

## 2011-07-15 DIAGNOSIS — Z5189 Encounter for other specified aftercare: Secondary | ICD-10-CM

## 2011-07-15 NOTE — Progress Notes (Signed)
Speech Language Pathology Daily Session Note  Patient Details  Name: Jean [name] MRN: [id number] Date of Birth: 1928-04-03  Today's Date: 07/15/2011 Time: 0910-0930 Time Calculation (min): 20 min  Short Term Goals: Week 2: SLP Short Term Goal 1 (Week 2): Pt will consume trials of thin liquids without overt s/s of aspiration with supervision. SLP Short Term Goal 1 - Progress (Week 2): Met (MBS complete-recommended thin liquids) SLP Short Term Goal 2 (Week 2): Pt will identify 1 physical and 1 cognitive deficit with Mod A verbal and semantic cues. SLP Short Term Goal 3 (Week 2): Pt will initiate functional tasks with Min A verbal and visual cues. SLP Short Term Goal 3 - Progress (Week 2): Progressing toward goal SLP Short Term Goal 4 (Week 2): Pt will utilize visual aids  (schedule, calendar, etc.) to recall daily information with mod A semantic and verbal cues.  SLP Short Term Goal 5 (Week 2): Pt will sustain attention to a functional task for 2 mins with Max A verbal and tactile cues.  Skilled Therapeutic Interventions: MBS complete. Please see full MBS report for details.   Daily Session Precautions/Restrictions    FIM:  Comprehension Comprehension: 4-Understands basic 75 - 89% of the time/requires cueing 10 - 24% of the time Expression Expression: 4-Expresses basic 75 - 89% of the time/requires cueing 10 - 24% of the time. Needs helper to occlude trach/needs to repeat words. Social Interaction Social Interaction: 4-Interacts appropriately 75 - 89% of the time - Needs redirection for appropriate language or to initiate interaction. Problem Solving Problem Solving: 3-Solves basic 50 - 74% of the time/requires cueing 25 - 49% of the time Memory Memory: 1-Recognizes or recalls less than 25% of the time/requires cueing greater than 75% of the time FIM - Eating Eating Activity: 5: Set-up assist for cut food General    Pain Pain Assessment Pain Assessment: No/denies  pain Cognition:   Oral/Motor: Oral Motor/Sensory Function Overall Oral Motor/Sensory Function: Appears within functional limits for tasks assessed Motor Speech Overall Motor Speech: Appears within functional limits for tasks assessed Comprehension: Auditory Comprehension Overall Auditory Comprehension: Impaired Yes/No Questions: Impaired Complex Questions: 75-100% accurate Commands: Impaired Two Step Basic Commands: 50-74% accurate (impacted by attention) Multistep Basic Commands: 25-49% accurate (impacted by attention) Conversation: Simple Other Conversation Comments: Comprehension is impacted by pt's impaired sustained attention, decreased processing speed and impaired working memory.  Interfering Components: Attention;Pain;Processing speed;Working Radio broadcast assistant: Wellsite geologist: Within [employer] Reading Comprehension Reading Status: Impaired Functional Environmental (signs, name badge): Within functional limits Interfering Components: Attention;Working Dance movement psychotherapist: Verbal cueing;Visual cueing Expression: Expression Primary Mode of Expression: Verbal Verbal Expression Overall Verbal Expression: Impaired Initiation: Impaired Automatic Speech: Name;Social Response Level of Generative/Spontaneous Verbalization: Sentence Repetition: No impairment Naming: No impairment Pragmatics: Impairment Impairments: Topic maintenance Interfering Components: Attention Effective Techniques: Semantic cues Other Verbal Expression Comments: Pt's verbal expression impacted, working memory, and impaired thought organization.  Written Expression Dominant Hand: Right Written Expression: Not tested  Therapy/Group: MBSS/FEES  Jamiel Goncalves Meryl 07/15/2011, 10:27 AM

## 2011-07-15 NOTE — Progress Notes (Signed)
Speech Language Pathology Daily Session Note  Patient Details  Name: Jean [name] MRN: [id number] Date of Birth: 21-Mar-1928  Today's Date: 07/15/2011 Time: 1530-1600 Time Calculation (min): 30 min  Short Term Goals:  SLP Short Term Goal 1 (Week 2): Pt will consume trials of thin liquids without overt s/s of aspiration with supervision. SLP Short Term Goal 1 - Progress (Week 2): Met (MBS complete-recommended thin liquids) SLP Short Term Goal 2 (Week 2): Pt will identify 1 physical and 1 cognitive deficit with Mod A verbal and semantic cues. SLP Short Term Goal 3 (Week 2): Pt will initiate functional tasks with Min A verbal and visual cues. SLP Short Term Goal 3 - Progress (Week 2): Progressing toward goal SLP Short Term Goal 4 (Week 2): Pt will utilize visual aids  (schedule, calendar, etc.) to recall daily information with mod A semantic and verbal cues.  SLP Short Term Goal 5 (Week 2): Pt will sustain attention to a functional task for 2 mins with Max A verbal and tactile cues.  Skilled Therapeutic Interventions: Treatment focus on functional problem solving and intellectual awareness. Pt required Max verbal and semantic cues to recall 2 physical and cognitive changes since hospitalization. Pt became tearful duridiscussion ng but educated on progress. Pt required Mod A semantic cues to recall results from MBSS and swallowing precautions. Utilized menu to select meal options with Mod A verbal and visual cues and extra time. Educated pt's daughter and husband on results from [facility] and current swallowing precautions. Both verbalized understanding.   Daily Session FIM:  Comprehension Comprehension Mode: Auditory Comprehension: 4-Understands basic 75 - 89% of the time/requires cueing 10 - 24% of the time Expression Expression Mode: Verbal Expression: 4-Expresses basic 75 - 89% of the time/requires cueing 10 - 24% of the time. Needs helper to occlude trach/needs to repeat words. Social  Interaction Social Interaction: 4-Interacts appropriately 75 - 89% of the time - Needs redirection for appropriate language or to initiate interaction. Problem Solving Problem Solving: 2-Solves basic 25 - 49% of the time - needs direction more than half the time to initiate, plan or complete simple activities Memory Memory: 2-Recognizes or recalls 25 - 49% of the time/requires cueing 51 - 75% of the time FIM - Eating Eating Activity: 5: Set-up assist for apply device (including dentures)  Pain: No/Denies Pain   Therapy/Group: Individual Therapy  Takari Lundahl 07/15/2011, 4:06 PM

## 2011-07-15 NOTE — Progress Notes (Signed)
Physical Therapy Session Note  Patient Details  Name: Jean [name] MRN: [id number] Date of Birth: 1928/05/28  Today's Date: 07/15/2011 Time: 1191-4782 Time Calculation (min): 42 min  Short Term Goals: Week 2:  PT Short Term Goal 1 (Week 2): Pt will consistantly transfer bed <> chair with min A PT Short Term Goal 2 (Week 2): Pt will gait 10' with max A PT Short Term Goal 3 (Week 2): Pt wil maintain static standing balance with mod A for functional activity PT Short Term Goal 4 (Week 2): Pt will sustain attention to functional activity x 2 mins with min A  Skilled Therapeutic Interventions/Progress Updates:    therapeutic activity- transfer training and balance training, impaired problem solving noted and pt unable to recall results of swallow study she just completed,   Gait with RW, attempted to use metranome to help with gait pattern to increase smoothness and step consistency but no change, max cues for initiating steps and assist for walker mod A overall with some backward lean noted initially- cues to not twist to maintain back precautions on turns Pt c/o fatigue during session, unable to state back precautions  Therapy Documentation Precautions:  Precautions Precautions: Fall;Back Precaution Comments: required total A for recall of 3/3 precautions Required Braces or Orthoses: Spinal Brace Spinal Brace: Lumbar corset Restrictions Weight Bearing Restrictions: No Pain:none Mobility: Transfers Sit to Stand: 3: Mod assist Stand to Sit: 5: Supervision;4: Min assist Lateral/Scoot Transfers: 4: Min assist Lateral/Scoot Transfer Details (indicate cue type and reason): sccot pivot w/c to and from mat level transfer with increased time and instructional cues pt =80% Locomotion : Ambulation Ambulation: Yes Ambulation/Gait Assistance: 3: Mod assist Ambulation Distance (Feet): 10 Feet (x2) Ambulation/Gait Assistance Details: Verbal cues for technique;Verbal cues for safe use of  DME/AE Ambulation/Gait Assistance Details: c/o feeling as if R knee would give out but no instability noted      Other Treatments: Treatments Therapeutic Activity: standing holding w/c or walker without posterior lean today, stood with min steady A to change brief d/t bowel incontinence  See FIM for current functional status  Therapy/Group: Individual Therapy  Michaelene Song 07/15/2011, 11:11 AM

## 2011-07-15 NOTE — Progress Notes (Addendum)
Occupational Therapy Session Note  Patient Details  Name: Jean [name] MRN: [id number] Date of Birth: 31-Jul-1928  Today's Date: 07/15/2011 Time:  - 730 830 ( )    Short Term Goals: Week 2:  OT Short Term Goal 1 (Week 2): Pt will demonstrate sustained attention for 3 min with mod A in a familar functional task OT Short Term Goal 2 (Week 2): Pt will don shirt with steadying A in sitting EOB OT Short Term Goal 3 (Week 2): Pt will transfer to toilet with min A OT Short Term Goal 4 (Week 2): Pt will perform sit to stand for clothing managment with max +1 OT Short Term Goal 5 (Week 2): Pt demonstrate intellecual awareness with mod A in a functional task  Skilled Therapeutic Interventions/Progress Updates:    1:1 self care retraining at sink to include UB bathing , dressing, grooming and eating. Bed mobility rolling to the right, sidelying to sit with min A, mod A stand pivot transfer to w/c with 2 attempts before success (with extra time), sustained attention better today during a functional task only requiring min A and subtle tactile cues, able to sequence and complete UB bathing and dressing with min cuing and setup with extra time, increased attention to complete toothbrushing. Oriented to person, time, place and situation with some prompting. Pt became upset when talking about the stroke displaying increased awareness but unable to name something that is different / or what she is working on in therapy.  Therapy Documentation Precautions:  Precautions Precautions: Fall;Back Precaution Comments: required total A for recall of 3/3 precautions Required Braces or Orthoses: Spinal Brace Spinal Brace: Lumbar corset Restrictions Weight Bearing Restrictions: No Pain:  no c/o pain   See FIM for current functional status  Therapy/Group: Individual Therapy  Roney Mans [facility] 07/15/2011, 8:18 AM

## 2011-07-15 NOTE — Progress Notes (Signed)
Social Work Patient ID: Jean [name], female   DOB: [birth year], 78 y.o.   MRN: [id number]   Per RN CM, family reports they are considering a change in d/c plan to SNF and requesting info on area facilities.  Provided a list of facilities with plan to follow up with them in a couple of days.  Pt aware of this possible change in d/c plan.  Wilfrido Luedke

## 2011-07-15 NOTE — Progress Notes (Signed)
Physical Therapy Session Note  Patient Details  Name: Jean [name] MRN: [id number] Date of Birth: 08/20/1928  Today's Date: 07/15/2011 Time: 1510-1530 Time Calculation (min): 20 min  Short Term Goals: Week 2:  PT Short Term Goal 1 (Week 2): Pt will consistantly transfer bed <> chair with min A PT Short Term Goal 2 (Week 2): Pt will gait 10' with max A PT Short Term Goal 3 (Week 2): Pt wil maintain static standing balance with mod A for functional activity PT Short Term Goal 4 (Week 2): Pt will sustain attention to functional activity x 2 mins with min A  Skilled Therapeutic Interventions/Progress Updates:    Difficult to arouse, daughter states she had been sleeping 2.5 hrs; mod A to edge of bed then scoot/squat pivot mod A to chair to L.  Catheter had been removed and pt agreed to try toileting before leaving for therapy. Pt required signficantly more assistance for standing and was unable to stand fully upright. Toileting required +2 with pt leaning far forward with PT in front, grab bar or RW.  Pt unable to help with pants or hygiene at all and was unable to urinate but did move bowels.  Therapy Documentation Precautions:  Precautions Precautions: Fall;Back Precaution Comments: required total A for recall of 3/3 precautions Required Braces or Orthoses: Spinal Brace Spinal Brace: Lumbar corset Restrictions Weight Bearing Restrictions: No   Pain:none   See FIM for current functional status  Therapy/Group: Individual Therapy  Michaelene Song 07/15/2011, 3:50 PM

## 2011-07-15 NOTE — Progress Notes (Signed)
Patient ID: Jean [name], female   DOB: 26-Jul-1928, 78 y.o.   MRN: [id number] Patient ID: Jean [name], female   DOB: [birth year], 78 y.o.   MRN: [id number] Subjective/Complaints: Still not eating a whole lot. Ros performed and otherwise negative  Objective: Vital Signs: Blood pressure 156/74, pulse 59, temperature 97.8 F (36.6 C), temperature source Oral, resp. rate 18, height 5\' 7"  (1.702 m), weight 67 kg (147 lb 11.3 oz), SpO2 96.00%. No results found. No results found for this basename: WBC:2,HGB:2,HCT:2,PLT:2 in the last 72 hours  Basename 07/14/11 0641  NA 140  K 4.4  CL 103  CO2 25  GLUCOSE 99  BUN 27*  CREATININE 1.57*  CALCIUM 9.4   CBG (last 3)  No results found for this basename: GLUCAP:3 in the last 72 hours  Wt Readings from Last 3 Encounters:  07/09/11 67 kg (147 lb 11.3 oz)  07/02/11 77.8 kg (171 lb 8.3 oz)  07/02/11 77.8 kg (171 lb 8.3 oz)    Physical Exam:  General appearance: alert, cooperative, appears stated age and no distress Head: Normocephalic, without obvious abnormality, atraumatic Eyes: conjunctivae/corneas clear. PERRL, EOM's intact.  Ears: normal external ear canals both ears Nose: Nares normal. Septum midline. Mucosa normal. No drainage or sinus tenderness. Throat: lips, mucosa, and tongue normal; teeth and gums normal Neck: no adenopathy, no carotid bruit, no JVD, supple, symmetrical, trachea midline and thyroid not enlarged, symmetric, no tenderness/mass/nodules Back: tender superior to incision. posture poor.  Resp: clear to auscultation bilaterally, no obvious wheezes or rales Cardio: regular rate and rhythm, S1, S2 normal, no murmur, click, rub or gallop GI: soft, non-tender; bowel sounds normal; no masses,  no organomegaly Extremities: extremities normal, atraumatic, no cyanosis or edema Pulses: 1+ edema lue Skin: Skin color, texture, turgor normal. No rashes or lesions Neurologic: fairly alert. Gross, generalized weakness--let more  than right (inconsistent) no focal CN findings. Limited insight and awareness. 4/5 Strength BUE and BLE Incision/Wound: clean and intact and essentially closed   Assessment/Plan: 1. Functional deficits secondary to lumbar stenosis, radiculopathy, with post op watershed infarct which require 3+ hours per day of interdisciplinary therapy in a comprehensive inpatient rehab setting. Physiatrist is providing close team supervision and 24 hour management of active medical problems listed below. Physiatrist and rehab team continue to assess barriers to discharge/monitor patient progress toward functional and medical goals. FIM: FIM - Bathing Bathing Steps Patient Completed: Chest;Right Arm;Left Arm;Abdomen Bathing: 2: Max-Patient completes 3-4 79f 10 parts or 25-49%  FIM - Upper Body Dressing/Undressing Upper body dressing/undressing steps patient completed: Thread/unthread right sleeve of pullover shirt/dresss;Put head through opening of pull over shirt/dress;Thread/unthread left sleeve of pullover shirt/dress;Pull shirt over trunk Upper body dressing/undressing: 4: Steadying assist FIM - Lower Body Dressing/Undressing Lower body dressing/undressing steps patient completed: Thread/unthread right pants leg;Don/Doff right shoe;Don/Doff left sock Lower body dressing/undressing: 2: Max-Patient completed 25-49% of tasks  FIM - Toileting Toileting: 2: Max-Patient completed 1 of 3 steps  FIM - Toilet Transfers Toilet Transfers: 3-From toilet/BSC: Mod A (lift or lower assist)  FIM - Banker Devices: Therapist, occupational: 4: Supine > Sit: Min A (steadying Pt. > 75%/lift 1 leg);3: Bed > Chair or W/C: Mod A (lift or lower assist)  FIM - Locomotion: Wheelchair Distance: 300 Locomotion: Wheelchair: 1: Total Assistance/staff pushes wheelchair (Pt<25%) FIM - Locomotion: Ambulation Locomotion: Ambulation Assistive Devices: Designer, industrial/product Ambulation/Gait  Assistance: 1: +2 Total assist Locomotion: Ambulation: 1: Travels less than 50 ft with maximal  assistance (Pt: 25 - 49%)  Comprehension Comprehension Mode: Auditory Comprehension: 2-Understands basic 25 - 49% of the time/requires cueing 51 - 75% of the time  Expression Expression Mode: Verbal Expression: 2-Expresses basic 25 - 49% of the time/requires cueing 50 - 75% of the time. Uses single words/gestures.  Social Interaction Social Interaction: 3-Interacts appropriately 50 - 74% of the time - May be physically or verbally inappropriate.  Problem Solving Problem Solving: 1-Solves basic less than 25% of the time - needs direction nearly all the time or does not effectively solve problems and may need a restraint for safety  Memory Memory: 1-Recognizes or recalls less than 25% of the time/requires cueing greater than 75% of the time  1. Lumbar stenosis with radiculopathy. Status post bilateral laminotomies with decompression and removal of previous hardware. Complicated by acute arterial bleeding with vascular repair 06/23/2011  2. 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 Continue aspirin Plavix. Carotid Dopplers are pending  3. DVT Prophylaxis/Anticoagulation: SCDs. Monitor for any signs of deep vein thrombosis  4. Hypertension. aldomet 250 mg twice a day, Lopressor 150 mg twice a day. bp boderline at times. 5. Hypothyroidism. Synthroid  6. Mood/postoperative confusion. Presently on low dose Risperdal--wonder if we can dc this? 7. FEN: mild renal insufficiency  -push po fluids  -BUN and CR generally stable  -continue to hold lasix  -recheck bmet later this week  -added megace for appetite- increased to bid- only ate 25 percent yesterday  -fluid upgrade this week. mbs per slp. Upgrade may help 8.  CHF exacerbation EF is 40-45%- lasix added with good results but held   -monitor i's and o's as well as renal fxn  9. E COli UTI- on keflex currently for  a total of 7 days of abx  -dc foley and begin voiding trial. LOS (Days) 13 A FACE TO FACE EVALUATION WAS PERFORMED  Lavonte Palos T 07/15/2011, 8:30 AM

## 2011-07-15 NOTE — Progress Notes (Signed)
Recreational Therapy Session Note  Patient Details  Name: Jean [name] MRN: [id number] Date of Birth: 17-Jan-1929 Today's Date: 07/15/2011  Pain: no c/o Skilled Therapeutic Interventions/Progress Updates: Sat w/c level for horticultural therapy activity with moderate cues to attend to task.  Pt worked on Designer, fashion/clothing with max cues for attention, initiation, and problem solving.   Therapy/Group: Individual Therapy  Daxx Tiggs 07/15/2011, 4:15 PM

## 2011-07-15 NOTE — Procedures (Signed)
Objective Swallowing Evaluation: Modified Barium Swallowing Study  Patient Details  Name: ANNELIES COYT MRN: [id number] Date of Birth: January 07, 1929  Today's Date: 07/15/2011 Time: 0730-0830 Time Calculation (min): 60 min  Past Medical History:  Past Medical History  Diagnosis Date  . Hyperlipidemia   . Thyroid disease     hypo  . CHF (congestive heart failure)   . Mitral regurgitation   . Tricuspid regurgitation   . Anemia   . UTI (lower urinary tract infection)   . Arthritis     Osteoarthtistis  . Elevated glucose   . CVA (cerebral infarction)   . Vitamin d deficiency   . Hypothyroidism   . Myocardial infarction   . Chronic kidney failure     stage 3  . CAD (coronary artery disease)     sees Dr. Ricka Burdock last 03/2011  . Hypertension     Dr. Eden Emms cardiologist, Dr. Cheron Schaumann primary doctor   Past Surgical History:  Past Surgical History  Procedure Date  . Abdominal hysterectomy   . Back surgery   . Coronary stent placement   . Laparotomy 06/23/2011    Procedure: EXPLORATORY LAPAROTOMY;  Surgeon: Barnett Abu, MD;  Location: [facility];  Service: Neurosurgery;  Laterality: Bilateral;   HPI:  78 y.o. female admitted 06/23/2011 with low back pain, underwent bilateral laminotomies of L2 and L3. Patient developed acute arterial bleeding with vascular surgery and underwent repair of avulsed lumbar artery. On 06/25/11 noted right-sided neglect. MRI of the brain showed multiple acute-subacute nonhemorrhagic small infarcts throughout the supratentorial and infratentorial region. Question watershed type infarcts with embolic infarcts not excluded. Patient has currently been consuming a dysphagia 3 diet with nectar thick liquids. Repeat MBS ordered to determine potential to advance to thin liquids.      Recommendation/Prognosis  Patient presents with a mild primarily pharyngeal phase dysphagia characterized by a delay in swallow initiation, ? due to decreased sensation vs  slightly decreased oral control, resulting in penetration and aspiration of thin liquids via straw only. Aspirates trace and moved from below to above vocal cords within the swallow. Additionally, penetration/aspiration was prevented with small controlled cup sips. Mild generalized weakness of base of tongue results in very slight vallecular residue post swallow which clears spontaneously. Patient judged safe at this time to advance to a dysphagia 3 (patient c/o dry solids) and thin liquids.  Swallow Evaluation Recommendations Diet Recommendations: Dysphagia 3 (Mechanical Soft);Thin liquid Liquid Administration via: Cup;No straw Medication Administration: Crushed with puree Supervision: Full supervision/cueing for compensatory strategies Compensations: Slow rate;Small sips/bites Postural Changes and/or Swallow Maneuvers: Seated upright 90 degrees Oral Care Recommendations: Oral care BID Follow up Recommendations: Home health SLP Prognosis Prognosis for Safe Diet Advancement: Good Individuals Consulted Consulted and Agree with Results and Recommendations: Patient;RN   SLP Goals  SLP Swallowing Goals Patient will consume recommended diet without observed clinical signs of aspiration with: Supervision/safety Swallow Study Goal #1 - Progress: Progressing toward goal Patient will utilize recommended strategies during swallow to increase swallowing safety with: Supervision/safety Swallow Study Goal #2 - Progress: Progressing toward goal  General:  Date of Onset: 06/23/11 HPI: 78 y.o. female admitted 06/23/2011 with low back pain, underwent bilateral laminotomies of L2 and L3. Patient developed acute arterial bleeding with vascular surgery and underwent repair of avulsed lumbar artery. On 06/25/11 noted right-sided neglect. MRI of the brain showed multiple acute-subacute nonhemorrhagic small infarcts throughout the supratentorial and infratentorial region. Question watershed type infarcts with  embolic infarcts not excluded. Patient has currently  been consuming a dysphagia 3 diet with nectar thick liquids. Repeat MBS ordered to determine potential to advance to thin liquids.  Type of Study: Modified Barium Swallowing Study Previous Swallow Assessment: MBS 5/3-recommended dysphagia 3, nectar thick liquids Diet Prior to this Study: Dysphagia 3 (soft);Nectar-thick liquids Temperature Spikes Noted: No Respiratory Status: Room air History of Intubation: Yes Length of Intubations (days): 1 days Date extubated: 06/23/11 Behavior/Cognition: Alert;Cooperative Oral Cavity - Dentition: Adequate natural dentition Oral Motor / Sensory Function: Within functional limits Vision: Functional for self-feeding Patient Positioning: Upright in chair Baseline Vocal Quality: Clear Volitional Cough: Strong Volitional Swallow: Able to elicit Anatomy: Within functional limits Pharyngeal Secretions: Not observed secondary MBS  Reason for Referral:  Possible advancement of diet  Oral Phase Oral Preparation/Oral Phase Oral Phase: Impaired Oral - Nectar Oral - Nectar Cup:  (slight oral delay) Oral - Thin Oral - Thin Cup:  (slight oral delay) Oral - Thin Straw:  (slight oral delay) Oral - Solids Oral - Puree:  (slight oral delay) Oral - Mechanical Soft:  (slight oral delay) Oral - Pill:  (unable to orally transit with puree or thin liquids,spit out) Pharyngeal Phase  Pharyngeal Phase Pharyngeal Phase: Impaired Pharyngeal - Nectar Pharyngeal - Nectar Cup: Pharyngeal residue - valleculae;Reduced tongue base retraction Pharyngeal - Thin Pharyngeal - Thin Cup: Pharyngeal residue - valleculae;Reduced tongue base retraction Penetration/Aspiration details (thin cup): Material enters airway, passes BELOW cords without attempt by patient to eject out (silent aspiration);Material enters airway, CONTACTS cords and not ejected out Pharyngeal - Thin Straw: Pharyngeal residue - valleculae;Reduced tongue base  retraction;Penetration/Aspiration before swallow;Trace aspiration (trace aspirates clear from below to above cords ) Penetration/Aspiration details (thin straw): Material enters airway, passes BELOW cords then ejected out (during the swallow) Pharyngeal - Solids Pharyngeal - Puree: Within functional limits Pharyngeal - Mechanical Soft: Within functional limits Cervical Esophageal Phase  Cervical Esophageal Phase Cervical Esophageal Phase: Impaired Cervical Esophageal Phase - Thin Thin Cup:  (intermittent trace backflow of bolus noted x1-2 only) Ferdinand Lango MA, CCC-SLP [phone]  Alonah Lineback Meryl 07/15/2011, 10:24 AM

## 2011-07-16 NOTE — Patient Care Conference (Signed)
Inpatient RehabilitationTeam Conference Note Date: 07/15/2011   Time: 3:25 PM    Patient Name: Jean [name]      Medical Record Number: [id number]  Date of Birth: 02-13-1929 Sex: Female         Room/Bed: 4155/4155-01 Payor Info: Payor: MEDICARE  Plan: MEDICARE PART A AND B  Product Type: *No Product type*     Admitting Diagnosis: Decompf/ fusion, new cva  Admit Date/Time:  07/02/2011  4:37 PM Admission Comments: No comment available   Primary Diagnosis:  Lumbar radiculopathy Principal Problem: Lumbar radiculopathy  Patient Active Problem List  Diagnoses Date Noted  . Lumbar radiculopathy 07/03/2011  . Pulmonary edema 06/30/2011  . Hypokalemia 06/30/2011  . Delirium, acute 06/25/2011  . Status post lumbar surgery 06/23/2011  . Aortic injury, S/P vascular surgery 06/23/2011  . ARF (acute renal failure) 06/23/2011  . LBP (low back pain) 06/04/2011  . Hip pain 03/05/2011  . UNSPECIFIED CLOSED FRACTURE OF ANKLE 05/16/2010  . ANKLE SPRAIN, RIGHT 05/16/2010  . WRIST PAIN 05/14/2010  . ANKLE PAIN 05/14/2010  . FIBROCYSTIC BREAST DISEASE 03/14/2010  . CORONARY ATHEROSCLEROSIS NATIVE CORONARY ARTERY 11/15/2009  . GASTROENTERITIS 06/05/2009  . SYNCOPE 06/05/2009  . INGROWN TOENAIL 02/05/2009  . RENAL INSUFFICIENCY 12/13/2007  . VITAMIN D DEFICIENCY 08/11/2007  . CRAMPS,LEG 06/08/2007  . COUGH 05/26/2007  . HYPERLIPIDEMIA 12/28/2006  . HYPERTENSION 12/28/2006  . CORONARY ARTERY DISEASE 12/28/2006  . OSTEOARTHRITIS 12/28/2006  . HYPOTHYROIDISM, UNSPECIFIED 12/23/2006    Expected Discharge Date: Expected Discharge Date: 07/23/11 (possible SNF)  Team Members Present: Physician: Dr. Faith Rogue Case Manager Present: Melanee Spry, RN Social Worker Present: Amada Jupiter, LCSW Nurse Present: Rosalio Macadamia, RN PT Present: Other (comment) Sherrine Maples) OT Present: Roney Mans, OT;Ardis Rowan, Sherryl Manges, OT SLP Present: Feliberto Gottron, SLP     Current Status/Progress Goal  Weekly Team Focus  Medical   improved arousal and awareness. still delayed.. uti rx'ed, not eating much  see prior,  voiding trial?   Bowel/Bladder   Foley catheter intact.  Incontinent of bowel at times-briefs in use.  Bowel and bladder will be managed with timed toileting/brief.      Swallow/Nutrition/ Hydration   Dys. 3 textures, thin liquids, Mod A for initiation and utilization of swallowing strategies  Supervision  initiation with meal and utilization of strategies    ADL's   mod A UB, total LB, sitting balance min A, sit to stand pulling up on something mod A, toileting total A, transfers scoot pivot mod A  min A overall  sit to stands, transfers, toileting, attention   Mobility   mod At transfers and gait with RW up to 40 ft, poor problem solving and does not recall back precautions  min A  activity tolerance, gait, posture   Communication             Safety/Cognition/ Behavioral Observations  Max A  Min A  initiation, attention, memory and problem solving   Pain   Denies pain.  Pain </=2      Skin   Midline abdomen incision with staples; back incision with dermabond; blanchable redness to buttocks and bilateral heels.  Incisions remain free of infection, no new skin breakdown         *See Interdisciplinary Assessment and Plan and progress notes for long and short-term goals  Barriers to Discharge: cognitiion, pain    Possible Resolutions to Barriers:  see prior    Discharge Planning/Teaching Needs:  home with husband as primary caregiver,  but very aware that he will likely need to assistance of his daughter as well.      Team Discussion: Slow progress--will need physical assist at d/c.  Daughter expressing concern about how parents will manage at home.  Pt upgraded to thin liquids.  Foley d/c'd & scheduled toileting planned.   Revisions to Treatment Plan: none    Continued Need for Acute Rehabilitation Level of Care: The patient requires daily medical management  by a physician with specialized training in physical medicine and rehabilitation for the following conditions: Daily direction of a multidisciplinary physical rehabilitation program to ensure safe treatment while eliciting the highest outcome that is of practical value to the patient.: Yes Daily medical management of patient stability for increased activity during participation in an intensive rehabilitation regime.: Yes Daily analysis of laboratory values and/or radiology reports with any subsequent need for medication adjustment of medical intervention for : Neurological problems;Post surgical problems;Other  Brock Ra 07/16/2011, 3:15 PM

## 2011-07-16 NOTE — Plan of Care (Signed)
Problem: RH BLADDER ELIMINATION Goal: RH STG MANAGE BLADDER WITH EQUIPMENT WITH ASSISTANCE STG Manage Bladder With Equipment With Max Assistance  Outcome: Not Applicable Date Met:  07/16/11 Foley has been discontinued.

## 2011-07-16 NOTE — Progress Notes (Signed)
Physical Therapy Note  Patient Details  Name: KAJA JACKOWSKI MRN: [id number] Date of Birth: 05-24-1928 Today's Date: 07/16/2011  Time: 900-956 56 minutes  No c/o pain.  Scoot/squat pivot transfers bed <> w/c with min A today.  Mod cuing for initiation and sequencing.  Gait training with RW 2 x 20' with mod A.  Pt requires manual facilitation for wt shifts to R as pt is hesitant to progress L LE.  Pt requires mod cuing for attention to gait task and mod A for control of RW.  Supine <> sit training multiple reps with focus on log roll technique, pt able to recall with min cuing.  Pt min A for sit to supine, mod A for supine to sit.  Pt requires mod cuing for attention to task.  Stair training for activity tolerance and strengthening.  Pt negotiated 2 stairs x 2 with mod A, B hand rails.  Pt limited this session by decreased sustained attention and decreased activity tolerance, requiring frequent rest breaks.  Pt with improved overall activity tolerance as she is able to engage in full PT session.  Individual therapy    Briauna Gilmartin 07/16/2011, 10:00 AM

## 2011-07-16 NOTE — Progress Notes (Signed)
Occupational Therapy Session Note  Patient Details  Name: Jean [name] MRN: [id number] Date of Birth: [birth year]  Today's Date: 07/16/2011 Time: 0730-0830 Time Calculation (min): 60 min  Short Term Goals: Week 2:  OT Short Term Goal 1 (Week 2): Pt will demonstrate sustained attention for 3 min with mod A in a familar functional task OT Short Term Goal 2 (Week 2): Pt will don shirt with steadying A in sitting EOB OT Short Term Goal 3 (Week 2): Pt will transfer to toilet with min A OT Short Term Goal 4 (Week 2): Pt will perform sit to stand for clothing managment with max +1 OT Short Term Goal 5 (Week 2): Pt demonstrate intellecual awareness with mod A in a functional task  Skilled Therapeutic Interventions/Progress Updates:    1:1 self care retraining including bed mobility with min A to get to EOB with min cuing, transferred with RW bed to [facility] with mod A with max cuing (tactile) for turning, sit to stand with RW for hygiene and to don brief with min to mod A. Transferred to w/c to perform dressing. Pt able to don shirt with max cuing for attention to task (pt don shirt over head with decreased awareness shirt was not on her chest or arms. Able to don pants with crossing one leg to thread them and then using a reacher to thread the other leg with min A and directional cuing, sit to stand with min A to pull them up. Increased ability to setup toothbrush with toothpaste adn perform tasks with minimal cuing. Worked on setting up breakfast- fixing coffee and putting butter and syrup on pancake with min cuing with extra time.  2nd session 1:1 10:15-10:45 focus on therapeutic activity focusing on sit to stand and standing balance and tolerance. Task was going through each of her greeting cards and sorting them into a "mother's day" or "get well" type of card. Pt needed max to total A to read card to see who it was from and what type of card it was. Sit to stands varied from min to mod and tolerated  standing for 30-45 seconds.  Therapy Documentation Precautions:  Precautions Precautions: Fall;Back Precaution Comments: required total A for recall of 3/3 precautions Required Braces or Orthoses: Spinal Brace Spinal Brace: Lumbar corset Restrictions Weight Bearing Restrictions: No Pain:  mild c/o pain in right side (relief with rest )  See FIM for current functional status  Therapy/Group: Individual Therapy  Roney Mans [facility] 07/16/2011, 3:30 PM

## 2011-07-16 NOTE — Progress Notes (Signed)
Subjective/Complaints: No new complaints Ros performed and otherwise negative  Objective: Vital Signs: Blood pressure 161/71, pulse 60, temperature 98.2 F (36.8 C), temperature source Oral, resp. rate 18, height 5\' 7"  (1.702 m), weight 67 kg (147 lb 11.3 oz), SpO2 98.00%. Dg Swallowing Func-no Report  07/15/2011  CLINICAL DATA: to assess for possible upgrade to thin liqiuds   FLUOROSCOPY FOR SWALLOWING FUNCTION STUDY:  Fluoroscopy was provided for swallowing function study, which was  administered by a speech pathologist.  Final results and recommendations  from this study are contained within the speech pathology report.     No results found for this basename: WBC:2,HGB:2,HCT:2,PLT:2 in the last 72 hours  Basename 07/14/11 0641  NA 140  K 4.4  CL 103  CO2 25  GLUCOSE 99  BUN 27*  CREATININE 1.57*  CALCIUM 9.4   CBG (last 3)  No results found for this basename: GLUCAP:3 in the last 72 hours  Wt Readings from Last 3 Encounters:  07/09/11 67 kg (147 lb 11.3 oz)  07/02/11 77.8 kg (171 lb 8.3 oz)  07/02/11 77.8 kg (171 lb 8.3 oz)    Physical Exam:  General appearance: alert, cooperative, appears stated age and no distress Head: Normocephalic, without obvious abnormality, atraumatic Eyes: conjunctivae/corneas clear. PERRL, EOM's intact.  Ears: normal external ear canals both ears Nose: Nares normal. Septum midline. Mucosa normal. No drainage or sinus tenderness. Throat: lips, mucosa, and tongue normal; teeth and gums normal Neck: no adenopathy, no carotid bruit, no JVD, supple, symmetrical, trachea midline and thyroid not enlarged, symmetric, no tenderness/mass/nodules Back: tender superior to incision. posture poor.  Resp: clear to auscultation bilaterally, no obvious wheezes or rales Cardio: regular rate and rhythm, S1, S2 normal, no murmur, click, rub or gallop GI: soft, non-tender; bowel sounds normal; no masses,  no organomegaly Extremities: extremities normal,  atraumatic, no cyanosis or edema Pulses: 1+ edema lue Skin: Skin color, texture, turgor normal. No rashes or lesions Neurologic: fairly alert. Gross, generalized weakness--let more than right (inconsistent) no focal CN findings. Limited insight and awareness. 4/5 Strength BUE and BLE Incision/Wound: clean and intact and essentially closed   Assessment/Plan: 1. Functional deficits secondary to lumbar stenosis, radiculopathy, with post op watershed infarct which require 3+ hours per day of interdisciplinary therapy in a comprehensive inpatient rehab setting. Physiatrist is providing close team supervision and 24 hour management of active medical problems listed below. Physiatrist and rehab team continue to assess barriers to discharge/monitor patient progress toward functional and medical goals. FIM: FIM - Bathing Bathing Steps Patient Completed: Chest;Right Arm;Left Arm;Abdomen Bathing: 2: Max-Patient completes 3-4 93f 10 parts or 25-49%  FIM - Upper Body Dressing/Undressing Upper body dressing/undressing steps patient completed: Thread/unthread right sleeve of pullover shirt/dresss;Put head through opening of pull over shirt/dress;Thread/unthread left sleeve of pullover shirt/dress;Pull shirt over trunk Upper body dressing/undressing: 4: Steadying assist FIM - Lower Body Dressing/Undressing Lower body dressing/undressing steps patient completed: Thread/unthread right pants leg;Don/Doff right shoe;Don/Doff left sock Lower body dressing/undressing: 2: Max-Patient completed 25-49% of tasks  FIM - Toileting Toileting: 1: Two helpers  FIM - Diplomatic Services operational officer Devices: Bedside commode;Grab bars Toilet Transfers: 1-Two helpers  FIM - Banker Devices: Arm rests Bed/Chair Transfer: 3: Supine > Sit: Mod A (lifting assist/Pt. 50-74%/lift 2 legs;3: Bed > Chair or W/C: Mod A (lift or lower assist)  FIM - Locomotion:  Wheelchair Distance: 300 Locomotion: Wheelchair: 1: Total Assistance/staff pushes wheelchair (Pt<25%) FIM - Locomotion: Ambulation Locomotion: Ambulation Assistive Devices:  Walker - Rolling Ambulation/Gait Assistance: 3: Mod assist Locomotion: Ambulation: 1: Travels less than 50 ft with maximal assistance (Pt: 25 - 49%)  Comprehension Comprehension Mode: Auditory Comprehension: 3-Understands basic 50 - 74% of the time/requires cueing 25 - 50%  of the time  Expression Expression Mode: Verbal Expression: 2-Expresses basic 25 - 49% of the time/requires cueing 50 - 75% of the time. Uses single words/gestures.  Social Interaction Social Interaction: 4-Interacts appropriately 75 - 89% of the time - Needs redirection for appropriate language or to initiate interaction.  Problem Solving Problem Solving: 2-Solves basic 25 - 49% of the time - needs direction more than half the time to initiate, plan or complete simple activities  Memory Memory: 2-Recognizes or recalls 25 - 49% of the time/requires cueing 51 - 75% of the time  1. Lumbar stenosis with radiculopathy. Status post bilateral laminotomies with decompression and removal of previous hardware. Complicated by acute arterial bleeding with vascular repair 06/23/2011  2. 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 Continue aspirin Plavix. Carotid Dopplers are pending  3. DVT Prophylaxis/Anticoagulation: SCDs. Monitor for any signs of deep vein thrombosis  4. Hypertension. aldomet 250 mg twice a day, Lopressor 150 mg twice a day. bp boderline at times. 5. Hypothyroidism. Synthroid  6. Mood/postoperative confusion. Improved with limited insight and awareness 7. FEN: mild renal insufficiency  -push po fluids  -BUN and CR generally stable  -continue to hold lasix  -recheck bmet friday  -added megace for appetite- increased to bid- only ate 25 percent yesterday  -fluid upgrade this week. mbs per slp. Upgrade  may help 8.  CHF exacerbation EF is 40-45%- lasix added with good results but held   -monitor i's and o's as well as renal fxn  9. E COli UTI- on keflex currently for a total of 7 days of abx  -incontinent with low pvr's- timed voids LOS (Days) 14 A FACE TO FACE EVALUATION WAS PERFORMED  Jean [name] T 07/16/2011, 8:59 AM

## 2011-07-16 NOTE — Care Management Note (Signed)
Per State Regulation 482.30 This chart was reviewed for medical necessity with respect to the patient's Admission/Duration of stay. Team conf report given to pt, husband & 2 daughters: ELOS 07/23/11 goals min assist-occ mod assist. Daughters express wish to consider SNF to allow pt to continue daily therapy for longer time w/ potential of reaching more independent goals.  Pt & husband are agreeable to this plan.     Brock Ra                 Nurse Care Manager              Next Review Date: 07/18/11

## 2011-07-16 NOTE — Progress Notes (Signed)
Speech Language Pathology Daily Session Note  Patient Details  Name: Jean [name] MRN: [id number] Date of Birth: Dec 13, 1928  Today's Date: 07/16/2011 Time: 1115-1200 Time Calculation (min): 45 min  Short Term Goals: SLP Short Term Goal 1 (Week 2): Pt will consume trials of thin liquids without overt s/s of aspiration with supervision. SLP Short Term Goal 1 - Progress (Week 2): Met (MBS complete-recommended thin liquids) SLP Short Term Goal 2 (Week 2): Pt will identify 1 physical and 1 cognitive deficit with Mod A verbal and semantic cues. SLP Short Term Goal 3 (Week 2): Pt will initiate functional tasks with Min A verbal and visual cues. SLP Short Term Goal 3 - Progress (Week 2): Progressing toward goal SLP Short Term Goal 4 (Week 2): Pt will utilize visual aids  (schedule, calendar, etc.) to recall daily information with mod A semantic and verbal cues.  SLP Short Term Goal 5 (Week 2): Pt will sustain attention to a functional task for 2 mins with Max A verbal and tactile cues.  Skilled Therapeutic Interventions: Treatment focus on functional problem solving, sustained attention and initiation. SLP facilitated by utilizing a sorting task from field of 2 and required Max A verbal and question cues for initiation of task and for functional problem solving with task. Pt also required Max A verbal and visual cues for sustained attention to task.    Daily Session FIM:  Comprehension Comprehension Mode: Auditory Comprehension: 3-Understands basic 50 - 74% of the time/requires cueing 25 - 50%  of the time Expression Expression: 2-Expresses basic 25 - 49% of the time/requires cueing 50 - 75% of the time. Uses single words/gestures. Social Interaction Social Interaction: 3-Interacts appropriately 50 - 74% of the time - May be physically or verbally inappropriate. Problem Solving Problem Solving: 2-Solves basic 25 - 49% of the time - needs direction more than half the time to initiate, plan or  complete simple activities Memory Memory: 2-Recognizes or recalls 25 - 49% of the time/requires cueing 51 - 75% of the time Pain: No/Denies Pain   Therapy/Group: Individual Therapy  Jean [name] 07/16/2011, 12:29 PM

## 2011-07-17 NOTE — Progress Notes (Signed)
Patient ID: RAYMONDE HAMBLIN, female   DOB: 17-Oct-1928, 78 y.o.   MRN: [id number] Subjective/Complaints: More confused this am. Ros performed and otherwise negative  Objective: Vital Signs: Blood pressure 152/79, pulse 76, temperature 98.3 F (36.8 C), temperature source Oral, resp. rate 18, height 5\' 7"  (1.702 m), weight 65.545 kg (144 lb 8 oz), SpO2 97.00%. Dg Swallowing Func-no Report  07/15/2011  CLINICAL DATA: to assess for possible upgrade to thin liqiuds   FLUOROSCOPY FOR SWALLOWING FUNCTION STUDY:  Fluoroscopy was provided for swallowing function study, which was  administered by a speech pathologist.  Final results and recommendations  from this study are contained within the speech pathology report.     No results found for this basename: WBC:2,HGB:2,HCT:2,PLT:2 in the last 72 hours No results found for this basename: NA:2,K:2,CL:2,CO2:2,GLUCOSE:2,BUN:2,CREATININE:2,CALCIUM:2 in the last 72 hours CBG (last 3)  No results found for this basename: GLUCAP:3 in the last 72 hours  Wt Readings from Last 3 Encounters:  07/16/11 65.545 kg (144 lb 8 oz)  07/02/11 77.8 kg (171 lb 8.3 oz)  07/02/11 77.8 kg (171 lb 8.3 oz)    Physical Exam:  General appearance: alert, cooperative, appears stated age and no distress Head: Normocephalic, without obvious abnormality, atraumatic Eyes: conjunctivae/corneas clear. PERRL, EOM's intact.  Ears: normal external ear canals both ears Nose: Nares normal. Septum midline. Mucosa normal. No drainage or sinus tenderness. Throat: lips, mucosa, and tongue normal; teeth and gums normal Neck: no adenopathy, no carotid bruit, no JVD, supple, symmetrical, trachea midline and thyroid not enlarged, symmetric, no tenderness/mass/nodules Back: tender superior to incision. posture poor.  Resp: clear to auscultation bilaterally, no obvious wheezes or rales Cardio: regular rate and rhythm, S1, S2 normal, no murmur, click, rub or gallop GI: soft, non-tender; bowel  sounds normal; no masses,  no organomegaly Extremities: extremities normal, atraumatic, no cyanosis or edema Pulses: 1+ edema lue Skin: Skin color, texture, turgor normal. No rashes or lesions Neurologic: fairly alert. Gross, generalized weakness--let more than right (inconsistent) no focal CN findings. Limited insight and awareness. Oriented to place. Follows simple commands. 4/5 Strength BUE and BLE Incision/Wound: clean and intact and essentially closed   Assessment/Plan: 1. Functional deficits secondary to lumbar stenosis, radiculopathy, with post op watershed infarct which require 3+ hours per day of interdisciplinary therapy in a comprehensive inpatient rehab setting. Physiatrist is providing close team supervision and 24 hour management of active medical problems listed below. Physiatrist and rehab team continue to assess barriers to discharge/monitor patient progress toward functional and medical goals. FIM: FIM - Bathing Bathing Steps Patient Completed: Chest;Right Arm;Left Arm;Abdomen Bathing: 2: Max-Patient completes 3-4 65f 10 parts or 25-49%  FIM - Upper Body Dressing/Undressing Upper body dressing/undressing steps patient completed: Thread/unthread right sleeve of pullover shirt/dresss;Put head through opening of pull over shirt/dress;Thread/unthread left sleeve of pullover shirt/dress;Pull shirt over trunk Upper body dressing/undressing: 4: Steadying assist FIM - Lower Body Dressing/Undressing Lower body dressing/undressing steps patient completed: Thread/unthread right pants leg;Don/Doff right shoe;Don/Doff left sock Lower body dressing/undressing: 2: Max-Patient completed 25-49% of tasks  FIM - Toileting Toileting: 1: Two helpers  FIM - Diplomatic Services operational officer Devices: Psychiatrist Transfers: 1-Two helpers  FIM - Banker Devices: Arm rests Bed/Chair Transfer: 4: Chair or W/C > Bed: Min A (steadying  Pt. > 75%);3: Supine > Sit: Mod A (lifting assist/Pt. 50-74%/lift 2 legs  FIM - Locomotion: Wheelchair Distance: 300 Locomotion: Wheelchair: 1: Total Assistance/staff pushes wheelchair (Pt<25%) FIM - Locomotion: Ambulation  Locomotion: Ambulation Assistive Devices: Designer, industrial/product Ambulation/Gait Assistance: 3: Mod assist Locomotion: Ambulation: 1: Travels less than 50 ft with moderate assistance (Pt: 50 - 74%)  Comprehension Comprehension Mode: Auditory Comprehension: 3-Understands basic 50 - 74% of the time/requires cueing 25 - 50%  of the time  Expression Expression Mode: Verbal Expression: 2-Expresses basic 25 - 49% of the time/requires cueing 50 - 75% of the time. Uses single words/gestures.  Social Interaction Social Interaction: 3-Interacts appropriately 50 - 74% of the time - May be physically or verbally inappropriate.  Problem Solving Problem Solving: 2-Solves basic 25 - 49% of the time - needs direction more than half the time to initiate, plan or complete simple activities  Memory Memory: 2-Recognizes or recalls 25 - 49% of the time/requires cueing 51 - 75% of the time  1. Lumbar stenosis with radiculopathy. Status post bilateral laminotomies with decompression and removal of previous hardware. Complicated by acute arterial bleeding with vascular repair 06/23/2011  2. 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 Continue aspirin Plavix. Carotid Dopplers are pending  3. DVT Prophylaxis/Anticoagulation: SCDs. Monitor for any signs of deep vein thrombosis  4. Hypertension. aldomet 250 mg twice a day, Lopressor 150 mg twice a day. bp boderline at times. 5. Hypothyroidism. Synthroid  6. Mood/postoperative confusion. Improved with limited insight and awareness 7. FEN: mild renal insufficiency  -push po fluids  -BUN and CR generally stable  -continue to hold lasix  -recheck bmet friday  -added megace for appetite- increased to bid- only ate 25  percent yesterday  -fluid upgrade this week. mbs per slp. Upgrade may help 8.  CHF exacerbation EF is 40-45%- lasix added with good results but held   -monitor i's and o's as well as renal fxn  9. E COli UTI- keflex  -timed voiding to improve continence  -recheck ucx today LOS (Days) 15 A FACE TO FACE EVALUATION WAS PERFORMED  Inessa Wardrop T 07/17/2011, 8:36 AM

## 2011-07-17 NOTE — Progress Notes (Signed)
Physical Therapy Weekly Progress Note  Patient Details  Name: Jean [name] MRN: [id number] Date of Birth: 12-Apr-1928  Today's Date: 07/17/2011  Patient has met 2 of 3 short term goals.  Pt has improved functional mobility, activity tolerance and balance as she is now able to transfer with min A and gait short distances with RW.  Pt requires mod-max A for standing balance during functional activities.  Patient continues to demonstrate the following deficits: decreased attention, decreased awareness, impaired balance, decreased coordination, decreased motor planning, delayed initiation, decreased strength, decreased activity tolerance and therefore will continue to benefit from skilled PT intervention to enhance overall performance with activity tolerance, balance, ability to compensate for deficits, attention, awareness, coordination and knowledge of precautions.  Patient not progressing toward long term goals.  See goal revision..  Continue plan of care.  PT Short Term Goals Week 2:  PT Short Term Goal 1 (Week 2): Pt will consistantly transfer bed <> chair with min A PT Short Term Goal 1 - Progress (Week 2): Met PT Short Term Goal 2 (Week 2): Pt will gait 10' with max A PT Short Term Goal 2 - Progress (Week 2): Met PT Short Term Goal 3 (Week 2): Pt wil maintain static standing balance with mod A for functional activity PT Short Term Goal 3 - Progress (Week 2): Progressing toward goal PT Short Term Goal 4 (Week 2): Pt will sustain attention to functional activity x 2 mins with min A PT Short Term Goal 4 - Progress (Week 2): Progressing toward goal  Skilled Therapeutic Interventions/Progress Updates:  Ambulation/gait training;Balance/vestibular training;Discharge planning;Cognitive remediation/compensation;Functional mobility training;Patient/family education;Pain management;DME/adaptive equipment instruction;Neuromuscular re-education;Stair training;Therapeutic Activities;UE/LE Coordination  activities;Wheelchair propulsion/positioning;UE/LE Strength taining/ROM;Therapeutic Exercise    See FIM for current functional status    Jean [name] 07/17/2011, 3:36 PM

## 2011-07-17 NOTE — Progress Notes (Signed)
Occupational Therapy Session Note  Patient Details  Name: Jean [name] MRN: [id number] Date of Birth: [birth year]  Today's Date: 07/17/2011 Time: 0730-0830 Time Calculation (min): 60 min  Short Term Goals: Week 2:  OT Short Term Goal 1 (Week 2): Pt will demonstrate sustained attention for 3 min with mod A in a familar functional task OT Short Term Goal 2 (Week 2): Pt will don shirt with steadying A in sitting EOB OT Short Term Goal 3 (Week 2): Pt will transfer to toilet with min A OT Short Term Goal 4 (Week 2): Pt will perform sit to stand for clothing managment with max +1 OT Short Term Goal 5 (Week 2): Pt demonstrate intellecual awareness with mod A in a functional task  Skilled Therapeutic Interventions/Progress Updates:    1:1 self care retraining with focus on dressing and grooming. This am pt disoriented to place and situation; referring to herself have had moved to a different place- difficult comprehending redirection and accepting she is in the hospital and it is the same place as yesterday. When transferring bed to [facility] with RW pt had an accident with bladder but pt unaware. Focused on sustained attention to complete 1 step tasks with min to max cuing (contextual and verbal). Pt internally distracted throughout session   Therapy Documentation Precautions:  Precautions Precautions: Fall;Back Precaution Comments: required total A for recall of 3/3 precautions Required Braces or Orthoses: Spinal Brace Spinal Brace: Lumbar corset Restrictions Weight Bearing Restrictions: No Pain:  no c/o pain  See FIM for current functional status  Therapy/Group: Individual Therapy  Roney Mans [facility] 07/17/2011, 11:43 AM

## 2011-07-17 NOTE — Progress Notes (Signed)
Speech Language Pathology Daily Session Note  Patient Details  Name: Jean [name] MRN: [id number] Date of Birth: 1929/01/30  Today's Date: 07/17/2011 Time: 1100-1155 Time Calculation (min): 55 min  Short Term Goals:  SLP Short Term Goal 1 (Week 2): Pt will consume trials of thin liquids without overt s/s of aspiration with supervision. SLP Short Term Goal 1 - Progress (Week 2): Met (MBS complete-recommended thin liquids) SLP Short Term Goal 2 (Week 2): Pt will identify 1 physical and 1 cognitive deficit with Mod A verbal and semantic cues. SLP Short Term Goal 3 (Week 2): Pt will initiate functional tasks with Min A verbal and visual cues. SLP Short Term Goal 3 - Progress (Week 2): Progressing toward goal SLP Short Term Goal 4 (Week 2): Pt will utilize visual aids  (schedule, calendar, etc.) to recall daily information with mod A semantic and verbal cues.  SLP Short Term Goal 5 (Week 2): Pt will sustain attention to a functional task for 2 mins with Max A verbal and tactile cues.  Skilled Therapeutic Interventions: Treatment focus on attention, initiation and problem solving. Pt required Total-Max A verbal, visual, and semantic cues for sequencing 4 step picture cards with basic, functional tasks. Pt also required Max A verbal and tactile cues for sustained attention to task due to fatigue. Pt reports she is tired due to "getting in late last night from the baseball game." Pt easily reoriented to place and could independently recall current situation.   Daily Session FIM:  Comprehension Comprehension Mode: Auditory Comprehension: 3-Understands basic 50 - 74% of the time/requires cueing 25 - 50%  of the time Expression Expression Mode: Verbal Expression: 2-Expresses basic 25 - 49% of the time/requires cueing 50 - 75% of the time. Uses single words/gestures. Social Interaction Social Interaction: 2-Interacts appropriately 25 - 49% of time - Needs frequent redirection. Problem  Solving Problem Solving: 2-Solves basic 25 - 49% of the time - needs direction more than half the time to initiate, plan or complete simple activities Memory Memory: 2-Recognizes or recalls 25 - 49% of the time/requires cueing 51 - 75% of the time FIM - Eating Eating Activity: 5: Supervision/cues Pain No/Denies Pain  Therapy/Group: Individual Therapy  Quinn Bartling 07/17/2011, 12:00 PM

## 2011-07-17 NOTE — Progress Notes (Signed)
Physical Therapy Note  Patient Details  Name: Jean [name] MRN: [id number] Date of Birth: Jul 21, 1928 Today's Date: 07/17/2011  Time: 1300-1330 30 minutes  No c/o pain.  Pt found asleep in w/c in room.  Required increased time to arouse.  Bathroom mobility and transfers with RW with mod-max A.  Pt required increased manual and verbal cues for sequencing and motor planning this afternoon.  Required +2 for hygiene and pulling up clothing after toileting.  Pt with increased posterior lean and decreased initiation this pm.  Bed mobility with min A, increased time, cues for sequencing log roll technique.  Pt continues to be limited by decreased attention, initiation and motor planning/sequencing abilities.  Individual therapy   Qasim Diveley 07/17/2011, 3:28 PM

## 2011-07-17 NOTE — Progress Notes (Signed)
Physical Therapy Note  Patient Details  Name: Jean [name] MRN: [id number] Date of Birth: December 18, 1928 Today's Date: 07/17/2011  Time: [phone] 60 minutes  No c/o pain.  Treatment session focused on sustained attention, problem solving and initiation in functional tasks.  Car transfers with RW multiple attempts with pt requiring mod-max cues each time for safety and sequencing, pt frequently attempting to sit before positioned correctly, decreased initiation and frequent attempts of "freezing" during gait ? Due to decreased sustained attention.  Pt performed gait to/from car with RW 15' x 2 taking 7-8 minutes to gait 15' each time.  Gait training 20' x 2 with focus on safely turning to sit with pt requiring mod manual cues and mod-max verbal cues for safe sequencing and motor coordination to turn to sit.  W/c mobility in controlled environment initially min A but pt able to progress to supervision for 55' on controlled, straight path.  Pt limited by decreased sustained attention < 60 seconds to all functional tasks.  Individual therapy   [name] 07/17/2011, 10:03 AM

## 2011-07-18 NOTE — Progress Notes (Signed)
Occupational Therapy Weekly Progress Note  Patient Details  Name: Jean [name] MRN: [id number] Date of Birth: 04-Jan-1929  Today's Date: 07/18/2011  Patient has met 4 of 5 short term goals.  Pt continues to make steady progress in therapy this week. Pt can don her shirt EOB with supervision and LB dressing with mod A, toilet transfers with RW to [facility] with min A and max to total for toileting. Pt still with incontinent episodes of urine and bowel.  Showering is not a goal at this time. Pt still requires max A for attention but is able to follow simple one step commands with ample time provided, mod to max for problem solving and max for initiation verbally and with physical mobility   Patient continues to demonstrate the following deficits: muscle weakness, decreased cardiorespiratoy endurance, impaired timing and sequencing, unbalanced muscle activation, decreased coordination and decreased motor planning, decreased motor planning, decreased initiation, decreased attention, decreased awareness, decreased problem solving, decreased safety awareness, decreased memory and delayed processing and decreased standing balance, decreased postural control, decreased balance strategies and difficulty maintaining precautions  and therefore will continue to benefit from skilled OT intervention to enhance overall performance with BADL.  Pt's discharge plan changed to SNF secondary to the level of care she will need and her family is unable to provide this at this time.  Patient progressing toward long term goals..  Continue plan of care.  OT Short Term Goals Week 2:  OT Short Term Goal 1 (Week 2): Pt will demonstrate sustained attention for 3 min with mod A in a familar functional task OT Short Term Goal 1 - Progress (Week 2): Met OT Short Term Goal 2 (Week 2): Pt will don shirt with steadying A in sitting EOB OT Short Term Goal 2 - Progress (Week 2): Met OT Short Term Goal 3 (Week 2): Pt will transfer to  toilet with min A OT Short Term Goal 3 - Progress (Week 2): Met OT Short Term Goal 4 (Week 2): Pt will perform sit to stand for clothing managment with max +1 OT Short Term Goal 4 - Progress (Week 2): Met OT Short Term Goal 5 (Week 2): Pt demonstrate intellecual awareness with mod A in a functional task OT Short Term Goal 5 - Progress (Week 2): Not met Week 3:  OT Short Term Goal 1 (Week 3): Pt will answer a functional question with only 10 sec delay OT Short Term Goal 2 (Week 3): Pt will demonstrate sustained attention for 3 min with mod A OT Short Term Goal 3 (Week 3): Pt will don pants with min A sit to stand OT Short Term Goal 4 (Week 3): Pt will perform 2/3 toileting tasks with min A    Therapy Documentation Precautions:  Precautions Precautions: Fall;Back Precaution Comments: required total A for recall of 3/3 precautions Required Braces or Orthoses: Spinal Brace Spinal Brace: Lumbar corset Restrictions Weight Bearing Restrictions: No  See FIM for current functional status  Therapy/Group: Individual Therapy  Roney Mans [facility] 07/18/2011, 3:27 PM

## 2011-07-18 NOTE — Progress Notes (Signed)
Physical Therapy Note  Patient Details  Name: Jean [name] MRN: [id number] Date of Birth: [birth year] Today's Date: 07/18/2011  Time: [phone]  No c/o pain.  Gait training and functional mobility practiced during automatic tasks to improve pt initiation and sequencing.  Car transfers with RW with min-mod A for gait, mod A for turning, pt required only mod questioning cues for proper motor sequencing for car transfer, improved from max-total cues yesterday.  Gait in home environment with mod A, mod-max cuing for obstacle negotiation with RW.  Multiple reps of supine <> sit pt improved from mod A to supervision with bed mobility!  Pt continues to demonstrate decreased safety awareness, impaired motor planning and sequencing.  Practiced SPT with RW to various pieces of furniture with focus on proper set up for safe sitting, pt continued to require min-mod cues for set up and sequencing.  Pt is improving overall activity tolerance, continues to be limited by decreased cognitive status.  Individual therapy   Dorthia Tout 07/18/2011, 11:08 AM

## 2011-07-18 NOTE — Progress Notes (Signed)
Occupational Therapy Session Note  Patient Details  Name: Jean [name] MRN: [id number] Date of Birth: 1929/01/18  Today's Date: 07/18/2011 Time: 0730-0830 Time Calculation (min): 60 min  Short Term Goals: Week 2:  OT Short Term Goal 1 (Week 2): Pt will demonstrate sustained attention for 3 min with mod A in a familar functional task OT Short Term Goal 2 (Week 2): Pt will don shirt with steadying A in sitting EOB OT Short Term Goal 3 (Week 2): Pt will transfer to toilet with min A OT Short Term Goal 4 (Week 2): Pt will perform sit to stand for clothing managment with max +1 OT Short Term Goal 5 (Week 2): Pt demonstrate intellecual awareness with mod A in a functional task  Skilled Therapeutic Interventions/Progress Updates:    1:1 self care retraining focusing on dressing, grooming and setup for eating breakfast. Focus on sustained attention with min to mod cuing, simple problem solving with min cuing with these familiar and basic tasks, sitting balance at EOB with supervision (for all dressing tasks), sit to stand without pulling up on RW - however unable to perform fully upright position requiring total A to maintain bottom clearance from bed, however when able to pull up on RW able to come into full stance with min to mod A for total A for clothing management. Took 5 steps with RW to transfer to w/c with mod A with A to steer and control RW, increased body awareness of not trying to sit before arrive at chair.  Therapy Documentation Precautions:  Precautions Precautions: Fall;Back Precaution Comments: required total A for recall of 3/3 precautions Required Braces or Orthoses: Spinal Brace Spinal Brace: Lumbar corset Restrictions Weight Bearing Restrictions: No Pain: Pain Assessment Pain Score: 0-No pain  See FIM for current functional status  Therapy/Group: Individual Therapy  Roney Mans [facility] 07/18/2011, 11:44 AM

## 2011-07-18 NOTE — Progress Notes (Signed)
Physical Therapy Note  Patient Details  Name: ANNETE AYUSO MRN: [id number] Date of Birth: [birth year] Today's Date: 07/18/2011  Time: 1300-1330 30 minutes  No c/o pain.  With max questioning cues pt able to verbalize need to go to the bathroom.  Gait with mod A to bathroom.  Bathroom mobility and transfers with mod A with RW or grab bars.  Pt requires total assist to perform clothing management for toileting.  Standing at sink to wash hands pt with increased fatigue, required mod-max A to stand safely.  Required max cuing to sequence handwashing task.  Pt max A gait 10' in room after standing to wash hands.  Pt limited by increased fatigue and decreased problem solving this pm.  Individual therapy   Windel Keziah 07/18/2011, 3:17 PM

## 2011-07-18 NOTE — Progress Notes (Signed)
Speech Language Pathology Weekly Progress & Session Note  Patient Details  Name: Jean [name] MRN: [id number] Date of Birth: Apr 20, 1928  Today's Date: 07/18/2011 Time: 1400-1455 Time Calculation (min): 55 min  Short Term Goals:  SLP Short Term Goal 1 (Week 2): Pt will consume trials of thin liquids without overt s/s of aspiration with supervision. SLP Short Term Goal 1 - Progress (Week 2): Met SLP Short Term Goal 2 (Week 2): Pt will identify 1 physical and 1 cognitive deficit with Mod A verbal and semantic cues. SLP Short Term Goal 2 - Progress (Week 2): Not met SLP Short Term Goal 3 (Week 2): Pt will initiate functional tasks with Min A verbal and visual cues. SLP Short Term Goal 3 - Progress (Week 2): Not met SLP Short Term Goal 4 (Week 2): Pt will utilize visual aids  (schedule, calendar, etc.) to recall daily information with mod A semantic and verbal cues.  SLP Short Term Goal 4 - Progress (Week 2): Not met SLP Short Term Goal 5 (Week 2): Pt will sustain attention to a functional task for 2 mins with Max A verbal and tactile cues. SLP Short Term Goal 5 - Progress (Week 2): Met  New Short Term Goals: SLP Short Term Goal 1 (Week 3): Pt will sustain attention to a functonal task for 2 minutes with mod verbal cueing SLP Short Term Goal 2 (Week 3): Pt will generate a response to a question with less than a 10sec delay 50% of the time. SLP Short Term Goal 3 (Week 3): Pt will utilize external memory aids to increase recall and carryover of daily information/events with Mod A multimodal cueing SLP Short Term Goal 4 (Week 3): Pt will identify 1 physical and 1 cognitive deficit with Mod A semantic cues.  SLP Short Term Goal 5 (Week 3): Pt will intiate functional tasks with Min A multimodal cueing   Weekly Progress Updates:Patient has met 2 of 5 short term goals. Pt continues to require total-Max A for initiation, sustained attention, functional problem solving, working memory and intellectual  awareness. Pt had MBSS and recommended thin liquids via cup and Dys. 3 textures. Pt is tolerating well with intermittent supervision. Patient continues to demonstrate the following deficits: decreased initiation, decreased attention, decreased awareness, decreased problem solving, decreased safety awareness, decreased memory and delayed processing and therefore will continue to benefit from skilled SLP intervention to maximize cognitive function and overall independence. Pt's discharge plan changed to SNF secondary to the level of care she will need and her family is unable to provide this at this time. Patient's LTG's revised, continue plan of care.   Daily Session Skilled Therapeutic Intervention: Treatment focus on initiation, attention and genrating verbal expression. Pt required Max A verbal and visual cues for sustained attention to sorting task and for recall of instructions of task. Pt demonstrated an extreme delay in response to questions and required multiple repititions and ample processing time.   FIM:  Comprehension Comprehension Mode: Auditory Comprehension: 3-Understands basic 50 - 74% of the time/requires cueing 25 - 50%  of the time Expression Expression Mode: Verbal Expression: 2-Expresses basic 25 - 49% of the time/requires cueing 50 - 75% of the time. Uses single words/gestures. Social Interaction Social Interaction: 2-Interacts appropriately 25 - 49% of time - Needs frequent redirection. Problem Solving Problem Solving: 2-Solves basic 25 - 49% of the time - needs direction more than half the time to initiate, plan or complete simple activities Memory Memory: 2-Recognizes or recalls 25 -  49% of the time/requires cueing 51 - 75% of the time FIM - Eating Eating Activity: 5: Set-up assist for open containers Pain No/Denies Pain Cognition: Overall Cognitive Status: Impaired Arousal/Alertness: Awake/alert Orientation Level: Oriented to person;Oriented to place Attention:  Sustained Focused Attention: Appears intact Sustained Attention: Impaired Sustained Attention Impairment: Verbal basic;Functional basic Selective Attention: Impaired Selective Attention Impairment: Verbal basic;Functional basic Memory: Impaired Memory Impairment: Decreased short term memory;Decreased recall of new information Decreased Long Term Memory: Verbal basic;Functional basic Decreased Short Term Memory: Verbal basic;Functional basic Awareness: Impaired Awareness Impairment: Intellectual impairment Problem Solving: Impaired Problem Solving Impairment: Verbal basic;Functional basic Executive Function: Reasoning;Organizing;Initiating Reasoning: Impaired Reasoning Impairment: Verbal basic;Functional basic Sequencing Impairment: Verbal basic;Functional basic Organizing: Impaired Organizing Impairment: Verbal basic;Functional basic Initiating: Impaired Initiating Impairment: Verbal basic;Functional basic Safety/Judgment: Impaired Oral/Motor: Oral Motor/Sensory Function Overall Oral Motor/Sensory Function: Appears within functional limits for tasks assessed Motor Speech Overall Motor Speech: Appears within functional limits for tasks assessed Comprehension: Auditory Comprehension Overall Auditory Comprehension: Impaired Yes/No Questions: Within Functional Limits Complex Questions: 75-100% accurate Commands: Impaired Two Step Basic Commands: 50-74% accurate Multistep Basic Commands: 25-49% accurate Conversation: Simple Other Conversation Comments: Comprehension is impacted by pt's impaired sustained attention, decreased processing speed and impaired working memory.  Interfering Components: Attention;Working memory;Processing speed EffectiveTechniques: Dietitian: Within [employer] Reading Comprehension Reading Status: Not tested Functional Environmental (signs, name badge): Within functional  limits Interfering Components: Attention;Working Dance movement psychotherapist: Verbal cueing;Visual cueing Expression: Expression Primary Mode of Expression: Verbal Verbal Expression Overall Verbal Expression: Impaired Initiation: Impaired Automatic Speech: Name;Social Response Level of Generative/Spontaneous Verbalization: Sentence;Phrase Repetition: No impairment Naming: No impairment Pragmatics: Impairment Impairments: Topic maintenance Interfering Components: Attention Effective Techniques: Semantic cues Other Verbal Expression Comments: Pt's verbal expression impacted, working memory, and impaired thought organization.  Written Expression Dominant Hand: Right Written Expression: Not tested  Therapy/Group: Individual Therapy  Janaisa Birkland 07/18/2011, 4:33 PM

## 2011-07-19 NOTE — Progress Notes (Signed)
Occupational Therapy Session Note  Patient Details  Name: Jean [name] MRN: [id number] Date of Birth: 27-Apr-1928  Today's Date: 07/19/2011 Pain:  0/10 Time:  1515-1615  (60)  Time: Short Term Goals: Week 1:  OT Short Term Goal 1 (Week 1): Pt will sit EOB for 3 min wtih min A in prep for ADL tasks OT Short Term Goal 1 - Progress (Week 1): Met OT Short Term Goal 2 (Week 1): Pt will demonstrate sustained attention with min A during funcitonal grooming tasks OT Short Term Goal 2 - Progress (Week 1): Not met OT Short Term Goal 3 (Week 1): Pt will transfer with LRAD with max +1 for basic transfers OT Short Term Goal 3 - Progress (Week 1): Met OT Short Term Goal 4 (Week 1): Pt will don shirt with mod A in sitting  OT Short Term Goal 4 - Progress (Week 1): Met  Skilled Therapeutic Interventions/Progress Updates:    Pt. Engaged in standing balance, activity tolerance, and sustained attention.  She did not know the month or the day.  Pt. Maintained sustained attention for 2-3 minutes during tasks.  She stood for 15 sec to 3 minutes while engaging in connect four.  She was given instructions about how to play but was unable to follow without multiple cues and directions.   Provided written handout on some exercises to practice in her room as pt stating she wanted to get stronger so she could stand and walk.   Therapy Documentation Precautions:  Precautions Precautions: Fall;Back Precaution Comments: required total A for recall of 3/3 precautions Required Braces or Orthoses: Spinal Brace Spinal Brace: Lumbar corset Restrictions Weight Bearing Restrictions: No   Pain: Pain Assessment Pain Assessment: No/denies pain    See FIM for current functional status  Therapy/Group: Individual Therapy  Humberto Seals 07/19/2011, 3:30 PM

## 2011-07-19 NOTE — Progress Notes (Signed)
Speech Language Pathology Daily Session Note  Patient Details  Name: Jean [name] MRN: [id number] Date of Birth: 1928/03/15  Today's Date: 07/19/2011 Time: 2130-8657 Time Calculation (min): 33 min  Short Term Goals: Week 3: SLP Short Term Goal 1 (Week 3): Pt will sustain attention to a functonal task for 2 minutes with mod verbal cueing SLP Short Term Goal 2 (Week 3): Pt will generate a response to a question with less than a 10sec delay 50% of the time. SLP Short Term Goal 3 (Week 3): Pt will utilize external memory aids to increase recall and carryover of daily information/events with Mod A multimodal cueing SLP Short Term Goal 4 (Week 3): Pt will identify 1 physical and 1 cognitive deficit with Mod A semantic cues.  SLP Short Term Goal 5 (Week 3): Pt will intiate functional tasks with Min A multimodal cueing   Skilled Therapeutic Interventions: Pt with improved spontaneous conversation, initiation, and turn-taking.  Sustained attention continued to require verbal cues for success during orientation task with mod assist.  Responded to questions in timely manner (<10 sec) 90% of time at independent level.  Max verbal assist to seek info from environment to help with recall of visitors/events with verbal cues needed to stay on task and mod reminders of goal.  Able to identify physical deficits s/p rehab admission, but max cues to id cognitive changes.  Initiated conversation and functional tasks at bedside with supervision cues.  Appropriately tearful discussing family/grandchildren; improved pragmatics.  Daily Session Precautions/Restrictions  Restrictions Weight Bearing Restrictions: No FIM:  Comprehension Comprehension Mode: Auditory Comprehension: 5-Understands basic 90% of the time/requires cueing < 10% of the time Expression Expression Mode: Verbal Expression: 5-Expresses basic 90% of the time/requires cueing < 10% of the time. Social Interaction Social Interaction: 4-Interacts  appropriately 75 - 89% of the time - Needs redirection for appropriate language or to initiate interaction. Problem Solving Problem Solving: 2-Solves basic 25 - 49% of the time - needs direction more than half the time to initiate, plan or complete simple activities Memory Memory: 2-Recognizes or recalls 25 - 49% of the time/requires cueing 51 - 75% of the time   Pain Pain Assessment Pain Assessment: No/denies pain Pain Score: 0-No pain  Therapy/Group: Individual Therapy  Jean [name], [place] CCC/SLP Pager [phone]  Jean [name] Jean 07/19/2011, 12:40 PM

## 2011-07-19 NOTE — Progress Notes (Signed)
Patient ID: Jean [name], female   DOB: September 03, 1928, 78 y.o.   MRN: [id number] Patient ID: Jean [name], female   DOB: [birth year], 78 y.o.   MRN: [id number] Subjective/Complaints: Alert today and eating vigorously! Ros performed and otherwise negative  Objective: Vital Signs: Blood pressure 156/81, pulse 62, temperature 97.9 F (36.6 C), temperature source Oral, resp. rate 18, height 5\' 7"  (1.702 m), weight 65.545 kg (144 lb 8 oz), SpO2 97.00%. No results found. No results found for this basename: WBC:2,HGB:2,HCT:2,PLT:2 in the last 72 hours No results found for this basename: NA:2,K:2,CL:2,CO2:2,GLUCOSE:2,BUN:2,CREATININE:2,CALCIUM:2 in the last 72 hours CBG (last 3)  No results found for this basename: GLUCAP:3 in the last 72 hours  Wt Readings from Last 3 Encounters:  07/16/11 65.545 kg (144 lb 8 oz)  07/02/11 77.8 kg (171 lb 8.3 oz)  07/02/11 77.8 kg (171 lb 8.3 oz)    Physical Exam:  General appearance: alert, cooperative, appears stated age and no distress Head: Normocephalic, without obvious abnormality, atraumatic Eyes: conjunctivae/corneas clear. PERRL, EOM's intact.  Ears: normal external ear canals both ears Nose: Nares normal. Septum midline. Mucosa normal. No drainage or sinus tenderness. Throat: lips, mucosa, and tongue normal; teeth and gums normal Neck: no adenopathy, no carotid bruit, no JVD, supple, symmetrical, trachea midline and thyroid not enlarged, symmetric, no tenderness/mass/nodules Back: tender superior to incision. posture poor.  Resp: clear to auscultation bilaterally, no obvious wheezes or rales Cardio: regular rate and rhythm, S1, S2 normal, no murmur, click, rub or gallop GI: soft, non-tender; bowel sounds normal; no masses,  no organomegaly Extremities: extremities normal, atraumatic, no cyanosis or edema Pulses: 1+ edema lue Skin: Skin color, texture, turgor normal. No rashes or lesions Neurologic: fairly alert. Gross, generalized weakness--let  more than right (inconsistent) no focal CN findings. Limited insight and awareness. Oriented to place. Follows simple commands. 4/5 Strength BUE and BLE Incision/Wound: clean and intact and essentially closed   Assessment/Plan: 1. Functional deficits secondary to lumbar stenosis, radiculopathy, with post op watershed infarct which require 3+ hours per day of interdisciplinary therapy in a comprehensive inpatient rehab setting. Physiatrist is providing close team supervision and 24 hour management of active medical problems listed below. Physiatrist and rehab team continue to assess barriers to discharge/monitor patient progress toward functional and medical goals. FIM: FIM - Bathing Bathing Steps Patient Completed: Chest;Right Arm;Left Arm;Abdomen Bathing: 2: Max-Patient completes 3-4 70f 10 parts or 25-49%  FIM - Upper Body Dressing/Undressing Upper body dressing/undressing steps patient completed: Thread/unthread left sleeve of pullover shirt/dress;Put head through opening of pull over shirt/dress;Thread/unthread right sleeve of front closure shirt/dress;Pull shirt over trunk Upper body dressing/undressing: 5: Set-up assist to: Apply TLSO, cervical collar FIM - Lower Body Dressing/Undressing Lower body dressing/undressing steps patient completed: Thread/unthread right pants leg;Thread/unthread left pants leg;Don/Doff left shoe;Don/Doff right shoe Lower body dressing/undressing: 3: Mod-Patient completed 50-74% of tasks  FIM - Toileting Toileting: 1: Total-Patient completed zero steps, helper did all 3  FIM - Diplomatic Services operational officer Devices: Bedside commode Toilet Transfers: 3-From toilet/BSC: Mod A (lift or lower assist);3-To toilet/BSC: Mod A (lift or lower assist)  FIM - Press photographer Assistive Devices: Arm rests Bed/Chair Transfer: 3: Bed > Chair or W/C: Mod A (lift or lower assist);3: Chair or W/C > Bed: Mod A (lift or lower assist)  FIM  - Locomotion: Wheelchair Distance: 300 Locomotion: Wheelchair: 1: Travels less than 50 ft with minimal assistance (Pt.>75%) FIM - Locomotion: Ambulation Locomotion: Ambulation Assistive Devices: Designer, industrial/product  Ambulation/Gait Assistance: 3: Mod assist Locomotion: Ambulation: 1: Travels less than 50 ft with minimal assistance (Pt.>75%)  Comprehension Comprehension Mode: Auditory Comprehension: 3-Understands basic 50 - 74% of the time/requires cueing 25 - 50%  of the time  Expression Expression Mode: Verbal Expression: 2-Expresses basic 25 - 49% of the time/requires cueing 50 - 75% of the time. Uses single words/gestures.  Social Interaction Social Interaction: 2-Interacts appropriately 25 - 49% of time - Needs frequent redirection.  Problem Solving Problem Solving: 2-Solves basic 25 - 49% of the time - needs direction more than half the time to initiate, plan or complete simple activities  Memory Memory: 2-Recognizes or recalls 25 - 49% of the time/requires cueing 51 - 75% of the time  1. Lumbar stenosis with radiculopathy. Status post bilateral laminotomies with decompression and removal of previous hardware. Complicated by acute arterial bleeding with vascular repair 06/23/2011  2. 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 Continue aspirin Plavix. Carotid Dopplers are pending  3. DVT Prophylaxis/Anticoagulation: SCDs. Monitor for any signs of deep vein thrombosis  4. Hypertension. aldomet 250 mg twice a day, Lopressor 150 mg twice a day. bp boderline at times. 5. Hypothyroidism. Synthroid  6. Mood/postoperative confusion. Improved with limited insight and awareness 7. FEN: mild renal insufficiency  -push po fluids  -BUN and CR generally stable  -continue to hold lasix  -recheck bmet sunday  -added megace for appetite- ate much better yesterday and is eating well this am  -fluid upgrade this week per slp 8.  CHF exacerbation EF is 40-45%- lasix  added with good results but held   -monitor i's and o's as well as renal fxn  9. E COli UTI- keflex  -timed voiding to improve continence  -recheck ucx pending. LOS (Days) 17 A FACE TO FACE EVALUATION WAS PERFORMED  Lasaro Primm T 07/19/2011, 9:01 AM

## 2011-07-20 LAB — BASIC METABOLIC PANEL
BUN: 24 mg/dL — ABNORMAL HIGH (ref 6–23)
CO2: 21 mEq/L (ref 19–32)
Chloride: 105 mEq/L (ref 96–112)
Creatinine, Ser: 1.46 mg/dL — ABNORMAL HIGH (ref 0.50–1.10)
Glucose, Bld: 99 mg/dL (ref 70–99)

## 2011-07-20 NOTE — Progress Notes (Signed)
Patient ID: KIALA FARAJ, female   DOB: 15-Jul-1928, 78 y.o.   MRN: [id number] Patient ID: JOHNETTE TEIGEN, female   DOB: Oct 08, 1928, 78 y.o.   MRN: [id number] Patient ID: DORIA FERN, female   DOB: October 10, 1928, 78 y.o.   MRN: [id number] Subjective/Complaints: Alert today. No problems Ros performed and otherwise negative  Objective: Vital Signs: Blood pressure 148/67, pulse 66, temperature 97.9 F (36.6 C), temperature source Oral, resp. rate 18, height 5\' 7"  (1.702 m), weight 65.545 kg (144 lb 8 oz), SpO2 96.00%. No results found. No results found for this basename: WBC:2,HGB:2,HCT:2,PLT:2 in the last 72 hours  Basename 07/20/11 0618  NA 139  K 4.3  CL 105  CO2 21  GLUCOSE 99  BUN 24*  CREATININE 1.46*  CALCIUM 9.4   CBG (last 3)  No results found for this basename: GLUCAP:3 in the last 72 hours  Wt Readings from Last 3 Encounters:  07/16/11 65.545 kg (144 lb 8 oz)  07/02/11 77.8 kg (171 lb 8.3 oz)  07/02/11 77.8 kg (171 lb 8.3 oz)    Physical Exam:  General appearance: alert, cooperative, appears stated age and no distress Head: Normocephalic, without obvious abnormality, atraumatic Eyes: conjunctivae/corneas clear. PERRL, EOM's intact.  Ears: normal external ear canals both ears Nose: Nares normal. Septum midline. Mucosa normal. No drainage or sinus tenderness. Throat: lips, mucosa, and tongue normal; teeth and gums normal Neck: no adenopathy, no carotid bruit, no JVD, supple, symmetrical, trachea midline and thyroid not enlarged, symmetric, no tenderness/mass/nodules Back: tender superior to incision. posture poor.  Resp: clear to auscultation bilaterally, no obvious wheezes or rales Cardio: regular rate and rhythm, S1, S2 normal, no murmur, click, rub or gallop GI: soft, non-tender; bowel sounds normal; no masses,  no organomegaly Extremities: extremities normal, atraumatic, no cyanosis or edema Pulses: 1+ edema lue Skin: Skin color, texture, turgor normal. No rashes  or lesions Neurologic: fairly alert. Gross, generalized weakness--let more than right (inconsistent) no focal CN findings. Limited insight and awareness. Oriented to place. Follows simple commands. 4/5 Strength BUE and BLE Incision/Wound: clean and intact and essentially closed   Assessment/Plan: 1. Functional deficits secondary to lumbar stenosis, radiculopathy, with post op watershed infarct which require 3+ hours per day of interdisciplinary therapy in a comprehensive inpatient rehab setting. Physiatrist is providing close team supervision and 24 hour management of active medical problems listed below. Physiatrist and rehab team continue to assess barriers to discharge/monitor patient progress toward functional and medical goals. FIM: FIM - Bathing Bathing Steps Patient Completed: Chest;Right Arm;Left Arm;Abdomen Bathing: 5: Set-up assist to: Obtain items  FIM - Upper Body Dressing/Undressing Upper body dressing/undressing steps patient completed: Thread/unthread right sleeve of pullover shirt/dresss;Thread/unthread left sleeve of pullover shirt/dress;Put head through opening of pull over shirt/dress Upper body dressing/undressing: 5: Set-up assist to: Obtain clothing/put away FIM - Lower Body Dressing/Undressing Lower body dressing/undressing steps patient completed: Pull underwear up/down;Pull pants up/down;Don/Doff right shoe;Don/Doff left shoe Lower body dressing/undressing: 3: Mod-Patient completed 50-74% of tasks  FIM - Toileting Toileting: 1: Total-Patient completed zero steps, helper did all 3  FIM - Diplomatic Services operational officer Devices: Bedside commode Toilet Transfers: 3-From toilet/BSC: Mod A (lift or lower assist);3-To toilet/BSC: Mod A (lift or lower assist)  FIM - Press photographer Assistive Devices: Arm rests Bed/Chair Transfer: 3: Bed > Chair or W/C: Mod A (lift or lower assist);3: Chair or W/C > Bed: Mod A (lift or lower  assist)  FIM - Locomotion: Wheelchair Distance: 300  Locomotion: Wheelchair: 1: Travels less than 50 ft with minimal assistance (Pt.>75%) FIM - Locomotion: Ambulation Locomotion: Ambulation Assistive Devices: Designer, industrial/product Ambulation/Gait Assistance: 3: Mod assist Locomotion: Ambulation: 1: Travels less than 50 ft with minimal assistance (Pt.>75%)  Comprehension Comprehension Mode: Auditory Comprehension: 5-Understands complex 90% of the time/Cues < 10% of the time  Expression Expression Mode: Verbal Expression: 5-Expresses basic 90% of the time/requires cueing < 10% of the time.  Social Interaction Social Interaction: 4-Interacts appropriately 75 - 89% of the time - Needs redirection for appropriate language or to initiate interaction.  Problem Solving Problem Solving: 2-Solves basic 25 - 49% of the time - needs direction more than half the time to initiate, plan or complete simple activities  Memory Memory: 2-Recognizes or recalls 25 - 49% of the time/requires cueing 51 - 75% of the time  1. Lumbar stenosis with radiculopathy. Status post bilateral laminotomies with decompression and removal of previous hardware. Complicated by acute arterial bleeding with vascular repair 06/23/2011  2. 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 Continue aspirin Plavix. Carotid Dopplers are pending  3. DVT Prophylaxis/Anticoagulation: SCDs. Monitor for any signs of deep vein thrombosis  4. Hypertension. aldomet 250 mg twice a day, Lopressor 150 mg twice a day. bp boderline at times. 5. Hypothyroidism. Synthroid  6. Mood/postoperative confusion. Improved with limited insight and awareness 7. FEN: mild renal insufficiency  -push po fluids  -BUN and CR generally stable  -continue to hold lasix  -bmet stable to improved  -added megace for appetite- intake as a whole better.  -fluid upgrade this week per slp 8.  CHF exacerbation EF is 40-45%- lasix added with  good results but held   -monitor i's and o's as well as renal fxn  9. E COli UTI- keflex  -timed voiding to improve continence  -recheck ucx still pending. LOS (Days) 18 A FACE TO FACE EVALUATION WAS PERFORMED  Solangel Mcmanaway T 07/20/2011, 8:48 AM

## 2011-07-20 NOTE — Progress Notes (Signed)
Physical Therapy Note  Patient Details  Name: Jean [name] MRN: [id number] Date of Birth: 30-Aug-1928 Today's Date: 07/20/2011  1445-1525 (40 minutes)  Pain 5/10 low back/premedicated Focus of treatment: Gait training with RW; therapeutic activities to improve standing tolerance Treatment: sit to stand from wc with min/mod assist and max tactile cues to push up from armrest 15 feet X 1;  Standing using shoulder arc X 35 seconds before c/o fatigue ; second stand for < 15 seconds before c/o feeling "strange"; BP sitting 153/79 pulse 75 with decreased complaints.    Davinity Fanara,JIM 07/20/2011, 3:01 PM

## 2011-07-21 DIAGNOSIS — I6330 Cerebral infarction due to thrombosis of unspecified cerebral artery: Secondary | ICD-10-CM

## 2011-07-21 DIAGNOSIS — Z5189 Encounter for other specified aftercare: Secondary | ICD-10-CM

## 2011-07-21 DIAGNOSIS — M4716 Other spondylosis with myelopathy, lumbar region: Secondary | ICD-10-CM

## 2011-07-21 MED ORDER — BOOST / RESOURCE BREEZE PO LIQD
1.0000 | ORAL | Status: DC
  Administered 2011-07-22 – 2011-07-23 (×2): 1 via ORAL

## 2011-07-21 NOTE — Progress Notes (Addendum)
Nutrition Follow-up  Pt now on thin liquids s/p MBSS on 5/14. Intake improved, eating 50 - 100% of meals. D/c plans changed to SNF.  Diet Order:  Dysphagia 3 with thins Supplements: Ensure Pudding TID and Breeze BID  Meds: Scheduled Meds:   . aspirin  81 mg Oral Daily  . clopidogrel  75 mg Oral Q breakfast  . feeding supplement  1 Container Oral TID BM  . feeding supplement  1 Container Oral BID BM  . levothyroxine  75 mcg Oral QAC breakfast  . megestrol  400 mg Oral BID  . methyldopa  250 mg Oral BID  . metoprolol tartrate  100 mg Oral BID  . pantoprazole  40 mg Oral Q1200   Continuous Infusions:  PRN Meds:.acetaminophen, food thickener, menthol-cetylpyridinium, methocarbamol, nitroGLYCERIN, ondansetron (ZOFRAN) IV, ondansetron, phenol, polyethylene glycol, sorbitol  Labs:  CMP     Component Value Date/Time   NA 139 07/20/2011 0618   K 4.3 07/20/2011 0618   CL 105 07/20/2011 0618   CO2 21 07/20/2011 0618   GLUCOSE 99 07/20/2011 0618   GLUCOSE 100* 01/30/2006 1630   BUN 24* 07/20/2011 0618   CREATININE 1.46* 07/20/2011 0618   CALCIUM 9.4 07/20/2011 0618   CALCIUM 9.5 08/18/2006 0105   PROT 5.4* 07/03/2011 0622   ALBUMIN 2.0* 07/03/2011 0622   AST 34 07/03/2011 0622   ALT 25 07/03/2011 0622   ALKPHOS 103 07/03/2011 0622   BILITOT 0.6 07/03/2011 0622   GFRNONAA 33* 07/20/2011 0618   GFRAA 38* 07/20/2011 0618    Intake/Output Summary (Last 24 hours) at 07/21/11 1222 Last data filed at 07/21/11 0800  Gross per 24 hour  Intake    360 ml  Output      0 ml  Net    360 ml  Last BM per flowsheets: 5/19  Weight Status:  65.5 kg (144 lb) - stable; usual weight 145 - 150 lb PTA  Estimated needs:  1600 - 1800 kcal, 80 - 90 grams protein  Nutrition Dx:  Inadequate oral intake r/t variable appetite AEB fluctuation PO intake. Resolved.  Goal:  Pt to consume >/= 75% of meals and supplements. Improved.  Intervention:  Continue current interventions, intake is improving daily; decrease Breeze to  daily.  Monitor:  Weights, labs, PO intake, I/O's  Adair Laundry Pager #:  [phone]

## 2011-07-21 NOTE — Progress Notes (Signed)
Social Work Patient ID: Jean [name], female   DOB: [birth year], 78 y.o.   MRN: [id number]   Have confirmed with patient and husband that d/c plan is changed to SNF. Sending FL2 out today to area facilities.  Wilma Wuthrich

## 2011-07-21 NOTE — Progress Notes (Signed)
Recreational Therapy Session Note  Patient Details  Name: Jean [name] MRN: [id number] Date of Birth: March 02, 1929 Today's Date: 07/21/2011 Time: 1130-12 Pain: no c/o Skilled Therapeutic Interventions/Progress Updates: Stood to complete tabletop craft with moderate cues and min-mod assist for lifting.  Pt contact guard assist/min A once standing for balance. Pt required max instructional cues for safety/reaching back before sitting and pushing up from armrests to stand.  Therapy/Group: Co-Treatment  Activity Level: Simple:  Level of assist: min-mod standing/supervision seated  Shemicka Cohrs 07/21/2011, 12:00 PM

## 2011-07-21 NOTE — Progress Notes (Signed)
Subjective/Complaints: Alert today. More alert per staff. Ros performed and otherwise negative  Objective: Vital Signs: Blood pressure 154/78, pulse 64, temperature 97.9 F (36.6 C), temperature source Oral, resp. rate 18, height 5\' 7"  (1.702 m), weight 65.545 kg (144 lb 8 oz), SpO2 98.00%. No results found. No results found for this basename: WBC:2,HGB:2,HCT:2,PLT:2 in the last 72 hours  Basename 07/20/11 0618  NA 139  K 4.3  CL 105  CO2 21  GLUCOSE 99  BUN 24*  CREATININE 1.46*  CALCIUM 9.4   CBG (last 3)  No results found for this basename: GLUCAP:3 in the last 72 hours  Wt Readings from Last 3 Encounters:  07/16/11 65.545 kg (144 lb 8 oz)  07/02/11 77.8 kg (171 lb 8.3 oz)  07/02/11 77.8 kg (171 lb 8.3 oz)    Physical Exam:  General appearance: alert, cooperative, appears stated age and no distress Head: Normocephalic, without obvious abnormality, atraumatic Eyes: conjunctivae/corneas clear. PERRL, EOM's intact.  Ears: normal external ear canals both ears Nose: Nares normal. Septum midline. Mucosa normal. No drainage or sinus tenderness. Throat: lips, mucosa, and tongue normal; teeth and gums normal Neck: no adenopathy, no carotid bruit, no JVD, supple, symmetrical, trachea midline and thyroid not enlarged, symmetric, no tenderness/mass/nodules Back: tender superior to incision. posture poor.  Resp: clear to auscultation bilaterally, no obvious wheezes or rales Cardio: regular rate and rhythm, S1, S2 normal, no murmur, click, rub or gallop GI: soft, non-tender; bowel sounds normal; no masses,  no organomegaly Extremities: extremities normal, atraumatic, no cyanosis or edema Pulses: 1+ edema lue Skin: Skin color, texture, turgor normal. No rashes or lesions Neurologic: fairly alert. Gross, generalized weakness--let more than right (inconsistent) no focal CN findings. Limited insight and awareness. Oriented to place. Follows simple commands. 4/5 Strength BUE and  BLE Incision/Wound: clean and intact and essentially closed   Assessment/Plan: 1. Functional deficits secondary to lumbar stenosis, radiculopathy, with post op watershed infarct which require 3+ hours per day of interdisciplinary therapy in a comprehensive inpatient rehab setting. Physiatrist is providing close team supervision and 24 hour management of active medical problems listed below. Physiatrist and rehab team continue to assess barriers to discharge/monitor patient progress toward functional and medical goals. FIM: FIM - Bathing Bathing Steps Patient Completed: Chest;Right Arm;Left Arm;Abdomen Bathing: 5: Set-up assist to: Obtain items  FIM - Upper Body Dressing/Undressing Upper body dressing/undressing steps patient completed: Thread/unthread right sleeve of pullover shirt/dresss;Thread/unthread left sleeve of pullover shirt/dress;Put head through opening of pull over shirt/dress Upper body dressing/undressing: 5: Set-up assist to: Obtain clothing/put away FIM - Lower Body Dressing/Undressing Lower body dressing/undressing steps patient completed: Pull underwear up/down;Pull pants up/down;Don/Doff right shoe;Don/Doff left shoe Lower body dressing/undressing: 3: Mod-Patient completed 50-74% of tasks  FIM - Toileting Toileting: 1: Total-Patient completed zero steps, helper did all 3  FIM - Diplomatic Services operational officer Devices: Bedside commode Toilet Transfers: 3-From toilet/BSC: Mod A (lift or lower assist);3-To toilet/BSC: Mod A (lift or lower assist)  FIM - Press photographer Assistive Devices: Arm rests Bed/Chair Transfer: 3: Bed > Chair or W/C: Mod A (lift or lower assist);3: Chair or W/C > Bed: Mod A (lift or lower assist)  FIM - Locomotion: Wheelchair Distance: 300 Locomotion: Wheelchair: 1: Travels less than 50 ft with minimal assistance (Pt.>75%) FIM - Locomotion: Ambulation Locomotion: Ambulation Assistive Devices: Dealer Ambulation/Gait Assistance: 4: Min guard Locomotion: Ambulation: 1: Travels less than 50 ft with minimal assistance (Pt.>75%)  Comprehension Comprehension Mode: Auditory Comprehension:  5-Understands basic 90% of the time/requires cueing < 10% of the time  Expression Expression Mode: Verbal Expression: 5-Expresses basic needs/ideas: With extra time/assistive device  Social Interaction Social Interaction: 4-Interacts appropriately 75 - 89% of the time - Needs redirection for appropriate language or to initiate interaction.  Problem Solving Problem Solving: 3-Solves basic 50 - 74% of the time/requires cueing 25 - 49% of the time  Memory Memory: 3-Recognizes or recalls 50 - 74% of the time/requires cueing 25 - 49% of the time  1. Lumbar stenosis with radiculopathy. Status post bilateral laminotomies with decompression and removal of previous hardware. Complicated by acute arterial bleeding with vascular repair 06/23/2011  2. 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 Continue aspirin Plavix. Carotid Dopplers are pending  3. DVT Prophylaxis/Anticoagulation: SCDs. Monitor for any signs of deep vein thrombosis  4. Hypertension. aldomet 250 mg twice a day, Lopressor 150 mg twice a day. bp boderline at times. 5. Hypothyroidism. Synthroid  6. Mood/postoperative confusion. Improved with limited insight and awareness 7. FEN: mild renal insufficiency  -push po fluids  -BUN and CR generally stable  -continue to hold lasix  -bmet stable to improved  -added megace for appetite- intake as a whole is much better.  -fluid upgrade this week per slp 8.  CHF exacerbation EF is 40-45%- lasix added with good results but held   -monitor i's and o's as well as renal fxn  9. E COli UTI- keflex completed.  -timed voiding to improve continence  -recheck ucx now with enterococcus- await sensitivities LOS (Days) 19 A FACE TO FACE EVALUATION WAS  PERFORMED  Jean [name] T 07/21/2011, 8:09 AM

## 2011-07-21 NOTE — Progress Notes (Signed)
Occupational Therapy Session Note  Patient Details  Name: Jean [name] MRN: [id number] Date of Birth: October 24, 1928  Today's Date: 07/21/2011 Time: 1300-1330 Time Calculation (min): 30 min  Short Term Goals: Week 3:  OT Short Term Goal 1 (Week 3): Pt will answer a functional question with only 10 sec delay OT Short Term Goal 2 (Week 3): Pt will demonstrate sustained attention for 3 min with mod A OT Short Term Goal 3 (Week 3): Pt will don pants with min A sit to stand OT Short Term Goal 4 (Week 3): Pt will perform 2/3 toileting tasks with min A  Skilled Therapeutic Interventions/Progress Updates:    1:1 focus on toilet transfers stand pivot with RW, toileting with RW sit to stand with min A, max A for toileting (needed A with clothing mangament. Ambulated with RW back out to room to w/c. Washed her hands in standing with min  A to maintain standing balance with upright posture. Balance activity: sitting EOM in gym focusing on core and back muscles to maintain an upright posture and hold her head up statically and while volleying a ball back and forth.  Therapy Documentation Precautions:  Precautions Precautions: Fall;Back Precaution Comments: required total A for recall of 3/3 precautions Required Braces or Orthoses: Spinal Brace Spinal Brace: Lumbar corset Restrictions Weight Bearing Restrictions: No Pain:  no c/o pain  See FIM for current functional status  Therapy/Group: Individual Therapy  Roney Mans [facility] 07/21/2011, 2:58 PM

## 2011-07-21 NOTE — Progress Notes (Signed)
Occupational Therapy Session Note  Patient Details  Name: Jean [name] MRN: [id number] Date of Birth: May 19, 1928  Today's Date: 07/21/2011 Time: 0730-0830 Time Calculation (min): 60 min  Short Term Goals: Week 3:  OT Short Term Goal 1 (Week 3): Pt will answer a functional question with only 10 sec delay OT Short Term Goal 2 (Week 3): Pt will demonstrate sustained attention for 3 min with mod A OT Short Term Goal 3 (Week 3): Pt will don pants with min A sit to stand OT Short Term Goal 4 (Week 3): Pt will perform 2/3 toileting tasks with min A  Skilled Therapeutic Interventions/Progress Updates:    1:1 self care retraining: Pt in w/c when arrived pt in chair sliding out of chair with decreased awareness. Dressed sitting in w/c with pt demonstrating increased attention to task requiring less verbal cues for focusing on task. Did need cuing throughout session to maintain back precautions. Pt able to demonstrate sit to stand with RW in front of her with steadying A. Able to come into full erect stance for clothing management and able to maintain standing balance with steadying A to brush teeth at the sink. Pt with increased initiation during meal time to prep coffee with sugar and creamer and fix pancake (including cutting it).  Therapy Documentation Precautions:  Precautions Precautions: Fall;Back Precaution Comments: required total A for recall of 3/3 precautions Required Braces or Orthoses: Spinal Brace Spinal Brace: Lumbar corset Restrictions Weight Bearing Restrictions: No Pain:  no c/o pain  See FIM for current functional status  Therapy/Group: Individual Therapy  Roney Mans [facility] 07/21/2011, 12:17 PM

## 2011-07-21 NOTE — Progress Notes (Signed)
Speech Language Pathology Daily Session Note  Patient Details  Name: ADHYA COCCO MRN: [id number] Date of Birth: [birth year]  Today's Date: 07/21/2011 Time: 1420-1500 Time Calculation (min): 40 min  Short Term Goals: Week 3: SLP Short Term Goal 1 (Week 3): Pt will sustain attention to a functonal task for 2 minutes with mod verbal cueing SLP Short Term Goal 2 (Week 3): Pt will generate a response to a question with less than a 10sec delay 50% of the time. SLP Short Term Goal 3 (Week 3): Pt will utilize external memory aids to increase recall and carryover of daily information/events with Mod A multimodal cueing SLP Short Term Goal 4 (Week 3): Pt will identify 1 physical and 1 cognitive deficit with Mod A semantic cues.  SLP Short Term Goal 5 (Week 3): Pt will intiate functional tasks with Min A multimodal cueing   Skilled Therapeutic Interventions: Session focused on functional problem solving; SLP facilitated session with minimal assist sematic and tactile cues to sort money and moderate assist semantic, written/visual cues to add basic money.  Patient demonstrated awareness of deficits in the moment by stating "This should not be so hard for me." Patient with appropriate conversational exchange throughout session with appropriate initiation and humor.    Daily Session Precautions/Restrictions    FIM:  Comprehension Comprehension Mode: Auditory Comprehension: 5-Understands basic 90% of the time/requires cueing < 10% of the time Expression Expression Mode: Verbal Expression: 5-Expresses basic needs/ideas: With extra time/assistive device Social Interaction Social Interaction: 5-Interacts appropriately 90% of the time - Needs monitoring or encouragement for participation or interaction. Problem Solving Problem Solving: 3-Solves basic 50 - 74% of the time/requires cueing 25 - 49% of the time Memory Memory: 3-Recognizes or recalls 50 - 74% of the time/requires cueing 25 - 49% of the  time General    Pain Pain Assessment Pain Assessment: No/denies pain Pain Score: 0-No pain  Therapy/Group: Individual Therapy  Charlane Ferretti., CCC-SLP [phone]  Izabella Marcantel 07/21/2011, 4:27 PM

## 2011-07-21 NOTE — Care Management Note (Signed)
Per State Regulation 482.30 This chart was reviewed for medical necessity with respect to the patient's Admission/Duration of stay. Pt is participating & improving.  Family wishes daily txs to continue in SNF at d/c from CIR & then home.  FL2 has been sent out.  Await bed offers.   Brock Ra                 Nurse Care Manager              Next Review Date: 07/24/11

## 2011-07-21 NOTE — Progress Notes (Signed)
Physical Therapy Note  Patient Details  Name: Jean [name] MRN: [id number] Date of Birth: Jul 09, 1928 Today's Date: 07/21/2011  Time: 1100-1200 60 minutes  No c/o pain.  Bed mobility performed with supervision, increased time and cues for technique.  Gait with RW with min A 3 x 30' with cues for safety and obstacle negotiation.  Stair training for strength, endurance and initiation with B handrails min A, 5 stairs x 2.  Pt required cues to advance L hand along railing of stairs.  Standing balance with art activity for co-tx with rec therapist.  Pt performed multiple sit to stands with mod A, max cues for safety, hand placement with sit <> stand.  Standing up to 90 seconds with B UE task before fatigued.  Pt required mod-max cues to follow directions and initiate participation in art activity.  Overall pt with improved activity tolerance, still limited by cognitive deficits.  Individual therapy   Myking Sar 07/21/2011, 12:23 PM

## 2011-07-22 LAB — URINE CULTURE
Colony Count: >=100000
Culture  Setup Time: 201305170925

## 2011-07-22 MED ORDER — CIPROFLOXACIN HCL 250 MG PO TABS
250.0000 mg | ORAL_TABLET | Freq: Two times a day (BID) | ORAL | Status: DC
  Administered 2011-07-22 – 2011-07-23 (×3): 250 mg via ORAL
  Filled 2011-07-22 (×6): qty 1

## 2011-07-22 NOTE — Discharge Summary (Signed)
  Discharge summary job [id number]

## 2011-07-22 NOTE — Progress Notes (Signed)
Physical Therapy Note  Patient Details  Name: Jean [name] MRN: [id number] Date of Birth: 06/14/1928 Today's Date: 07/22/2011  Time: [phone] 57 minutes  No c/o pain.  Gait in controlled and household environments with focus on upright posture and increased step length on L with min A with RW.  Stair training for endurance and strength 5 stairs with B handrails, min A.  Standing kitchen activity with making pudding. Pt able to stand up to 2 minutes before fatigued.  Pt limited by decreased strength and endurance of back muscles, leans on counter, RW requiring mod manual assist to stay upright.  Pt max-total A for problem solving and sequencing pudding making task.  Required questioning cues and occasional hand over hand assist to perform task.  W/c mobility with supervision > 50' in controlled environment.  Pt cognitive status declines significantly with fatigue.  Individual therapy   Marvella Jenning 07/22/2011, 12:16 PM

## 2011-07-22 NOTE — Progress Notes (Signed)
Occupational Therapy Session Note  Patient Details  Name: Jean [name] MRN: [id number] Date of Birth: 05/22/1928  Today's Date: 07/22/2011 Time: 0730-0830 Time Calculation (min): 60 min  Short Term Goals: Week 3:  OT Short Term Goal 1 (Week 3): Pt will answer a functional question with only 10 sec delay OT Short Term Goal 2 (Week 3): Pt will demonstrate sustained attention for 3 min with mod A OT Short Term Goal 3 (Week 3): Pt will don pants with min A sit to stand OT Short Term Goal 4 (Week 3): Pt will perform 2/3 toileting tasks with min A  Skilled Therapeutic Interventions/Progress Updates:    1:1 self care retraining: pt more sleepy this morning resulting in increased difficulty in answering questions, simple problem solving with familiar daily tasks. As session went on this improved. Functional ambulation from EOB to bathroom to transfer to toilet. Needed max A (for clothing management) with toileting; able to stand with min to mod A to wash pericare after incontient of urine in brief. Dressed with AE for increase independence and maintain back precautions. Focused on sitting balance with upright posture not leaning up against chair during grooming and brushing teeth in standing with min A  Therapy Documentation Precautions:  Precautions Precautions: Fall;Back Precaution Comments: required total A for recall of 3/3 precautions Required Braces or Orthoses: Spinal Brace Spinal Brace: Lumbar corset Restrictions Weight Bearing Restrictions: No    Pain: Pain Assessment Pain Assessment: No/denies pain Pain Score: 0-No pain  See FIM for current functional status  Therapy/Group: Individual Therapy  Roney Mans [facility] 07/22/2011, 12:14 PM

## 2011-07-22 NOTE — Progress Notes (Signed)
Patient ID: Jean [name], female   DOB: 10/08/1928, 78 y.o.   MRN: [id number] Subjective/Complaints: Good night, resting this am.  Ros performed and otherwise negative  Objective: Vital Signs: Blood pressure 177/79, pulse 61, temperature 97.9 F (36.6 C), temperature source Oral, resp. rate 16, height 5\' 7"  (1.702 m), weight 69.1 kg (152 lb 5.4 oz), SpO2 97.00%. No results found. No results found for this basename: WBC:2,HGB:2,HCT:2,PLT:2 in the last 72 hours  Basename 07/20/11 0618  NA 139  K 4.3  CL 105  CO2 21  GLUCOSE 99  BUN 24*  CREATININE 1.46*  CALCIUM 9.4   CBG (last 3)  No results found for this basename: GLUCAP:3 in the last 72 hours  Wt Readings from Last 3 Encounters:  07/22/11 69.1 kg (152 lb 5.4 oz)  07/02/11 77.8 kg (171 lb 8.3 oz)  07/02/11 77.8 kg (171 lb 8.3 oz)    Physical Exam:  General appearance: alert, cooperative, appears stated age and no distress Head: Normocephalic, without obvious abnormality, atraumatic Eyes: conjunctivae/corneas clear. PERRL, EOM's intact.  Ears: normal external ear canals both ears Nose: Nares normal. Septum midline. Mucosa normal. No drainage or sinus tenderness. Throat: lips, mucosa, and tongue normal; teeth and gums normal Neck: no adenopathy, no carotid bruit, no JVD, supple, symmetrical, trachea midline and thyroid not enlarged, symmetric, no tenderness/mass/nodules Back: tender superior to incision. posture poor.  Resp: clear to auscultation bilaterally, no obvious wheezes or rales Cardio: regular rate and rhythm, S1, S2 normal, no murmur, click, rub or gallop GI: soft, non-tender; bowel sounds normal; no masses,  no organomegaly Extremities: extremities normal, atraumatic, no cyanosis or edema Pulses: 1+ edema lue Skin: Skin color, texture, turgor normal. No rashes or lesions Neurologic: fairly alert. Gross, generalized weakness--let more than right (inconsistent) no focal CN findings. Limited insight and awareness.  Oriented to place. Follows simple commands. 4/5 Strength BUE and BLE Incision/Wound: clean and intact and essentially closed   Assessment/Plan: 1. Functional deficits secondary to lumbar stenosis, radiculopathy, with post op watershed infarct which require 3+ hours per day of interdisciplinary therapy in a comprehensive inpatient rehab setting. Physiatrist is providing close team supervision and 24 hour management of active medical problems listed below. Physiatrist and rehab team continue to assess barriers to discharge/monitor patient progress toward functional and medical goals. FIM: FIM - Bathing Bathing Steps Patient Completed: Chest;Right Arm;Left Arm;Abdomen Bathing: 1: Two helpers  FIM - Upper Body Dressing/Undressing Upper body dressing/undressing steps patient completed: Thread/unthread right bra strap;Thread/unthread left bra strap Upper body dressing/undressing: 5: Set-up assist to: Obtain clothing/put away FIM - Lower Body Dressing/Undressing Lower body dressing/undressing steps patient completed: Pull pants up/down Lower body dressing/undressing: 3: Mod-Patient completed 50-74% of tasks  FIM - Toileting Toileting: 1: Total-Patient completed zero steps, helper did all 3  FIM - Diplomatic Services operational officer Devices: Bedside commode Toilet Transfers: 3-From toilet/BSC: Mod A (lift or lower assist);3-To toilet/BSC: Mod A (lift or lower assist)  FIM - Press photographer Assistive Devices: Arm rests Bed/Chair Transfer: 4: Bed > Chair or W/C: Min A (steadying Pt. > 75%);4: Chair or W/C > Bed: Min A (steadying Pt. > 75%)  FIM - Locomotion: Wheelchair Distance: 300 Locomotion: Wheelchair: 2: Travels 50 - 149 ft with supervision, cueing or coaxing FIM - Locomotion: Ambulation Locomotion: Ambulation Assistive Devices: Designer, industrial/product Ambulation/Gait Assistance: 4: Min guard Locomotion: Ambulation: 1: Travels less than 50 ft with minimal  assistance (Pt.>75%)  Comprehension Comprehension Mode: Auditory Comprehension: 5-Understands complex 90%  of the time/Cues < 10% of the time  Expression Expression Mode: Verbal Expression: 5-Expresses basic needs/ideas: With extra time/assistive device  Social Interaction Social Interaction: 5-Interacts appropriately 90% of the time - Needs monitoring or encouragement for participation or interaction.  Problem Solving Problem Solving: 3-Solves basic 50 - 74% of the time/requires cueing 25 - 49% of the time  Memory Memory: 3-Recognizes or recalls 50 - 74% of the time/requires cueing 25 - 49% of the time  1. Lumbar stenosis with radiculopathy. Status post bilateral laminotomies with decompression and removal of previous hardware. Complicated by acute arterial bleeding with vascular repair 06/23/2011  2. 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 Continue aspirin Plavix. Carotid Dopplers are pending  3. DVT Prophylaxis/Anticoagulation: SCDs. Monitor for any signs of deep vein thrombosis  4. Hypertension. aldomet 250 mg twice a day, Lopressor 150 mg twice a day. Consider further adjusting regimen. 5. Hypothyroidism. Synthroid  6. Mood/postoperative confusion. Improved with limited insight and awareness 7. FEN: mild renal insufficiency  -push po fluids  -BUN and CR generally stable  -continue to hold lasix  -bmet stable to improved  -added megace for appetite- intake as a whole is much better.  -fluid upgrade this week per slp 8.  CHF exacerbation EF is 40-45%- lasix added with good results but held   -monitor i's and o's as well as renal fxn  9. E COli UTI- keflex completed.  -now has enterococcus UTI- begin cipro x 7days LOS (Days) 20 A FACE TO FACE EVALUATION WAS PERFORMED  Jodye Scali T 07/22/2011, 8:13 AM

## 2011-07-22 NOTE — Discharge Summary (Signed)
Jean [name], Jean [name] NO.:  [id number]  MEDICAL RECORD NO.:  [id number]  LOCATION:  4155                         FACILITY:  [facility]  PHYSICIAN:  Ranelle Oyster, M.D.DATE OF BIRTH:  07-12-1928  DATE OF ADMISSION:  07/02/2011 DATE OF DISCHARGE:  07/23/2011                              DISCHARGE SUMMARY   DISCHARGE DIAGNOSES: 1. Lumbar stenosis with radiculopathy, status post bilateral     laminotomies with decompression and removal of previous hardware. 2. Multiple acute to subacute nonhemorrhagic infarcts watershed     distribution. 3. Sequential compression devices for DVT prophylaxis. 4. Hypertension. 5. Hypothyroidism. 6. Mood. 7. Enterococcus urinary tract infection.  HISTORY OF PRESENT ILLNESS:  An 78 year old right-handed female with history of back surgery in 2005, presented June 23, 2011, with low back pain radiating to the left lower extremity.  X-rays and imaging revealed a lumbar L2-3 stenosis, kyphoscoliosis and radiculopathy.  Underwent bilateral laminotomies and foraminotomies, decompression lumbar L2-3 with decompression of lumbar L2 and L3 nerve roots as well as removal of previously placed hardware lumbar L3-5, pedicle screw fixation per Dr. Danielle Dess June 23, 2011.  The patient developed acute arterial bleeding during the case with Vascular Surgery consulted, Dr. Imogene Burn and underwent repair of avulsed lumbar artery.  During that procedure, she lost an estimated 1800 mL of blood, did receive 2 units of packed red blood cells.  On June 25, 2011, noted right-sided neglect.  MRI of the brain showed multiple acute subacute nonhemorrhagic small infarcts throughout the supratentorial edge.  Question watershed type infarct with embolic infarcts not excluded.  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   Neurology was consulted and advised to continue aspirin and Plavix therapy as prior to hospital admission.  The patient with chronic renal insufficiency, creatinine 1.5-1.8 and monitored.  She was admitted for comprehensive rehab program.  PAST MEDICAL HISTORY:  See discharge diagnoses.  ALLERGIES:  ACE inhibitors, ezetimibe, simvastatin, rosuvastatin, and penicillin.  SOCIAL HISTORY:  Lives with spouse 1 level home, 2 steps to entry.  FUNCTIONAL HISTORY PRIOR TO ADMISSION:  She did not drive.  She is retired.  She did very little house keeping.  She was able to perform basic activities of daily living.  FUNCTIONAL STATUS UPON ADMISSION TO REHAB SERVICES:  Moderate assist to roll to the left, minimal assist to scoot to the edge of the bed, +2 total assist for stand to sit.  PHYSICAL EXAMINATION:  VITAL SIGNS:  Blood pressure 156/64, pulse 67, temperature 98.1, respirations 16. GENERAL:  This was an alert female, needed subtle cues for date, and birth and place.  NEURO:  Affect was flat and slow to respond to some questions.  She was able to name a picture of her grandson.  She could not name familiar objects.  She does have some decreased awareness of her deficits.  She moves right side more than the left, but is inconsistent. LUNGS:  Clear to auscultation. CARDIAC:  Regular rate and rhythm. ABDOMEN:  Soft, nontender.  Good bowel sounds. BACK:  Incision was clean and dry.  REHABILITATION HOSPITAL COURSE:  The patient was admitted to Inpatient Rehab Services with therapies initiated on a 3 hour daily basis consisting of physical therapy, occupational therapy, and rehabilitation nursing.  The following issues were addressed during the patient's rehabilitation stay.  Pertaining to Ms. Siegert's lumbar stenosis, radiculopathy, she had undergone bilateral laminotomies, decompression, removal of previous hardware on June 23, 2011, course was complicated by acute arterial bleeding  with vascular repair by Dr. Imogene Burn.  Her back incision was healing nicely.  Hospital course complicated by multiple acute subacute nonhemorrhagic infarctions maintained on aspirin and Plavix therapy.  Speech Therapy did follow up for any swallowing dysfunction, maintained on a mechanical soft diet.  Sequential compression devices were in place for DVT prophylaxis.  Her blood pressures were controlled on Aldomet as well as Lopressor with no orthostatic changes.  She remained on Cipro for an Enterococcus urinary tract infection for a total of 7 days with no dysuria or hematuria. Hormone supplement for hypothyroidism as advised.  She initially had some postoperative confusion.  She was weaned off low-dose Risperdal. The patient received weekly collaborative interdisciplinary team conferences to discuss estimated length of stay, family teaching, and any barriers to her discharge.  She had occasional incontinence of bowel, a Foley catheter tube had been in place.  Tolerating a mechanical soft diet with thin liquids.  She was moderate assist for upper body, total assist lower body, sitting balance with minimal assistance sit to stand, pulling up on something was moderate assist, toileting of total assist, transfers to scoot pivot moderate assist, mobility moderate assist transfers and ambulating with a rolling walker up to 40 feet, poor problem solving and does not recall her back precautions.  Due to her limited gains and family assistance, it was felt skilled nursing facility was needed with bed becoming available on Jul 23, 2011, and discharge taking place at that time to skilled facility.  LATEST LABORATORY DATA:  A sodium 139, potassium 4.3, BUN 24, creatinine 1.46.  Latest hemoglobin of 11.4, hematocrit 34.5.  DISCHARGE MEDICATIONS: 1. Tylenol 650 mg p.o. every 4 hours as needed. 2. Aspirin 81 mg p.o. daily. 3. Cipro 250 mg p.o. b.i.d. until Jul 29, 2011, and stop. 4. Plavix 75 mg daily  in short supplement 1 can 3 times daily. 5. Synthroid 75 mcg p.o. daily. 6. Robaxin 250 mg p.o. every 8 hours as needed for spasms. 7. Aldomet 250 mg p.o. b.i.d. 8. Lopressor 150 mg p.o. b.i.d. 9. Protonix 40 mg p.o. daily.  DIET:  Dysphagia 3 thin liquid diet.  Meds crushed in puree.  Full supervision with meals.  SPECIAL INSTRUCTIONS:  The patient should follow up Dr. Barnett Abu, Neurosurgery [phone] call for appointment, Dr. Harold Hedge at the Outpatient Rehab Service office as directed, Dr. Posey Rea, medical management.     Mariam Dollar, P.A.   ______________________________ Ranelle Oyster, M.D.    DA/MEDQ  D:  07/22/2011  T:  07/22/2011  Job:  [phone]  cc:   Georgina Quint. Plotnikov, MD Stefani Dama, M.D.

## 2011-07-22 NOTE — Progress Notes (Signed)
Recreational Therapy Session Note  Patient Details  Name: Jean [name] MRN: [id number] Date of Birth: 03/06/1928 Today's Date: 07/22/2011 Time: 1315-1330 Pain: no c/o Skilled Therapeutic Interventions/Progress Updates: pt sat EOM for ball toss activity using a lettered ball naming items in a category that began with a letter on the ball.  Pt required supervision-contact guard assist for balance requiring total assist to remember what to do during task and mod cues for balance/posture.  Therapy/Group: Co-Treatment  Activity Level: Moderate:  Level of assist: Min Assist  Traeh Milroy 07/22/2011, 4:15 PM

## 2011-07-22 NOTE — Progress Notes (Signed)
Occupational Therapy Discharge Summary  Patient Details  Name: Jean [name] MRN: [id number] Date of Birth: Aug 23, 1928  Today's Date: 07/22/2011 Time: 1300-1330 Time Calculation (min): 30 min  1:1 therapeutic activity focus on trunk/core mm strengthening: sitting EOB tossing a lettered ball back and forth. Also addressing cognition (attention, initiation) naming a food object that starts with a letter her right hand was on. Pt needed total A to remember what to do with the ball when she caught it and needed mod cuing to maintain upright posture. Functional ambulation focusing on upright posture with eyes looking where she is going.  Patient has met 10 of 10 long term goals due to improved activity tolerance, improved balance, postural control, functional use of  RIGHT upper, RIGHT lower, LEFT upper and LEFT lower extremity and improved attention.  Patient to discharge at overall min to mod A level.  Pt's family is unable to provide the level of physical and cognitive Assist pt needs to d/c home so pt will d/c to SNF a at this time. Pt continues to need to focus on standing tolerance, standing balance, core/trunk muscles impacting her functional mobility, initiation (currently requiring mod A), memory (currently max A), simple problem solving (with mod A), and sustained attention (currently can only hold for 2-3 min with mod cuing).  Reasons goals not met: n/a  Recommendation:  Patient will benefit from ongoing skilled OT services in skilled nursing facility setting to continue to advance functional skills in the area of BADL and Reduce care partner burden.  Equipment: No equipment provided  Reasons for discharge: treatment goals met and discharge from hospital  Patient/family agrees with progress made and goals achieved: Yes  OT Discharge Precautions/Restrictions  Precautions Precautions: Back;Fall Required Braces or Orthoses: Spinal Brace Spinal Brace: Lumbar  corset Restrictions Weight Bearing Restrictions: No General   Vital Signs Therapy Vitals Temp: 97.8 F (36.6 C) Temp src: Oral Pulse Rate: 65  Resp: 18  BP: 157/75 mmHg Patient Position, if appropriate: Lying Oxygen Therapy SpO2: 99 % O2 Device: None (Room air) Pain   ADL See FIM Vision/Perception  Vision - History Baseline Vision: No visual deficits Patient Visual Report: No change from baseline Vision - Assessment Eye Alignment: Within Functional Limits Perception Perception: Within Functional Limits Praxis Praxis Impairment Details: Initiation  Cognition Overall Cognitive Status: Impaired Arousal/Alertness: Awake/alert Orientation Level: Oriented to person;Oriented to place Attention: Sustained Focused Attention: Appears intact Sustained Attention: Impaired Sustained Attention Impairment: Verbal basic;Functional basic Selective Attention: Impaired Selective Attention Impairment: Verbal basic;Functional basic Memory: Impaired Memory Impairment: Storage deficit;Retrieval deficit;Decreased short term memory;Decreased recall of new information Decreased Long Term Memory: Verbal basic;Functional basic Decreased Short Term Memory: Verbal basic;Functional basic Awareness: Impaired Awareness Impairment: Intellectual impairment Problem Solving: Impaired Problem Solving Impairment: Verbal basic;Functional basic Executive Function: Reasoning;Sequencing;Organizing;Decision Making;Initiating Reasoning: Impaired Reasoning Impairment: Verbal basic;Functional basic Sequencing: Impaired Sequencing Impairment: Verbal basic;Functional basic Organizing: Impaired Organizing Impairment: Verbal basic;Functional basic Decision Making: Impaired Decision Making Impairment: Verbal basic;Functional basic Initiating: Impaired Initiating Impairment: Verbal basic;Functional basic Self Correcting: Impaired Self Correcting Impairment: Verbal basic;Functional  basic Sensation Sensation Light Touch: Appears Intact Stereognosis: Appears Intact Proprioception: Appears Intact Coordination Gross Motor Movements are Fluid and Coordinated: Yes Fine Motor Movements are Fluid and Coordinated: Yes (with UB) Motor  Motor Motor - Discharge Observations: weakness in core/ trunk  Mobility  Bed Mobility Rolling Right: 4: Min assist Rolling Left: 4: Min assist Right Sidelying to Sit: 4: Min assist Supine to Sit: 4: Min assist Transfers Sit to Stand:  4: Min assist Stand to Sit: 4: Min assist  Trunk/Postural Assessment  Cervical Assessment Cervical Assessment: Within Functional Limits Thoracic Assessment Thoracic Assessment: Within Functional Limits Lumbar Assessment Lumbar Assessment: Within Functional Limits Postural Control Trunk Control: improved sitting balance but still requires verbal cues to attend to posture Righting Reactions: delayed  Balance Static Sitting Balance Static Sitting - Balance Support: Feet supported Static Sitting - Level of Assistance: 5: Stand by assistance Static Standing Balance Static Standing - Balance Support: During functional activity;Bilateral upper extremity supported Static Standing - Level of Assistance: 4: Min assist Extremity/Trunk Assessment RUE Assessment RUE Assessment: Within Functional Limits LUE Assessment LUE Assessment: Within Functional Limits  See FIM for current functional status  Roney Mans [facility] 07/22/2011, 3:57 PM

## 2011-07-22 NOTE — Progress Notes (Signed)
Speech Language Pathology Daily Session Note & Discharge Sumamary  Patient Details  Name: Jean [name] MRN: [id number] Date of Birth: [birth year]  Today's Date: 07/22/2011 Time: 1130-1230 Time Calculation (min): 60 min  Short Term Goals:  SLP Short Term Goal 1 (Week 3): Pt will sustain attention to a functonal task for 2 minutes with mod verbal cueing SLP Short Term Goal 2 (Week 3): Pt will generate a response to a question with less than a 10sec delay 50% of the time. SLP Short Term Goal 3 (Week 3): Pt will utilize external memory aids to increase recall and carryover of daily information/events with Mod A multimodal cueing SLP Short Term Goal 4 (Week 3): Pt will identify 1 physical and 1 cognitive deficit with Mod A semantic cues.  SLP Short Term Goal 5 (Week 3): Pt will intiate functional tasks with Min A multimodal cueing   Skilled Therapeutic Interventions: Treatment focus on utilization of swallowing compensatory strategies with Dys. 3 textures and thin liquids, initiation and attention. Pt demonstrated an intermittent wet vocal quality X 2 that pt cleared independently with a spontaneous second swallow. Pt required Mod A verbal and visual cues to initiate meal and sustain attention to self-feeding. Pt required Max A verbal cues for intellectual awareness into cognitive deficits and no recall/carryover of morning therapy sessions.   Daily Session FIM:  Comprehension Comprehension: 4-Understands basic 75 - 89% of the time/requires cueing 10 - 24% of the time Expression Expression: 4-Expresses basic 75 - 89% of the time/requires cueing 10 - 24% of the time. Needs helper to occlude trach/needs to repeat words. Social Interaction Social Interaction: 4-Interacts appropriately 75 - 89% of the time - Needs redirection for appropriate language or to initiate interaction. Problem Solving Problem Solving: 2-Solves basic 25 - 49% of the time - needs direction more than half the time to  initiate, plan or complete simple activities Memory Memory: 2-Recognizes or recalls 25 - 49% of the time/requires cueing 51 - 75% of the time FIM - Eating Eating Activity: 5: Supervision/cues Pain No/Denies Pain Cognition:   Oral/Motor: Oral Motor/Sensory Function Overall Oral Motor/Sensory Function: Appears within functional limits for tasks assessed Motor Speech Overall Motor Speech: Appears within functional limits for tasks assessed Comprehension: Auditory Comprehension Overall Auditory Comprehension: Impaired Yes/No Questions: Within Functional Limits Complex Questions: 75-100% accurate Commands: Impaired Two Step Basic Commands: 50-74% accurate Multistep Basic Commands: 25-49% accurate Conversation: Simple Other Conversation Comments: Comprehension is impacted by pt's impaired sustained attention, decreased processing speed and impaired working memory.  Interfering Components: Attention;Working memory;Processing speed EffectiveTechniques: Dietitian: Within [employer] Reading Comprehension Reading Status: Not tested Functional Environmental (signs, name badge): Within functional limits Interfering Components: Attention;Working Dance movement psychotherapist: Verbal cueing;Visual cueing Expression: Expression Primary Mode of Expression: Verbal Verbal Expression Overall Verbal Expression: Impaired Initiation: Impaired Automatic Speech: Name;Social Response Level of Generative/Spontaneous Verbalization: Sentence;Phrase Repetition: No impairment Naming: No impairment Pragmatics: Impairment Impairments: Topic maintenance Interfering Components: Attention Effective Techniques: Semantic cues Other Verbal Expression Comments: Pt's verbal expression impacted, working memory, and impaired thought organization.  Written Expression Dominant Hand: Right Written Expression: Not tested  Therapy/Group:  Individual Therapy  Scarlet Abad 07/22/2011, 2:42 PM  Speech Language Pathology Discharge Summary  Patient Details  Name: Jean [name] MRN: [id number] Date of Birth: 12-19-1928  Today's Date: 07/22/2011 Time: 1130-1230 Time Calculation (min): 60 min  Patient has met 4 of 5 long term goals this admission.  Patient to discharge at overall Mod Assist level  in regards to functional problem solving, working memory, intellectual awareness, initiation and sustained attention.  Pt's family is unable to provide the level of physical and cognitive assist pt needs to d/c home so pt will d/c to SNF a at this time.  Reasons goals not met:  Pt continues to need to focus on initiation, working memory, simple problem solving, and sustained attention   Recommendation: Patient will benefit from ongoing skilled SLP services at the SNF to continue to maximize cognitive function and overall independence and reduce care partner burden.  Equipment: N/A  Reasons for discharge: discharge from hospital  Patient/family agrees with progress made and goals achieved: Yes  See FIM for current functional status  Tyasia Packard 07/22/2011, 2:42 PM

## 2011-07-22 NOTE — Progress Notes (Signed)
Patient pleasant, cooperative and participates with therapies. No functional changes, anticipated to discharge to SNF tomorrow

## 2011-07-22 NOTE — Progress Notes (Signed)
Recreational Therapy Discharge Summary Patient Details  Name: Jean [name] MRN: [id number] Date of Birth: [birth year] Today's Date: 07/22/2011  Long term goals set: 1  Long term goals met: 1  Comments on progress toward goals: Pt has made good progress toward goal and is currently supervision for simple tasks seated w/c level given moderate cues. Pt is being discharged to SNF for 24 hour assist and continued therapies.   Reasons for discharge: discharge from hospital  Patient/family agrees with progress made and goals achieved: Yes  Arijana Narayan 07/22/2011, 4:28 PM

## 2011-07-23 MED ORDER — METOPROLOL TARTRATE 50 MG PO TABS
150.0000 mg | ORAL_TABLET | Freq: Two times a day (BID) | ORAL | Status: DC
  Filled 2011-07-23 (×2): qty 1

## 2011-07-23 NOTE — Progress Notes (Signed)
Social Work  Discharge Note  The overall goal for the admission was met for:   Discharge location: No - plan changed to SNF  Length of Stay: Yes  Discharge activity level: Yes  Home/community participation: Yes  Services provided included: MD, RD, PT, OT, SLP, RN, CM, TR, Pharmacy and SW  Financial Services: Medicare and Private Insurance: BCBS  Follow-up services arranged: Other: SNF at [facility]  Comments (or additional information):  Patient/Family verbalized understanding of follow-up arrangements: Yes  Individual responsible for coordination of the follow-up plan: husband  Confirmed correct DME delivered: NA  Jean [name]

## 2011-07-23 NOTE — Progress Notes (Signed)
Patient ID: Jean [name], female   DOB: Aug 11, 1928, 78 y.o.   MRN: [id number] Patient ID: Jean [name], female   DOB: 09-Apr-1928, 78 y.o.   MRN: [id number] Subjective/Complaints: Good night. No new issues Ros performed and otherwise negative  Objective: Vital Signs: Blood pressure 178/74, pulse 54, temperature 97.9 F (36.6 C), temperature source Oral, resp. rate 18, height 5\' 7"  (1.702 m), weight 69.1 kg (152 lb 5.4 oz), SpO2 98.00%. No results found. No results found for this basename: WBC:2,HGB:2,HCT:2,PLT:2 in the last 72 hours No results found for this basename: NA:2,K:2,CL:2,CO2:2,GLUCOSE:2,BUN:2,CREATININE:2,CALCIUM:2 in the last 72 hours CBG (last 3)  No results found for this basename: GLUCAP:3 in the last 72 hours  Wt Readings from Last 3 Encounters:  07/22/11 69.1 kg (152 lb 5.4 oz)  07/02/11 77.8 kg (171 lb 8.3 oz)  07/02/11 77.8 kg (171 lb 8.3 oz)    Physical Exam:  General appearance: alert, cooperative, appears stated age and no distress Head: Normocephalic, without obvious abnormality, atraumatic Eyes: conjunctivae/corneas clear. PERRL, EOM's intact.  Ears: normal external ear canals both ears Nose: Nares normal. Septum midline. Mucosa normal. No drainage or sinus tenderness. Throat: lips, mucosa, and tongue normal; teeth and gums normal Neck: no adenopathy, no carotid bruit, no JVD, supple, symmetrical, trachea midline and thyroid not enlarged, symmetric, no tenderness/mass/nodules Back: tender superior to incision. posture poor.  Resp: clear to auscultation bilaterally, no obvious wheezes or rales Cardio: regular rate and rhythm, S1, S2 normal, no murmur, click, rub or gallop GI: soft, non-tender; bowel sounds normal; no masses,  no organomegaly Extremities: extremities normal, atraumatic, no cyanosis or edema Pulses: 1+ edema lue Skin: Skin color, texture, turgor normal. No rashes or lesions Neurologic: fairly alert. Gross, generalized weakness--let more than  right (inconsistent) no focal CN findings. Limited insight and awareness. Short term memory poor.  Oriented to place. Follows simple commands. 4/5 Strength BUE and BLE Incision/Wound: clean and intact   Assessment/Plan: 1. Functional deficits secondary to lumbar stenosis, radiculopathy, with post op watershed infarct which require 3+ hours per day of interdisciplinary therapy in a comprehensive inpatient rehab setting. Physiatrist is providing close team supervision and 24 hour management of active medical problems listed below. Physiatrist and rehab team continue to assess barriers to discharge/monitor patient progress toward functional and medical goals.  To SNF  FIM: FIM - Bathing Bathing Steps Patient Completed: Chest;Right Arm;Left Arm;Abdomen Bathing: 1: Two helpers  FIM - Upper Body Dressing/Undressing Upper body dressing/undressing steps patient completed: Thread/unthread right sleeve of pullover shirt/dresss;Thread/unthread left sleeve of pullover shirt/dress;Put head through opening of pull over shirt/dress;Pull shirt over trunk Upper body dressing/undressing: 5: Set-up assist to: Apply TLSO, cervical collar FIM - Lower Body Dressing/Undressing Lower body dressing/undressing steps patient completed: Thread/unthread right pants leg;Thread/unthread left pants leg;Pull pants up/down;Don/Doff right sock;Don/Doff left sock;Don/Doff right shoe;Don/Doff left shoe Lower body dressing/undressing: 4: Min-Patient completed 75 plus % of tasks  FIM - Toileting Toileting steps completed by patient: Performs perineal hygiene Toileting: 2: Max-Patient completed 1 of 3 steps  FIM - Diplomatic Services operational officer Devices: Bedside commode Toilet Transfers: 2-To toilet/BSC: Max A (lift and lower assist);2-From toilet/BSC: Max A (lift and lower assist)  FIM - Press photographer Assistive Devices: Arm rests Bed/Chair Transfer: 4: Supine > Sit: Min A (steadying Pt.  > 75%/lift 1 leg);4: Bed > Chair or W/C: Min A (steadying Pt. > 75%)  FIM - Locomotion: Wheelchair Distance: 300 Locomotion: Wheelchair: 2: Travels 50 - 149 ft with supervision,  cueing or coaxing FIM - Locomotion: Ambulation Locomotion: Ambulation Assistive Devices: Walker - Rolling Ambulation/Gait Assistance: 4: Min guard Locomotion: Ambulation: 2: Travels 50 - 149 ft with minimal assistance (Pt.>75%)  Comprehension Comprehension Mode: Auditory Comprehension: 4-Understands basic 75 - 89% of the time/requires cueing 10 - 24% of the time  Expression Expression Mode: Verbal Expression: 4-Expresses basic 75 - 89% of the time/requires cueing 10 - 24% of the time. Needs helper to occlude trach/needs to repeat words.  Social Interaction Social Interaction: 4-Interacts appropriately 75 - 89% of the time - Needs redirection for appropriate language or to initiate interaction.  Problem Solving Problem Solving: 2-Solves basic 25 - 49% of the time - needs direction more than half the time to initiate, plan or complete simple activities  Memory Memory: 2-Recognizes or recalls 25 - 49% of the time/requires cueing 51 - 75% of the time  1. Lumbar stenosis with radiculopathy. Status post bilateral laminotomies with decompression and removal of previous hardware. Complicated by acute arterial bleeding with vascular repair 06/23/2011  2. 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 Continue aspirin Plavix. Carotid Dopplers are pending  3. DVT Prophylaxis/Anticoagulation: SCDs. Monitor for any signs of deep vein thrombosis  4. Hypertension. aldomet 250 mg twice a day. Increased lopressor back to home dose of 150mg  bid. 5. Hypothyroidism. Synthroid  6. Mood/postoperative confusion. Improved with limited insight and awareness 7. FEN: mild renal insufficiency  -push po fluids  -BUN and CR generally stable  -continue to hold lasix  -bmet stable to improved  -added megace  for appetite- intake as a whole is much better- can wean as an outpt 8.  CHF exacerbation EF is 40-45%- lasix added with good results but held   -monitor i's and o's as well as renal fxn  9. E COli UTI- keflex completed.  -now has enterococcus UTI- begin cipro x 7days LOS (Days) 21 A FACE TO FACE EVALUATION WAS PERFORMED  Kamil Mchaffie T 07/23/2011, 7:59 AM

## 2011-07-23 NOTE — Progress Notes (Signed)
Social Work Patient ID: Jean [name], female   DOB: 07-11-1928, 78 y.o.   MRN: [id number]    Met yesterday with pt, husband and daughters to review SNF offers received from 3 area facilities.  They have accepted offer from [facility] with plans to transfer today after lunch via ambulance.   During discussion yesterday with patient, she was a little tearful about not going directly home, yet says she understands need to extend therapy via SNF placement.  Agreeable with plan for SNF today.  Jacen Carlini

## 2011-07-23 NOTE — Patient Care Conference (Signed)
Inpatient RehabilitationTeam Conference Note Date: 07/22/2011   Time: 2:50 PM    Patient Name: Jean [name]      Medical Record Number: [id number]  Date of Birth: 1928-12-13 Sex: Female         Room/Bed: 4155/4155-01 Payor Info: Payor: MEDICARE  Plan: MEDICARE PART A AND B  Product Type: *No Product type*     Admitting Diagnosis: Decompf/ fusion, new cva  Admit Date/Time:  07/02/2011  4:37 PM Admission Comments: No comment available   Primary Diagnosis:  Lumbar radiculopathy Principal Problem: Lumbar radiculopathy  Patient Active Problem List  Diagnoses Date Noted  . Lumbar radiculopathy 07/03/2011  . Pulmonary edema 06/30/2011  . Hypokalemia 06/30/2011  . Delirium, acute 06/25/2011  . Status post lumbar surgery 06/23/2011  . Aortic injury, S/P vascular surgery 06/23/2011  . ARF (acute renal failure) 06/23/2011  . LBP (low back pain) 06/04/2011  . Hip pain 03/05/2011  . UNSPECIFIED CLOSED FRACTURE OF ANKLE 05/16/2010  . ANKLE SPRAIN, RIGHT 05/16/2010  . WRIST PAIN 05/14/2010  . ANKLE PAIN 05/14/2010  . FIBROCYSTIC BREAST DISEASE 03/14/2010  . CORONARY ATHEROSCLEROSIS NATIVE CORONARY ARTERY 11/15/2009  . GASTROENTERITIS 06/05/2009  . SYNCOPE 06/05/2009  . INGROWN TOENAIL 02/05/2009  . RENAL INSUFFICIENCY 12/13/2007  . VITAMIN D DEFICIENCY 08/11/2007  . CRAMPS,LEG 06/08/2007  . COUGH 05/26/2007  . HYPERLIPIDEMIA 12/28/2006  . HYPERTENSION 12/28/2006  . CORONARY ARTERY DISEASE 12/28/2006  . OSTEOARTHRITIS 12/28/2006  . HYPOTHYROIDISM, UNSPECIFIED 12/23/2006    Expected Discharge Date: Expected Discharge Date: 07/23/11 (SNF)  Team Members Present: Physician: Dr. Faith Rogue Case Manager Present: Melanee Spry, RN Social Worker Present: Amada Jupiter, LCSW Nurse Present: Carmie End, RN PT Present: Other (comment) Sherrine Maples) OT Present: Bretta Bang, OT;Jennifer Katrinka Blazing, OT SLP Present: Feliberto Gottron, SLP Other (Discipline and Name): Tora Duck, PPS  Coordinator     Current Status/Progress Goal Weekly Team Focus  Medical   enterococcus uti, eating better  see prior  see prior   Bowel/Bladder   continent with time toileting   continue to manage with time toileting q 3hr      Swallow/Nutrition/ Hydration   Dys. 3 textures, thin liquids, Intermittent supervision  supervision  initiation and attention to meal   ADL's   supervision for UB, min for LB, sit to stand with min A, transfers with min A with RW  min A overall  attention, initiation, simple problem solving, posture   Mobility   min-mod A  min A  initiation, attention, balance reactions   Communication   Min A  Min A  expression of wants/needs   Safety/Cognition/ Behavioral Observations  Max A  Mod A  attention, awareness, initiation    Pain   n/a  <3      Skin   midline incision with staple   free of infection         *See Interdisciplinary Assessment and Plan and progress notes for long and short-term goals  Barriers to Discharge: neuro-cognitive deficits    Possible Resolutions to Barriers:  supervision and 24 hour assist    Discharge Planning/Teaching Needs:  SNF likely 07/23/11      Team Discussion: Family & pt to choose which SNF bed offer to accept--plan for d/c SNF 07/23/11.   Revisions to Treatment Plan: none    Continued Need for Acute Rehabilitation Level of Care: The patient requires daily medical management by a physician with specialized training in physical medicine and rehabilitation for the following conditions: Daily direction of a  multidisciplinary physical rehabilitation program to ensure safe treatment while eliciting the highest outcome that is of practical value to the patient.: Yes Daily medical management of patient stability for increased activity during participation in an intensive rehabilitation regime.: Yes Daily analysis of laboratory values and/or radiology reports with any subsequent need for medication adjustment of medical  intervention for : Neurological problems;Post surgical problems;Other  Brock Ra 07/23/2011, 10:10 AM

## 2011-07-23 NOTE — Progress Notes (Signed)
Physical Therapy Discharge Summary  Patient Details  Name: Jean [name] MRN: [id number] Date of Birth: 1928-03-31  Today's Date: 07/23/2011  Patient has met 10 of 12 long term goals due to improved activity tolerance, improved balance, improved postural control, increased strength, improved awareness and improved coordination.  Patient to discharge at an ambulatory level Min Assist.   Pt is now supervision-min A for scooting transfers, min A for sit to stands and gait short distances.  Pt able to propel w/c in controlled environment with supervision.  Pt is limited by decreased sustained attention, decreased initiation, decreased strength and balance, impaired balance reactions and decreased activity tolerance.   Reasons goals not met: pt continues to need increased cuing for sustained attention and memory   Recommendation:  Patient will benefit from ongoing skilled PT services in skilled nursing facility setting to continue to advance safe functional mobility, address ongoing impairments in attention, awareness, functional mobility, motor planning, strength, balance, gait, initiation, and minimize fall risk.  Equipment: To be provided by SNF  Reasons for discharge: treatment goals met and discharge from hospital  Patient/family agrees with progress made and goals achieved: Yes  PT Discharge  Cognition Overall Cognitive Status: Impaired Sustained Attention: Impaired Memory: Impaired Awareness: Impaired Awareness Impairment: Intellectual impairment Initiating: Impaired Sensation Sensation Light Touch: Appears Intact Proprioception: Appears Intact Motor  Motor Motor - Discharge Observations: weakness in core/trunk   Balance Static Sitting Balance Static Sitting - Level of Assistance: 5: Stand by assistance Static Standing Balance Static Standing - Level of Assistance: 4: Min assist Extremity Assessment      RLE Strength RLE Overall Strength Comments: overall 3/5 LLE  Strength LLE Overall Strength Comments: overall 3/5  See FIM for current functional status  Violanda Bobeck 07/23/2011, 3:54 PM

## 2011-07-23 NOTE — Progress Notes (Signed)
Patient discharged to [facility] by EMS transport. Patient belongings taken by patient's family. Pt picked up by EMS at 1405. Jean [name], Jean [name]

## 2011-08-15 ENCOUNTER — Encounter: Payer: Medicare Other | Admitting: Physical Medicine & Rehabilitation

## 2011-09-09 ENCOUNTER — Ambulatory Visit: Payer: Medicare Other | Admitting: Internal Medicine

## 2011-09-30 ENCOUNTER — Encounter (INDEPENDENT_AMBULATORY_CARE_PROVIDER_SITE_OTHER): Payer: Medicare Other

## 2011-09-30 DIAGNOSIS — I6789 Other cerebrovascular disease: Secondary | ICD-10-CM

## 2011-10-06 ENCOUNTER — Telehealth: Payer: Self-pay | Admitting: *Deleted

## 2011-10-06 NOTE — Telephone Encounter (Signed)
Pt is being d/c from [facility] on 10/10/11. Her spouse would like to know which HH you recommend for PT, OT and speech therapy. Please advise.

## 2011-10-06 NOTE — Telephone Encounter (Signed)
Try [facility] Thx

## 2011-10-07 NOTE — Telephone Encounter (Signed)
Pt's spouse informed by Lamar Sprinkles and Victorino Dike, registrar.

## 2011-10-15 ENCOUNTER — Telehealth: Payer: Self-pay

## 2011-10-15 NOTE — Telephone Encounter (Signed)
Ok Thx 

## 2011-10-15 NOTE — Telephone Encounter (Signed)
PT called requesting verbal orders for [facility] Physical Therapy - 3/week x 2 weeks then 2/week x 6 weeks for balance, gait and posture deficits.

## 2011-10-16 NOTE — Telephone Encounter (Signed)
[name] advised via secure VM

## 2011-11-10 ENCOUNTER — Other Ambulatory Visit: Payer: Self-pay | Admitting: Internal Medicine

## 2011-11-17 ENCOUNTER — Other Ambulatory Visit (INDEPENDENT_AMBULATORY_CARE_PROVIDER_SITE_OTHER): Payer: Medicare Other

## 2011-11-17 ENCOUNTER — Ambulatory Visit (INDEPENDENT_AMBULATORY_CARE_PROVIDER_SITE_OTHER): Payer: Medicare Other | Admitting: Internal Medicine

## 2011-11-17 ENCOUNTER — Encounter: Payer: Self-pay | Admitting: Internal Medicine

## 2011-11-17 VITALS — BP 152/78 | HR 56 | Temp 97.3°F | Resp 14 | Wt 155.2 lb

## 2011-11-17 DIAGNOSIS — R29898 Other symptoms and signs involving the musculoskeletal system: Secondary | ICD-10-CM

## 2011-11-17 DIAGNOSIS — N259 Disorder resulting from impaired renal tubular function, unspecified: Secondary | ICD-10-CM

## 2011-11-17 DIAGNOSIS — IMO0002 Reserved for concepts with insufficient information to code with codable children: Secondary | ICD-10-CM

## 2011-11-17 DIAGNOSIS — M5416 Radiculopathy, lumbar region: Secondary | ICD-10-CM

## 2011-11-17 LAB — URINALYSIS, ROUTINE W REFLEX MICROSCOPIC
Ketones, ur: NEGATIVE
Specific Gravity, Urine: 1.015 (ref 1.000–1.030)
Urobilinogen, UA: 0.2 (ref 0.0–1.0)

## 2011-11-17 LAB — CBC WITH DIFFERENTIAL/PLATELET
Basophils Absolute: 0 10*3/uL (ref 0.0–0.1)
Eosinophils Absolute: 0.1 10*3/uL (ref 0.0–0.7)
Hemoglobin: 12.9 g/dL (ref 12.0–15.0)
Lymphocytes Relative: 19.6 % (ref 12.0–46.0)
MCHC: 32.6 g/dL (ref 30.0–36.0)
Neutro Abs: 5.8 10*3/uL (ref 1.4–7.7)
Neutrophils Relative %: 69 % (ref 43.0–77.0)
Platelets: 224 10*3/uL (ref 150.0–400.0)
RDW: 12.9 % (ref 11.5–14.6)

## 2011-11-17 LAB — BASIC METABOLIC PANEL
BUN: 29 mg/dL — ABNORMAL HIGH (ref 6–23)
CO2: 26 mEq/L (ref 19–32)
Calcium: 10 mg/dL (ref 8.4–10.5)
Creatinine, Ser: 1.8 mg/dL — ABNORMAL HIGH (ref 0.4–1.2)
Glucose, Bld: 101 mg/dL — ABNORMAL HIGH (ref 70–99)

## 2011-11-17 MED ORDER — CIPROFLOXACIN HCL 250 MG PO TABS: 250.0000 mg | ORAL_TABLET | Freq: Two times a day (BID) | ORAL | Status: DC

## 2011-11-17 NOTE — Patient Instructions (Signed)
It was good to see you today. We have reviewed your prior records including labs and tests today Cipro antibiotics 2x/day x 1 week for possible bladder infection - Your prescription(s) have been submitted to your pharmacy. Please take as directed and contact our office if you believe you are having problem(s) with the medication(s). Other Medications reviewed and updated. Test(s) ordered today. Your results will be called to you after review (48-72hours after test completion). If any changes need to be made, you will be notified at that time. Continue working with home health physical therapy as ongoing 2 or 3 days a week

## 2011-11-17 NOTE — Assessment & Plan Note (Signed)
Check labs now.

## 2011-11-17 NOTE — Progress Notes (Signed)
Subjective:    Patient ID: Jean [name], female    DOB: 1928/04/05, 78 y.o.   MRN: [id number]  HPI  Complains of generalized weakness in legs Onset 3 days ago Denies association with pain or swelling No falls, syncope or dizziness Has been working with physical therapy since back surgery in April -first in inpatient rehabilitation, then at [facility] skilled nursing rehabilitation x90 days, home August 22 Onset of symptoms while standing at sink, no problems walking with walker or working with home health therapy  Past Medical History  Diagnosis Date  . Hyperlipidemia   . Hypothyroid     hypo  . CHF (congestive heart failure)   . Mitral regurgitation   . Tricuspid regurgitation   . Anemia   . Arthritis     Osteoarthtistis  . Elevated glucose   . CVA (cerebral infarction)   . Vitamin d deficiency   . Myocardial infarction   . Chronic kidney failure     stage 3  . CAD (coronary artery disease)     sees Dr. Ricka Burdock last 03/2011  . Hypertension     Dr. Eden Emms cardiologist, Dr. Cheron Schaumann primary doctor    Review of Systems  Constitutional: Positive for fatigue. Negative for fever and unexpected weight change.  Respiratory: Negative for cough and shortness of breath.   Cardiovascular: Negative for chest pain and palpitations.  Musculoskeletal: Positive for gait problem. Negative for myalgias, joint swelling and arthralgias.  Neurological: Positive for weakness. Negative for dizziness, syncope, facial asymmetry, speech difficulty, light-headedness and numbness.       Objective:   Physical Exam BP 152/78  Pulse 56  Temp 97.3 F (36.3 C) (Oral)  Resp 14  Wt 155 lb 4 oz (70.421 kg)  SpO2 95% Wt Readings from Last 3 Encounters:  11/17/11 155 lb 4 oz (70.421 kg)  07/22/11 152 lb 5.4 oz (69.1 kg)  07/02/11 171 lb 8.3 oz (77.8 kg)   Constitutional: She appears well-developed and well-nourished. No distress. Spouse at side  Neck: Normal range of motion. Neck  supple. No JVD present. No thyromegaly present.  Cardiovascular: Normal rate, regular rhythm and normal heart sounds.  No murmur heard. No BLE edema. Pulmonary/Chest: Effort normal and breath sounds normal. No respiratory distress. She has no wheezes.  Musculoskeletal: Back: full range of motion of thoracic and lumbar spine. Non tender to palpation. DTR's are symmetrically intact. Sensation intact in all dermatomes of the lower extremities. Full strength to manual muscle testing. patient is able to walk with RW and ambulates with cautious gait. Normal range of motion, no joint effusions. No gross deformities Neurological: She is alert and oriented to person, place, and time. No cranial nerve deficit. Coordination normal - walks with RW.  Skin: Skin is warm and dry. No rash noted. No erythema.  Psychiatric: She has a normal mood and affect. Her behavior is normal. Judgment and thought content normal.   Lab Results  Component Value Date   WBC 11.4* 07/06/2011   HGB 11.4* 07/06/2011   HCT 34.5* 07/06/2011   PLT 353 07/06/2011   GLUCOSE 99 07/20/2011   CHOL 154 07/02/2011   TRIG 117 07/02/2011   HDL 28* 07/02/2011   LDLDIRECT 123.1 06/01/2009   LDLCALC 103* 07/02/2011   ALT 25 07/03/2011   AST 34 07/03/2011   NA 139 07/20/2011   K 4.3 07/20/2011   CL 105 07/20/2011   CREATININE 1.46* 07/20/2011   BUN 24* 07/20/2011   CO2 21 07/20/2011   TSH  2.674 06/25/2011   INR 1.29 06/23/2011   HGBA1C 5.7* 07/01/2011   MICROALBUR 1.3 08/17/2006        Assessment & Plan:  Generalized weakness, reports sensation in legs but no neuro deficit ?UTI -check urinalysis and other labs Treat empirically with Cipro Hemodynamically and neuro exam stable Continue working with home health therapy

## 2011-11-17 NOTE — Assessment & Plan Note (Signed)
S/p lumbar surgical intervention April 2013- dr Venetia Maxon Status post inpatient rehabilitation and skilled nursing facility for deconditioning related to same -home since August 22 Has been working with home health therapy, reportedly stable and improved until a few days ago Rule out acute bladder infection or other metabolic problem with workup as described above Continue home health therapy and use of rolling walker as ongoing No new neurologic deficits on exam today

## 2011-12-08 ENCOUNTER — Other Ambulatory Visit (INDEPENDENT_AMBULATORY_CARE_PROVIDER_SITE_OTHER): Payer: Medicare Other

## 2011-12-08 DIAGNOSIS — I2510 Atherosclerotic heart disease of native coronary artery without angina pectoris: Secondary | ICD-10-CM

## 2011-12-08 DIAGNOSIS — N259 Disorder resulting from impaired renal tubular function, unspecified: Secondary | ICD-10-CM

## 2011-12-08 DIAGNOSIS — E039 Hypothyroidism, unspecified: Secondary | ICD-10-CM

## 2011-12-08 DIAGNOSIS — M545 Low back pain: Secondary | ICD-10-CM

## 2011-12-08 DIAGNOSIS — I10 Essential (primary) hypertension: Secondary | ICD-10-CM

## 2011-12-08 DIAGNOSIS — E785 Hyperlipidemia, unspecified: Secondary | ICD-10-CM

## 2011-12-08 DIAGNOSIS — E559 Vitamin D deficiency, unspecified: Secondary | ICD-10-CM

## 2011-12-08 LAB — BASIC METABOLIC PANEL
CO2: 26 mEq/L (ref 19–32)
Calcium: 9.9 mg/dL (ref 8.4–10.5)
Glucose, Bld: 129 mg/dL — ABNORMAL HIGH (ref 70–99)
Potassium: 4.2 mEq/L (ref 3.5–5.1)
Sodium: 138 mEq/L (ref 135–145)

## 2011-12-08 LAB — LIPID PANEL
HDL: 47.1 mg/dL (ref >39.00)
Total CHOL/HDL Ratio: 4
VLDL: 38.6 mg/dL (ref 0.0–40.0)

## 2011-12-08 LAB — HEPATIC FUNCTION PANEL: Total Bilirubin: 0.4 mg/dL (ref 0.3–1.2)

## 2011-12-09 ENCOUNTER — Telehealth: Payer: Self-pay | Admitting: Internal Medicine

## 2011-12-09 MED ORDER — ERGOCALCIFEROL 1.25 MG (50000 UT) PO CAPS: 50000.0000 [IU] | ORAL_CAPSULE | ORAL | Status: DC

## 2011-12-09 NOTE — Telephone Encounter (Signed)
Pt's spouse informed 

## 2011-12-09 NOTE — Telephone Encounter (Signed)
Misty Stanley, please, inform patient that all labs are normal except for low vit D and diminished kidney function. - drink more water -take Rx Vit D Thx

## 2011-12-10 ENCOUNTER — Encounter: Payer: Self-pay | Admitting: Internal Medicine

## 2011-12-10 ENCOUNTER — Ambulatory Visit (INDEPENDENT_AMBULATORY_CARE_PROVIDER_SITE_OTHER): Payer: Medicare Other | Admitting: Internal Medicine

## 2011-12-10 VITALS — BP 160/80 | HR 76 | Temp 97.5°F | Resp 16 | Wt 154.0 lb

## 2011-12-10 DIAGNOSIS — M545 Low back pain, unspecified: Secondary | ICD-10-CM

## 2011-12-10 DIAGNOSIS — E876 Hypokalemia: Secondary | ICD-10-CM

## 2011-12-10 DIAGNOSIS — I10 Essential (primary) hypertension: Secondary | ICD-10-CM

## 2011-12-10 DIAGNOSIS — R279 Unspecified lack of coordination: Secondary | ICD-10-CM

## 2011-12-10 DIAGNOSIS — I2510 Atherosclerotic heart disease of native coronary artery without angina pectoris: Secondary | ICD-10-CM

## 2011-12-10 DIAGNOSIS — Z9181 History of falling: Secondary | ICD-10-CM

## 2011-12-10 DIAGNOSIS — R270 Ataxia, unspecified: Secondary | ICD-10-CM

## 2011-12-10 DIAGNOSIS — R55 Syncope and collapse: Secondary | ICD-10-CM

## 2011-12-10 DIAGNOSIS — R296 Repeated falls: Secondary | ICD-10-CM | POA: Insufficient documentation

## 2011-12-10 DIAGNOSIS — R413 Other amnesia: Secondary | ICD-10-CM

## 2011-12-10 DIAGNOSIS — N179 Acute kidney failure, unspecified: Secondary | ICD-10-CM

## 2011-12-10 NOTE — Assessment & Plan Note (Signed)
2013 poss dementia, worse following her back surgery

## 2011-12-10 NOTE — Progress Notes (Signed)
  Subjective:    Patient ID: Jean [name], female    DOB: 03-17-1928, 78 y.o.   MRN: [id number]  HPI F/u on LBP - s/p surgery; complicated recovery... C/o falling - tripping over things, no LOC... The patient presents for a follow-up of  chronic hypertension, chronic dyslipidemia, CAD controlled with medicines  C/o memory issues - worse  Wt Readings from Last 3 Encounters:  12/10/11 154 lb (69.854 kg)  11/17/11 155 lb 4 oz (70.421 kg)  07/22/11 152 lb 5.4 oz (69.1 kg)   BP Readings from Last 3 Encounters:  12/10/11 160/80  11/17/11 152/78  07/23/11 178/74       Review of Systems  Constitutional: Negative for chills, activity change, appetite change, fatigue and unexpected weight change.  HENT: Negative for congestion, mouth sores and sinus pressure.   Eyes: Negative for visual disturbance.  Respiratory: Negative for cough and chest tightness.   Gastrointestinal: Negative for nausea and abdominal pain.  Genitourinary: Negative for frequency, difficulty urinating and vaginal pain.  Musculoskeletal: Positive for gait problem (foot bain is better). Negative for back pain.  Skin: Negative for pallor and rash.  Neurological: Negative for dizziness, tremors, weakness, numbness and headaches.  Psychiatric/Behavioral: Negative for confusion and disturbed wake/sleep cycle.       Objective:   Physical Exam  Constitutional: She appears well-developed and well-nourished. No distress.  HENT:  Head: Normocephalic.  Right Ear: External ear normal.  Left Ear: External ear normal.  Nose: Nose normal.  Mouth/Throat: Oropharynx is clear and moist.  Eyes: Conjunctivae normal are normal. Pupils are equal, round, and reactive to light. Right eye exhibits no discharge. Left eye exhibits no discharge.  Neck: Normal range of motion. Neck supple. No JVD present. No tracheal deviation present. No thyromegaly present.  Cardiovascular: Normal rate, regular rhythm and normal heart sounds.     Pulmonary/Chest: No stridor. No respiratory distress. She has no wheezes.  Abdominal: Soft. Bowel sounds are normal. She exhibits no distension and no mass. There is no tenderness. There is no rebound and no guarding.  Musculoskeletal: She exhibits no edema and no tenderness.  Lymphadenopathy:    She has no cervical adenopathy.  Neurological: She displays normal reflexes. No cranial nerve deficit. She exhibits normal muscle tone. Coordination normal.  Skin: No rash noted. No erythema.  Psychiatric: She has a normal mood and affect. Her behavior is normal. Judgment and thought content normal.   Lab Results  Component Value Date   WBC 8.4 11/17/2011   HGB 12.9 11/17/2011   HCT 39.8 11/17/2011   PLT 224.0 11/17/2011   GLUCOSE 129* 12/08/2011   CHOL 179 12/08/2011   TRIG 193.0* 12/08/2011   HDL 47.10 12/08/2011   LDLDIRECT 123.1 06/01/2009   LDLCALC 93 12/08/2011   ALT 10 12/08/2011   AST 27 12/08/2011   NA 138 12/08/2011   K 4.2 12/08/2011   CL 104 12/08/2011   CREATININE 1.9* 12/08/2011   BUN 29* 12/08/2011   CO2 26 12/08/2011   TSH 2.674 06/25/2011   INR 1.29 06/23/2011   HGBA1C 5.7* 07/01/2011   MICROALBUR 1.3 08/17/2006          Assessment & Plan:

## 2011-12-10 NOTE — Assessment & Plan Note (Signed)
Monitoring

## 2011-12-10 NOTE — Assessment & Plan Note (Signed)
No relapse 

## 2011-12-10 NOTE — Assessment & Plan Note (Signed)
Walker S/p PT

## 2011-12-10 NOTE — Assessment & Plan Note (Signed)
Continue with current prescription therapy as reflected on the Med list.  

## 2011-12-10 NOTE — Assessment & Plan Note (Signed)
Severe in 2013 S/p surgery - complicated recovery Finished PT at home, using a walker

## 2011-12-10 NOTE — Assessment & Plan Note (Signed)
Recheck BMET 

## 2011-12-11 ENCOUNTER — Telehealth: Payer: Self-pay | Admitting: Internal Medicine

## 2011-12-11 NOTE — Telephone Encounter (Signed)
[name] is a physical therapist with [facility] and he is requesting an order to continue PT because the patient has had several falls lately

## 2011-12-11 NOTE — Telephone Encounter (Signed)
Ok Thx 

## 2011-12-12 ENCOUNTER — Encounter: Payer: Self-pay | Admitting: Internal Medicine

## 2011-12-15 ENCOUNTER — Other Ambulatory Visit: Payer: Self-pay | Admitting: Internal Medicine

## 2011-12-16 NOTE — Telephone Encounter (Signed)
[name] informed of below.

## 2012-01-06 DIAGNOSIS — I509 Heart failure, unspecified: Secondary | ICD-10-CM

## 2012-01-06 DIAGNOSIS — IMO0001 Reserved for inherently not codable concepts without codable children: Secondary | ICD-10-CM

## 2012-01-06 DIAGNOSIS — R269 Unspecified abnormalities of gait and mobility: Secondary | ICD-10-CM

## 2012-01-06 DIAGNOSIS — I6990 Unspecified sequelae of unspecified cerebrovascular disease: Secondary | ICD-10-CM

## 2012-01-06 DIAGNOSIS — H543 Unqualified visual loss, both eyes: Secondary | ICD-10-CM

## 2012-01-07 ENCOUNTER — Encounter: Payer: Self-pay | Admitting: Gastroenterology

## 2012-01-20 ENCOUNTER — Other Ambulatory Visit: Payer: Self-pay | Admitting: Internal Medicine

## 2012-01-23 ENCOUNTER — Encounter: Payer: Self-pay | Admitting: Internal Medicine

## 2012-01-23 ENCOUNTER — Ambulatory Visit (INDEPENDENT_AMBULATORY_CARE_PROVIDER_SITE_OTHER): Payer: Medicare Other | Admitting: Internal Medicine

## 2012-01-23 ENCOUNTER — Other Ambulatory Visit (INDEPENDENT_AMBULATORY_CARE_PROVIDER_SITE_OTHER): Payer: Medicare Other

## 2012-01-23 VITALS — BP 180/90 | HR 76 | Temp 98.5°F | Resp 16 | Wt 154.0 lb

## 2012-01-23 DIAGNOSIS — F29 Unspecified psychosis not due to a substance or known physiological condition: Secondary | ICD-10-CM

## 2012-01-23 DIAGNOSIS — M545 Low back pain, unspecified: Secondary | ICD-10-CM

## 2012-01-23 DIAGNOSIS — M25571 Pain in right ankle and joints of right foot: Secondary | ICD-10-CM

## 2012-01-23 DIAGNOSIS — S93409A Sprain of unspecified ligament of unspecified ankle, initial encounter: Secondary | ICD-10-CM

## 2012-01-23 DIAGNOSIS — M25579 Pain in unspecified ankle and joints of unspecified foot: Secondary | ICD-10-CM

## 2012-01-23 DIAGNOSIS — R410 Disorientation, unspecified: Secondary | ICD-10-CM

## 2012-01-23 DIAGNOSIS — E876 Hypokalemia: Secondary | ICD-10-CM

## 2012-01-23 DIAGNOSIS — R413 Other amnesia: Secondary | ICD-10-CM

## 2012-01-23 DIAGNOSIS — I10 Essential (primary) hypertension: Secondary | ICD-10-CM

## 2012-01-23 LAB — BASIC METABOLIC PANEL
BUN: 28 mg/dL — ABNORMAL HIGH (ref 6–23)
CO2: 27 mEq/L (ref 19–32)
Calcium: 9.9 mg/dL (ref 8.4–10.5)
Chloride: 104 mEq/L (ref 96–112)
Creatinine, Ser: 1.6 mg/dL — ABNORMAL HIGH (ref 0.4–1.2)
GFR: 31.9 mL/min — ABNORMAL LOW (ref >60.00)
Glucose, Bld: 112 mg/dL — ABNORMAL HIGH (ref 70–99)
Potassium: 4.9 mEq/L (ref 3.5–5.1)
Sodium: 139 mEq/L (ref 135–145)

## 2012-01-23 LAB — URINALYSIS, ROUTINE W REFLEX MICROSCOPIC
Bilirubin Urine: NEGATIVE
Ketones, ur: NEGATIVE
Nitrite: POSITIVE
Specific Gravity, Urine: 1.025 (ref 1.000–1.030)
Total Protein, Urine: 30
Urine Glucose: NEGATIVE
Urobilinogen, UA: 0.2 (ref 0.0–1.0)
pH: 6 (ref 5.0–8.0)

## 2012-01-23 LAB — CBC WITH DIFFERENTIAL/PLATELET
Basophils Absolute: 0 10*3/uL (ref 0.0–0.1)
Basophils Relative: 0.5 % (ref 0.0–3.0)
Hemoglobin: 13.1 g/dL (ref 12.0–15.0)
Lymphocytes Relative: 19 % (ref 12.0–46.0)
Monocytes Relative: 10 % (ref 3.0–12.0)
Neutro Abs: 4.4 10*3/uL (ref 1.4–7.7)
Neutrophils Relative %: 69.7 % (ref 43.0–77.0)
RBC: 4.53 Mil/uL (ref 3.87–5.11)

## 2012-01-23 LAB — HEPATIC FUNCTION PANEL
ALT: 19 U/L (ref 0–35)
AST: 32 U/L (ref 0–37)
Albumin: 4.1 g/dL (ref 3.5–5.2)
Alkaline Phosphatase: 99 U/L (ref 39–117)
Bilirubin, Direct: 0.1 mg/dL (ref 0.0–0.3)
Total Bilirubin: 0.5 mg/dL (ref 0.3–1.2)
Total Protein: 8.1 g/dL (ref 6.0–8.3)

## 2012-01-23 LAB — CK: Total CK: 32 U/L (ref 7–177)

## 2012-01-23 MED ORDER — CIPROFLOXACIN HCL 250 MG PO TABS: 250.0000 mg | ORAL_TABLET | Freq: Two times a day (BID) | ORAL | Status: DC

## 2012-01-23 NOTE — Assessment & Plan Note (Signed)
ACE wrap X ray if not better 

## 2012-01-23 NOTE — Assessment & Plan Note (Signed)
R/o UTI CT head Empiric Cipro Hydrate well Labs To ER if not better

## 2012-01-23 NOTE — Assessment & Plan Note (Signed)
Continue with current prescription therapy as reflected on the Med list.  

## 2012-01-23 NOTE — Assessment & Plan Note (Addendum)
In PT S/p surgery - complicated recovery  UA

## 2012-01-23 NOTE — Patient Instructions (Signed)
Go to ER if worse 

## 2012-01-23 NOTE — Progress Notes (Signed)
Subjective:    Patient ID: Jean [name], female    DOB: 1928/09/11, 78 y.o.   MRN: [id number]  C/o falling twice since last night, weak legs, confusion, slow speech. No LOC. No HA. No neck pain  Fall The accident occurred 12 to 24 hours ago. The fall occurred while walking. She landed on carpet. Pertinent negatives include no abdominal pain, fever, headaches, nausea or numbness.  Back Pain This is a chronic problem. The problem has been rapidly worsening since onset. Pertinent negatives include no abdominal pain, fever, headaches, numbness or weakness.   F/u on LBP - s/p surgery; CVA complicated her recovery... C/o falling - tripping over things, no LOC... The patient presents for a follow-up of  chronic hypertension, chronic dyslipidemia, CAD controlled with medicines  C/o memory issues - worse  Wt Readings from Last 3 Encounters:  01/23/12 154 lb (69.854 kg)  12/10/11 154 lb (69.854 kg)  11/17/11 155 lb 4 oz (70.421 kg)   BP Readings from Last 3 Encounters:  01/23/12 180/90  12/10/11 160/80  11/17/11 152/78       Review of Systems  Constitutional: Negative for fever, chills, activity change, appetite change, fatigue and unexpected weight change.  HENT: Negative for congestion, mouth sores and sinus pressure.   Eyes: Negative for visual disturbance.  Respiratory: Negative for cough and chest tightness.   Gastrointestinal: Negative for nausea and abdominal pain.  Genitourinary: Negative for frequency, difficulty urinating and vaginal pain.  Musculoskeletal: Positive for back pain and gait problem (foot bain is better).  Skin: Negative for pallor and rash.  Neurological: Negative for dizziness, tremors, weakness, numbness and headaches.  Psychiatric/Behavioral: Negative for confusion and sleep disturbance.       Objective:   Physical Exam  Constitutional: She appears well-developed and well-nourished. No distress.  HENT:  Head: Normocephalic.  Right Ear: External  ear normal.  Left Ear: External ear normal.  Nose: Nose normal.  Mouth/Throat: Oropharynx is clear and moist.       Mouth is moist  Eyes: Conjunctivae normal are normal. Pupils are equal, round, and reactive to light. Right eye exhibits no discharge. Left eye exhibits no discharge.  Neck: Normal range of motion. Neck supple. No JVD present. No tracheal deviation present. No thyromegaly present.  Cardiovascular: Normal rate, regular rhythm and normal heart sounds.   Pulmonary/Chest: No stridor. No respiratory distress. She has no wheezes.  Abdominal: Soft. Bowel sounds are normal. She exhibits no distension and no mass. There is no tenderness. There is no rebound and no guarding.  Musculoskeletal: She exhibits tenderness (R ankle is tender w/ROM a little). She exhibits no edema.  Lymphadenopathy:    She has no cervical adenopathy.  Neurological: She displays normal reflexes. A cranial nerve deficit is present. She exhibits normal muscle tone. Coordination abnormal.       Walker Unable to get up off the chair No pron drift B No focal sx's Alert, cooperative  Skin: No rash noted. No erythema.  Psychiatric: She has a normal mood and affect. Her behavior is normal.   Lab Results  Component Value Date   WBC 8.4 11/17/2011   HGB 12.9 11/17/2011   HCT 39.8 11/17/2011   PLT 224.0 11/17/2011   GLUCOSE 129* 12/08/2011   CHOL 179 12/08/2011   TRIG 193.0* 12/08/2011   HDL 47.10 12/08/2011   LDLDIRECT 123.1 06/01/2009   LDLCALC 93 12/08/2011   ALT 10 12/08/2011   AST 27 12/08/2011   NA 138 12/08/2011  K 4.2 12/08/2011   CL 104 12/08/2011   CREATININE 1.9* 12/08/2011   BUN 29* 12/08/2011   CO2 26 12/08/2011   TSH 2.674 06/25/2011   INR 1.29 06/23/2011   HGBA1C 5.7* 07/01/2011   MICROALBUR 1.3 08/17/2006       A complex case   Assessment & Plan:

## 2012-01-23 NOTE — Assessment & Plan Note (Signed)
BMET 

## 2012-01-26 LAB — SEDIMENTATION RATE: Sed Rate: 42 mm/hr — ABNORMAL HIGH (ref 0–22)

## 2012-01-27 ENCOUNTER — Telehealth: Payer: Self-pay | Admitting: Internal Medicine

## 2012-01-27 NOTE — Telephone Encounter (Signed)
Jean [name], please, inform patient that she did have a UTI. Is she better? THx

## 2012-01-28 ENCOUNTER — Other Ambulatory Visit: Payer: Medicare Other

## 2012-01-28 NOTE — Telephone Encounter (Signed)
Pt's spouse called to inform MD that pt is feeling better.

## 2012-01-28 NOTE — Telephone Encounter (Signed)
Left detailed mess re: below.  

## 2012-02-02 ENCOUNTER — Other Ambulatory Visit: Payer: Self-pay | Admitting: Internal Medicine

## 2012-02-05 ENCOUNTER — Ambulatory Visit (INDEPENDENT_AMBULATORY_CARE_PROVIDER_SITE_OTHER)
Admission: RE | Admit: 2012-02-05 | Discharge: 2012-02-05 | Disposition: A | Discharge status: Home / Self Care | Payer: Medicare Other | Source: Ambulatory Visit | Attending: Internal Medicine | Admitting: Internal Medicine

## 2012-02-05 DIAGNOSIS — M545 Low back pain: Secondary | ICD-10-CM

## 2012-02-05 DIAGNOSIS — F29 Unspecified psychosis not due to a substance or known physiological condition: Secondary | ICD-10-CM

## 2012-02-16 ENCOUNTER — Encounter: Payer: Self-pay | Admitting: Internal Medicine

## 2012-02-16 ENCOUNTER — Ambulatory Visit (INDEPENDENT_AMBULATORY_CARE_PROVIDER_SITE_OTHER): Payer: Medicare Other | Admitting: Internal Medicine

## 2012-02-16 VITALS — BP 122/80 | HR 80 | Temp 98.5°F | Resp 16 | Wt 153.0 lb

## 2012-02-16 DIAGNOSIS — I2510 Atherosclerotic heart disease of native coronary artery without angina pectoris: Secondary | ICD-10-CM

## 2012-02-16 DIAGNOSIS — Z23 Encounter for immunization: Secondary | ICD-10-CM

## 2012-02-16 DIAGNOSIS — I10 Essential (primary) hypertension: Secondary | ICD-10-CM

## 2012-02-16 DIAGNOSIS — R413 Other amnesia: Secondary | ICD-10-CM

## 2012-02-16 DIAGNOSIS — M545 Low back pain: Secondary | ICD-10-CM

## 2012-02-16 DIAGNOSIS — E559 Vitamin D deficiency, unspecified: Secondary | ICD-10-CM

## 2012-02-16 DIAGNOSIS — N259 Disorder resulting from impaired renal tubular function, unspecified: Secondary | ICD-10-CM

## 2012-02-16 DIAGNOSIS — E785 Hyperlipidemia, unspecified: Secondary | ICD-10-CM

## 2012-02-16 MED ORDER — DONEPEZIL HCL 10 MG PO TABS: 10.0000 mg | ORAL_TABLET | Freq: Every day | ORAL | Status: DC

## 2012-02-16 NOTE — Assessment & Plan Note (Signed)
Continue with current prescription therapy as reflected on the Med list.  

## 2012-02-16 NOTE — Progress Notes (Signed)
Subjective:    Patient ID: Jean [name], female    DOB: [birth year], 78 y.o.   MRN: [id number]    Fall The accident occurred 2 days ago. The fall occurred while walking and while swimming/diving. She landed on carpet. The pain is mild. Pertinent negatives include no abdominal pain, fever, headaches, nausea or numbness.  Back Pain This is a chronic problem. The problem has been gradually improving since onset. Pertinent negatives include no abdominal pain, fever, headaches, numbness or weakness.   F/u on LBP - s/p surgery; CVA complicated her recovery... C/o falling - tripping over things, no LOC... The patient presents for a follow-up of  chronic hypertension, chronic dyslipidemia, CAD controlled with medicines  C/o memory issues - worse  Wt Readings from Last 3 Encounters:  02/16/12 153 lb (69.4 kg)  01/23/12 154 lb (69.854 kg)  12/10/11 154 lb (69.854 kg)   BP Readings from Last 3 Encounters:  02/16/12 122/80  01/23/12 180/90  12/10/11 160/80       Review of Systems  Constitutional: Negative for fever, chills, activity change, appetite change, fatigue and unexpected weight change.  HENT: Negative for congestion, mouth sores and sinus pressure.   Eyes: Negative for visual disturbance.  Respiratory: Negative for cough and chest tightness.   Gastrointestinal: Negative for nausea and abdominal pain.  Genitourinary: Negative for frequency, difficulty urinating and vaginal pain.  Musculoskeletal: Positive for back pain and gait problem (foot bain is better).  Skin: Negative for pallor and rash.  Neurological: Negative for dizziness, tremors, weakness, numbness and headaches.  Psychiatric/Behavioral: Negative for confusion and sleep disturbance.       Objective:   Physical Exam  Constitutional: She appears well-developed and well-nourished. No distress.  HENT:  Head: Normocephalic.  Right Ear: External ear normal.  Left Ear: External ear normal.  Nose: Nose normal.   Mouth/Throat: Oropharynx is clear and moist.       Mouth is moist  Eyes: Conjunctivae normal are normal. Pupils are equal, round, and reactive to light. Right eye exhibits no discharge. Left eye exhibits no discharge.  Neck: Normal range of motion. Neck supple. No JVD present. No tracheal deviation present. No thyromegaly present.  Cardiovascular: Normal rate, regular rhythm and normal heart sounds.   Pulmonary/Chest: No stridor. No respiratory distress. She has no wheezes.  Abdominal: Soft. Bowel sounds are normal. She exhibits no distension and no mass. There is no tenderness. There is no rebound and no guarding.  Musculoskeletal: She exhibits tenderness (R ankle is tender w/ROM a little). She exhibits no edema.  Lymphadenopathy:    She has no cervical adenopathy.  Neurological: She displays normal reflexes. A cranial nerve deficit is present. She exhibits normal muscle tone. Coordination abnormal.       Walker Unable to get up off the chair No pron drift B No focal sx's Alert, cooperative  Skin: No rash noted. No erythema.  Psychiatric: She has a normal mood and affect. Her behavior is normal.   Lab Results  Component Value Date   WBC 6.3 01/23/2012   HGB 13.1 01/23/2012   HCT 39.8 01/23/2012   PLT 202.0 01/23/2012   GLUCOSE 112* 01/23/2012   CHOL 179 12/08/2011   TRIG 193.0* 12/08/2011   HDL 47.10 12/08/2011   LDLDIRECT 123.1 06/01/2009   LDLCALC 93 12/08/2011   ALT 19 01/23/2012   AST 32 01/23/2012   NA 139 01/23/2012   K 4.9 01/23/2012   CL 104 01/23/2012   CREATININE 1.6* 01/23/2012  BUN 28* 01/23/2012   CO2 27 01/23/2012   TSH 2.674 06/25/2011   INR 1.29 06/23/2011   HGBA1C 5.7* 07/01/2011   MICROALBUR 1.3 08/17/2006         Assessment & Plan:

## 2012-02-16 NOTE — Assessment & Plan Note (Signed)
Continue with current prescription therapy as reflected on the Med list. Denies back pain lately

## 2012-02-16 NOTE — Assessment & Plan Note (Addendum)
A little better - discussed Start Aricep

## 2012-03-08 ENCOUNTER — Telehealth: Payer: Self-pay | Admitting: Internal Medicine

## 2012-03-08 MED ORDER — RIVASTIGMINE 4.6 MG/24HR TD PT24: 1.0000 | MEDICATED_PATCH | Freq: Every day | TRANSDERMAL | Status: DC

## 2012-03-08 MED ORDER — RIVASTIGMINE 9.5 MG/24HR TD PT24: 1.0000 | MEDICATED_PATCH | Freq: Every day | TRANSDERMAL | Status: DC

## 2012-03-08 NOTE — Telephone Encounter (Signed)
Pt's spouse (Mr. Achey) came in and stated that aricept was given to pt last ov and this med is making pt sick. Mr. Siedlecki stop giving this med to the pt and he was wondering what can he do about this?Please call pt' spouse ASAP, they are concern.

## 2012-03-08 NOTE — Telephone Encounter (Signed)
D/c Aricept Start Exelon patch 4.6 mg daily x 1 month, then 9.5 mg daily - get a free supply x 60 d Pick up or mail  Thx

## 2012-03-09 NOTE — Telephone Encounter (Signed)
Pt's husband informed.

## 2012-03-24 ENCOUNTER — Telehealth: Payer: Self-pay | Admitting: Internal Medicine

## 2012-03-24 NOTE — Telephone Encounter (Signed)
Patient Information:  Caller Name: Jerilynn Som  Phone: [phone]  Patient: Jean [name]  Gender: Female  DOB: 19-May-1928  Age: 79 Years  PCP: N/A  Office Follow Up:  Does the office need to follow up with this patient?: No  Instructions For The Office: N/A  RN Note:  Husband agrees to call 911 for eval of symptoms at [facility] per disposition  Symptoms  Reason For Call & Symptoms: she is weak and fell going to [facility] this am, speech slurred since she woke, husband fears a "kidney infection" and asking for a refill on antibiotic,  Reviewed Health History In EMR: Yes  Reviewed Medications In EMR: Yes  Reviewed Allergies In EMR: Yes  Reviewed Surgeries / Procedures: Yes  Date of Onset of Symptoms: 03/24/2012  Guideline(s) Used:  Neurologic Deficit  Disposition Per Guideline:   Call EMS 911 Now  Reason For Disposition Reached:   Difficult to awaken or acting confused (e.g., disoriented, slurred speech)  Advice Given:  N/A

## 2012-03-25 MED ORDER — CIPROFLOXACIN HCL 250 MG PO TABS: 250.0000 mg | ORAL_TABLET | Freq: Two times a day (BID) | ORAL | Status: DC

## 2012-03-25 NOTE — Telephone Encounter (Signed)
Pt is at home and has UTI. Husband is requesting Rf on abx. She is too weak to come in. Please advise.

## 2012-03-25 NOTE — Telephone Encounter (Signed)
Noted. Agree Thx! 

## 2012-03-25 NOTE — Telephone Encounter (Signed)
Cipro Rx was sent to [facility] - start Cipro To ER if not well Thx

## 2012-03-26 NOTE — Telephone Encounter (Signed)
Tried to call pt/spouse to inform of below. No answer/machine.

## 2012-03-27 ENCOUNTER — Other Ambulatory Visit: Payer: Self-pay | Admitting: Internal Medicine

## 2012-04-19 ENCOUNTER — Other Ambulatory Visit: Payer: Self-pay | Admitting: Internal Medicine

## 2012-04-19 NOTE — Telephone Encounter (Signed)
Pt spouse came in to req refill for Meclizine HCL 25mg  to be send to [facility], pt changed insurance and drug store. Please call pt once its done.

## 2012-04-20 ENCOUNTER — Other Ambulatory Visit: Payer: Self-pay | Admitting: *Deleted

## 2012-04-20 MED ORDER — MECLIZINE HCL 25 MG PO TABS: 25.0000 mg | ORAL_TABLET | Freq: Three times a day (TID) | ORAL | Status: DC | PRN

## 2012-04-20 NOTE — Telephone Encounter (Signed)
Done. Pt's spouse informed.

## 2012-04-22 ENCOUNTER — Other Ambulatory Visit: Payer: Self-pay | Admitting: Internal Medicine

## 2012-04-28 ENCOUNTER — Encounter (HOSPITAL_COMMUNITY): Payer: Self-pay | Admitting: Emergency Medicine

## 2012-04-28 ENCOUNTER — Emergency Department (HOSPITAL_COMMUNITY): Payer: Medicare PPO

## 2012-04-28 ENCOUNTER — Encounter (HOSPITAL_COMMUNITY)
Admission: EM | Disposition: A | Discharge status: Home / Self Care | Payer: Self-pay | Source: Home / Self Care | Attending: Emergency Medicine

## 2012-04-28 ENCOUNTER — Emergency Department (HOSPITAL_COMMUNITY)
Admission: EM | Admit: 2012-04-28 | Discharge: 2012-04-28 | Disposition: A | Discharge status: Home / Self Care | Payer: Medicare PPO | Attending: Emergency Medicine | Admitting: Emergency Medicine

## 2012-04-28 DIAGNOSIS — I2510 Atherosclerotic heart disease of native coronary artery without angina pectoris: Secondary | ICD-10-CM | POA: Insufficient documentation

## 2012-04-28 DIAGNOSIS — Z9861 Coronary angioplasty status: Secondary | ICD-10-CM | POA: Insufficient documentation

## 2012-04-28 DIAGNOSIS — I129 Hypertensive chronic kidney disease with stage 1 through stage 4 chronic kidney disease, or unspecified chronic kidney disease: Secondary | ICD-10-CM | POA: Insufficient documentation

## 2012-04-28 DIAGNOSIS — N189 Chronic kidney disease, unspecified: Secondary | ICD-10-CM | POA: Insufficient documentation

## 2012-04-28 DIAGNOSIS — E039 Hypothyroidism, unspecified: Secondary | ICD-10-CM | POA: Insufficient documentation

## 2012-04-28 DIAGNOSIS — I252 Old myocardial infarction: Secondary | ICD-10-CM | POA: Insufficient documentation

## 2012-04-28 DIAGNOSIS — R1310 Dysphagia, unspecified: Secondary | ICD-10-CM | POA: Insufficient documentation

## 2012-04-28 DIAGNOSIS — Z8739 Personal history of other diseases of the musculoskeletal system and connective tissue: Secondary | ICD-10-CM | POA: Insufficient documentation

## 2012-04-28 DIAGNOSIS — Z8679 Personal history of other diseases of the circulatory system: Secondary | ICD-10-CM | POA: Insufficient documentation

## 2012-04-28 DIAGNOSIS — Z79899 Other long term (current) drug therapy: Secondary | ICD-10-CM | POA: Insufficient documentation

## 2012-04-28 DIAGNOSIS — D649 Anemia, unspecified: Secondary | ICD-10-CM | POA: Insufficient documentation

## 2012-04-28 DIAGNOSIS — Z7982 Long term (current) use of aspirin: Secondary | ICD-10-CM | POA: Insufficient documentation

## 2012-04-28 DIAGNOSIS — E785 Hyperlipidemia, unspecified: Secondary | ICD-10-CM | POA: Insufficient documentation

## 2012-04-28 DIAGNOSIS — R1319 Other dysphagia: Secondary | ICD-10-CM

## 2012-04-28 DIAGNOSIS — I509 Heart failure, unspecified: Secondary | ICD-10-CM | POA: Insufficient documentation

## 2012-04-28 DIAGNOSIS — Z8673 Personal history of transient ischemic attack (TIA), and cerebral infarction without residual deficits: Secondary | ICD-10-CM | POA: Insufficient documentation

## 2012-04-28 DIAGNOSIS — Z8639 Personal history of other endocrine, nutritional and metabolic disease: Secondary | ICD-10-CM | POA: Insufficient documentation

## 2012-04-28 HISTORY — PX: ESOPHAGOGASTRODUODENOSCOPY: SHX5428

## 2012-04-28 LAB — GLUCOSE, CAPILLARY: Glucose-Capillary: 96 mg/dL (ref 70–99)

## 2012-04-28 SURGERY — EGD (ESOPHAGOGASTRODUODENOSCOPY)
Anesthesia: Moderate Sedation

## 2012-04-28 MED ORDER — SODIUM CHLORIDE 0.9 % IV SOLN: INTRAVENOUS | Status: DC

## 2012-04-28 MED ORDER — FENTANYL CITRATE 0.05 MG/ML IJ SOLN
INTRAMUSCULAR | Status: DC | PRN
  Administered 2012-04-28: 25 ug via INTRAVENOUS

## 2012-04-28 MED ORDER — GLUCAGON HCL (RDNA) 1 MG IJ SOLR
1.0000 mg | Freq: Once | INTRAMUSCULAR | Status: AC
  Administered 2012-04-28: 1 mg via INTRAVENOUS
  Filled 2012-04-28: qty 1

## 2012-04-28 MED ORDER — MECLIZINE HCL 25 MG PO TABS: 25.0000 mg | ORAL_TABLET | Freq: Three times a day (TID) | ORAL | Status: DC | PRN

## 2012-04-28 MED ORDER — ERGOCALCIFEROL 1.25 MG (50000 UT) PO CAPS: 50000.0000 [IU] | ORAL_CAPSULE | ORAL | Status: DC

## 2012-04-28 MED ORDER — OMEPRAZOLE 20 MG PO CPDR: 20.0000 mg | DELAYED_RELEASE_CAPSULE | Freq: Every day | ORAL | Status: DC

## 2012-04-28 MED ORDER — MIDAZOLAM HCL 10 MG/2ML IJ SOLN
INTRAMUSCULAR | Status: DC | PRN
  Administered 2012-04-28: 1 mg via INTRAVENOUS

## 2012-04-28 MED ORDER — BUTAMBEN-TETRACAINE-BENZOCAINE 2-2-14 % EX AERO
INHALATION_SPRAY | CUTANEOUS | Status: DC | PRN
  Administered 2012-04-28: 2 via TOPICAL

## 2012-04-28 NOTE — Consult Note (Signed)
Referring Provider: No ref. provider found Primary Care Physician:  Sonda Primes, MD Primary Gastroenterologist:  Dr. Jarold Motto  Reason for Consultation:  Food bolus  HPI: Jean [name] is a 79 y.o. female with PMH of CAD/CHF, history of CVA, hypothyroidism, and CKD stage 3, who presented to [facility] ED with complaints of feeling like food was stuck in her esophagus.  She was eating chicken and dumplings with her husband at a restaurant when she felt like a bite would not go down.  She tried to drink water and tea, but it came right back up.  Was described as pressure at the area of sternal notch.  In the ED she was given glucagon without any relief.  She has never experienced any episodes similar to this in the past.  Had EGD remotely, in 1997, by Dr. Jarold Motto (see path report but no endo report).  Says that appetite has been good and there has not been any weight loss.  No abdominal pain.  No SOB or coughing.  She is on ASA 325 mg and plavix 75 mg, but has not taken the plavix since Monday because she ran out of the medication.   Past Medical History  Diagnosis Date  . Hyperlipidemia   . Hypothyroid     hypo  . CHF (congestive heart failure)   . Mitral regurgitation   . Tricuspid regurgitation   . Anemia   . Arthritis     Osteoarthtistis  . Elevated glucose   . CVA (cerebral infarction)   . Vitamin D deficiency   . Myocardial infarction   . Chronic kidney failure     stage 3  . CAD (coronary artery disease)     sees Dr. Ricka Burdock last 03/2011  . Hypertension     Dr. Eden Emms cardiologist, Dr. Cheron Schaumann primary doctor    Past Surgical History  Procedure Laterality Date  . Abdominal hysterectomy    . Back surgery    . Coronary stent placement    . Laparotomy  06/23/2011    Procedure: EXPLORATORY LAPAROTOMY;  Surgeon: Barnett Abu, MD;  Location: [facility];  Service: Neurosurgery;  Laterality: Bilateral;    Prior to Admission medications   Medication Sig Start Date End Date  Taking? Authorizing Provider  aspirin 325 MG tablet Take 325 mg by mouth daily.    Yes Historical Provider, MD  cholecalciferol (VITAMIN D) 1000 UNITS tablet Take 1,000 Units by mouth daily.   Yes Georgina Quint Plotnikov, MD  Coenzyme Q10 (CO Q 10 PO) Take 1 tablet by mouth daily.    Yes Historical Provider, MD  ergocalciferol (VITAMIN D2) 50000 UNITS capsule Take 50,000 Units by mouth once a week. 12/09/11  Yes Georgina Quint Plotnikov, MD  levothyroxine (SYNTHROID, LEVOTHROID) 75 MCG tablet Take 75 mcg by mouth daily.   Yes Historical Provider, MD  meclizine (ANTIVERT) 25 MG tablet Take 25 mg by mouth 3 (three) times daily as needed. Vertigo 04/20/12  Yes Tresa Garter, MD  methyldopa (ALDOMET) 250 MG tablet TAKE 1 TABLET TWICE DAILY 03/27/12  Yes Georgina Quint Plotnikov, MD  metoprolol (LOPRESSOR) 100 MG tablet TAKE ONE AND ONE-HALF TABLETS (150 MG TOTAL) TWICE A DAY 04/22/12  Yes Georgina Quint Plotnikov, MD  pravastatin (PRAVACHOL) 20 MG tablet TAKE 1 TABLET TWICE A DAY 02/02/12  Yes Georgina Quint Plotnikov, MD  rivastigmine (EXELON) 9.5 mg/24hr Place 1 patch (9.5 mg total) onto the skin daily. 03/08/12  Yes Tresa Garter, MD  vitamin B-12 (CYANOCOBALAMIN) 100 MCG  tablet Take 100 mcg by mouth daily.   Yes Historical Provider, MD  clopidogrel (PLAVIX) 75 MG tablet TAKE 1 TABLET DAILY 01/20/12   Tresa Garter, MD  nitroGLYCERIN (NITROSTAT) 0.4 MG SL tablet Place 0.4 mg under the tongue every 5 (five) minutes as needed. For chest pain    Historical Provider, MD    No current facility-administered medications for this encounter.   Current Outpatient Prescriptions  Medication Sig Dispense Refill  . aspirin 325 MG tablet Take 325 mg by mouth daily.       . cholecalciferol (VITAMIN D) 1000 UNITS tablet Take 1,000 Units by mouth daily.      . Coenzyme Q10 (CO Q 10 PO) Take 1 tablet by mouth daily.       . ergocalciferol (VITAMIN D2) 50000 UNITS capsule Take 50,000 Units by mouth once a week.      .  levothyroxine (SYNTHROID, LEVOTHROID) 75 MCG tablet Take 75 mcg by mouth daily.      . meclizine (ANTIVERT) 25 MG tablet Take 25 mg by mouth 3 (three) times daily as needed. Vertigo      . methyldopa (ALDOMET) 250 MG tablet TAKE 1 TABLET TWICE DAILY  180 tablet  1  . metoprolol (LOPRESSOR) 100 MG tablet TAKE ONE AND ONE-HALF TABLETS (150 MG TOTAL) TWICE A DAY  270 tablet  1  . pravastatin (PRAVACHOL) 20 MG tablet TAKE 1 TABLET TWICE A DAY  180 tablet  2  . rivastigmine (EXELON) 9.5 mg/24hr Place 1 patch (9.5 mg total) onto the skin daily.  30 patch  12  . vitamin B-12 (CYANOCOBALAMIN) 100 MCG tablet Take 100 mcg by mouth daily.      . clopidogrel (PLAVIX) 75 MG tablet TAKE 1 TABLET DAILY  90 tablet  1  . nitroGLYCERIN (NITROSTAT) 0.4 MG SL tablet Place 0.4 mg under the tongue every 5 (five) minutes as needed. For chest pain        Allergies as of 04/28/2012 - Review Complete 02/16/2012  Allergen Reaction Noted  . Ace inhibitors  06/20/2011  . Aricept (donepezil hcl)  03/08/2012  . Ezetimibe-simvastatin  12/23/2006  . Rosuvastatin  06/08/2007  . Penicillins Rash 12/23/2006    Family History  Problem Relation Age of Onset  . Aneurysm Mother   . Heart disease Mother   . Heart attack Father   . Hypertension Father   . Anesthesia problems Neg Hx     History   Social History  . Marital Status: Married    Spouse Name: N/A    Number of Children: N/A  . Years of Education: N/A   Occupational History  . Not on file.   Social History Main Topics  . Smoking status: Never Smoker   . Smokeless tobacco: Not on file  . Alcohol Use: No  . Drug Use: No  . Sexually Active: Not on file   Other Topics Concern  . Not on file   Social History Narrative  . No narrative on file    Review of Systems: Ten point ROS is O/W negative except as mentioned in HPI.  Physical Exam: Vital signs in last 24 hours: Temp:  [97.8 F (36.6 C)-98.4 F (36.9 C)] 97.8 F (36.6 C) (02/26 1300) Pulse  Rate:  [83] 83 (02/26 1254) Resp:  [18] 18 (02/26 1254) BP: (183)/(88) 183/88 mmHg (02/26 1254) SpO2:  [100 %] 100 % (02/26 1254)   General:   Alert, Well-developed, well-nourished, pleasant and cooperative in NAD Head:  Normocephalic and atraumatic.  Has ecchymosis below right eye from previous fall Eyes:  Sclera clear, no icterus.  Conjunctiva pink. Ears:  Normal auditory acuity. Mouth:  No deformity or lesions.   Neck:  Supple; no masses or thyromegaly. Lungs:  Clear throughout to auscultation.  No wheezes, crackles, or rhonchi.  Heart:  Regular rate and rhythm; no murmurs, clicks, rubs,  or gallops. Abdomen:  Soft, nontender, BS active,nonpalp mass or hsm.   Rectal:  Deferred  Msk:  Symmetrical without gross deformities. Pulses:  Normal pulses noted. Extremities:  Without clubbing or edema. Neurologic:  Alert and  oriented x4;  grossly normal neurologically. Skin:  Intact without significant lesions or rashes.. Psych:  Alert and cooperative. Normal mood and affect.  Studies/Results: Dg Chest Port 1 View  04/28/2012  *RADIOLOGY REPORT*  Clinical Data: Chest pain.  PORTABLE CHEST - 1 VIEW  Comparison: PA and lateral chest 08/23/2009 and 07/07/2011.  Findings: There is cardiomegaly but no pulmonary edema.  No pneumothorax or pleural effusion is identified.  IMPRESSION: Cardiomegaly without acute disease.   Original Report Authenticated By: Holley Dexter, M.D.     IMPRESSION:  -Food impaction/bolus -CKD stage 3 -CAD and CHF -History of CVA  PLAN: -EGD later today -Pending the results of EGD, patient can likely be discharged home post procedure with outpatient follow-up.   ZEHR, JESSICA D.  04/28/2012, 2:25 PM  Pager number [phone] I have reviewed the above note, examined the patient and agree with plan of treatment.She is able to swallow her saliva at this point but still feels fullness. She has been off Plavix x 3 days- will EGD and remove food impaction if  necessary.  Willa [facility] Pager # [phone]

## 2012-04-28 NOTE — Op Note (Signed)
[facility] [address] [place], [address]   ENDOSCOPY PROCEDURE REPORT  PATIENT: Jean [name], Jean [name]  MR#: [id number] BIRTHDATE: May 15, 1928 , 84  yrs. old GENDER: Female ENDOSCOPIST: Hart Carwin, MD REFERRED BY:  Janeal Holmes, M.D. PROCEDURE DATE:  04/28/2012 PROCEDURE:  EGD, diagnostic ASA CLASS:     Class III INDICATIONS:  Dysphagia.   Foreign body removal., no prior hx of dysphagia, pt evaluated in ED for food impaction, given Glucagon, Plavix stopped 3 days ago MEDICATIONS: These medications were titrated to patient response per physician's verbal order, Fentanyl 25 mcg IV, and Versed 1mg  IV TOPICAL ANESTHETIC: none  DESCRIPTION OF PROCEDURE: After the risks benefits and alternatives of the procedure were thoroughly explained, informed consent was obtained.  The Pentax Gastroscope [id number] endoscope was introduced through the mouth and advanced to the second portion of the duodenum. Without limitations.  The instrument was slowly withdrawn as the mucosa was fully examined.      ESOPHGUS: THe distal esophageal mucosa appeared macerated from recent food impacyion, but no obstructing  object present, there wre pieces of food scattered in the distal esophagus, there was no stricture present, endoscope passed into stomach without resistance. There is no hiatal hernia. Gastric folds and antrum were normal. Normal pyloric outlet and duodenum.[ Retroflexion was normal.       The scope was then withdrawn from the patient and the procedure completed.  COMPLICATIONS: There were no complications. ENDOSCOPIC IMPRESSION:  1.Distal esophagitis secondary to food impaction 2.Food impaction has passed  prior to EGD 3.No evidence of esophageal stricture, suspect esophageal dismotility  RECOMMENDATIONS:  Careful chewing, upright position follow up Dr Jarold Motto March 18, 8.30 am, [facility] .3rd floor, [phone] Prilosec 20 mg daily Resume all  medications Will need esophagram to assess motility REPEAT EXAM: no  eSigned:  Hart Carwin, MD 04/28/2012 4:35 PM   CC:  PATIENT NAME:  [name], [name] MR#: [id number]

## 2012-04-28 NOTE — ED Provider Notes (Signed)
History     CSN: [id number]  Arrival date & time 04/28/12  1248   First MD Initiated Contact with Patient 04/28/12 1253      Chief Complaint  Patient presents with  . Dysphagia    (Consider location/radiation/quality/duration/timing/severity/associated sxs/prior treatment) Patient is a 79 y.o. female presenting with vomiting. The history is provided by the patient (pt was having lunch and then began to vomit and could not swalow anything).  Emesis Severity:  Mild Timing:  Constant Quality:  Stomach contents Feeding tolerance: nothing. Progression:  Unchanged Associated symptoms: no abdominal pain, no diarrhea and no headaches     Past Medical History  Diagnosis Date  . Hyperlipidemia   . Hypothyroid     hypo  . CHF (congestive heart failure)   . Mitral regurgitation   . Tricuspid regurgitation   . Anemia   . Arthritis     Osteoarthtistis  . Elevated glucose   . CVA (cerebral infarction)   . Vitamin D deficiency   . Myocardial infarction   . Chronic kidney failure     stage 3  . CAD (coronary artery disease)     sees Dr. Ricka Burdock last 03/2011  . Hypertension     Dr. Eden Emms cardiologist, Dr. Cheron Schaumann primary doctor    Past Surgical History  Procedure Laterality Date  . Abdominal hysterectomy    . Back surgery    . Coronary stent placement    . Laparotomy  06/23/2011    Procedure: EXPLORATORY LAPAROTOMY;  Surgeon: Barnett Abu, MD;  Location: [facility];  Service: Neurosurgery;  Laterality: Bilateral;    Family History  Problem Relation Age of Onset  . Aneurysm Mother   . Heart disease Mother   . Heart attack Father   . Hypertension Father   . Anesthesia problems Neg Hx     History  Substance Use Topics  . Smoking status: Never Smoker   . Smokeless tobacco: Not on file  . Alcohol Use: No    OB History   Grav Para Term Preterm Abortions TAB SAB Ect Mult Living                  Review of Systems  Constitutional: Negative for fatigue.   HENT: Negative for congestion, sinus pressure and ear discharge.        Difficulty swallowing  Eyes: Negative for discharge.  Respiratory: Negative for cough.   Cardiovascular: Negative for chest pain.  Gastrointestinal: Positive for vomiting. Negative for abdominal pain and diarrhea.  Genitourinary: Negative for frequency and hematuria.  Musculoskeletal: Negative for back pain.  Skin: Negative for rash.  Neurological: Negative for seizures and headaches.  Psychiatric/Behavioral: Negative for hallucinations.    Allergies  Ace inhibitors; Aricept; Ezetimibe-simvastatin; Rosuvastatin; and Penicillins  Home Medications   Current Outpatient Rx  Name  Route  Sig  Dispense  Refill  . aspirin 325 MG tablet   Oral   Take 325 mg by mouth daily.          . cholecalciferol (VITAMIN D) 1000 UNITS tablet   Oral   Take 1,000 Units by mouth daily.         . Coenzyme Q10 (CO Q 10 PO)   Oral   Take 1 tablet by mouth daily.          . ergocalciferol (VITAMIN D2) 50000 UNITS capsule   Oral   Take 50,000 Units by mouth once a week.         . levothyroxine (SYNTHROID,  LEVOTHROID) 75 MCG tablet   Oral   Take 75 mcg by mouth daily.         . meclizine (ANTIVERT) 25 MG tablet   Oral   Take 25 mg by mouth 3 (three) times daily as needed. Vertigo         . methyldopa (ALDOMET) 250 MG tablet      TAKE 1 TABLET TWICE DAILY   180 tablet   1   . metoprolol (LOPRESSOR) 100 MG tablet      TAKE ONE AND ONE-HALF TABLETS (150 MG TOTAL) TWICE A DAY   270 tablet   1   . pravastatin (PRAVACHOL) 20 MG tablet      TAKE 1 TABLET TWICE A DAY   180 tablet   2   . rivastigmine (EXELON) 9.5 mg/24hr   Transdermal   Place 1 patch (9.5 mg total) onto the skin daily.   30 patch   12   . vitamin B-12 (CYANOCOBALAMIN) 100 MCG tablet   Oral   Take 100 mcg by mouth daily.         . clopidogrel (PLAVIX) 75 MG tablet      TAKE 1 TABLET DAILY   90 tablet   1   . nitroGLYCERIN  (NITROSTAT) 0.4 MG SL tablet   Sublingual   Place 0.4 mg under the tongue every 5 (five) minutes as needed. For chest pain           BP 183/88  Pulse 83  Temp(Src) 97.8 F (36.6 C) (Oral)  Resp 18  SpO2 100%  Physical Exam  Constitutional: She is oriented to person, place, and time. She appears well-developed.  HENT:  Head: Normocephalic and atraumatic.  Eyes: Conjunctivae and EOM are normal. No scleral icterus.  Neck: Neck supple. No thyromegaly present.  Cardiovascular: Normal rate and regular rhythm.  Exam reveals no gallop and no friction rub.   No murmur heard. Pulmonary/Chest: No stridor. She has no wheezes. She has no rales. She exhibits no tenderness.  Abdominal: She exhibits no distension. There is no tenderness. There is no rebound.  Musculoskeletal: Normal range of motion. She exhibits no edema.  Lymphadenopathy:    She has no cervical adenopathy.  Neurological: She is oriented to person, place, and time. Coordination normal.  Skin: No rash noted. No erythema.  Psychiatric: She has a normal mood and affect. Her behavior is normal.    ED Course  Procedures (including critical care time)  Labs Reviewed  GLUCOSE, CAPILLARY   Dg Chest [facility] 1 View  04/28/2012  *RADIOLOGY REPORT*  Clinical Data: Chest pain.  PORTABLE CHEST - 1 VIEW  Comparison: PA and lateral chest 08/23/2009 and 07/07/2011.  Findings: There is cardiomegaly but no pulmonary edema.  No pneumothorax or pleural effusion is identified.  IMPRESSION: Cardiomegaly without acute disease.   Original Report Authenticated By: Holley Dexter, M.D.      No diagnosis found.    MDM  Pt given glucagon but still was not able to swallow water.  Gi to see pt       Benny Lennert, MD 04/28/12 1423

## 2012-04-28 NOTE — ED Notes (Signed)
Pt transported to Endo 

## 2012-04-28 NOTE — ED Notes (Addendum)
GI PA at bedside. 

## 2012-04-28 NOTE — ED Notes (Signed)
Pt states that she was at lunch eating chicken & dumplings and butter beans and she thinks that there are some butter beans that are stuck in her throat.  States that when she tried to drink tea afterward, she just threw it back up.  Denies pain.

## 2012-04-29 ENCOUNTER — Telehealth: Payer: Self-pay | Admitting: Internal Medicine

## 2012-04-29 ENCOUNTER — Encounter (HOSPITAL_COMMUNITY): Payer: Self-pay | Admitting: Internal Medicine

## 2012-04-29 MED ORDER — LEVOTHYROXINE SODIUM 75 MCG PO TABS: 75.0000 ug | ORAL_TABLET | Freq: Every day | ORAL | Status: DC

## 2012-04-29 MED ORDER — CLOPIDOGREL BISULFATE 75 MG PO TABS: 75.0000 mg | ORAL_TABLET | Freq: Every day | ORAL | Status: DC

## 2012-04-29 MED ORDER — METHYLDOPA 250 MG PO TABS: 250.0000 mg | ORAL_TABLET | Freq: Two times a day (BID) | ORAL | Status: DC

## 2012-04-29 NOTE — Telephone Encounter (Signed)
The [facility] are switching to [employer].  Korah needs a refill on Plavix, Levothroxine and Methyldpa.

## 2012-04-29 NOTE — Telephone Encounter (Signed)
Done

## 2012-05-10 ENCOUNTER — Telehealth: Payer: Self-pay | Admitting: Internal Medicine

## 2012-05-10 NOTE — Telephone Encounter (Signed)
Caller: Bridget/; Phone: [phone]; Reason for Call: Spoke to Pharmacist [place].  Patient is changing from mail order service to [employer].  Mail order pharmacies will not transfer prescription.  Pharmacist is requesting a new script for the patient's Pravastatin 20 mg BID.  Fax # is [phone] with phone [phone].

## 2012-05-12 ENCOUNTER — Encounter: Payer: Self-pay | Admitting: *Deleted

## 2012-05-13 MED ORDER — PRAVASTATIN SODIUM 20 MG PO TABS: 20.0000 mg | ORAL_TABLET | Freq: Two times a day (BID) | ORAL | Status: DC

## 2012-05-13 NOTE — Telephone Encounter (Signed)
Done

## 2012-05-17 ENCOUNTER — Other Ambulatory Visit (INDEPENDENT_AMBULATORY_CARE_PROVIDER_SITE_OTHER): Payer: Medicare Other

## 2012-05-17 DIAGNOSIS — I2510 Atherosclerotic heart disease of native coronary artery without angina pectoris: Secondary | ICD-10-CM

## 2012-05-17 DIAGNOSIS — N259 Disorder resulting from impaired renal tubular function, unspecified: Secondary | ICD-10-CM

## 2012-05-17 DIAGNOSIS — Z23 Encounter for immunization: Secondary | ICD-10-CM

## 2012-05-17 DIAGNOSIS — R413 Other amnesia: Secondary | ICD-10-CM

## 2012-05-17 DIAGNOSIS — M545 Low back pain: Secondary | ICD-10-CM

## 2012-05-17 DIAGNOSIS — I10 Essential (primary) hypertension: Secondary | ICD-10-CM

## 2012-05-17 DIAGNOSIS — E785 Hyperlipidemia, unspecified: Secondary | ICD-10-CM

## 2012-05-17 LAB — BASIC METABOLIC PANEL
CO2: 27 mEq/L (ref 19–32)
Chloride: 105 mEq/L (ref 96–112)
Creatinine, Ser: 1.5 mg/dL — ABNORMAL HIGH (ref 0.4–1.2)
Glucose, Bld: 95 mg/dL (ref 70–99)
Sodium: 138 mEq/L (ref 135–145)

## 2012-05-17 LAB — LDL CHOLESTEROL, DIRECT: Direct LDL: 114.7 mg/dL

## 2012-05-17 LAB — HEPATIC FUNCTION PANEL
ALT: 15 U/L (ref 0–35)
Alkaline Phosphatase: 94 U/L (ref 39–117)
Bilirubin, Direct: 0.1 mg/dL (ref 0.0–0.3)
Total Protein: 7.2 g/dL (ref 6.0–8.3)

## 2012-05-18 ENCOUNTER — Encounter: Payer: Self-pay | Admitting: Gastroenterology

## 2012-05-18 ENCOUNTER — Encounter: Payer: Self-pay | Admitting: Internal Medicine

## 2012-05-18 ENCOUNTER — Ambulatory Visit (INDEPENDENT_AMBULATORY_CARE_PROVIDER_SITE_OTHER): Payer: Medicare PPO | Admitting: Internal Medicine

## 2012-05-18 ENCOUNTER — Ambulatory Visit (INDEPENDENT_AMBULATORY_CARE_PROVIDER_SITE_OTHER): Payer: Medicare PPO | Admitting: Gastroenterology

## 2012-05-18 VITALS — BP 170/108 | HR 80 | Temp 97.8°F | Resp 16 | Wt 157.0 lb

## 2012-05-18 VITALS — BP 158/70 | HR 58 | Ht 67.0 in | Wt 159.0 lb

## 2012-05-18 DIAGNOSIS — E039 Hypothyroidism, unspecified: Secondary | ICD-10-CM

## 2012-05-18 DIAGNOSIS — M545 Low back pain: Secondary | ICD-10-CM

## 2012-05-18 DIAGNOSIS — R1319 Other dysphagia: Secondary | ICD-10-CM

## 2012-05-18 DIAGNOSIS — Z8711 Personal history of peptic ulcer disease: Secondary | ICD-10-CM

## 2012-05-18 DIAGNOSIS — F29 Unspecified psychosis not due to a substance or known physiological condition: Secondary | ICD-10-CM

## 2012-05-18 DIAGNOSIS — R270 Ataxia, unspecified: Secondary | ICD-10-CM

## 2012-05-18 DIAGNOSIS — I2510 Atherosclerotic heart disease of native coronary artery without angina pectoris: Secondary | ICD-10-CM

## 2012-05-18 DIAGNOSIS — I10 Essential (primary) hypertension: Secondary | ICD-10-CM

## 2012-05-18 DIAGNOSIS — R413 Other amnesia: Secondary | ICD-10-CM

## 2012-05-18 DIAGNOSIS — F0390 Unspecified dementia without behavioral disturbance: Secondary | ICD-10-CM

## 2012-05-18 DIAGNOSIS — K5900 Constipation, unspecified: Secondary | ICD-10-CM

## 2012-05-18 DIAGNOSIS — R1310 Dysphagia, unspecified: Secondary | ICD-10-CM

## 2012-05-18 DIAGNOSIS — R279 Unspecified lack of coordination: Secondary | ICD-10-CM

## 2012-05-18 DIAGNOSIS — R410 Disorientation, unspecified: Secondary | ICD-10-CM

## 2012-05-18 DIAGNOSIS — N179 Acute kidney failure, unspecified: Secondary | ICD-10-CM

## 2012-05-18 MED ORDER — RIVASTIGMINE 9.5 MG/24HR TD PT24: 1.0000 | MEDICATED_PATCH | Freq: Every day | TRANSDERMAL | Status: DC

## 2012-05-18 MED ORDER — METOPROLOL TARTRATE 100 MG PO TABS: 150.0000 mg | ORAL_TABLET | Freq: Two times a day (BID) | ORAL | Status: DC

## 2012-05-18 NOTE — Assessment & Plan Note (Signed)
Continue with current prescription therapy as reflected on the Med list.  

## 2012-05-18 NOTE — Patient Instructions (Addendum)
Please use Miralax at bedtime as directed. Can purchase over the counter.  Please stay on your Omeprazole.   _____________________________________________________________________________________________________________  Jean [name] have been scheduled for a Barium Esophogram at [facility] (1st floor of the hospital) on 05-20-2012 at 10:30 am. Please arrive 15 minutes prior to your appointment for registration. Make certain not to have anything to eat or drink 6 hours prior to your test. If you need to reschedule for any reason, please contact radiology at [phone] to do so. __________________________________________________________________ A barium swallow is an examination that concentrates on views of the esophagus. This tends to be a double contrast exam (barium and two liquids which, when combined, create a gas to distend the wall of the oesophagus) or single contrast (non-ionic iodine based). The study is usually tailored to your symptoms so a good history is essential. Attention is paid during the study to the form, structure and configuration of the esophagus, looking for functional disorders (such as aspiration, dysphagia, achalasia, motility and reflux) EXAMINATION You may be asked to change into a gown, depending on the type of swallow being performed. A radiologist and radiographer will perform the procedure. The radiologist will advise you of the type of contrast selected for your procedure and direct you during the exam. You will be asked to stand, sit or lie in several different positions and to hold a small amount of fluid in your mouth before being asked to swallow while the imaging is performed .In some instances you may be asked to swallow barium coated marshmallows to assess the motility of a solid food bolus. The exam can be recorded as a digital or video fluoroscopy procedure. POST PROCEDURE It will take 1-2 days for the barium to pass through your system. To facilitate this,  it is important, unless otherwise directed, to increase your fluids for the next 24-48hrs and to resume your normal diet.  This test typically takes about 30 minutes to perform. __________________________________________________________________________________

## 2012-05-18 NOTE — Progress Notes (Signed)
Subjective:      Back Pain This is a chronic problem. The problem has been gradually improving since onset. The pain is at a severity of 6/10. The pain is moderate. Pertinent negatives include no weakness. She has tried nothing for the symptoms. The treatment provided mild relief.   F/u on LBP - s/p surgery; CVA complicated her recovery... C/o falling - tripping over things, no LOC... She is finished w/PT at home due to poor compliance.  The patient presents for a follow-up of  chronic hypertension, chronic dyslipidemia, CAD controlled with medicines  C/o memory issues - worse. Confused. She ran out of the [employer]  Wt Readings from Last 3 Encounters:  05/18/12 157 lb (71.215 kg)  05/18/12 159 lb (72.122 kg)  04/28/12 150 lb (68.04 kg)   BP Readings from Last 3 Encounters:  05/18/12 170/108  05/18/12 158/70  04/28/12 137/73       Review of Systems  Constitutional: Negative for chills, activity change, appetite change, fatigue and unexpected weight change.  HENT: Negative for congestion, mouth sores and sinus pressure.   Eyes: Negative for visual disturbance.  Respiratory: Negative for cough and chest tightness.   Genitourinary: Negative for frequency, difficulty urinating and vaginal pain.  Musculoskeletal: Positive for back pain and gait problem (foot bain is better).  Skin: Negative for pallor and rash.  Neurological: Negative for dizziness, tremors and weakness.  Psychiatric/Behavioral: Negative for confusion and sleep disturbance.       Objective:   Physical Exam  Constitutional: She appears well-developed and well-nourished. No distress.  HENT:  Head: Normocephalic.  Right Ear: External ear normal.  Left Ear: External ear normal.  Nose: Nose normal.  Mouth/Throat: Oropharynx is clear and moist.  Mouth is moist  Eyes: Conjunctivae are normal. Pupils are equal, round, and reactive to light. Right eye exhibits no discharge. Left eye exhibits no discharge.  Neck:  Normal range of motion. Neck supple. No JVD present. No tracheal deviation present. No thyromegaly present.  Cardiovascular: Normal rate, regular rhythm and normal heart sounds.   Pulmonary/Chest: No stridor. No respiratory distress. She has no wheezes.  Abdominal: Soft. Bowel sounds are normal. She exhibits no distension and no mass. There is no tenderness. There is no rebound and no guarding.  Musculoskeletal: She exhibits tenderness (R ankle is tender w/ROM a little). She exhibits no edema.  Lymphadenopathy:    She has no cervical adenopathy.  Neurological: She displays normal reflexes. A cranial nerve deficit is present. She exhibits normal muscle tone. Coordination abnormal.  Walker Unable to get up off the chair No pron drift B No focal sx's Alert, cooperative  Skin: No rash noted. No erythema.  Psychiatric: She has a normal mood and affect. Her behavior is normal.   Lab Results  Component Value Date   WBC 6.3 01/23/2012   HGB 13.1 01/23/2012   HCT 39.8 01/23/2012   PLT 202.0 01/23/2012   GLUCOSE 95 05/17/2012   CHOL 203* 05/17/2012   TRIG 226.0* 05/17/2012   HDL 43.40 05/17/2012   LDLDIRECT 114.7 05/17/2012   LDLCALC 93 12/08/2011   ALT 15 05/17/2012   AST 22 05/17/2012   NA 138 05/17/2012   K 4.4 05/17/2012   CL 105 05/17/2012   CREATININE 1.5* 05/17/2012   BUN 28* 05/17/2012   CO2 27 05/17/2012   TSH 2.674 06/25/2011   INR 1.29 06/23/2011   HGBA1C 5.7* 07/01/2011   MICROALBUR 1.3 08/17/2006         Assessment &  Plan:

## 2012-05-18 NOTE — Assessment & Plan Note (Signed)
Jean [name]

## 2012-05-18 NOTE — Assessment & Plan Note (Signed)
Labs q 3-4 mo

## 2012-05-18 NOTE — Progress Notes (Signed)
History of Present Illness:  This is a 79 year old Caucasian female with mild dementia status post CVA one year ago at the time of spinal surgery.  She presented to the emergency room with acute meat impaction of February 26 of this year.  Endoscopy by Dr.Brodie and was unremarkable for any pathology.  She was placed on Prilosec and told to followup with myself.  I saw her many years ago for a slow healing gastric ulceration.  This was followed to healing endoscopically.  She also had previous colonoscopies which were unremarkable.  She is a very poor historian,has dementia, annd all of her history is given by her husband.  She apparently has no specific neuromuscular problems, difficulty swallowing liquids, chronic reflux symptoms history of Raynaud's phenomenon or other neuromuscular illnesses.  She does suffer from mild chronic constipation, but no rectal bleeding or melena.  There is been no anorexia, weight loss, or systemic complaints.  She is on multiple medications including Plavix 75 mg a day, multiple cardiac medications, and multiple vitamin preparations and aspirin 325 mg a day.  I have reviewed this patient's present history, medical and surgical past history, allergies and medications.     ROS:   All systems were reviewed and are negative unless otherwise stated in the HPI.    Physical Exam: Very flat affect with little communication.  Blood pressure 150/70, pulse 58 regular, and weight 159 pounds.  BMI 24.9. General well developed well nourished patient in no acute distress, appearing their stated age... lamination oropharyngeal areas unremarkable. Eyes PERRLA, no icterus, fundoscopic exam per opthamologist Skin no lesions noted Neck supple, no adenopathy, no thyroid enlargement, no tenderness Chest clear to percussion and auscultation Heart no significant murmurs, gallops or rubs noted Abdomen no hepatosplenomegaly masses or tenderness, BS normal.  Loops of impacted bowel with stool  felt in the lower abdomen.  These are freely movable and nontender. Extremities no acute joint lesions, edema, phlebitis or evidence of cellulitis. Neurologic patient oriented x 3, cranial nerves intact, no focal neurologic deficits noted. Psychological mental status normal and normal affect.  Patient oriented x3 but has obvious severe dementia.  There no gross focal neurologic deficits noted however.  Assessment and plan: Possible neurogenic dysphagia versus chronic GERD and occult peptic stricture of the esophagus.  I will continue daily PPI therapy and check barium swallow.  She may need repeat endoscopy and further dilation versus esophageal high-resolution manometry.  I've advised the husband to place her on lightly MiraLax because of her constipation.  Further followup will depend on her radiographic results.  She is to continue her followup care with primary care as scheduled.  Encounter Diagnosis  Name Primary?  Marland Kitchen Dysphagia, unspecified Yes

## 2012-05-18 NOTE — Assessment & Plan Note (Signed)
Re-start Exelon 

## 2012-05-18 NOTE — Assessment & Plan Note (Signed)
Ba swallow test

## 2012-05-18 NOTE — Assessment & Plan Note (Signed)
Re-start Exelon

## 2012-05-18 NOTE — Assessment & Plan Note (Signed)
Better Not on meds now

## 2012-05-20 ENCOUNTER — Ambulatory Visit (HOSPITAL_COMMUNITY)
Admission: RE | Admit: 2012-05-20 | Discharge: 2012-05-20 | Disposition: A | Discharge status: Home / Self Care | Payer: Medicare PPO | Source: Ambulatory Visit | Attending: Gastroenterology | Admitting: Gastroenterology

## 2012-05-20 DIAGNOSIS — K228 Other specified diseases of esophagus: Secondary | ICD-10-CM | POA: Insufficient documentation

## 2012-05-20 DIAGNOSIS — K2289 Other specified disease of esophagus: Secondary | ICD-10-CM | POA: Insufficient documentation

## 2012-05-20 DIAGNOSIS — R1310 Dysphagia, unspecified: Secondary | ICD-10-CM | POA: Insufficient documentation

## 2012-05-25 ENCOUNTER — Telehealth: Payer: Self-pay | Admitting: *Deleted

## 2012-05-25 NOTE — Telephone Encounter (Signed)
Message copied by Florene Glen on Tue May 25, 2012  9:10 AM ------      Message from: Jarold Motto, DAVID R      Created: Mon May 24, 2012  9:00 AM       Needs EGD ------

## 2012-05-25 NOTE — Telephone Encounter (Signed)
Spoke with wife to inform her of the need for EGD w/dilation post BS. It was apparent that pt did not understand me, so I spoke with her husband. She will have an EGD on 06/02/12. They will come for a PV on 05/27/12.

## 2012-05-26 ENCOUNTER — Telehealth: Payer: Self-pay | Admitting: *Deleted

## 2012-05-26 NOTE — Telephone Encounter (Signed)
Sent a letter to Dr Eden Emms for Plavix management for pt's EGD.

## 2012-05-27 ENCOUNTER — Ambulatory Visit (AMBULATORY_SURGERY_CENTER): Payer: Medicare PPO | Admitting: *Deleted

## 2012-05-27 VITALS — Ht 67.0 in | Wt 159.0 lb

## 2012-05-27 DIAGNOSIS — R1310 Dysphagia, unspecified: Secondary | ICD-10-CM

## 2012-05-27 NOTE — Telephone Encounter (Signed)
Kathy see below 

## 2012-05-27 NOTE — Telephone Encounter (Signed)
No answering machine

## 2012-05-27 NOTE — Telephone Encounter (Signed)
5 days before The done button is grayed out.

## 2012-05-27 NOTE — Telephone Encounter (Signed)
Informed pt's husband to stop the Plavix 5 days before the procedure on 06/02/12; she may take today's dose and he stated understanding.

## 2012-05-27 NOTE — Telephone Encounter (Signed)
-----   Message -----    From: Wendall Stade, MD    Sent: 05/27/2012   8:18 AM      To: Linna Hoff, RN  Ok to stop plavix How do I get your letter out of my inbox??? ----- Message -----    From: Linna Hoff, RN    Sent: 05/26/2012   1:46 PM      To: Wendall Stade, MD Try clicking on DONE and then REFRESH. How many days does she stop PLAVIX ? Thanks.

## 2012-05-31 ENCOUNTER — Encounter: Payer: Self-pay | Admitting: Gastroenterology

## 2012-06-02 ENCOUNTER — Ambulatory Visit (AMBULATORY_SURGERY_CENTER): Payer: Medicare PPO | Admitting: Gastroenterology

## 2012-06-02 ENCOUNTER — Encounter: Payer: Self-pay | Admitting: Gastroenterology

## 2012-06-02 VITALS — BP 169/75 | HR 52 | Temp 98.0°F | Resp 52 | Ht 67.0 in | Wt 159.0 lb

## 2012-06-02 DIAGNOSIS — K222 Esophageal obstruction: Secondary | ICD-10-CM

## 2012-06-02 DIAGNOSIS — R1310 Dysphagia, unspecified: Secondary | ICD-10-CM

## 2012-06-02 MED ORDER — SODIUM CHLORIDE 0.9 % IV SOLN: 500.0000 mL | INTRAVENOUS | Status: DC

## 2012-06-02 NOTE — Op Note (Signed)
[facility] [address] [place], [address]   ENDOSCOPY PROCEDURE REPORT  PATIENT: Jean, [name]  MR#: [id number] BIRTHDATE: [birth year] , 84  yrs. old GENDER: Female ENDOSCOPIST:David Hale Bogus, MD, Clementeen Graham REFERRED BY: Janeal Holmes, M.D. PROCEDURE DATE:  06/02/2012 PROCEDURE:   EGD, diagnostic and Maloney dilation of esophagus ASA CLASS:    Class III INDICATIONS: Dysphagia and stricture on barium swallow. MEDICATION: propofol (Diprivan) 100mg  IV TOPICAL ANESTHETIC:   Cetacaine Spray  DESCRIPTION OF PROCEDURE:   After the risks and benefits of the procedure were explained, informed consent was obtained.  The LB GIF-H180 [id number]  endoscope was introduced through the mouth  and advanced to the second portion of the duodenum .  The instrument was slowly withdrawn as the mucosa was fully examined.      DUODENUM: The duodenal mucosa showed no abnormalities in the 2nd part of the duodenum.  STOMACH; Nodular and erythematous mucosa...no erosions or ulcers noted.   Nodular and erythematous mucosa...no erosions or ulcers noted.    Retroflexed views revealed a 2 cm. hiatal hernia.    The scope was then withdrawn from the patient and the procedure completed.  ESOPHAGUS: normal mucosa and no stricture noted.Dilated # 37F maloney dilator...no heme or pain.  COMPLICATIONS: There were no complications.   ENDOSCOPIC IMPRESSION: 1.   The duodenal mucosa showed no abnormalities in the 2nd part of the duodenum 2.   Nodular and erythematous mucosa...no erosions or ulcers noted in body and fundal areas. 3. Probable occult stricture dilated,?? element of neurogenic dysphagia here.  RECOMMENDATIONS: Continue current meds Needs manometry   _______________________________ eSigned:  Mardella Layman, MD, [facility] 06/02/2012 2:42 PM   standard discharge

## 2012-06-02 NOTE — Patient Instructions (Addendum)
Discharge instructions given with verbal understanding. Handout on a dilatation diet given. Resume previous medication. YOU HAD AN ENDOSCOPIC PROCEDURE TODAY AT THE [facility]: Refer to the procedure report that was given to you for any specific questions about what was found during the examination.  If the procedure report does not answer your questions, please call your gastroenterologist to clarify.  If you requested that your care partner not be given the details of your procedure findings, then the procedure report has been included in a sealed envelope for you to review at your convenience later.  YOU SHOULD EXPECT: Some feelings of bloating in the abdomen. Passage of more gas than usual.  Walking can help get rid of the air that was put into your GI tract during the procedure and reduce the bloating. If you had a lower endoscopy (such as a colonoscopy or flexible sigmoidoscopy) you may notice spotting of blood in your stool or on the toilet paper. If you underwent a bowel prep for your procedure, then you may not have a normal bowel movement for a few days.  DIET: Your first meal following the procedure should be a light meal and then it is ok to progress to your normal diet.  A half-sandwich or bowl of soup is an example of a good first meal.  Heavy or fried foods are harder to digest and may make you feel nauseous or bloated.  Likewise meals heavy in dairy and vegetables can cause extra gas to form and this can also increase the bloating.  Drink plenty of fluids but you should avoid alcoholic beverages for 24 hours.  ACTIVITY: Your care partner should take you home directly after the procedure.  You should plan to take it easy, moving slowly for the rest of the day.  You can resume normal activity the day after the procedure however you should NOT DRIVE or use heavy machinery for 24 hours (because of the sedation medicines used during the test).    SYMPTOMS TO REPORT IMMEDIATELY: A  gastroenterologist can be reached at any hour.  During normal business hours, 8:30 AM to 5:00 PM Monday through Friday, call [phone].  After hours and on weekends, please call the GI answering service at [phone] who will take a message and have the physician on call contact you.   Following upper endoscopy (EGD)  Vomiting of blood or coffee ground material  New chest pain or pain under the shoulder blades  Painful or persistently difficult swallowing  New shortness of breath  Fever of 100F or higher  Black, tarry-looking stools  FOLLOW UP: If any biopsies were taken you will be contacted by phone or by letter within the next 1-3 weeks.  Call your gastroenterologist if you have not heard about the biopsies in 3 weeks.  Our staff will call the home number listed on your records the next business day following your procedure to check on you and address any questions or concerns that you may have at that time regarding the information given to you following your procedure. This is a courtesy call and so if there is no answer at the home number and we have not heard from you through the emergency physician on call, we will assume that you have returned to your regular daily activities without incident.  SIGNATURES/CONFIDENTIALITY: You and/or your care partner have signed paperwork which will be entered into your electronic medical record.  These signatures attest to the fact that that the information  above on your After Visit Summary has been reviewed and is understood.  Full responsibility of the confidentiality of this discharge information lies with you and/or your care-partner. 

## 2012-06-02 NOTE — Progress Notes (Signed)
NO EGG OR SOY ALLERGY. EWM 

## 2012-06-02 NOTE — Progress Notes (Signed)
Called to room to assist during endoscopic procedure.  Patient ID and intended procedure confirmed with present staff. Received instructions for my participation in the procedure from the performing physician.  

## 2012-06-02 NOTE — Progress Notes (Signed)
Patient did not experience any of the following events: a burn prior to discharge; a fall within the facility; wrong site/side/patient/procedure/implant event; or a hospital transfer or hospital admission upon discharge from the facility. (G8907) Patient did not have preoperative order for IV antibiotic SSI prophylaxis. (G8918)  

## 2012-06-03 ENCOUNTER — Telehealth: Payer: Self-pay | Admitting: *Deleted

## 2012-06-03 NOTE — Telephone Encounter (Signed)
  Follow up Call-  Call back number 06/02/2012  Post procedure Call Back phone  # [phone]  Permission to leave phone message Yes     Patient questions:  Do you have a fever, pain , or abdominal swelling? no Pain Score  0 *  Have you tolerated food without any problems? yes  Have you been able to return to your normal activities? yes  Do you have any questions about your discharge instructions: Diet   no Medications  no Follow up visit  no  Do you have questions or concerns about your Care? no  Actions: * If pain score is 4 or above: No action needed, pain <4.

## 2012-08-12 ENCOUNTER — Encounter: Payer: Self-pay | Admitting: Internal Medicine

## 2012-08-12 ENCOUNTER — Ambulatory Visit (INDEPENDENT_AMBULATORY_CARE_PROVIDER_SITE_OTHER): Payer: Medicare PPO | Admitting: Internal Medicine

## 2012-08-12 ENCOUNTER — Other Ambulatory Visit (INDEPENDENT_AMBULATORY_CARE_PROVIDER_SITE_OTHER): Payer: Medicare PPO

## 2012-08-12 VITALS — BP 145/80 | HR 68 | Temp 97.9°F | Resp 16 | Wt 157.0 lb

## 2012-08-12 DIAGNOSIS — R5383 Other fatigue: Secondary | ICD-10-CM | POA: Insufficient documentation

## 2012-08-12 DIAGNOSIS — F329 Major depressive disorder, single episode, unspecified: Secondary | ICD-10-CM

## 2012-08-12 DIAGNOSIS — R5381 Other malaise: Secondary | ICD-10-CM

## 2012-08-12 DIAGNOSIS — R413 Other amnesia: Secondary | ICD-10-CM

## 2012-08-12 DIAGNOSIS — N259 Disorder resulting from impaired renal tubular function, unspecified: Secondary | ICD-10-CM

## 2012-08-12 DIAGNOSIS — F32A Depression, unspecified: Secondary | ICD-10-CM | POA: Insufficient documentation

## 2012-08-12 LAB — CBC WITH DIFFERENTIAL/PLATELET
Eosinophils Relative: 1.7 % (ref 0.0–5.0)
HCT: 40.4 % (ref 36.0–46.0)
Hemoglobin: 13.5 g/dL (ref 12.0–15.0)
Lymphs Abs: 1.9 10*3/uL (ref 0.7–4.0)
MCV: 90.2 fl (ref 78.0–100.0)
Monocytes Absolute: 1 10*3/uL (ref 0.1–1.0)
Monocytes Relative: 11.5 % (ref 3.0–12.0)
Neutro Abs: 5.9 10*3/uL (ref 1.4–7.7)
Platelets: 190 10*3/uL (ref 150.0–400.0)
WBC: 9 10*3/uL (ref 4.5–10.5)

## 2012-08-12 LAB — BASIC METABOLIC PANEL
BUN: 32 mg/dL — ABNORMAL HIGH (ref 6–23)
Chloride: 106 mEq/L (ref 96–112)
GFR: 34 mL/min — ABNORMAL LOW (ref >60.00)
Glucose, Bld: 86 mg/dL (ref 70–99)
Potassium: 4.7 mEq/L (ref 3.5–5.1)
Sodium: 141 mEq/L (ref 135–145)

## 2012-08-12 MED ORDER — FLUOXETINE HCL 10 MG PO TABS: 10.0000 mg | ORAL_TABLET | Freq: Every day | ORAL | Status: DC

## 2012-08-12 MED ORDER — OMEPRAZOLE 20 MG PO CPDR: 20.0000 mg | DELAYED_RELEASE_CAPSULE | Freq: Every day | ORAL | Status: DC

## 2012-08-12 MED ORDER — AMLODIPINE BESYLATE 5 MG PO TABS: 5.0000 mg | ORAL_TABLET | Freq: Every day | ORAL | Status: DC

## 2012-08-12 NOTE — Patient Instructions (Signed)
Stop Methyldopa. Start Amlodipine instead

## 2012-08-12 NOTE — Assessment & Plan Note (Signed)
Start Prozac

## 2012-08-12 NOTE — Assessment & Plan Note (Signed)
Continue with current prescription therapy as reflected on the Med list.  

## 2012-08-12 NOTE — Progress Notes (Signed)
Patient ID: Jean [name], female   DOB: 05-05-1928, 79 y.o.   MRN: [id number]   Subjective:      Back Pain This is a chronic problem. The problem has been gradually improving since onset. The pain is at a severity of 6/10. The pain is moderate. Pertinent negatives include no weakness. She has tried nothing for the symptoms. The treatment provided mild relief.   F/u on LBP - s/p surgery; CVA complicated her recovery... C/o falling - tripping over things, no LOC... She is finished w/PT at home due to poor compliance. Lack of drive. Not getting around much...  The patient presents for a follow-up of  chronic hypertension, chronic dyslipidemia, CAD controlled with medicines  C/o memory issues - worse. Confused. She ran out of the Exelon  Wt Readings from Last 3 Encounters:  08/12/12 157 lb (71.215 kg)  06/02/12 159 lb (72.122 kg)  05/27/12 159 lb (72.122 kg)   BP Readings from Last 3 Encounters:  08/12/12 172/90  06/02/12 169/75  05/18/12 170/108       Review of Systems  Constitutional: Negative for chills, activity change, appetite change, fatigue and unexpected weight change.  HENT: Negative for congestion, mouth sores and sinus pressure.   Eyes: Negative for visual disturbance.  Respiratory: Negative for cough and chest tightness.   Genitourinary: Negative for frequency, difficulty urinating and vaginal pain.  Musculoskeletal: Positive for back pain and gait problem (foot bain is better).  Skin: Negative for pallor and rash.  Neurological: Negative for dizziness, tremors and weakness.  Psychiatric/Behavioral: Negative for confusion and sleep disturbance.       Objective:   Physical Exam  Constitutional: She appears well-developed and well-nourished. No distress.  HENT:  Head: Normocephalic.  Right Ear: External ear normal.  Left Ear: External ear normal.  Nose: Nose normal.  Mouth/Throat: Oropharynx is clear and moist.  Mouth is moist  Eyes: Conjunctivae are  normal. Pupils are equal, round, and reactive to light. Right eye exhibits no discharge. Left eye exhibits no discharge.  Neck: Normal range of motion. Neck supple. No JVD present. No tracheal deviation present. No thyromegaly present.  Cardiovascular: Normal rate, regular rhythm and normal heart sounds.   Pulmonary/Chest: No stridor. No respiratory distress. She has no wheezes.  Abdominal: Soft. Bowel sounds are normal. She exhibits no distension and no mass. There is no tenderness. There is no rebound and no guarding.  Musculoskeletal: She exhibits tenderness (R ankle is tender w/ROM a little). She exhibits no edema.  Lymphadenopathy:    She has no cervical adenopathy.  Neurological: She displays normal reflexes. A cranial nerve deficit is present. She exhibits normal muscle tone. Coordination abnormal.  Walker Unable to get up off the chair No pron drift B No focal sx's Alert, cooperative  Skin: No rash noted. No erythema.  Psychiatric: She has a normal mood and affect. Her behavior is normal.  No tremor Very quiet  Lab Results  Component Value Date   WBC 6.3 01/23/2012   HGB 13.1 01/23/2012   HCT 39.8 01/23/2012   PLT 202.0 01/23/2012   GLUCOSE 95 05/17/2012   CHOL 203* 05/17/2012   TRIG 226.0* 05/17/2012   HDL 43.40 05/17/2012   LDLDIRECT 114.7 05/17/2012   LDLCALC 93 12/08/2011   ALT 15 05/17/2012   AST 22 05/17/2012   NA 138 05/17/2012   K 4.4 05/17/2012   CL 105 05/17/2012   CREATININE 1.5* 05/17/2012   BUN 28* 05/17/2012   CO2 27 05/17/2012  TSH 2.674 06/25/2011   INR 1.29 06/23/2011   HGBA1C 5.7* 07/01/2011   MICROALBUR 1.3 08/17/2006          Assessment & Plan:

## 2012-08-12 NOTE — Assessment & Plan Note (Signed)
Labs

## 2012-08-12 NOTE — Assessment & Plan Note (Signed)
D/c methyldopa

## 2012-08-19 ENCOUNTER — Encounter: Payer: Self-pay | Admitting: *Deleted

## 2012-09-08 ENCOUNTER — Emergency Department (HOSPITAL_BASED_OUTPATIENT_CLINIC_OR_DEPARTMENT_OTHER)
Admission: EM | Admit: 2012-09-08 | Discharge: 2012-09-08 | Disposition: A | Discharge status: Home / Self Care | Payer: Medicare PPO | Attending: Emergency Medicine | Admitting: Emergency Medicine

## 2012-09-08 ENCOUNTER — Encounter (HOSPITAL_BASED_OUTPATIENT_CLINIC_OR_DEPARTMENT_OTHER): Payer: Self-pay | Admitting: Family Medicine

## 2012-09-08 DIAGNOSIS — Z8673 Personal history of transient ischemic attack (TIA), and cerebral infarction without residual deficits: Secondary | ICD-10-CM | POA: Insufficient documentation

## 2012-09-08 DIAGNOSIS — I252 Old myocardial infarction: Secondary | ICD-10-CM | POA: Insufficient documentation

## 2012-09-08 DIAGNOSIS — W1809XA Striking against other object with subsequent fall, initial encounter: Secondary | ICD-10-CM | POA: Insufficient documentation

## 2012-09-08 DIAGNOSIS — Z79899 Other long term (current) drug therapy: Secondary | ICD-10-CM | POA: Insufficient documentation

## 2012-09-08 DIAGNOSIS — Z862 Personal history of diseases of the blood and blood-forming organs and certain disorders involving the immune mechanism: Secondary | ICD-10-CM | POA: Insufficient documentation

## 2012-09-08 DIAGNOSIS — S51809A Unspecified open wound of unspecified forearm, initial encounter: Secondary | ICD-10-CM | POA: Insufficient documentation

## 2012-09-08 DIAGNOSIS — Y9389 Activity, other specified: Secondary | ICD-10-CM | POA: Insufficient documentation

## 2012-09-08 DIAGNOSIS — Z880 Allergy status to penicillin: Secondary | ICD-10-CM | POA: Insufficient documentation

## 2012-09-08 DIAGNOSIS — Z7902 Long term (current) use of antithrombotics/antiplatelets: Secondary | ICD-10-CM | POA: Insufficient documentation

## 2012-09-08 DIAGNOSIS — I509 Heart failure, unspecified: Secondary | ICD-10-CM | POA: Insufficient documentation

## 2012-09-08 DIAGNOSIS — I2510 Atherosclerotic heart disease of native coronary artery without angina pectoris: Secondary | ICD-10-CM | POA: Insufficient documentation

## 2012-09-08 DIAGNOSIS — I129 Hypertensive chronic kidney disease with stage 1 through stage 4 chronic kidney disease, or unspecified chronic kidney disease: Secondary | ICD-10-CM | POA: Insufficient documentation

## 2012-09-08 DIAGNOSIS — Z8679 Personal history of other diseases of the circulatory system: Secondary | ICD-10-CM | POA: Insufficient documentation

## 2012-09-08 DIAGNOSIS — Z23 Encounter for immunization: Secondary | ICD-10-CM | POA: Insufficient documentation

## 2012-09-08 DIAGNOSIS — E039 Hypothyroidism, unspecified: Secondary | ICD-10-CM | POA: Insufficient documentation

## 2012-09-08 DIAGNOSIS — Z9861 Coronary angioplasty status: Secondary | ICD-10-CM | POA: Insufficient documentation

## 2012-09-08 DIAGNOSIS — Y92009 Unspecified place in unspecified non-institutional (private) residence as the place of occurrence of the external cause: Secondary | ICD-10-CM | POA: Insufficient documentation

## 2012-09-08 DIAGNOSIS — Z8639 Personal history of other endocrine, nutritional and metabolic disease: Secondary | ICD-10-CM | POA: Insufficient documentation

## 2012-09-08 DIAGNOSIS — S61509A Unspecified open wound of unspecified wrist, initial encounter: Secondary | ICD-10-CM | POA: Insufficient documentation

## 2012-09-08 DIAGNOSIS — N183 Chronic kidney disease, stage 3 unspecified: Secondary | ICD-10-CM | POA: Insufficient documentation

## 2012-09-08 DIAGNOSIS — Z7982 Long term (current) use of aspirin: Secondary | ICD-10-CM | POA: Insufficient documentation

## 2012-09-08 DIAGNOSIS — T148XXA Other injury of unspecified body region, initial encounter: Secondary | ICD-10-CM

## 2012-09-08 DIAGNOSIS — E785 Hyperlipidemia, unspecified: Secondary | ICD-10-CM | POA: Insufficient documentation

## 2012-09-08 DIAGNOSIS — D649 Anemia, unspecified: Secondary | ICD-10-CM | POA: Insufficient documentation

## 2012-09-08 DIAGNOSIS — E559 Vitamin D deficiency, unspecified: Secondary | ICD-10-CM | POA: Insufficient documentation

## 2012-09-08 DIAGNOSIS — M1990 Unspecified osteoarthritis, unspecified site: Secondary | ICD-10-CM | POA: Insufficient documentation

## 2012-09-08 MED ORDER — TETANUS-DIPHTH-ACELL PERTUSSIS 5-2.5-18.5 LF-MCG/0.5 IM SUSP
INTRAMUSCULAR | Status: AC
  Administered 2012-09-08: 0.5 mL via INTRAMUSCULAR
  Filled 2012-09-08: qty 0.5

## 2012-09-08 MED ORDER — TETANUS-DIPHTH-ACELL PERTUSSIS 5-2.5-18.5 LF-MCG/0.5 IM SUSP: 0.5000 mL | Freq: Once | INTRAMUSCULAR | Status: AC

## 2012-09-08 NOTE — ED Provider Notes (Addendum)
History    CSN: [id number] Arrival date & time 09/08/12  0911  First MD Initiated Contact with Patient 09/08/12 [phone]     Chief Complaint  Patient presents with  . Fall   (Consider location/radiation/quality/duration/timing/severity/associated sxs/prior Treatment) HPI Comments: 79 yo female with htn, requires walker presents with right arm skin tears PTA.  Pt was looking out the window and leaned too far hitting the vent.  Tetanus not UTD.  Pain in skin.  No bone pain.  No head injury.  Pt on plavix generic.  Worse with palpation.  Patient is a 79 y.o. female presenting with fall. The history is provided by the patient and a relative.  Fall This is a new problem. Pertinent negatives include no chest pain, no headaches and no shortness of breath.   Past Medical History  Diagnosis Date  . Hyperlipidemia   . Hypothyroid     hypo  . CHF (congestive heart failure)   . Mitral regurgitation   . Tricuspid regurgitation   . Anemia   . Arthritis     Osteoarthtistis  . Elevated glucose   . CVA (cerebral infarction)   . Vitamin D deficiency   . Myocardial infarction   . Chronic kidney failure     stage 3  . CAD (coronary artery disease)     sees Dr. Ricka Burdock last 03/2011  . Hypertension     Dr. Eden Emms cardiologist, Dr. Cheron Schaumann primary doctor   Past Surgical History  Procedure Laterality Date  . Abdominal hysterectomy    . Back surgery    . Coronary stent placement    . Laparotomy  06/23/2011    Procedure: EXPLORATORY LAPAROTOMY;  Surgeon: Barnett Abu, MD;  Location: [facility];  Service: Neurosurgery;  Laterality: Bilateral;  . Esophagogastroduodenoscopy N/A 04/28/2012    Procedure: ESOPHAGOGASTRODUODENOSCOPY (EGD);  Surgeon: Hart Carwin, MD;  Location: [facility];  Service: Endoscopy;  Laterality: N/A;   Family History  Problem Relation Age of Onset  . Aneurysm Mother   . Heart disease Mother   . Heart attack Father   . Hypertension Father   . Anesthesia problems  Neg Hx   . Colon cancer Neg Hx   . Esophageal cancer Neg Hx    History  Substance Use Topics  . Smoking status: Never Smoker   . Smokeless tobacco: Never Used  . Alcohol Use: No   OB History   Grav Para Term Preterm Abortions TAB SAB Ect Mult Living                 Review of Systems  Respiratory: Negative for shortness of breath.   Cardiovascular: Negative for chest pain.  Gastrointestinal: Negative for vomiting.  Neurological: Negative for weakness, numbness and headaches.    Allergies  Ace inhibitors; Aricept; Ezetimibe-simvastatin; Methyldopa; Rosuvastatin; and Penicillins  Home Medications   Current Outpatient Rx  Name  Route  Sig  Dispense  Refill  . amLODipine (NORVASC) 5 MG tablet   Oral   Take 1 tablet (5 mg total) by mouth daily.   90 tablet   3   . aspirin 325 MG tablet   Oral   Take 325 mg by mouth daily.          . cholecalciferol (VITAMIN D) 1000 UNITS tablet   Oral   Take 1,000 Units by mouth daily.         . clopidogrel (PLAVIX) 75 MG tablet   Oral   Take 1 tablet (75 mg total)  by mouth daily.   90 tablet   2   . Coenzyme Q10 (CO Q 10 PO)   Oral   Take 1 tablet by mouth daily.          . ergocalciferol (VITAMIN D2) 50000 UNITS capsule   Oral   Take 1 capsule (50,000 Units total) by mouth once a week.   6 capsule   0   . FLUoxetine (PROZAC) 10 MG tablet   Oral   Take 1 tablet (10 mg total) by mouth daily.   90 tablet   3   . levothyroxine (SYNTHROID, LEVOTHROID) 75 MCG tablet   Oral   Take 1 tablet (75 mcg total) by mouth daily.   90 tablet   2   . meclizine (ANTIVERT) 25 MG tablet   Oral   Take 1 tablet (25 mg total) by mouth 3 (three) times daily as needed.   270 tablet   3   . metoprolol (LOPRESSOR) 100 MG tablet   Oral   Take 1.5 tablets (150 mg total) by mouth 2 (two) times daily.   270 tablet   1   . nitroGLYCERIN (NITROSTAT) 0.4 MG SL tablet   Sublingual   Place 0.4 mg under the tongue every 5 (five)  minutes as needed. For chest pain         . omeprazole (PRILOSEC) 20 MG capsule   Oral   Take 1 capsule (20 mg total) by mouth daily.   90 capsule   3   . pravastatin (PRAVACHOL) 20 MG tablet   Oral   Take 1 tablet (20 mg total) by mouth 2 (two) times daily.   180 tablet   2   . rivastigmine (EXELON) 9.5 mg/24hr   Transdermal   Place 1 patch (9.5 mg total) onto the skin daily.   90 patch   3   . vitamin B-12 (CYANOCOBALAMIN) 100 MCG tablet   Oral   Take 100 mcg by mouth daily.          BP 151/70  Pulse 60  Temp(Src) 98.9 F (37.2 C) (Oral)  Resp 20  SpO2 100% Physical Exam  Nursing note and vitals reviewed. Constitutional: She appears well-developed and well-nourished. No distress.  HENT:  Head: Normocephalic and atraumatic.  Eyes: Conjunctivae are normal. Pupils are equal, round, and reactive to light.  Neck: Normal range of motion. Neck supple.  Cardiovascular: Normal rate.   Pulmonary/Chest: Effort normal.  Musculoskeletal: Normal range of motion. She exhibits no edema and no tenderness.  Full rom of right elbow and wrist without pain, nv intact distal  Neurological: She is alert.  Skin: Skin is warm.  Right distal arm and proximal forearm 3 superficial skin tears approx 2 cm each, no active bleeding    ED Course  Procedures (including critical care time) Labs Reviewed - No data to display No results found. No diagnosis found.  MDM  No bone pain. Low risk injury.  Pt and husband deny sxs prior. Discussed importance of walker.  Fup discussed.  Tetanus and wound care.  DC  Enid Skeens, MD 09/08/12 1478  Enid Skeens, MD 09/08/12 1043  Enid Skeens, MD 09/08/12 1043            Enid Skeens, MD 09/08/12 1235

## 2012-09-08 NOTE — ED Notes (Signed)
Pt fell in bedroom this morning per husband injuring right arm. Bandage to right elbow and upper arm, bleeding controlled. Pt denies loc, pain.

## 2012-10-29 ENCOUNTER — Other Ambulatory Visit: Payer: Self-pay | Admitting: *Deleted

## 2012-10-29 ENCOUNTER — Other Ambulatory Visit: Payer: Medicare PPO

## 2012-10-29 DIAGNOSIS — R300 Dysuria: Secondary | ICD-10-CM

## 2012-11-02 ENCOUNTER — Other Ambulatory Visit (INDEPENDENT_AMBULATORY_CARE_PROVIDER_SITE_OTHER): Payer: Medicare PPO

## 2012-11-02 DIAGNOSIS — R300 Dysuria: Secondary | ICD-10-CM

## 2012-11-02 LAB — URINALYSIS, ROUTINE W REFLEX MICROSCOPIC
Bilirubin Urine: NEGATIVE
Ketones, ur: NEGATIVE
Total Protein, Urine: 30
Urine Glucose: NEGATIVE
pH: 6 (ref 5.0–8.0)

## 2012-11-05 ENCOUNTER — Telehealth: Payer: Self-pay | Admitting: Internal Medicine

## 2012-11-05 ENCOUNTER — Other Ambulatory Visit: Payer: Self-pay | Admitting: *Deleted

## 2012-11-05 MED ORDER — CIPROFLOXACIN HCL 250 MG PO TABS: 250.0000 mg | ORAL_TABLET | Freq: Two times a day (BID) | ORAL | Status: DC

## 2012-11-05 NOTE — Telephone Encounter (Signed)
Cipro Rx sent per MD. Pt's granddaughter informed.

## 2012-11-05 NOTE — Telephone Encounter (Signed)
Patient is requesting a call back with the results of her urinalysis

## 2012-11-17 ENCOUNTER — Ambulatory Visit (INDEPENDENT_AMBULATORY_CARE_PROVIDER_SITE_OTHER): Payer: Medicare PPO

## 2012-11-17 ENCOUNTER — Ambulatory Visit (INDEPENDENT_AMBULATORY_CARE_PROVIDER_SITE_OTHER): Payer: Medicare PPO | Admitting: Internal Medicine

## 2012-11-17 ENCOUNTER — Encounter: Payer: Self-pay | Admitting: Internal Medicine

## 2012-11-17 VITALS — BP 188/84 | HR 68 | Temp 97.2°F | Resp 16 | Wt 166.0 lb

## 2012-11-17 DIAGNOSIS — M545 Low back pain: Secondary | ICD-10-CM

## 2012-11-17 DIAGNOSIS — R270 Ataxia, unspecified: Secondary | ICD-10-CM

## 2012-11-17 DIAGNOSIS — Z23 Encounter for immunization: Secondary | ICD-10-CM

## 2012-11-17 DIAGNOSIS — R296 Repeated falls: Secondary | ICD-10-CM

## 2012-11-17 DIAGNOSIS — R413 Other amnesia: Secondary | ICD-10-CM

## 2012-11-17 DIAGNOSIS — F29 Unspecified psychosis not due to a substance or known physiological condition: Secondary | ICD-10-CM

## 2012-11-17 DIAGNOSIS — R279 Unspecified lack of coordination: Secondary | ICD-10-CM

## 2012-11-17 DIAGNOSIS — Z9181 History of falling: Secondary | ICD-10-CM

## 2012-11-17 DIAGNOSIS — I10 Essential (primary) hypertension: Secondary | ICD-10-CM

## 2012-11-17 DIAGNOSIS — R410 Disorientation, unspecified: Secondary | ICD-10-CM

## 2012-11-17 DIAGNOSIS — R609 Edema, unspecified: Secondary | ICD-10-CM | POA: Insufficient documentation

## 2012-11-17 DIAGNOSIS — F0280 Dementia in other diseases classified elsewhere without behavioral disturbance: Secondary | ICD-10-CM

## 2012-11-17 LAB — BASIC METABOLIC PANEL
BUN: 29 mg/dL — ABNORMAL HIGH (ref 6–23)
Calcium: 9.5 mg/dL (ref 8.4–10.5)
GFR: 27.87 mL/min — ABNORMAL LOW (ref >60.00)
Potassium: 4.3 mEq/L (ref 3.5–5.1)
Sodium: 137 mEq/L (ref 135–145)

## 2012-11-17 MED ORDER — LOSARTAN POTASSIUM 100 MG PO TABS: 100.0000 mg | ORAL_TABLET | Freq: Every day | ORAL | Status: DC

## 2012-11-17 NOTE — Assessment & Plan Note (Addendum)
Continue with current prescription therapy as reflected on the Med list. Labs  

## 2012-11-17 NOTE — Assessment & Plan Note (Signed)
Stable chronic dementia RN, SW consult at home Placement seems to be needed soon Discussed w/family

## 2012-11-17 NOTE — Progress Notes (Signed)
Subjective:      Back Pain This is a chronic problem. The problem has been gradually improving since onset. The pain is at a severity of 6/10. The pain is moderate. Associated symptoms include weakness. She has tried nothing for the symptoms. The treatment provided mild relief.   F/u on LBP - s/p surgery; CVA complicated her recovery... C/o falling - tripping over things, no LOC... She is finished w/PT at home due to poor compliance. Lack of drive. Not getting around much...  The patient presents for a follow-up of  chronic hypertension, chronic dyslipidemia, CAD controlled with medicines  C/o memory issues - worse. Confused. She ran out of the [employer]  Wt Readings from Last 3 Encounters:  11/17/12 166 lb (75.297 kg)  08/12/12 157 lb (71.215 kg)  06/02/12 159 lb (72.122 kg)   BP Readings from Last 3 Encounters:  11/17/12 188/84  09/08/12 151/70  08/12/12 145/80       Review of Systems  Constitutional: Positive for fatigue. Negative for chills, activity change, appetite change and unexpected weight change.  HENT: Negative for congestion, mouth sores and sinus pressure.   Eyes: Negative for visual disturbance.  Respiratory: Negative for cough and chest tightness.   Genitourinary: Negative for frequency, difficulty urinating and vaginal pain.  Musculoskeletal: Positive for back pain and gait problem (foot bain is better).  Skin: Negative for pallor and rash.  Neurological: Positive for weakness. Negative for dizziness and tremors.  Psychiatric/Behavioral: Positive for confusion and decreased concentration. Negative for suicidal ideas, sleep disturbance and self-injury.       Objective:   Physical Exam  Constitutional: She appears well-developed and well-nourished. No distress.  w/c  HENT:  Head: Normocephalic.  Right Ear: External ear normal.  Left Ear: External ear normal.  Nose: Nose normal.  Mouth/Throat: Oropharynx is clear and moist. No oropharyngeal exudate.   Mouth is moist  Eyes: Conjunctivae are normal. Pupils are equal, round, and reactive to light. Right eye exhibits no discharge. Left eye exhibits no discharge.  Neck: Normal range of motion. Neck supple. No JVD present. No tracheal deviation present. No thyromegaly present.  Cardiovascular: Normal rate, regular rhythm and normal heart sounds.   Pulmonary/Chest: No stridor. No respiratory distress. She has no wheezes.  Abdominal: Soft. Bowel sounds are normal. She exhibits no distension and no mass. There is no tenderness. There is no rebound and no guarding.  Musculoskeletal: She exhibits edema (1+ B) and tenderness (R ankle is tender w/ROM a little).  Lymphadenopathy:    She has no cervical adenopathy.  Neurological: She displays normal reflexes. A cranial nerve deficit is present. She exhibits normal muscle tone. Coordination abnormal.  Walker Unable to get up off the chair No pron drift B No focal sx's Alert, cooperative  Skin: Skin is warm and dry. No rash noted. No erythema.  Psychiatric: She has a normal mood and affect. Her behavior is normal.  No tremor Very quiet  Lab Results  Component Value Date   WBC 9.0 08/12/2012   HGB 13.5 08/12/2012   HCT 40.4 08/12/2012   PLT 190.0 08/12/2012   GLUCOSE 86 08/12/2012   CHOL 203* 05/17/2012   TRIG 226.0* 05/17/2012   HDL 43.40 05/17/2012   LDLDIRECT 114.7 05/17/2012   LDLCALC 93 12/08/2011   ALT 15 05/17/2012   AST 22 05/17/2012   NA 141 08/12/2012   K 4.7 08/12/2012   CL 106 08/12/2012   CREATININE 1.6* 08/12/2012   BUN 32* 08/12/2012   CO2  28 08/12/2012   TSH 2.41 08/12/2012   INR 1.29 06/23/2011   HGBA1C 5.7* 07/01/2011   MICROALBUR 1.3 08/17/2006       A complex case. Discussed w/family   Assessment & Plan:

## 2012-11-17 NOTE — Assessment & Plan Note (Signed)
Repeat labs:

## 2012-11-17 NOTE — Assessment & Plan Note (Signed)
Stable chronic LBP and leg weakness RN, SW consult at home Placement seems to be needed soon Discussed w/family

## 2012-11-17 NOTE — Assessment & Plan Note (Signed)
W/c She did not comply w/PT last year

## 2012-11-17 NOTE — Assessment & Plan Note (Signed)
Worse 

## 2012-11-17 NOTE — Assessment & Plan Note (Signed)
2014 LEs multifactorial Labs Compr socks D/c Norvasc Diuretics - will hold off due to chronic urinary issues

## 2012-11-17 NOTE — Assessment & Plan Note (Signed)
2013 worse s/p back surgery Chronic Worse

## 2012-11-18 ENCOUNTER — Other Ambulatory Visit: Payer: Self-pay | Admitting: *Deleted

## 2012-11-18 DIAGNOSIS — N390 Urinary tract infection, site not specified: Secondary | ICD-10-CM

## 2012-11-18 LAB — URINALYSIS, ROUTINE W REFLEX MICROSCOPIC
Bilirubin Urine: NEGATIVE
Nitrite: NEGATIVE
Specific Gravity, Urine: 1.015 (ref 1.000–1.030)
Urobilinogen, UA: 0.2 (ref 0.0–1.0)
pH: 6.5 (ref 5.0–8.0)

## 2012-11-19 ENCOUNTER — Other Ambulatory Visit: Payer: Medicare PPO

## 2012-11-19 DIAGNOSIS — N390 Urinary tract infection, site not specified: Secondary | ICD-10-CM

## 2012-11-21 LAB — URINE CULTURE

## 2012-11-22 ENCOUNTER — Telehealth: Payer: Self-pay | Admitting: *Deleted

## 2012-11-22 ENCOUNTER — Other Ambulatory Visit: Payer: Self-pay | Admitting: Internal Medicine

## 2012-11-22 MED ORDER — SULFAMETHOXAZOLE-TRIMETHOPRIM 800-160 MG PO TABS: 1.0000 | ORAL_TABLET | Freq: Two times a day (BID) | ORAL | Status: DC

## 2012-11-22 NOTE — Telephone Encounter (Signed)
Caller left vm stating she seen pt in home today for skilled nursing/monitoring her meds, HTN and pain. Her vitals were normal today. She also states the pt and family are in agreement for OT and PT. Any questions call Okey Regal at [phone].

## 2012-11-23 NOTE — Telephone Encounter (Signed)
Noted. Pls make sure she is taking Bactrim for UTI Thx

## 2012-11-23 NOTE — Telephone Encounter (Signed)
Pt's spouse informed 

## 2012-11-26 ENCOUNTER — Encounter (HOSPITAL_BASED_OUTPATIENT_CLINIC_OR_DEPARTMENT_OTHER): Payer: Self-pay | Admitting: *Deleted

## 2012-11-26 ENCOUNTER — Emergency Department (HOSPITAL_BASED_OUTPATIENT_CLINIC_OR_DEPARTMENT_OTHER): Payer: Medicare PPO

## 2012-11-26 ENCOUNTER — Inpatient Hospital Stay (HOSPITAL_BASED_OUTPATIENT_CLINIC_OR_DEPARTMENT_OTHER)
Admission: EM | Admit: 2012-11-26 | Discharge: 2012-11-30 | DRG: 092 | Disposition: A | Discharge status: Skilled Nursing Facility | Payer: Medicare PPO | Attending: Internal Medicine | Admitting: Internal Medicine

## 2012-11-26 DIAGNOSIS — S82899A Other fracture of unspecified lower leg, initial encounter for closed fracture: Secondary | ICD-10-CM

## 2012-11-26 DIAGNOSIS — Z9181 History of falling: Secondary | ICD-10-CM

## 2012-11-26 DIAGNOSIS — I509 Heart failure, unspecified: Secondary | ICD-10-CM | POA: Diagnosis present

## 2012-11-26 DIAGNOSIS — I2510 Atherosclerotic heart disease of native coronary artery without angina pectoris: Secondary | ICD-10-CM

## 2012-11-26 DIAGNOSIS — F0280 Dementia in other diseases classified elsewhere without behavioral disturbance: Secondary | ICD-10-CM

## 2012-11-26 DIAGNOSIS — W010XXA Fall on same level from slipping, tripping and stumbling without subsequent striking against object, initial encounter: Secondary | ICD-10-CM | POA: Diagnosis present

## 2012-11-26 DIAGNOSIS — R296 Repeated falls: Secondary | ICD-10-CM

## 2012-11-26 DIAGNOSIS — R252 Cramp and spasm: Secondary | ICD-10-CM

## 2012-11-26 DIAGNOSIS — I059 Rheumatic mitral valve disease, unspecified: Secondary | ICD-10-CM | POA: Diagnosis present

## 2012-11-26 DIAGNOSIS — M5416 Radiculopathy, lumbar region: Secondary | ICD-10-CM

## 2012-11-26 DIAGNOSIS — K5289 Other specified noninfective gastroenteritis and colitis: Secondary | ICD-10-CM

## 2012-11-26 DIAGNOSIS — N183 Chronic kidney disease, stage 3 unspecified: Secondary | ICD-10-CM | POA: Diagnosis present

## 2012-11-26 DIAGNOSIS — Z9889 Other specified postprocedural states: Secondary | ICD-10-CM

## 2012-11-26 DIAGNOSIS — R1319 Other dysphagia: Secondary | ICD-10-CM

## 2012-11-26 DIAGNOSIS — I10 Essential (primary) hypertension: Secondary | ICD-10-CM

## 2012-11-26 DIAGNOSIS — R609 Edema, unspecified: Secondary | ICD-10-CM

## 2012-11-26 DIAGNOSIS — M545 Low back pain, unspecified: Secondary | ICD-10-CM

## 2012-11-26 DIAGNOSIS — R55 Syncope and collapse: Secondary | ICD-10-CM

## 2012-11-26 DIAGNOSIS — R269 Unspecified abnormalities of gait and mobility: Principal | ICD-10-CM | POA: Diagnosis present

## 2012-11-26 DIAGNOSIS — I079 Rheumatic tricuspid valve disease, unspecified: Secondary | ICD-10-CM | POA: Diagnosis present

## 2012-11-26 DIAGNOSIS — R059 Cough, unspecified: Secondary | ICD-10-CM

## 2012-11-26 DIAGNOSIS — R270 Ataxia, unspecified: Secondary | ICD-10-CM

## 2012-11-26 DIAGNOSIS — R5383 Other fatigue: Secondary | ICD-10-CM

## 2012-11-26 DIAGNOSIS — L600 Ingrowing nail: Secondary | ICD-10-CM

## 2012-11-26 DIAGNOSIS — R05 Cough: Secondary | ICD-10-CM

## 2012-11-26 DIAGNOSIS — Z9861 Coronary angioplasty status: Secondary | ICD-10-CM

## 2012-11-26 DIAGNOSIS — M1990 Unspecified osteoarthritis, unspecified site: Secondary | ICD-10-CM

## 2012-11-26 DIAGNOSIS — Y92009 Unspecified place in unspecified non-institutional (private) residence as the place of occurrence of the external cause: Secondary | ICD-10-CM

## 2012-11-26 DIAGNOSIS — S7000XA Contusion of unspecified hip, initial encounter: Secondary | ICD-10-CM | POA: Diagnosis present

## 2012-11-26 DIAGNOSIS — E876 Hypokalemia: Secondary | ICD-10-CM

## 2012-11-26 DIAGNOSIS — S0100XA Unspecified open wound of scalp, initial encounter: Secondary | ICD-10-CM | POA: Diagnosis present

## 2012-11-26 DIAGNOSIS — I5022 Chronic systolic (congestive) heart failure: Secondary | ICD-10-CM | POA: Diagnosis present

## 2012-11-26 DIAGNOSIS — S93409A Sprain of unspecified ligament of unspecified ankle, initial encounter: Secondary | ICD-10-CM

## 2012-11-26 DIAGNOSIS — I252 Old myocardial infarction: Secondary | ICD-10-CM

## 2012-11-26 DIAGNOSIS — J811 Chronic pulmonary edema: Secondary | ICD-10-CM

## 2012-11-26 DIAGNOSIS — F0390 Unspecified dementia without behavioral disturbance: Secondary | ICD-10-CM | POA: Diagnosis present

## 2012-11-26 DIAGNOSIS — N259 Disorder resulting from impaired renal tubular function, unspecified: Secondary | ICD-10-CM

## 2012-11-26 DIAGNOSIS — Z79899 Other long term (current) drug therapy: Secondary | ICD-10-CM

## 2012-11-26 DIAGNOSIS — F32A Depression, unspecified: Secondary | ICD-10-CM

## 2012-11-26 DIAGNOSIS — R413 Other amnesia: Secondary | ICD-10-CM

## 2012-11-26 DIAGNOSIS — Z8673 Personal history of transient ischemic attack (TIA), and cerebral infarction without residual deficits: Secondary | ICD-10-CM

## 2012-11-26 DIAGNOSIS — Z7902 Long term (current) use of antithrombotics/antiplatelets: Secondary | ICD-10-CM

## 2012-11-26 DIAGNOSIS — E785 Hyperlipidemia, unspecified: Secondary | ICD-10-CM

## 2012-11-26 DIAGNOSIS — Z7982 Long term (current) use of aspirin: Secondary | ICD-10-CM

## 2012-11-26 DIAGNOSIS — R262 Difficulty in walking, not elsewhere classified: Secondary | ICD-10-CM

## 2012-11-26 DIAGNOSIS — S0101XA Laceration without foreign body of scalp, initial encounter: Secondary | ICD-10-CM

## 2012-11-26 DIAGNOSIS — N179 Acute kidney failure, unspecified: Secondary | ICD-10-CM

## 2012-11-26 DIAGNOSIS — R410 Disorientation, unspecified: Secondary | ICD-10-CM

## 2012-11-26 DIAGNOSIS — W19XXXA Unspecified fall, initial encounter: Secondary | ICD-10-CM

## 2012-11-26 DIAGNOSIS — I129 Hypertensive chronic kidney disease with stage 1 through stage 4 chronic kidney disease, or unspecified chronic kidney disease: Secondary | ICD-10-CM | POA: Diagnosis present

## 2012-11-26 DIAGNOSIS — N390 Urinary tract infection, site not specified: Secondary | ICD-10-CM

## 2012-11-26 DIAGNOSIS — I5020 Unspecified systolic (congestive) heart failure: Secondary | ICD-10-CM

## 2012-11-26 DIAGNOSIS — M25571 Pain in right ankle and joints of right foot: Secondary | ICD-10-CM

## 2012-11-26 DIAGNOSIS — E559 Vitamin D deficiency, unspecified: Secondary | ICD-10-CM

## 2012-11-26 DIAGNOSIS — E039 Hypothyroidism, unspecified: Secondary | ICD-10-CM

## 2012-11-26 DIAGNOSIS — F329 Major depressive disorder, single episode, unspecified: Secondary | ICD-10-CM

## 2012-11-26 LAB — BASIC METABOLIC PANEL
BUN: 27 mg/dL — ABNORMAL HIGH (ref 6–23)
Calcium: 10.2 mg/dL (ref 8.4–10.5)
Chloride: 104 mEq/L (ref 96–112)
Creatinine, Ser: 2 mg/dL — ABNORMAL HIGH (ref 0.50–1.10)
GFR calc Af Amer: 26 mL/min — ABNORMAL LOW (ref >90)
GFR calc non Af Amer: 22 mL/min — ABNORMAL LOW (ref >90)

## 2012-11-26 LAB — CBC
HCT: 34.4 % — ABNORMAL LOW (ref 36.0–46.0)
HCT: 33.7 % — ABNORMAL LOW (ref 36.0–46.0)
MCH: 30.1 pg (ref 26.0–34.0)
MCHC: 33.1 g/dL (ref 30.0–36.0)
MCHC: 34.1 g/dL (ref 30.0–36.0)
MCV: 89.6 fL (ref 78.0–100.0)
MCV: 88.2 fL (ref 78.0–100.0)
Platelets: 222 10*3/uL (ref 150–400)
RBC: 3.82 MIL/uL — ABNORMAL LOW (ref 3.87–5.11)
RDW: 13.1 % (ref 11.5–15.5)

## 2012-11-26 LAB — CREATININE, SERUM: Creatinine, Ser: 1.9 mg/dL — ABNORMAL HIGH (ref 0.50–1.10)

## 2012-11-26 LAB — TROPONIN I: Troponin I: <0.3 ng/mL (ref <0.30)

## 2012-11-26 MED ORDER — METOPROLOL TARTRATE 100 MG PO TABS
150.0000 mg | ORAL_TABLET | Freq: Two times a day (BID) | ORAL | Status: DC
  Administered 2012-11-27 – 2012-11-30 (×6): 150 mg via ORAL
  Filled 2012-11-26 (×9): qty 1

## 2012-11-26 MED ORDER — MORPHINE SULFATE 2 MG/ML IJ SOLN: 1.0000 mg | INTRAMUSCULAR | Status: DC | PRN

## 2012-11-26 MED ORDER — DOCUSATE SODIUM 100 MG PO CAPS
100.0000 mg | ORAL_CAPSULE | Freq: Two times a day (BID) | ORAL | Status: DC
  Administered 2012-11-26 – 2012-11-30 (×7): 100 mg via ORAL
  Filled 2012-11-26 (×9): qty 1

## 2012-11-26 MED ORDER — NITROGLYCERIN 0.4 MG SL SUBL: 0.4000 mg | SUBLINGUAL_TABLET | SUBLINGUAL | Status: DC | PRN

## 2012-11-26 MED ORDER — FLUOXETINE HCL 20 MG PO TABS
10.0000 mg | ORAL_TABLET | Freq: Every day | ORAL | Status: DC
  Filled 2012-11-26: qty 1

## 2012-11-26 MED ORDER — OXYCODONE HCL 5 MG PO TABS: 5.0000 mg | ORAL_TABLET | ORAL | Status: DC | PRN

## 2012-11-26 MED ORDER — ACETAMINOPHEN 650 MG RE SUPP: 650.0000 mg | Freq: Four times a day (QID) | RECTAL | Status: DC | PRN

## 2012-11-26 MED ORDER — PANTOPRAZOLE SODIUM 40 MG PO TBEC
40.0000 mg | DELAYED_RELEASE_TABLET | Freq: Every day | ORAL | Status: DC
  Administered 2012-11-26 – 2012-11-30 (×5): 40 mg via ORAL
  Filled 2012-11-26 (×5): qty 1

## 2012-11-26 MED ORDER — SODIUM CHLORIDE 0.9 % IV SOLN
INTRAVENOUS | Status: AC
  Administered 2012-11-26: 17:00:00 via INTRAVENOUS

## 2012-11-26 MED ORDER — CLOPIDOGREL BISULFATE 75 MG PO TABS
75.0000 mg | ORAL_TABLET | Freq: Every day | ORAL | Status: DC
  Administered 2012-11-26 – 2012-11-30 (×5): 75 mg via ORAL
  Filled 2012-11-26 (×5): qty 1

## 2012-11-26 MED ORDER — LEVOTHYROXINE SODIUM 75 MCG PO TABS
75.0000 ug | ORAL_TABLET | Freq: Every day | ORAL | Status: DC
Start: 2012-11-27 — End: 2012-11-30
  Administered 2012-11-27 – 2012-11-30 (×4): 75 ug via ORAL
  Filled 2012-11-26 (×6): qty 1

## 2012-11-26 MED ORDER — SODIUM CHLORIDE 0.9 % IJ SOLN
3.0000 mL | Freq: Two times a day (BID) | INTRAMUSCULAR | Status: DC
  Administered 2012-11-27 – 2012-11-30 (×7): 3 mL via INTRAVENOUS

## 2012-11-26 MED ORDER — VITAMIN B-12 100 MCG PO TABS
100.0000 ug | ORAL_TABLET | Freq: Every day | ORAL | Status: DC
  Administered 2012-11-26 – 2012-11-30 (×5): 100 ug via ORAL
  Filled 2012-11-26 (×6): qty 1

## 2012-11-26 MED ORDER — MECLIZINE HCL 25 MG PO TABS
25.0000 mg | ORAL_TABLET | Freq: Three times a day (TID) | ORAL | Status: DC | PRN
  Filled 2012-11-26: qty 1

## 2012-11-26 MED ORDER — VITAMIN D3 25 MCG (1000 UNIT) PO TABS
1000.0000 [IU] | ORAL_TABLET | Freq: Every day | ORAL | Status: DC
  Administered 2012-11-26 – 2012-11-30 (×5): 1000 [IU] via ORAL
  Filled 2012-11-26 (×5): qty 1

## 2012-11-26 MED ORDER — ACETAMINOPHEN 325 MG PO TABS: 650.0000 mg | ORAL_TABLET | Freq: Four times a day (QID) | ORAL | Status: DC | PRN

## 2012-11-26 MED ORDER — LOSARTAN POTASSIUM 50 MG PO TABS
100.0000 mg | ORAL_TABLET | Freq: Every day | ORAL | Status: DC
  Administered 2012-11-26 – 2012-11-30 (×5): 100 mg via ORAL
  Filled 2012-11-26 (×5): qty 2

## 2012-11-26 MED ORDER — ONDANSETRON HCL 4 MG/2ML IJ SOLN: 4.0000 mg | Freq: Four times a day (QID) | INTRAMUSCULAR | Status: DC | PRN

## 2012-11-26 MED ORDER — FLUOXETINE HCL 10 MG PO CAPS
10.0000 mg | ORAL_CAPSULE | Freq: Every day | ORAL | Status: DC
  Administered 2012-11-26 – 2012-11-30 (×5): 10 mg via ORAL
  Filled 2012-11-26 (×5): qty 1

## 2012-11-26 MED ORDER — AMLODIPINE BESYLATE 5 MG PO TABS
5.0000 mg | ORAL_TABLET | Freq: Every day | ORAL | Status: DC
  Administered 2012-11-26 – 2012-11-30 (×5): 5 mg via ORAL
  Filled 2012-11-26 (×5): qty 1

## 2012-11-26 MED ORDER — ONDANSETRON HCL 4 MG PO TABS: 4.0000 mg | ORAL_TABLET | Freq: Four times a day (QID) | ORAL | Status: DC | PRN

## 2012-11-26 MED ORDER — ASPIRIN 325 MG PO TABS
325.0000 mg | ORAL_TABLET | Freq: Every day | ORAL | Status: DC
  Administered 2012-11-26 – 2012-11-27 (×2): 325 mg via ORAL
  Filled 2012-11-26 (×2): qty 1

## 2012-11-26 MED ORDER — HEPARIN SODIUM (PORCINE) 5000 UNIT/ML IJ SOLN
5000.0000 [IU] | Freq: Three times a day (TID) | INTRAMUSCULAR | Status: DC
  Administered 2012-11-26 – 2012-11-30 (×13): 5000 [IU] via SUBCUTANEOUS
  Filled 2012-11-26 (×15): qty 1

## 2012-11-26 MED ORDER — SENNA 8.6 MG PO TABS
1.0000 | ORAL_TABLET | Freq: Two times a day (BID) | ORAL | Status: DC
  Administered 2012-11-26 – 2012-11-30 (×8): 8.6 mg via ORAL
  Filled 2012-11-26 (×10): qty 1

## 2012-11-26 NOTE — ED Provider Notes (Signed)
CSN: [id number]     Arrival date & time 11/26/12  4098 History   First MD Initiated Contact with Patient 11/26/12 0930     Chief Complaint  Patient presents with  . Fall   (Consider location/radiation/quality/duration/timing/severity/associated sxs/prior Treatment) HPI Comments: 79 year old female presents with 3 falls in the last 24 hours. Each time she there has trouble walking or trouble in sitting down. She and the family state that these are not syncope episodes. She has chronic right lower extremity weakness and trouble walking, which usually causes her to fall. They're concerned due to the increased frequency of falls of last couple days as well as the fact that she hit her head twice in the last 24 hours and is on Plavix. She is acting at her normal baseline per her family. She is currently finishing up a course of Bactrim for a UTI. Denies any new weakness or numbness. Family has not noticed any slurred speech or trouble smiling. Denies any chest pain or trouble breathing. The family does have some trouble at home as the husband is not able to fully the patient when she falls. Due to this they are working with the PCP is trying to get social work involved for either home health or possibly nursing home placement.  The history is provided by a relative, the spouse and the patient.    Past Medical History  Diagnosis Date  . Hyperlipidemia   . Hypothyroid     hypo  . CHF (congestive heart failure)   . Mitral regurgitation   . Tricuspid regurgitation   . Anemia   . Arthritis     Osteoarthtistis  . Elevated glucose   . CVA (cerebral infarction)   . Vitamin D deficiency   . Myocardial infarction   . Chronic kidney failure     stage 3  . CAD (coronary artery disease)     sees Dr. Ricka Burdock last 03/2011  . Hypertension     Dr. Eden Emms cardiologist, Dr. Cheron Schaumann primary doctor   Past Surgical History  Procedure Laterality Date  . Abdominal hysterectomy    . Back surgery    .  Coronary stent placement    . Laparotomy  06/23/2011    Procedure: EXPLORATORY LAPAROTOMY;  Surgeon: Barnett Abu, MD;  Location: [facility];  Service: Neurosurgery;  Laterality: Bilateral;  . Esophagogastroduodenoscopy N/A 04/28/2012    Procedure: ESOPHAGOGASTRODUODENOSCOPY (EGD);  Surgeon: Hart Carwin, MD;  Location: [facility];  Service: Endoscopy;  Laterality: N/A;   Family History  Problem Relation Age of Onset  . Aneurysm Mother   . Heart disease Mother   . Heart attack Father   . Hypertension Father   . Anesthesia problems Neg Hx   . Colon cancer Neg Hx   . Esophageal cancer Neg Hx    History  Substance Use Topics  . Smoking status: Never Smoker   . Smokeless tobacco: Never Used  . Alcohol Use: No   OB History   Grav Para Term Preterm Abortions TAB SAB Ect Mult Living                 Review of Systems  Constitutional: Negative for fever and chills.  Respiratory: Negative for shortness of breath.   Cardiovascular: Negative for chest pain.  Gastrointestinal: Negative for vomiting and abdominal pain.  Musculoskeletal: Positive for gait problem.  Neurological: Negative for speech difficulty, weakness and numbness.  All other systems reviewed and are negative.    Allergies  Ace inhibitors; Aricept;  Ezetimibe-simvastatin; Methyldopa; Rosuvastatin; and Penicillins  Home Medications   Current Outpatient Rx  Name  Route  Sig  Dispense  Refill  . amLODipine (NORVASC) 5 MG tablet   Oral   Take 1 tablet (5 mg total) by mouth daily.   90 tablet   3   . aspirin 325 MG tablet   Oral   Take 325 mg by mouth daily.          . cholecalciferol (VITAMIN D) 1000 UNITS tablet   Oral   Take 1,000 Units by mouth daily.         . clopidogrel (PLAVIX) 75 MG tablet   Oral   Take 1 tablet (75 mg total) by mouth daily.   90 tablet   2   . Coenzyme Q10 (CO Q 10 PO)   Oral   Take 1 tablet by mouth daily.          . ergocalciferol (VITAMIN D2) 50000 UNITS  capsule   Oral   Take 1 capsule (50,000 Units total) by mouth once a week.   6 capsule   0   . FLUoxetine (PROZAC) 10 MG tablet   Oral   Take 1 tablet (10 mg total) by mouth daily.   90 tablet   3   . levothyroxine (SYNTHROID, LEVOTHROID) 75 MCG tablet   Oral   Take 1 tablet (75 mcg total) by mouth daily.   90 tablet   2   . losartan (COZAAR) 100 MG tablet   Oral   Take 1 tablet (100 mg total) by mouth daily.   30 tablet   11   . meclizine (ANTIVERT) 25 MG tablet   Oral   Take 1 tablet (25 mg total) by mouth 3 (three) times daily as needed.   270 tablet   3   . metoprolol (LOPRESSOR) 100 MG tablet   Oral   Take 1.5 tablets (150 mg total) by mouth 2 (two) times daily.   270 tablet   1   . nitroGLYCERIN (NITROSTAT) 0.4 MG SL tablet   Sublingual   Place 0.4 mg under the tongue every 5 (five) minutes as needed. For chest pain         . omeprazole (PRILOSEC) 20 MG capsule   Oral   Take 1 capsule (20 mg total) by mouth daily.   90 capsule   3   . pravastatin (PRAVACHOL) 20 MG tablet   Oral   Take 1 tablet (20 mg total) by mouth 2 (two) times daily.   180 tablet   2   . rivastigmine (EXELON) 9.5 mg/24hr   Transdermal   Place 1 patch (9.5 mg total) onto the skin daily.   90 patch   3   . sulfamethoxazole-trimethoprim (SEPTRA DS) 800-160 MG per tablet   Oral   Take 1 tablet by mouth 2 (two) times daily.   20 tablet   1   . vitamin B-12 (CYANOCOBALAMIN) 100 MCG tablet   Oral   Take 100 mcg by mouth daily.          BP 174/58  Pulse 58  Temp(Src) 97.6 F (36.4 C) (Oral)  Resp 16  Ht 5' 7.5" (1.715 m)  Wt 160 lb (72.576 kg)  BMI 24.68 kg/m2  SpO2 98% Physical Exam  Vitals reviewed. Constitutional: She is oriented to person, place, and time. She appears well-developed and well-nourished. No distress.  HENT:  Head: Normocephalic.    Right Ear: External ear normal.  Left  Ear: External ear normal.  Nose: Nose normal.  Eyes: EOM are normal.  Pupils are equal, round, and reactive to light. Right eye exhibits no discharge. Left eye exhibits no discharge.  Cardiovascular: Normal rate, regular rhythm and normal heart sounds.   Pulmonary/Chest: Effort normal and breath sounds normal.  Abdominal: Soft. There is no tenderness.  Neurological: She is alert and oriented to person, place, and time. She has normal strength. No cranial nerve deficit or sensory deficit.  Skin: Skin is warm and dry.    ED Course  LACERATION REPAIR Date/Time: 11/26/2012 11:47 AM Performed by: Pricilla Loveless T Authorized by: Pricilla Loveless T Consent: Verbal consent obtained. Risks and benefits: risks, benefits and alternatives were discussed Body area: head/neck Location details: scalp Laceration length: 1 cm Foreign bodies: no foreign bodies Tendon involvement: none Nerve involvement: none Vascular damage: no Patient sedated: no Preparation: Patient was prepped and draped in the usual sterile fashion. Irrigation solution: saline Amount of cleaning: standard Debridement: none Degree of undermining: none Skin closure: glue Technique: simple Approximation: close Approximation difficulty: simple Patient tolerance: Patient tolerated the procedure well with no immediate complications.   (including critical care time) Labs Review Labs Reviewed  CBC - Abnormal; Notable for the following:    RBC 3.84 (*)    Hemoglobin 11.4 (*)    HCT 34.4 (*)    All other components within normal limits  BASIC METABOLIC PANEL - Abnormal; Notable for the following:    Glucose, Bld 105 (*)    BUN 27 (*)    Creatinine, Ser 2.00 (*)    GFR calc non Af Amer 22 (*)    GFR calc Af Amer 26 (*)    All other components within normal limits    Date: 11/26/2012  Rate: 57  Rhythm: sinus bradycardia  QRS Axis: left  Intervals: PR prolonged  ST/T Wave abnormalities: nonspecific T wave changes  Conduction Disutrbances:first-degree A-V block   Narrative Interpretation:    Old EKG Reviewed: unchanged   Imaging Review Ct Head Wo Contrast  11/26/2012   CLINICAL DATA:  Fall. Head injury. The patient takes Plavix.  EXAM: CT HEAD WITHOUT CONTRAST  TECHNIQUE: Contiguous axial images were obtained from the base of the skull through the vertex without intravenous contrast.  COMPARISON:  CT head without contrast 02/05/2012 by our health care.  FINDINGS: No acute intracranial abnormality is present. No acute cortical infarct hemorrhage, or mass lesion is present. Atrophy and extensive white matter disease is similar to prior exam. Remote lacunar infarcts of the basal ganglia and cerebellum are stable. A left PICA territory infarct is stable. The ventricles are proportionate to the degree of atrophy. No significant extra-axial fluid collection is present.  A left parietal scalp hematoma is present. A small right parietal scalp hematoma is noted as well. There is no underlying fracture. The paranasal sinuses and mastoid air cells are clear. The osseous skull is intact.  IMPRESSION: 1. Stable atrophy and diffuse white matter disease. 2. Remote lacunar infarcts of the basal ganglia and cerebellum are stable. 3. Soft tissue swelling/ hematoma in the parietal scalp bilaterally, left greater than right, without an underlying fracture.   Electronically Signed   By: Gennette Pac   On: 11/26/2012 11:09    MDM   1. Fall at home, initial encounter   2. Scalp laceration, initial encounter    79 year old female with recurrent falls over the last 24 hours. She had her small scalp wound repaired with glue. Her CT head  is negative for any acute abnormality. She is at her normal baseline per family mentally. Her EKG shows a first-degree AV block this is nothing new. She not have any anginal-type symptoms. Family was prepared to take care of patient at home which they preferred, however patient was unable to take any steps after standing up due to dizziness. She has been complaining of dizziness  on further review of his past 24 hours. As she is on Plavix, having increased falls, and unable to ambulate an opposite sides patient home. I feel she needs admission for syncope/near syncope workup. She may need a stroke evaluation as well. Discussed with Dr. Jerral Ralph of [facility] who will accept patient in transfer/admission.    Audree Camel, MD 11/26/12 [phone]

## 2012-11-26 NOTE — ED Notes (Signed)
Notified [facility] of [id number]

## 2012-11-26 NOTE — ED Notes (Addendum)
The patient's wounds has been cleaned. The patient transported to CT. Her gold ear rings were removed and put in a cup and the daughter has it in her hand.

## 2012-11-26 NOTE — Progress Notes (Signed)
11/26/2012 1:22 PM  Two calls have been placed to [facility] ED to receive report.  The first I was accidentally disconnected, the second a message was left with the RN to call me back.  RN was in room with a patient at the time.  Will await report. Theadora Rama

## 2012-11-26 NOTE — Progress Notes (Signed)
11/26/2012 3:14 PM  Report received at 1335.  Pt arrived via [facility]link around 1450.  Pt fully alert and oriented, hard of hearing.  Pt placed on telemetry, is running brady in the 50s.  Dr. Catha Gosselin notified and is aware, no orders received.  Pt is very weak, two people tried to stand her and she could only stand for a few seconds before sitting back down.  Pt has fallen twice in the past few days.  States that she normally uses a walker to ambulate at home, where she lives with her elderly husband.  Pt vitals are stable.  Pt has a large bruise and knot to her right hip from fall, as well as a laceration to each side of her head posteriorly from her falls.  Generalized bruising and some small abrasions noted to her BLE.  Pt has a healed wound to her right buttocks, skin is intact, however is pink but blancheable.  Pt normally wears a diaper at home, came in with one that was soaked through, diaper removed.  Pt is a high fall risk per protocol, red socks, yellow arm band applied and bed alarm turned on.  Pt verbalized understanding of fall risk and necessary precautions.  Pt oriented to room/unit, and was instructed on how to utilize the call bell, to which she verbalized understanding.  Pt daughter arrived later on and is currently at the bedside.  MD paged and is currently writing admission orders.  Will continue to monitor patient. Theadora Rama

## 2012-11-26 NOTE — ED Notes (Signed)
Patient and husband states patient has a two day history of falling.  States yesterday, her husband found her on the floor, this morning she was attempting to sit in a chair and fell.  Denies loc.  Bleeding noted from two areas on posterior head.

## 2012-11-26 NOTE — Progress Notes (Signed)
79 yo hx of cva, htn , ckd, chf-recently diagnosed with UTI-on ?Bactrim-coming with frequent falls, dizziness, and worsening renal function. Accepted to Telemetry, Team 10.

## 2012-11-26 NOTE — H&P (Signed)
[facility] History and Physical  Jean [name] WUJ:[id number] DOB: 1928-06-07 DOA: 11/26/2012  Referring physician: None PCP: Sonda Primes, MD  Specialists: None  Chief Complaint: Frequent falls  HPI: Jean [name] is a 79 y.o. female with history of CVA in the past, coronary artery disease, CHF, hypertension, presented [facility] today after falling several times at home.  Patient states that she tripped over her walker today and had had. Patient does have 2 lacerations on her head as well as a hematoma noted on the right hip. Patient denies any syncopal episodes or feeling dizzy or vertigo prior to the episode. He denies any loss of bladder or bowel control. She denies any chest pain or shortness of breath, dizziness abdominal pain, nausea vomiting diarrhea or constipation. Of note she did present to her primary care office several days ago was found to have a urinary tract infection for which she was given Bactrim. Patient completed a three-day course which ended on 11/25/2012.  She was transferred to [facility] for admission. CT scan of the head was conducted and it showed no acute changes. Patient does have an EKG showing a sinus bradycardia rate 57.   Review of Systems: The patient denies anorexia, fever, weight loss,, vision loss, decreased hearing, hoarseness, chest pain, syncope, dyspnea on exertion, peripheral edema, hemoptysis, abdominal pain, melena, hematochezia, severe indigestion/heartburn, hematuria, incontinence, genital sores, muscle weakness, suspicious skin lesions, transient blindness, difficulty walking, depression, unusual weight change, abnormal bleeding, enlarged lymph nodes, angioedema, and breast masses.  Past Medical History  Diagnosis Date  . Hyperlipidemia   . Hypothyroid     hypo  . CHF (congestive heart failure)   . Mitral regurgitation   . Tricuspid regurgitation   . Anemia   . Arthritis     Osteoarthtistis  . Elevated glucose   .  CVA (cerebral infarction)   . Vitamin D deficiency   . Myocardial infarction   . Chronic kidney failure     stage 3  . CAD (coronary artery disease)     sees Dr. Ricka Burdock last 03/2011  . Hypertension     Dr. Eden Emms cardiologist, Dr. Cheron Schaumann primary doctor   Past Surgical History  Procedure Laterality Date  . Abdominal hysterectomy    . Back surgery    . Coronary stent placement    . Laparotomy  06/23/2011    Procedure: EXPLORATORY LAPAROTOMY;  Surgeon: Barnett Abu, MD;  Location: [facility];  Service: Neurosurgery;  Laterality: Bilateral;  . Esophagogastroduodenoscopy N/A 04/28/2012    Procedure: ESOPHAGOGASTRODUODENOSCOPY (EGD);  Surgeon: Hart Carwin, MD;  Location: [facility];  Service: Endoscopy;  Laterality: N/A;   Social History:  reports that she has never smoked. She has never used smokeless tobacco. She reports that she does not drink alcohol or use illicit drugs. Lives at home with her husband.  Uses a walker for ambulation.  Allergies  Allergen Reactions  . Ace Inhibitors     unknown  . Aricept [Donepezil Hcl]     Sick   . Ezetimibe-Simvastatin     REACTION: Myalgia  . Methyldopa     tired  . Rosuvastatin     REACTION: aches  . Penicillins Rash    Family History  Problem Relation Age of Onset  . Aneurysm Mother   . Heart disease Mother   . Heart attack Father   . Hypertension Father   . Anesthesia problems Neg Hx   . Colon cancer Neg Hx   .  Esophageal cancer Neg Hx     Prior to Admission medications   Medication Sig Start Date End Date Taking? Authorizing Provider  amLODipine (NORVASC) 5 MG tablet Take 1 tablet (5 mg total) by mouth daily. 08/12/12  Yes Georgina Quint Plotnikov, MD  aspirin 325 MG tablet Take 325 mg by mouth daily.    Yes Historical Provider, MD  cholecalciferol (VITAMIN D) 1000 UNITS tablet Take 1,000 Units by mouth daily.   Yes Tresa Garter, MD  clopidogrel (PLAVIX) 75 MG tablet Take 1 tablet (75 mg total) by mouth daily.  04/29/12  Yes Tresa Garter, MD  ergocalciferol (VITAMIN D2) 50000 UNITS capsule Take 1 capsule (50,000 Units total) by mouth once a week. 04/28/12  Yes Hart Carwin, MD  FLUoxetine (PROZAC) 10 MG tablet Take 1 tablet (10 mg total) by mouth daily. 08/12/12  Yes Georgina Quint Plotnikov, MD  levothyroxine (SYNTHROID, LEVOTHROID) 75 MCG tablet Take 1 tablet (75 mcg total) by mouth daily. 04/29/12  Yes Georgina Quint Plotnikov, MD  losartan (COZAAR) 100 MG tablet Take 1 tablet (100 mg total) by mouth daily. 11/17/12  Yes Georgina Quint Plotnikov, MD  meclizine (ANTIVERT) 25 MG tablet Take 1 tablet (25 mg total) by mouth 3 (three) times daily as needed. 04/28/12  Yes Hart Carwin, MD  metoprolol (LOPRESSOR) 100 MG tablet Take 1.5 tablets (150 mg total) by mouth 2 (two) times daily. 05/18/12  Yes Georgina Quint Plotnikov, MD  omeprazole (PRILOSEC) 20 MG capsule Take 1 capsule (20 mg total) by mouth daily. 08/12/12  Yes Georgina Quint Plotnikov, MD  pravastatin (PRAVACHOL) 20 MG tablet Take 1 tablet (20 mg total) by mouth 2 (two) times daily. 05/13/12  Yes Georgina Quint Plotnikov, MD  vitamin B-12 (CYANOCOBALAMIN) 100 MCG tablet Take 100 mcg by mouth daily.   Yes Historical Provider, MD  Coenzyme Q10 (CO Q 10 PO) Take 1 tablet by mouth daily.     Historical Provider, MD  nitroGLYCERIN (NITROSTAT) 0.4 MG SL tablet Place 0.4 mg under the tongue every 5 (five) minutes as needed. For chest pain    Historical Provider, MD   Physical Exam: Filed Vitals:   11/26/12 1446  BP: 182/60  Pulse: 88  Temp: 98.7 F (37.1 C)  Resp:      General:  Well-developed, well-nourished, In no apparent distress   HEENT: 2 lacerations noted on patient's scalp. Normocephalic. PERRLA  Neck: Supple, no JVD, no lymphadenopathy palpated   Cardiovascular: Bradycardic. +S1/S2, no murmurs appreciated.    Respiratory: Clear to auscultation bilaterally, no wheezing  Abdomen: Soft, nontender, nondistended, positive bowel sounds noted, no rebound or  guarding   Skin: Dry, warm,  Intact, hematoma noted on the right hip, bruise on right LE  Musculoskeletal: No clubbing or cyanosis. Pulses intact. Trace lower extremity edema.   Psychiatric: Appropriate mood and affect.   Neurologic: Alert and oriented x3. Cranial nerves II through XII intact as tested. No sensory or motor deficits noted   Labs on Admission:  Basic Metabolic Panel:  Recent Labs Lab 11/26/12 1000  NA 138  K 4.8  CL 104  CO2 23  GLUCOSE 105*  BUN 27*  CREATININE 2.00*  CALCIUM 10.2   Liver Function Tests: No results found for this basename: AST, ALT, ALKPHOS, BILITOT, PROT, ALBUMIN,  in the last 168 hours No results found for this basename: LIPASE, AMYLASE,  in the last 168 hours No results found for this basename: AMMONIA,  in the last 168 hours CBC:  Recent Labs Lab 11/26/12 1000  WBC 7.6  HGB 11.4*  HCT 34.4*  MCV 89.6  PLT 214   Cardiac Enzymes: No results found for this basename: CKTOTAL, CKMB, CKMBINDEX, TROPONINI,  in the last 168 hours  BNP (last 3 results) No results found for this basename: PROBNP,  in the last 8760 hours CBG: No results found for this basename: GLUCAP,  in the last 168 hours  Radiological Exams on Admission: Ct Head Wo Contrast  11/26/2012   CLINICAL DATA:  Fall. Head injury. The patient takes Plavix.  EXAM: CT HEAD WITHOUT CONTRAST  TECHNIQUE: Contiguous axial images were obtained from the base of the skull through the vertex without intravenous contrast.  COMPARISON:  CT head without contrast 02/05/2012 by our health care.  FINDINGS: No acute intracranial abnormality is present. No acute cortical infarct hemorrhage, or mass lesion is present. Atrophy and extensive white matter disease is similar to prior exam. Remote lacunar infarcts of the basal ganglia and cerebellum are stable. A left PICA territory infarct is stable. The ventricles are proportionate to the degree of atrophy. No significant extra-axial fluid collection  is present.  A left parietal scalp hematoma is present. A small right parietal scalp hematoma is noted as well. There is no underlying fracture. The paranasal sinuses and mastoid air cells are clear. The osseous skull is intact.  IMPRESSION: 1. Stable atrophy and diffuse white matter disease. 2. Remote lacunar infarcts of the basal ganglia and cerebellum are stable. 3. Soft tissue swelling/ hematoma in the parietal scalp bilaterally, left greater than right, without an underlying fracture.   Electronically Signed   By: Gennette Pac   On: 11/26/2012 11:09    EKG: Independently reviewed. Sinus bradycardia, rate 57, Left axis deviation, first degree AV block  Assessment/Plan 1. Ambulatory dysfunction  -Will admit the patient to the medical telemetry floor. We will consult PT and OT for evaluation and treatment. Will order urinary analysis with culture. Will also blood cultures.  Patient has had gait instability and his father personally 3 times. At this time there does not seem to be infectious etiology. CT of the head has been negative. Patient has had no neurological deficits at this time. Will conduct neuro checks as well as place patient on fall precautions.  2. Acute on chronic renal insufficiency  -May be secondary to Bactrim use. Patient appears to have a baseline creatinine approximately 1.5-1.6. Presents with a creatinine of 2.0.  We'll gently hydrate patient with normal saline and continue to monitor her her creatinine.  3. HYPERLIPIDEMIA  Will hold pravastatin at this time due to simvastatin intolerance.    4. Scalp laceration, stable  5. Recent history of UTI  -Will obtain repeat U/A and culture.    6. Hypertension  -Will continue home medications with holding parameters.  7. Systolic CHF, stable.    8. CAD, no chest pain at this time.  9. Hypothyroidism, continue home meds.   Code Status: Full Family Communication: None Disposition Plan: Admitted  Time spent: 60  minutes  Catha Gosselin [facility] Pager [phone]  If 7PM-7AM, please contact night-coverage [email or web address] Password [facility] 11/26/2012, 3:43 PM

## 2012-11-26 NOTE — ED Notes (Signed)
Patient transported to CT 

## 2012-11-26 NOTE — ED Notes (Signed)
Suture cart is at the bedside set up and ready for the doctor to use. 

## 2012-11-27 DIAGNOSIS — Y92009 Unspecified place in unspecified non-institutional (private) residence as the place of occurrence of the external cause: Secondary | ICD-10-CM

## 2012-11-27 DIAGNOSIS — W19XXXA Unspecified fall, initial encounter: Secondary | ICD-10-CM

## 2012-11-27 DIAGNOSIS — N179 Acute kidney failure, unspecified: Secondary | ICD-10-CM

## 2012-11-27 DIAGNOSIS — N390 Urinary tract infection, site not specified: Secondary | ICD-10-CM

## 2012-11-27 LAB — COMPREHENSIVE METABOLIC PANEL
AST: 20 U/L (ref 0–37)
Albumin: 3.4 g/dL — ABNORMAL LOW (ref 3.5–5.2)
BUN: 23 mg/dL (ref 6–23)
CO2: 21 mEq/L (ref 19–32)
Calcium: 9.2 mg/dL (ref 8.4–10.5)
Chloride: 103 mEq/L (ref 96–112)
Creatinine, Ser: 1.82 mg/dL — ABNORMAL HIGH (ref 0.50–1.10)
GFR calc Af Amer: 29 mL/min — ABNORMAL LOW (ref >90)
GFR calc non Af Amer: 25 mL/min — ABNORMAL LOW (ref >90)
Glucose, Bld: 91 mg/dL (ref 70–99)
Total Protein: 6.8 g/dL (ref 6.0–8.3)

## 2012-11-27 LAB — URINALYSIS, ROUTINE W REFLEX MICROSCOPIC
Bilirubin Urine: NEGATIVE
Nitrite: NEGATIVE
Protein, ur: 30 mg/dL — AB
Urobilinogen, UA: 1 mg/dL (ref 0.0–1.0)

## 2012-11-27 LAB — URINE MICROSCOPIC-ADD ON

## 2012-11-27 LAB — CBC
Hemoglobin: 11.2 g/dL — ABNORMAL LOW (ref 12.0–15.0)
MCV: 87.2 fL (ref 78.0–100.0)
RBC: 3.74 MIL/uL — ABNORMAL LOW (ref 3.87–5.11)
RDW: 13.2 % (ref 11.5–15.5)
WBC: 6 10*3/uL (ref 4.0–10.5)

## 2012-11-27 MED ORDER — ASPIRIN EC 81 MG PO TBEC
81.0000 mg | DELAYED_RELEASE_TABLET | Freq: Every day | ORAL | Status: DC
  Administered 2012-11-28 – 2012-11-30 (×3): 81 mg via ORAL
  Filled 2012-11-27 (×3): qty 1

## 2012-11-27 MED ORDER — LEVOFLOXACIN IN D5W 750 MG/150ML IV SOLN
750.0000 mg | INTRAVENOUS | Status: DC
  Administered 2012-11-27: 15:00:00 750 mg via INTRAVENOUS
  Filled 2012-11-27: qty 150

## 2012-11-27 NOTE — Evaluation (Signed)
Physical Therapy Evaluation Patient Details Name: Jean [name] MRN: [id number] DOB: [birth year] Today's Date: 11/27/2012 Time: 9562-1308 PT Time Calculation (min): 32 min  PT Assessment / Plan / Recommendation History of Present Illness  Patient is an 79 yo female admitted with frequent falls (bruising to rt hip and posterior head), and recent UTI.  Noted sinus brady in 50's.  Clinical Impression  Patient presents with problems listed below.  Patient and husband report patient with significant decrease in strength and mobility resulting in falls.  Will benefit from acute PT to maximize independence prior to discharge.  Recommend SNF at discharge for continued therapy prior to return home.    PT Assessment  Patient needs continued PT services    Follow Up Recommendations  SNF    Does the patient have the potential to tolerate intense rehabilitation      Barriers to Discharge Decreased caregiver support Do not feel husband is able to provide assist patient needs.    Equipment Recommendations  None recommended by PT    Recommendations for Other Services     Frequency Min 3X/week    Precautions / Restrictions Precautions Precautions: Fall Precaution Comments: Patient and husband report significant decrease in strength and function. Restrictions Weight Bearing Restrictions: No   Pertinent Vitals/Pain       Mobility  Bed Mobility Bed Mobility: Rolling Left;Left Sidelying to Sit;Sitting - Scoot to [employer] of Bed;Sit to Supine Rolling Left: 4: Min assist;With rail Left Sidelying to Sit: 4: Min assist;With rails;HOB flat Sitting - Scoot to [employer] of Bed: 4: Min assist;With rail Sit to Supine: 3: Mod assist;HOB flat Details for Bed Mobility Assistance: Verbal and tactile cues for technique for mobility.  Cues to reach for rail with RUE for rolling.  Assist to move LE's off of bed and raise trunk to sitting.  Able to sit EOB x 8 minutes with fair balance.  Assist to lower trunk and  raise bil. LE's onto bed to return to supine. Transfers Transfers: Sit to Stand;Stand to Sit Sit to Stand: 3: Mod assist;With upper extremity assist;From bed Stand to Sit: 3: Mod assist;With upper extremity assist;To bed Details for Transfer Assistance: Verbal cues for hand placement and assist to rise to standing.  Patient able to stand x2 for 3 minutes and 1 minute.  Patient leaning posteriorly, with LE's against bed for support.  Worked on shifting weight forward onto feet and using UE's to assist with support.  Able to take 1 step forward and backward.  Able to take steps in place (5 steps each foot). Able to stand for pericare following bm (patient unaware).  Patient fatigued very quickly and returned to sitting.   Ambulation/Gait Ambulation/Gait Assistance: Not tested (comment) (will require +2 assist for safety)    Exercises     PT Diagnosis: Difficulty walking;Generalized weakness;Altered mental status  PT Problem List: Decreased strength;Decreased activity tolerance;Decreased balance;Decreased mobility;Decreased cognition;Decreased knowledge of use of DME PT Treatment Interventions: DME instruction;Gait training;Functional mobility training;Balance training;Patient/family education;Cognitive remediation     PT Goals(Current goals can be found in the care plan section) Acute Rehab PT Goals Patient Stated Goal: To get stronger PT Goal Formulation: With patient/family Time For Goal Achievement: 12/11/12 Potential to Achieve Goals: Good  Visit Information  Last PT Received On: 11/27/12 Assistance Needed: +2 History of Present Illness: Patient is an 79 yo female admitted with frequent falls (bruising to rt hip and posterior head), and recent UTI.  Noted sinus brady in 50's.  Prior Functioning  Home Living Family/patient expects to be discharged to:: Skilled nursing facility Living Arrangements: Spouse/significant other Available Help at Discharge: Family;Available 24  hours/day (However, need +2 for mobility) Type of Home: House Home Access: Stairs to enter [employer] of Steps: 2 Entrance Stairs-Rails: Right;Left Home Layout: One level Home Equipment: Walker - 2 wheels;Cane - single point;Wheelchair - [employer];Tub bench Prior Function Level of Independence: Needs assistance Gait / Transfers Assistance Needed: Uses RW - husband assists patient for safety ADL's / Homemaking Assistance Needed: Assist for bathing, meal prep, and housekeeping Comments: Recent significant decline with mobility/strength resulting in multiple falls.  Today patient incontinent of bowel and bladder - unaware. Communication Communication: HOH    Cognition  Cognition Arousal/Alertness: Lethargic Behavior During Therapy: WFL for tasks assessed/performed Overall Cognitive Status: Impaired/Different from baseline Area of Impairment: Orientation;Problem solving Orientation Level: Disoriented to;Situation;Time Memory: Decreased short-term memory Problem Solving: Slow processing;Decreased initiation;Difficulty sequencing;Requires verbal cues    Extremity/Trunk Assessment Upper Extremity Assessment Upper Extremity Assessment: Generalized weakness Lower Extremity Assessment Lower Extremity Assessment: RLE deficits/detail;LLE deficits/detail RLE Deficits / Details: Strength at most 3/5 - difficulty lifting LE against gravity RLE Coordination: decreased gross motor LLE Deficits / Details: Strength at most 3/5 - difficulty lifting LE against gravity LLE Coordination: decreased gross motor   Balance Balance Balance Assessed: Yes Static Sitting Balance Static Sitting - Balance Support: Bilateral upper extremity supported;Feet supported Static Sitting - Level of Assistance: 5: Stand by assistance Static Sitting - Comment/# of Minutes: 8 minutes.  Initially with posterior lean.  Able to shift weight forward and maintain balance. Static Standing  Balance Static Standing - Balance Support: Bilateral upper extremity supported Static Standing - Level of Assistance: 4: Min assist Static Standing - Comment/# of Minutes: 3 min and 1 min.  Patient with posterior lean requiring assist.  Fatigues quickly.  End of Session PT - End of Session Equipment Utilized During Treatment: Gait belt Activity Tolerance: Patient limited by fatigue;Patient limited by lethargy Patient left: in bed;with call bell/phone within reach;with family/visitor present Nurse Communication: Mobility status  GP     Vena Austria 11/27/2012, 5:41 PM Durenda Hurt. Renaldo Fiddler, [facility] Acute Rehab Services Pager [phone]

## 2012-11-27 NOTE — Progress Notes (Signed)
[facility] PROGRESS NOTE  Jean [name]:[id number] DOB: [birth year] DOA: 11/26/2012 PCP: Sonda Primes, MD  Assessment/Plan: 79 y.o. female with history of CVA in the past, CAD, CHF, hypertension, possible underlying dementia presented to [facility] today after falling several times at home  1. Fall likely mechanical worse with UTI -neuro exam is non focal; CT head: old CVA, brain atrophy; Scalp laceration -cont monitoring; PT/OT eval; treat UTI  2. AKI on CKD III likely bactrim toxicity;  -d/c bactrim; gentle IVF; monitor   3. UTI failed outpatient bactrim;  -IV levofloxacin; f/u c/s;   4. Hypertension  - continue home medications with holding parameters.   5. Systolic CHF, LVEF 45% (08/2011)  clinically stable;   6. CAD, no chest pain at this time. Decrease ASA to 81 due to high risk of bleeding with plavix;   7. Hypothyroidism, continue home meds.  8. MCI likely underlying dementia; monitor   Code Status: full  Family Communication: husband at the bedside  (indicate person spoken with, relationship, and if by phone, the number) Disposition Plan: pend PT eval    Consultants:  No   Procedures:  CT head   Antibiotics:  no (indicate start date, and stop date if known)  HPI/Subjective: Alert, oriented   Objective: Filed Vitals:   11/27/12 0919  BP: 146/52  Pulse: 62  Temp: 98.7 F (37.1 C)  Resp: 17    Intake/Output Summary (Last 24 hours) at 11/27/12 1358 Last data filed at 11/27/12 0920  Gross per 24 hour  Intake 1211.66 ml  Output      0 ml  Net 1211.66 ml   Filed Weights   11/26/12 0939 11/26/12 2102  Weight: 72.576 kg (160 lb) 74.1 kg (163 lb 5.8 oz)    Exam:   General:  Alert   Cardiovascular: s1,s2 rrr  Respiratory: cta bl   Abdomen: soft, nt, nd   Musculoskeletal: no LE edema    Data Reviewed: Basic Metabolic Panel:  Recent Labs Lab 11/26/12 1000 11/26/12 1757 11/27/12 0629  NA 138  --  136  K 4.8   --  4.2  CL 104  --  103  CO2 23  --  21  GLUCOSE 105*  --  91  BUN 27*  --  23  CREATININE 2.00* 1.90* 1.82*  CALCIUM 10.2  --  9.2   Liver Function Tests:  Recent Labs Lab 11/27/12 0629  AST 20  ALT 10  ALKPHOS 89  BILITOT 0.4  PROT 6.8  ALBUMIN 3.4*   No results found for this basename: LIPASE, AMYLASE,  in the last 168 hours No results found for this basename: AMMONIA,  in the last 168 hours CBC:  Recent Labs Lab 11/26/12 1000 11/26/12 1757 11/27/12 0629  WBC 7.6 7.0 6.0  HGB 11.4* 11.5* 11.2*  HCT 34.4* 33.7* 32.6*  MCV 89.6 88.2 87.2  PLT 214 222 193   Cardiac Enzymes:  Recent Labs Lab 11/26/12 1757  TROPONINI <0.30   BNP (last 3 results) No results found for this basename: PROBNP,  in the last 8760 hours CBG: No results found for this basename: GLUCAP,  in the last 168 hours  Recent Results (from the past 240 hour(s))  URINE CULTURE     Status: None   Collection Time    11/19/12 10:23 AM      Result Value Range Status   Culture ESCHERICHIA COLI   Final   Colony Count >=100,000 COLONIES/ML   Final  Organism ID, Bacteria ESCHERICHIA COLI   Final     Studies: Ct Head Wo Contrast  11/26/2012   CLINICAL DATA:  Fall. Head injury. The patient takes Plavix.  EXAM: CT HEAD WITHOUT CONTRAST  TECHNIQUE: Contiguous axial images were obtained from the base of the skull through the vertex without intravenous contrast.  COMPARISON:  CT head without contrast 02/05/2012 by our health care.  FINDINGS: No acute intracranial abnormality is present. No acute cortical infarct hemorrhage, or mass lesion is present. Atrophy and extensive white matter disease is similar to prior exam. Remote lacunar infarcts of the basal ganglia and cerebellum are stable. A left PICA territory infarct is stable. The ventricles are proportionate to the degree of atrophy. No significant extra-axial fluid collection is present.  A left parietal scalp hematoma is present. A small right parietal  scalp hematoma is noted as well. There is no underlying fracture. The paranasal sinuses and mastoid air cells are clear. The osseous skull is intact.  IMPRESSION: 1. Stable atrophy and diffuse white matter disease. 2. Remote lacunar infarcts of the basal ganglia and cerebellum are stable. 3. Soft tissue swelling/ hematoma in the parietal scalp bilaterally, left greater than right, without an underlying fracture.   Electronically Signed   By: Gennette Pac   On: 11/26/2012 11:09    Scheduled Meds: . amLODipine  5 mg Oral Daily  . aspirin  325 mg Oral Daily  . cholecalciferol  1,000 Units Oral Daily  . clopidogrel  75 mg Oral Daily  . docusate sodium  100 mg Oral BID  . FLUoxetine  10 mg Oral Daily  . heparin  5,000 Units Subcutaneous Q8H  . levothyroxine  75 mcg Oral QAC breakfast  . losartan  100 mg Oral Daily  . metoprolol  150 mg Oral BID  . pantoprazole  40 mg Oral Daily  . senna  1 tablet Oral BID  . sodium chloride  3 mL Intravenous Q12H  . vitamin B-12  100 mcg Oral Daily   Continuous Infusions:   Principal Problem:   Ambulatory dysfunction Active Problems:   HYPOTHYROIDISM, UNSPECIFIED   HYPERLIPIDEMIA   HYPERTENSION   CORONARY ARTERY DISEASE   RENAL INSUFFICIENCY   ARF (acute renal failure)   Falls frequently   Scalp laceration   Fall at home   Systolic CHF    Time spent: > 35 minutes     [name]  [facility] Pager [phone]. If 7PM-7AM, please contact night-coverage at [email or web address], password [facility] 11/27/2012, 1:58 PM  LOS: 1 day

## 2012-11-28 DIAGNOSIS — F29 Unspecified psychosis not due to a substance or known physiological condition: Secondary | ICD-10-CM

## 2012-11-28 LAB — URINE CULTURE: Colony Count: 1000

## 2012-11-28 LAB — BASIC METABOLIC PANEL
CO2: 20 mEq/L (ref 19–32)
Chloride: 104 mEq/L (ref 96–112)
GFR calc Af Amer: 28 mL/min — ABNORMAL LOW (ref >90)
Potassium: 4.3 mEq/L (ref 3.5–5.1)
Sodium: 137 mEq/L (ref 135–145)

## 2012-11-28 MED ORDER — LEVOFLOXACIN 750 MG PO TABS
750.0000 mg | ORAL_TABLET | ORAL | Status: DC
Start: 2012-11-29 — End: 2012-11-30
  Administered 2012-11-29: 14:00:00 750 mg via ORAL
  Filled 2012-11-28: qty 1

## 2012-11-28 MED ORDER — SODIUM CHLORIDE 0.9 % IV SOLN: INTRAVENOUS | Status: DC

## 2012-11-28 NOTE — Progress Notes (Signed)
[facility] PROGRESS NOTE  Jean [name] QIO:[id number] DOB: 08/23/1928 DOA: 11/26/2012 PCP: Sonda Primes, MD  Assessment/Plan: 79 y.o. female with history of CVA in the past, CAD, CHF, hypertension, possible underlying dementia presented to [facility] today after falling several times at home  1. Fall likely mechanical worse with UTI -neuro exam is non focal; CT head: old CVA, brain atrophy; Scalp laceration -cont monitoring; PT/OT eval : SNF; treat UTI  2. AKI on CKD III likely bactrim toxicity;  -d/c bactrim; gentle IVF; monitor   3. UTI failed outpatient bactrim;  -IV levofloxacin; f/u urine c/s;   4. Hypertension  - continue home medications with holding parameters.   5. Systolic CHF, LVEF 45% (08/2011)  clinically stable;   6. CAD, no chest pain at this time. Decrease ASA to 81 due to high risk of bleeding with plavix;   7. Hypothyroidism, continue home meds.  8. MCI likely underlying dementia; monitor   Code Status: full  Family Communication: husband at the bedside  (indicate person spoken with, relationship, and if by phone, the number) Disposition Plan: SNF;    Consultants:  No   Procedures:  CT head   Antibiotics:  no (indicate start date, and stop date if known)  HPI/Subjective: Alert, oriented   Objective: Filed Vitals:   11/28/12 0912  BP: 150/68  Pulse: 72  Temp: 99.1 F (37.3 C)  Resp: 17    Intake/Output Summary (Last 24 hours) at 11/28/12 0959 Last data filed at 11/28/12 0912  Gross per 24 hour  Intake    420 ml  Output      0 ml  Net    420 ml   Filed Weights   11/26/12 0939 11/26/12 2102 11/27/12 2108  Weight: 72.576 kg (160 lb) 74.1 kg (163 lb 5.8 oz) 73.5 kg (162 lb 0.6 oz)    Exam:   General:  Alert   Cardiovascular: s1,s2 rrr  Respiratory: cta bl   Abdomen: soft, nt, nd   Musculoskeletal: no LE edema    Data Reviewed: Basic Metabolic Panel:  Recent Labs Lab 11/26/12 1000 11/26/12 1757  11/27/12 0629 11/28/12 0533  NA 138  --  136 137  K 4.8  --  4.2 4.3  CL 104  --  103 104  CO2 23  --  21 20  GLUCOSE 105*  --  91 99  BUN 27*  --  23 28*  CREATININE 2.00* 1.90* 1.82* 1.85*  CALCIUM 10.2  --  9.2 9.2   Liver Function Tests:  Recent Labs Lab 11/27/12 0629  AST 20  ALT 10  ALKPHOS 89  BILITOT 0.4  PROT 6.8  ALBUMIN 3.4*   No results found for this basename: LIPASE, AMYLASE,  in the last 168 hours No results found for this basename: AMMONIA,  in the last 168 hours CBC:  Recent Labs Lab 11/26/12 1000 11/26/12 1757 11/27/12 0629  WBC 7.6 7.0 6.0  HGB 11.4* 11.5* 11.2*  HCT 34.4* 33.7* 32.6*  MCV 89.6 88.2 87.2  PLT 214 222 193   Cardiac Enzymes:  Recent Labs Lab 11/26/12 1757  TROPONINI <0.30   BNP (last 3 results) No results found for this basename: PROBNP,  in the last 8760 hours CBG: No results found for this basename: GLUCAP,  in the last 168 hours  Recent Results (from the past 240 hour(s))  URINE CULTURE     Status: None   Collection Time    11/19/12 10:23 AM  Result Value Range Status   Culture ESCHERICHIA COLI   Final   Colony Count >=100,000 COLONIES/ML   Final   Organism ID, Bacteria ESCHERICHIA COLI   Final     Studies: Ct Head Wo Contrast  11/26/2012   CLINICAL DATA:  Fall. Head injury. The patient takes Plavix.  EXAM: CT HEAD WITHOUT CONTRAST  TECHNIQUE: Contiguous axial images were obtained from the base of the skull through the vertex without intravenous contrast.  COMPARISON:  CT head without contrast 02/05/2012 by our health care.  FINDINGS: No acute intracranial abnormality is present. No acute cortical infarct hemorrhage, or mass lesion is present. Atrophy and extensive white matter disease is similar to prior exam. Remote lacunar infarcts of the basal ganglia and cerebellum are stable. A left PICA territory infarct is stable. The ventricles are proportionate to the degree of atrophy. No significant extra-axial fluid  collection is present.  A left parietal scalp hematoma is present. A small right parietal scalp hematoma is noted as well. There is no underlying fracture. The paranasal sinuses and mastoid air cells are clear. The osseous skull is intact.  IMPRESSION: 1. Stable atrophy and diffuse white matter disease. 2. Remote lacunar infarcts of the basal ganglia and cerebellum are stable. 3. Soft tissue swelling/ hematoma in the parietal scalp bilaterally, left greater than right, without an underlying fracture.   Electronically Signed   By: Gennette Pac   On: 11/26/2012 11:09    Scheduled Meds: . amLODipine  5 mg Oral Daily  . aspirin EC  81 mg Oral Daily  . cholecalciferol  1,000 Units Oral Daily  . clopidogrel  75 mg Oral Daily  . docusate sodium  100 mg Oral BID  . FLUoxetine  10 mg Oral Daily  . heparin  5,000 Units Subcutaneous Q8H  . levofloxacin (LEVAQUIN) IV  750 mg Intravenous Q48H  . levothyroxine  75 mcg Oral QAC breakfast  . losartan  100 mg Oral Daily  . metoprolol  150 mg Oral BID  . pantoprazole  40 mg Oral Daily  . senna  1 tablet Oral BID  . sodium chloride  3 mL Intravenous Q12H  . vitamin B-12  100 mcg Oral Daily   Continuous Infusions:   Principal Problem:   Ambulatory dysfunction Active Problems:   HYPOTHYROIDISM, UNSPECIFIED   HYPERLIPIDEMIA   HYPERTENSION   CORONARY ARTERY DISEASE   RENAL INSUFFICIENCY   ARF (acute renal failure)   Falls frequently   Scalp laceration   Fall at home   Systolic CHF    Time spent: > 35 minutes     [name]  [facility] Pager [phone]. If 7PM-7AM, please contact night-coverage at [email or web address], password [facility] 11/28/2012, 9:59 AM  LOS: 2 days

## 2012-11-29 LAB — BASIC METABOLIC PANEL
CO2: 15 mEq/L — ABNORMAL LOW (ref 19–32)
Chloride: 102 mEq/L (ref 96–112)
Creatinine, Ser: 1.67 mg/dL — ABNORMAL HIGH (ref 0.50–1.10)
Glucose, Bld: 97 mg/dL (ref 70–99)
Potassium: 4.2 mEq/L (ref 3.5–5.1)
Sodium: 134 mEq/L — ABNORMAL LOW (ref 135–145)

## 2012-11-29 MED ORDER — LEVOFLOXACIN 750 MG PO TABS: 750.0000 mg | ORAL_TABLET | ORAL | Status: DC

## 2012-11-29 MED ORDER — ASPIRIN 81 MG PO TBEC: 81.0000 mg | DELAYED_RELEASE_TABLET | Freq: Every day | ORAL | Status: DC

## 2012-11-29 NOTE — Clinical Social Work Note (Signed)
Patient assessed and faxed out for short-term rehab. Full assessment to follow.  Genelle Bal, MSW, LCSW [phone]

## 2012-11-29 NOTE — Evaluation (Signed)
Occupational Therapy Evaluation Patient Details Name: Jean [name] MRN: [id number] DOB: 20-Mar-1928 Today's Date: 11/29/2012 Time: 8413-2440 OT Time Calculation (min): 37 min  OT Assessment / Plan / Recommendation History of present illness Patient is an 79 yo female admitted with frequent falls (bruising to rt hip and posterior head), and recent UTI.  Noted sinus brady in 50's.   Clinical Impression   Pt presents with impaired cognition, balance, and mobility interfering with ability to perform ADL.  Pt with urinary incontinence requiring change of socks and gown.  Will follow acutely.  Will need SNF for ST rehab upon d/c.    OT Assessment  Patient needs continued OT Services    Follow Up Recommendations  SNF    Barriers to Discharge      Equipment Recommendations  None recommended by OT    Recommendations for Other Services    Frequency  Min 2X/week    Precautions / Restrictions Precautions Precautions: Fall Precaution Comments: Patient and husband report significant decrease in strength and function. Restrictions Weight Bearing Restrictions: No   Pertinent Vitals/Pain VSS, no pain reported    ADL  Eating/Feeding: Set up Where Assessed - Eating/Feeding: Chair Grooming: Wash/dry hands;Set up Where Assessed - Grooming: Supported sitting Upper Body Bathing: Minimal assistance Where Assessed - Upper Body Bathing: Unsupported sitting Lower Body Bathing: +1 Total assistance Where Assessed - Lower Body Bathing: Unsupported sitting;Supported sit to stand Upper Body Dressing: Minimal assistance Where Assessed - Upper Body Dressing: Unsupported sitting Lower Body Dressing: +1 Total assistance Where Assessed - Lower Body Dressing: Unsupported sitting;Supported sit to stand Toilet Transfer: Moderate assistance Toilet Transfer Method: Sit to stand Toilet Transfer Equipment: Bedside commode Toileting - Clothing Manipulation and Hygiene: +1 Total assistance Where Assessed -  Toileting Clothing Manipulation and Hygiene: Sit to stand from 3-in-1 or toilet Equipment Used: Gait belt;Rolling walker Transfers/Ambulation Related to ADLs: verbal cues for hand placement, assist to shift weight over feet, attempts to sit down prematurely    OT Diagnosis: Generalized weakness;Cognitive deficits  OT Problem List: Decreased strength;Decreased activity tolerance;Impaired balance (sitting and/or standing);Decreased cognition;Decreased knowledge of use of DME or AE;Impaired UE functional use OT Treatment Interventions: Self-care/ADL training;DME and/or AE instruction;Patient/family education;Balance training   OT Goals(Current goals can be found in the care plan section) Acute Rehab OT Goals Patient Stated Goal: To get stronger OT Goal Formulation: With patient Time For Goal Achievement: 12/13/12 Potential to Achieve Goals: Good ADL Goals Pt Will Perform Grooming: with min assist;standing (2 activities) Pt Will Transfer to Toilet: with min guard assist;bedside commode Pt Will Perform Toileting - Clothing Manipulation and hygiene: with min assist;sit to/from stand  Visit Information  Last OT Received On: 11/29/12 Assistance Needed: +1 (for transfers) History of Present Illness: Patient is an 79 yo female admitted with frequent falls (bruising to rt hip and posterior head), and recent UTI.  Noted sinus brady in 50's.       Prior Functioning     Home Living Family/patient expects to be discharged to:: Skilled nursing facility Living Arrangements: Spouse/significant other Available Help at Discharge: Family;Available 24 hours/day Type of Home: House Home Access: Stairs to enter [employer] of Steps: 2 Entrance Stairs-Rails: Right;Left Home Layout: One level Home Equipment: Walker - 2 wheels;Cane - single point;Wheelchair - [employer];Tub bench Prior Function Level of Independence: Needs assistance Gait / Transfers Assistance Needed: Uses RW -  husband assists patient for safety ADL's / Homemaking Assistance Needed: Assist for bathing, meal prep, and housekeeping Comments: Recent significant  decline with mobility/strength resulting in multiple falls.  Today patient incontinent of bowel and bladder - unaware. Communication Communication: HOH Dominant Hand: Right         Vision/Perception Vision - History Baseline Vision: Wears glasses all the time Patient Visual Report: No change from baseline   Cognition  Cognition Arousal/Alertness: Awake/alert Behavior During Therapy: Flat affect Overall Cognitive Status: Impaired/Different from baseline Area of Impairment: Orientation;Problem solving Orientation Level: Disoriented to;Situation;Time Memory: Decreased short-term memory Problem Solving: Slow processing;Decreased initiation;Difficulty sequencing;Requires verbal cues    Extremity/Trunk Assessment Upper Extremity Assessment Upper Extremity Assessment: Generalized weakness (arthritic changes in hands) Lower Extremity Assessment Lower Extremity Assessment: Defer to PT evaluation     Mobility Bed Mobility Bed Mobility: Rolling Right;Right Sidelying to Sit;Sitting - Scoot to [employer] of Bed Rolling Right: 6: Modified independent (Device/Increase time) Right Sidelying to Sit: 4: Min assist Sitting - Scoot to Edge of Bed: 3: Mod assist Details for Bed Mobility Assistance: used bed rails, assisted with pad to EOB Transfers Transfers: Sit to Stand;Stand to Sit Sit to Stand: 3: Mod assist;With upper extremity assist;From bed;From chair/3-in-1 Stand to Sit: 3: Mod assist;With upper extremity assist;To chair/3-in-1 Details for Transfer Assistance: verbal cues for hand placement on walker and to prevent sitting prematurely, assist to control descent     Exercise     Balance Balance Balance Assessed: Yes Static Sitting Balance Static Sitting - Balance Support: Bilateral upper extremity supported;Feet supported Static Sitting  - Level of Assistance: 5: Stand by assistance Static Standing Balance Static Standing - Balance Support: Bilateral upper extremity supported Static Standing - Level of Assistance: 4: Min assist   End of Session OT - End of Session Activity Tolerance: Patient tolerated treatment well Patient left: in chair;with call bell/phone within reach;with chair alarm set Nurse Communication: Mobility status  GO     Evern Bio 11/29/2012, 1:03 PM [phone]

## 2012-11-29 NOTE — Discharge Summary (Signed)
Physician Discharge Summary  Jean [name] ZOX:[id number] DOB: 04/13/1928 DOA: 11/26/2012  PCP: Sonda Primes, MD  Admit date: 11/26/2012 Discharge date: 11/29/2012  Time spent: > 35 minutes  minutes  Recommendations for Outpatient Follow-up:   F/u with PCP in 2 weeks post discharge   Discharge Diagnoses:  Principal Problem:   Ambulatory dysfunction Active Problems:   HYPOTHYROIDISM, UNSPECIFIED   HYPERLIPIDEMIA   HYPERTENSION   CORONARY ARTERY DISEASE   RENAL INSUFFICIENCY   ARF (acute renal failure)   Falls frequently   Scalp laceration   Fall at home   Systolic CHF   Discharge Condition: stable   Diet recommendation: heart healthy   Filed Weights   11/26/12 2102 11/27/12 2108 11/28/12 2052  Weight: 74.1 kg (163 lb 5.8 oz) 73.5 kg (162 lb 0.6 oz) 71.5 kg (157 lb 10.1 oz)    History of present illness:  79 y.o. female with history of CVA in the past, CAD, CHF, hypertension, possible underlying dementia presented to [facility] today after falling several times at home   Hospital Course:  1. Fall likely mechanical worse with UTI  -neuro exam is non focal; CT head: old CVA, brain atrophy; Scalp laceration  -PT/OT : SNF;  2. AKI on CKD III likely bactrim toxicity;  -d/c bactrim; gentle IVF; renal function improving  3. UTI failed outpatient bactrim; previous urine c/s E coli; repeat urine c/s no significant growth; improved on levofloxacin, to complete outpatient dose  4. Hypertension  - continue home medications; hold ARB due to AKI   5. Systolic CHF, LVEF 45% (08/2011) clinically stable; not on diuretics; ARB on hold due to AKI  6. CAD, no chest pain at this time. Decrease ASA to 81 due to high risk of bleeding with plavix; 7. Hypothyroidism, continue home meds.  8. MCI likely underlying dementia;     Procedures:  CT head (i.e. Studies not automatically included, echos, thoracentesis, etc; not x-rays)  Consultations:  None   Discharge  Exam: Filed Vitals:   11/29/12 0431  BP: 144/85  Pulse: 60  Temp: 97.6 F (36.4 C)  Resp: 18    General: alert, oriented  Cardiovascular: s1,s2 rrr Respiratory: cta BL   Discharge Instructions  Discharge Orders   Future Appointments Provider Department Dept Phone   02/16/2013 9:00 AM Tresa Garter, MD [facility] [phone]   Future Orders Complete By Expires   Diet - low sodium heart healthy  As directed    Discharge instructions  As directed    Comments:     Follow up with primary care doctor in 2 weeks   Increase activity slowly  As directed        Medication List    STOP taking these medications       aspirin 325 MG tablet  Replaced by:  aspirin 81 MG EC tablet     losartan 100 MG tablet  Commonly known as:  COZAAR     sulfamethoxazole-trimethoprim 800-160 MG per tablet  Commonly known as:  SEPTRA DS      TAKE these medications       amLODipine 5 MG tablet  Commonly known as:  NORVASC  Take 1 tablet (5 mg total) by mouth daily.     aspirin 81 MG EC tablet  Take 1 tablet (81 mg total) by mouth daily.     cholecalciferol 1000 UNITS tablet  Commonly known as:  VITAMIN D  Take 1,000 Units by mouth daily.  clopidogrel 75 MG tablet  Commonly known as:  PLAVIX  Take 1 tablet (75 mg total) by mouth daily.     CO Q 10 PO  Take 1 tablet by mouth daily.     ergocalciferol 50000 UNITS capsule  Commonly known as:  VITAMIN D2  Take 1 capsule (50,000 Units total) by mouth once a week.     FLUoxetine 10 MG tablet  Commonly known as:  PROZAC  Take 1 tablet (10 mg total) by mouth daily.     levofloxacin 750 MG tablet  Commonly known as:  LEVAQUIN  Take 1 tablet (750 mg total) by mouth every other day.     levothyroxine 75 MCG tablet  Commonly known as:  SYNTHROID, LEVOTHROID  Take 1 tablet (75 mcg total) by mouth daily.     meclizine 25 MG tablet  Commonly known as:  ANTIVERT  Take 1 tablet (25 mg total) by mouth 3  (three) times daily as needed.     metoprolol 100 MG tablet  Commonly known as:  LOPRESSOR  Take 1.5 tablets (150 mg total) by mouth 2 (two) times daily.     nitroGLYCERIN 0.4 MG SL tablet  Commonly known as:  NITROSTAT  Place 0.4 mg under the tongue every 5 (five) minutes as needed. For chest pain     omeprazole 20 MG capsule  Commonly known as:  PRILOSEC  Take 1 capsule (20 mg total) by mouth daily.     pravastatin 20 MG tablet  Commonly known as:  PRAVACHOL  Take 1 tablet (20 mg total) by mouth 2 (two) times daily.     vitamin B-12 100 MCG tablet  Commonly known as:  CYANOCOBALAMIN  Take 100 mcg by mouth daily.       Allergies  Allergen Reactions  . Ace Inhibitors     unknown  . Aricept [Donepezil Hcl]     Sick   . Ezetimibe-Simvastatin     REACTION: Myalgia  . Methyldopa     tired  . Rosuvastatin     REACTION: aches  . Penicillins Rash       Follow-up Information   Follow up with Sonda Primes, MD In 2 weeks. (as scheduled, sooner if symptoms worsen)    Specialty:  Internal Medicine   Contact information:   [address] [place] [address] [phone]        The results of significant diagnostics from this hospitalization (including imaging, microbiology, ancillary and laboratory) are listed below for reference.    Significant Diagnostic Studies: Ct Head Wo Contrast  11/26/2012   CLINICAL DATA:  Fall. Head injury. The patient takes Plavix.  EXAM: CT HEAD WITHOUT CONTRAST  TECHNIQUE: Contiguous axial images were obtained from the base of the skull through the vertex without intravenous contrast.  COMPARISON:  CT head without contrast 02/05/2012 by our health care.  FINDINGS: No acute intracranial abnormality is present. No acute cortical infarct hemorrhage, or mass lesion is present. Atrophy and extensive white matter disease is similar to prior exam. Remote lacunar infarcts of the basal ganglia and cerebellum are stable. A  left PICA territory infarct is stable. The ventricles are proportionate to the degree of atrophy. No significant extra-axial fluid collection is present.  A left parietal scalp hematoma is present. A small right parietal scalp hematoma is noted as well. There is no underlying fracture. The paranasal sinuses and mastoid air cells are clear. The osseous skull is intact.  IMPRESSION:  1. Stable atrophy and diffuse white matter disease. 2. Remote lacunar infarcts of the basal ganglia and cerebellum are stable. 3. Soft tissue swelling/ hematoma in the parietal scalp bilaterally, left greater than right, without an underlying fracture.   Electronically Signed   By: Gennette Pac   On: 11/26/2012 11:09    Microbiology: Recent Results (from the past 240 hour(s))  URINE CULTURE     Status: None   Collection Time    11/27/12 12:34 PM      Result Value Range Status   Specimen Description URINE, RANDOM   Final   Special Requests NONE   Final   Culture  Setup Time     Final   Value: 11/27/2012 14:46     Performed at [facility]   Colony Count     Final   Value: 1,000 COLONIES/ML     Performed at [facility]   Culture     Final   Value: INSIGNIFICANT GROWTH     Performed at [facility]   Report Status 11/28/2012 FINAL   Final     Labs: Basic Metabolic Panel:  Recent Labs Lab 11/26/12 1000 11/26/12 1757 11/27/12 0629 11/28/12 0533 11/29/12 0526  NA 138  --  136 137 134*  K 4.8  --  4.2 4.3 4.2  CL 104  --  103 104 102  CO2 23  --  21 20 15*  GLUCOSE 105*  --  91 99 97  BUN 27*  --  23 28* 22  CREATININE 2.00* 1.90* 1.82* 1.85* 1.67*  CALCIUM 10.2  --  9.2 9.2 9.6   Liver Function Tests:  Recent Labs Lab 11/27/12 0629  AST 20  ALT 10  ALKPHOS 89  BILITOT 0.4  PROT 6.8  ALBUMIN 3.4*   No results found for this basename: LIPASE, AMYLASE,  in the last 168 hours No results found for this basename: AMMONIA,  in the last 168 hours CBC:  Recent Labs Lab  11/26/12 1000 11/26/12 1757 11/27/12 0629  WBC 7.6 7.0 6.0  HGB 11.4* 11.5* 11.2*  HCT 34.4* 33.7* 32.6*  MCV 89.6 88.2 87.2  PLT 214 222 193   Cardiac Enzymes:  Recent Labs Lab 11/26/12 1757  TROPONINI <0.30   BNP: BNP (last 3 results) No results found for this basename: PROBNP,  in the last 8760 hours CBG: No results found for this basename: GLUCAP,  in the last 168 hours     Signed:  Jonette Mate N  [facility] 11/29/2012, 11:25 AM

## 2012-11-30 NOTE — Progress Notes (Signed)
Physical Therapy Treatment Patient Details Name: Jean [name] MRN: [id number] DOB: 23-Aug-1928 Today's Date: 11/30/2012 Time: 4098-1191 PT Time Calculation (min): 54 min  PT Assessment / Plan / Recommendation  History of Present Illness Patient is an 79 yo female admitted with frequent falls (bruising to rt hip and posterior head), and recent UTI.  Noted sinus brady in 50's.   PT Comments   Pt progressing with mobility.  Pt incontinent of urine and stool.  Pt also vomiting during session which limited amb distance.  Follow Up Recommendations  SNF     Does the patient have the potential to tolerate intense rehabilitation     Barriers to Discharge        Equipment Recommendations  None recommended by PT    Recommendations for Other Services    Frequency Min 3X/week   Progress towards PT Goals Progress towards PT goals: Progressing toward goals  Plan Current plan remains appropriate    Precautions / Restrictions Precautions Precautions: Fall   Pertinent Vitals/Pain No c/o's of pain.    Mobility  Bed Mobility Right Sidelying to Sit: 3: Mod assist;With rails;HOB elevated Sitting - Scoot to Edge of Bed: 3: Mod assist Details for Bed Mobility Assistance: Assist to bring trunk up. Transfers Transfers: Stand Pivot Transfers Sit to Stand: 3: Mod assist;1: +2 Total assist;With upper extremity assist;From bed;From chair/3-in-1;With armrests Sit to Stand: Patient Percentage: 50% Stand to Sit: 3: Mod assist;With upper extremity assist;To chair/3-in-1;With armrests Stand Pivot Transfers: 1: +2 Total assist Stand Pivot Transfers: Patient Percentage: 40% Details for Transfer Assistance: Verbal/tactile cues for hand placement and technique.  Assist to bring hips up and forward.  Pt with posterior lean with back of calves braced on bed or chair.  Pt with great difficulty making pivotal steps.  Performed sit to stand at least 5 times with level of assist  varying. Ambulation/Gait Ambulation/Gait Assistance: 4: Min assist Ambulation Distance (Feet): 15 Feet (15' x 1, 5' x 1) Assistive device: Rolling walker Ambulation/Gait Assistance Details: Verbal cues to stand more erect and look up. Gait Pattern: Step-through pattern;Decreased step length - right;Decreased step length - left;Shuffle;Trunk flexed Gait velocity: decr General Gait Details: Distance limited when pt began vomiting.    Exercises     PT Diagnosis:    PT Problem List:   PT Treatment Interventions:     PT Goals (current goals can now be found in the care plan section)    Visit Information  Last PT Received On: 11/30/12 Assistance Needed: +2 History of Present Illness: Patient is an 79 yo female admitted with frequent falls (bruising to rt hip and posterior head), and recent UTI.  Noted sinus brady in 50's.    Subjective Data      Cognition  Cognition Arousal/Alertness: Awake/alert Behavior During Therapy: Flat affect Overall Cognitive Status: Impaired/Different from baseline Orientation Level: Disoriented to;Situation;Time Memory: Decreased short-term memory Problem Solving: Slow processing;Decreased initiation;Difficulty sequencing;Requires verbal cues    Balance  Static Standing Balance Static Standing - Balance Support: Bilateral upper extremity supported Static Standing - Level of Assistance: 4: Min assist;3: Mod assist Static Standing - Comment/# of Minutes: Posterior lean. Stood x 4 for 1-3 minutes to be cleaned.  End of Session PT - End of Session Equipment Utilized During Treatment: Gait belt Activity Tolerance: Other (comment) (limited by vomiting.) Patient left: in chair;with call bell/phone within reach;with nursing/sitter in room Nurse Communication: Mobility status   GP     [facility] 11/30/2012, 1:46 PM  [employer] PT  [phone]   

## 2012-11-30 NOTE — Progress Notes (Signed)
[facility] PROGRESS NOTE  Jean [name]:[id number] DOB: [birth year] DOA: 11/26/2012 PCP: Sonda Primes, MD  Assessment/Plan: 79 y.o. female with history of CVA in the past, CAD, CHF, hypertension, possible underlying dementia presented to [facility] today after falling several times at home    1. Fall likely mechanical worse with UTI  -neuro exam is non focal; CT head: old CVA, brain atrophy; Scalp laceration  -PT/OT : SNF;  2. AKI on CKD III likely bactrim toxicity;  -d/c bactrim; gentle IVF; renal function improving  3. UTI failed outpatient bactrim; previous urine c/s E coli; repeat urine c/s no significant growth; improved on levofloxacin, to complete outpatient dose  4. Hypertension  - continue home medications; hold ARB due to AKI  5. Systolic CHF, LVEF 45% (08/2011) clinically stable; not on diuretics; ARB on hold due to AKI  6. CAD, no chest pain at this time. Decrease ASA to 81 due to high risk of bleeding with plavix;  7. Hypothyroidism, continue home meds.  8. MCI likely underlying dementia;   Code Status: full  Family Communication: husband at the bedside  (indicate person spoken with, relationship, and if by phone, the number) Disposition Plan: SNF; when bed is available    Consultants:  No   Procedures:  CT head   Antibiotics:  no (indicate start date, and stop date if known)  HPI/Subjective: Alert, oriented   Objective: Filed Vitals:   11/30/12 0528  BP: 144/59  Pulse: 62  Temp: 97.9 F (36.6 C)  Resp: 18    Intake/Output Summary (Last 24 hours) at 11/30/12 0909 Last data filed at 11/30/12 0700  Gross per 24 hour  Intake    600 ml  Output      0 ml  Net    600 ml   Filed Weights   11/26/12 2102 11/27/12 2108 11/28/12 2052  Weight: 74.1 kg (163 lb 5.8 oz) 73.5 kg (162 lb 0.6 oz) 71.5 kg (157 lb 10.1 oz)    Exam:   General:  Alert   Cardiovascular: s1,s2 rrr  Respiratory: cta bl   Abdomen: soft, nt, nd    Musculoskeletal: no LE edema    Data Reviewed: Basic Metabolic Panel:  Recent Labs Lab 11/26/12 1000 11/26/12 1757 11/27/12 0629 11/28/12 0533 11/29/12 0526  NA 138  --  136 137 134*  K 4.8  --  4.2 4.3 4.2  CL 104  --  103 104 102  CO2 23  --  21 20 15*  GLUCOSE 105*  --  91 99 97  BUN 27*  --  23 28* 22  CREATININE 2.00* 1.90* 1.82* 1.85* 1.67*  CALCIUM 10.2  --  9.2 9.2 9.6   Liver Function Tests:  Recent Labs Lab 11/27/12 0629  AST 20  ALT 10  ALKPHOS 89  BILITOT 0.4  PROT 6.8  ALBUMIN 3.4*   No results found for this basename: LIPASE, AMYLASE,  in the last 168 hours No results found for this basename: AMMONIA,  in the last 168 hours CBC:  Recent Labs Lab 11/26/12 1000 11/26/12 1757 11/27/12 0629  WBC 7.6 7.0 6.0  HGB 11.4* 11.5* 11.2*  HCT 34.4* 33.7* 32.6*  MCV 89.6 88.2 87.2  PLT 214 222 193   Cardiac Enzymes:  Recent Labs Lab 11/26/12 1757  TROPONINI <0.30   BNP (last 3 results) No results found for this basename: PROBNP,  in the last 8760 hours CBG: No results found for this basename: GLUCAP,  in the last 168 hours  Recent Results (from the past 240 hour(s))  URINE CULTURE     Status: None   Collection Time    11/27/12 12:34 PM      Result Value Range Status   Specimen Description URINE, RANDOM   Final   Special Requests NONE   Final   Culture  Setup Time     Final   Value: 11/27/2012 14:46     Performed at [facility] Count     Final   Value: 1,000 COLONIES/ML     Performed at [facility]   Culture     Final   Value: INSIGNIFICANT GROWTH     Performed at [facility]   Report Status 11/28/2012 FINAL   Final     Studies: No results found.  Scheduled Meds: . amLODipine  5 mg Oral Daily  . aspirin EC  81 mg Oral Daily  . cholecalciferol  1,000 Units Oral Daily  . clopidogrel  75 mg Oral Daily  . docusate sodium  100 mg Oral BID  . FLUoxetine  10 mg Oral Daily  . heparin  5,000 Units  Subcutaneous Q8H  . levofloxacin  750 mg Oral Q48H  . levothyroxine  75 mcg Oral QAC breakfast  . losartan  100 mg Oral Daily  . metoprolol  150 mg Oral BID  . pantoprazole  40 mg Oral Daily  . senna  1 tablet Oral BID  . sodium chloride  3 mL Intravenous Q12H  . vitamin B-12  100 mcg Oral Daily   Continuous Infusions: . sodium chloride 75 mL/hr at 11/28/12 1030    Principal Problem:   Ambulatory dysfunction Active Problems:   HYPOTHYROIDISM, UNSPECIFIED   HYPERLIPIDEMIA   HYPERTENSION   CORONARY ARTERY DISEASE   RENAL INSUFFICIENCY   ARF (acute renal failure)   Falls frequently   Scalp laceration   Fall at home   Systolic CHF    Time spent: > 35 minutes     [name]  [facility] Pager [phone]. If 7PM-7AM, please contact night-coverage at [email or web address], password [facility] 11/30/2012, 9:09 AM  LOS: 4 days

## 2012-11-30 NOTE — Clinical Social Work Psychosocial (Signed)
Clinical Social Work Department BRIEF PSYCHOSOCIAL ASSESSMENT 11/30/2012  Patient:  Jean [name], Jean [name]     Account Number:  [id number]     Admit date:  11/26/2012  Clinical Social Worker:  Delmer Islam  Date/Time:  11/30/2012 02:10 AM  Referred by:  Physician  Date Referred:  11/28/2012 Referred for  SNF Placement   Other Referral:   Interview type:  Patient Other interview type:   CSW also talked with patient's husband Eduarda Scrivens [phone]).    PSYCHOSOCIAL DATA Living Status:  HUSBAND Admitted from facility:   Level of care:   Primary support name:  Wilfred Curtis Primary support relationship to patient:  SPOUSE Degree of support available:   Spouse at the bedside and is very concerned about patient's health and d/c plans. Mr. and Mrs. Furth also have two daughters: Marella Bile and Pincus Large.    CURRENT CONCERNS Current Concerns  Post-Acute Placement   Other Concerns:    SOCIAL WORK ASSESSMENT / PLAN On 11/29/12 CSW talked initially with patient and then with husband regarding d/c plans and MD/PT's recommendation of ST rehab. Both in agreement and CSW explained SNF search process and husband was given a list of facilities in [place]. When asked about preferences, Mr. Jepsen explained that he would talk with his daughters regarding facility preferences.   Assessment/plan status:  Psychosocial Support/Ongoing Assessment of Needs Other assessment/ plan:   Information/referral to community resources:   Husband given list of SNF's in [facility]    PATIENT'S/FAMILY'S RESPONSE TO PLAN OF CARE: Patient agreeable to rehab and gave CSW his facilities of interest.

## 2012-11-30 NOTE — Clinical Social Work Placement (Addendum)
Clinical Social Work Department CLINICAL SOCIAL WORK PLACEMENT NOTE 11/30/2012  Patient:  Jean [name], Jean [name]  Account Number:  [id number] Admit date:  11/26/2012  Clinical Social Worker:  Genelle Bal, LCSW  Date/time:  11/30/2012 02:30 AM  Clinical Social Work is seeking post-discharge placement for this patient at the following level of care:   SKILLED NURSING   (*CSW will update this form in Epic as items are completed)   11/29/2012  Patient/family provided with [facility] Department of Clinical Social Work's list of facilities offering this level of care within the geographic area requested by the patient (or if unable, by the patient's family).  11/29/2012  Patient/family informed of their freedom to choose among providers that offer the needed level of care, that participate in Medicare, Medicaid or managed care program needed by the patient, have an available bed and are willing to accept the patient.    Patient/family informed of [facility]' ownership interest in [facility], as well as of the fact that they are under no obligation to receive care at this facility.  PASARR submitted to EDS on 07/17/11 PASARR number received from EDS on 07/17/11  FL2 transmitted to all facilities in geographic area requested by pt/family on  11/29/2012 FL2 transmitted to all facilities within larger geographic area on   Patient informed that his/her managed care company has contracts with or will negotiate with  certain facilities, including the following:     Patient/family informed of bed offers received:  11/30/2012 Patient chooses bed at [facility] Physician recommends and patient chooses bed at    Patient to be transferred to [facility] on  11/30/2012 Patient to be transferred to facility by ambulance  The following physician request were entered in Epic:   Additional Comments:  11/30/2012: CSW intern contacted patient's  husband by phone confirming that she was discharged to [facility].   Deniece Ree, CSW Intern   Approved and Reviewed by : Genelle Bal, LCSW

## 2012-11-30 NOTE — Progress Notes (Signed)
Patient was discharged by ambulance to [facility]. Patient's husband was contacted before patient was discharged. Report was called prior to patient being discharged. Patient was stable upon discharge.

## 2012-12-01 DIAGNOSIS — M6281 Muscle weakness (generalized): Secondary | ICD-10-CM

## 2012-12-01 DIAGNOSIS — R269 Unspecified abnormalities of gait and mobility: Secondary | ICD-10-CM

## 2012-12-01 DIAGNOSIS — N390 Urinary tract infection, site not specified: Secondary | ICD-10-CM

## 2012-12-01 DIAGNOSIS — I2510 Atherosclerotic heart disease of native coronary artery without angina pectoris: Secondary | ICD-10-CM

## 2012-12-01 DIAGNOSIS — R609 Edema, unspecified: Secondary | ICD-10-CM

## 2012-12-01 DIAGNOSIS — Z9181 History of falling: Secondary | ICD-10-CM

## 2013-02-14 ENCOUNTER — Telehealth: Payer: Self-pay | Admitting: Internal Medicine

## 2013-02-14 DIAGNOSIS — R627 Adult failure to thrive: Secondary | ICD-10-CM

## 2013-02-14 DIAGNOSIS — F0280 Dementia in other diseases classified elsewhere without behavioral disturbance: Secondary | ICD-10-CM

## 2013-02-14 DIAGNOSIS — I639 Cerebral infarction, unspecified: Secondary | ICD-10-CM

## 2013-02-14 NOTE — Telephone Encounter (Signed)
Pt was d/c'd from a SNF to home. Dx FTT, dementia, CVA Will ask [facility] to come home ad eval the pt Thx

## 2013-02-16 ENCOUNTER — Ambulatory Visit: Payer: Medicare PPO | Admitting: Internal Medicine

## 2013-02-22 ENCOUNTER — Telehealth: Payer: Self-pay | Admitting: *Deleted

## 2013-02-22 NOTE — Telephone Encounter (Signed)
Caller left vm stating she was in home doing a wellness visit. The pt c/o vertigo and HR in 50s. She is currently taking Metoprolol 150 mg 1 bid. She wants to know is pt should continue Metoprolol at this dose. Please advise--call Mr. Ruzich with MD advisement.

## 2013-02-22 NOTE — Telephone Encounter (Signed)
Pt's husband informed.

## 2013-02-22 NOTE — Telephone Encounter (Signed)
Pls decrease Metoprolol to 1/2 tab bid Thx

## 2013-02-26 ENCOUNTER — Emergency Department (HOSPITAL_COMMUNITY): Payer: Medicare PPO

## 2013-02-26 ENCOUNTER — Encounter (HOSPITAL_COMMUNITY): Payer: Self-pay | Admitting: Emergency Medicine

## 2013-02-26 ENCOUNTER — Inpatient Hospital Stay (HOSPITAL_COMMUNITY)
Admission: EM | Admit: 2013-02-26 | Discharge: 2013-03-02 | DRG: 690 | Disposition: A | Discharge status: Skilled Nursing Facility | Payer: Medicare PPO | Attending: Internal Medicine | Admitting: Internal Medicine

## 2013-02-26 DIAGNOSIS — G309 Alzheimer's disease, unspecified: Secondary | ICD-10-CM | POA: Diagnosis present

## 2013-02-26 DIAGNOSIS — I2510 Atherosclerotic heart disease of native coronary artery without angina pectoris: Secondary | ICD-10-CM | POA: Diagnosis present

## 2013-02-26 DIAGNOSIS — N390 Urinary tract infection, site not specified: Principal | ICD-10-CM | POA: Diagnosis present

## 2013-02-26 DIAGNOSIS — F329 Major depressive disorder, single episode, unspecified: Secondary | ICD-10-CM | POA: Diagnosis present

## 2013-02-26 DIAGNOSIS — R509 Fever, unspecified: Secondary | ICD-10-CM | POA: Diagnosis present

## 2013-02-26 DIAGNOSIS — I252 Old myocardial infarction: Secondary | ICD-10-CM

## 2013-02-26 DIAGNOSIS — Z7982 Long term (current) use of aspirin: Secondary | ICD-10-CM

## 2013-02-26 DIAGNOSIS — Z9181 History of falling: Secondary | ICD-10-CM

## 2013-02-26 DIAGNOSIS — IMO0002 Reserved for concepts with insufficient information to code with codable children: Secondary | ICD-10-CM

## 2013-02-26 DIAGNOSIS — Z87440 Personal history of urinary (tract) infections: Secondary | ICD-10-CM

## 2013-02-26 DIAGNOSIS — R5381 Other malaise: Secondary | ICD-10-CM | POA: Diagnosis present

## 2013-02-26 DIAGNOSIS — I672 Cerebral atherosclerosis: Secondary | ICD-10-CM | POA: Diagnosis present

## 2013-02-26 DIAGNOSIS — F0150 Vascular dementia without behavioral disturbance: Secondary | ICD-10-CM | POA: Diagnosis present

## 2013-02-26 DIAGNOSIS — I10 Essential (primary) hypertension: Secondary | ICD-10-CM | POA: Diagnosis present

## 2013-02-26 DIAGNOSIS — R4182 Altered mental status, unspecified: Secondary | ICD-10-CM | POA: Diagnosis present

## 2013-02-26 DIAGNOSIS — E860 Dehydration: Secondary | ICD-10-CM | POA: Diagnosis present

## 2013-02-26 DIAGNOSIS — F0280 Dementia in other diseases classified elsewhere without behavioral disturbance: Secondary | ICD-10-CM | POA: Diagnosis present

## 2013-02-26 DIAGNOSIS — Z8673 Personal history of transient ischemic attack (TIA), and cerebral infarction without residual deficits: Secondary | ICD-10-CM

## 2013-02-26 DIAGNOSIS — R627 Adult failure to thrive: Secondary | ICD-10-CM | POA: Diagnosis present

## 2013-02-26 DIAGNOSIS — R296 Repeated falls: Secondary | ICD-10-CM

## 2013-02-26 DIAGNOSIS — Z9861 Coronary angioplasty status: Secondary | ICD-10-CM

## 2013-02-26 DIAGNOSIS — N183 Chronic kidney disease, stage 3 unspecified: Secondary | ICD-10-CM | POA: Diagnosis present

## 2013-02-26 DIAGNOSIS — I059 Rheumatic mitral valve disease, unspecified: Secondary | ICD-10-CM | POA: Diagnosis present

## 2013-02-26 DIAGNOSIS — E039 Hypothyroidism, unspecified: Secondary | ICD-10-CM | POA: Diagnosis present

## 2013-02-26 DIAGNOSIS — E559 Vitamin D deficiency, unspecified: Secondary | ICD-10-CM | POA: Diagnosis present

## 2013-02-26 DIAGNOSIS — N179 Acute kidney failure, unspecified: Secondary | ICD-10-CM

## 2013-02-26 DIAGNOSIS — I129 Hypertensive chronic kidney disease with stage 1 through stage 4 chronic kidney disease, or unspecified chronic kidney disease: Secondary | ICD-10-CM | POA: Diagnosis present

## 2013-02-26 DIAGNOSIS — F3289 Other specified depressive episodes: Secondary | ICD-10-CM | POA: Diagnosis present

## 2013-02-26 DIAGNOSIS — Z8249 Family history of ischemic heart disease and other diseases of the circulatory system: Secondary | ICD-10-CM

## 2013-02-26 DIAGNOSIS — E785 Hyperlipidemia, unspecified: Secondary | ICD-10-CM | POA: Diagnosis present

## 2013-02-26 LAB — CBC WITH DIFFERENTIAL/PLATELET
Basophils Relative: 0 % (ref 0–1)
Eosinophils Relative: 1 % (ref 0–5)
HCT: 36.9 % (ref 36.0–46.0)
Hemoglobin: 12.5 g/dL (ref 12.0–15.0)
Lymphocytes Relative: 10 % — ABNORMAL LOW (ref 12–46)
Lymphs Abs: 1 10*3/uL (ref 0.7–4.0)
MCH: 29.3 pg (ref 26.0–34.0)
MCV: 86.6 fL (ref 78.0–100.0)
Monocytes Absolute: 1 10*3/uL (ref 0.1–1.0)
Monocytes Relative: 9 % (ref 3–12)
Platelets: 224 10*3/uL (ref 150–400)
RBC: 4.26 MIL/uL (ref 3.87–5.11)
RDW: 13.3 % (ref 11.5–15.5)
WBC: 10.3 10*3/uL (ref 4.0–10.5)

## 2013-02-26 LAB — COMPREHENSIVE METABOLIC PANEL
ALT: 10 U/L (ref 0–35)
BUN: 21 mg/dL (ref 6–23)
CO2: 23 mEq/L (ref 19–32)
Calcium: 9.9 mg/dL (ref 8.4–10.5)
GFR calc Af Amer: 35 mL/min — ABNORMAL LOW (ref >90)
GFR calc non Af Amer: 30 mL/min — ABNORMAL LOW (ref >90)
Glucose, Bld: 125 mg/dL — ABNORMAL HIGH (ref 70–99)
Sodium: 137 mEq/L (ref 135–145)
Total Protein: 7.8 g/dL (ref 6.0–8.3)

## 2013-02-26 LAB — URINALYSIS W MICROSCOPIC + REFLEX CULTURE
Bilirubin Urine: NEGATIVE
Glucose, UA: NEGATIVE mg/dL
Protein, ur: 100 mg/dL — AB
Urobilinogen, UA: 0.2 mg/dL (ref 0.0–1.0)

## 2013-02-26 MED ORDER — LEVOTHYROXINE SODIUM 75 MCG PO TABS
75.0000 ug | ORAL_TABLET | Freq: Every day | ORAL | Status: DC
  Administered 2013-02-28 – 2013-03-02 (×3): 75 ug via ORAL
  Filled 2013-02-26 (×5): qty 1

## 2013-02-26 MED ORDER — CLOPIDOGREL BISULFATE 75 MG PO TABS
75.0000 mg | ORAL_TABLET | Freq: Every day | ORAL | Status: DC
  Administered 2013-02-28 – 2013-03-02 (×3): 75 mg via ORAL
  Filled 2013-02-26 (×4): qty 1

## 2013-02-26 MED ORDER — METOPROLOL TARTRATE 50 MG PO TABS
150.0000 mg | ORAL_TABLET | Freq: Two times a day (BID) | ORAL | Status: DC
  Administered 2013-02-27 – 2013-03-02 (×7): 150 mg via ORAL
  Filled 2013-02-26 (×9): qty 1

## 2013-02-26 MED ORDER — ASPIRIN EC 81 MG PO TBEC
81.0000 mg | DELAYED_RELEASE_TABLET | Freq: Every day | ORAL | Status: DC
  Administered 2013-02-28 – 2013-03-02 (×3): 81 mg via ORAL
  Filled 2013-02-26 (×4): qty 1

## 2013-02-26 MED ORDER — AMLODIPINE BESYLATE 5 MG PO TABS
5.0000 mg | ORAL_TABLET | Freq: Every day | ORAL | Status: DC
  Administered 2013-02-28 – 2013-03-02 (×3): 5 mg via ORAL
  Filled 2013-02-26 (×4): qty 1

## 2013-02-26 MED ORDER — PANTOPRAZOLE SODIUM 40 MG PO TBEC
40.0000 mg | DELAYED_RELEASE_TABLET | Freq: Every day | ORAL | Status: DC
  Administered 2013-02-28 – 2013-03-02 (×3): 40 mg via ORAL
  Filled 2013-02-26 (×4): qty 1

## 2013-02-26 MED ORDER — VITAMIN B-12 100 MCG PO TABS
100.0000 ug | ORAL_TABLET | Freq: Every day | ORAL | Status: DC
  Administered 2013-02-28 – 2013-03-02 (×3): 100 ug via ORAL
  Filled 2013-02-26 (×4): qty 1

## 2013-02-26 MED ORDER — HEPARIN SODIUM (PORCINE) 5000 UNIT/ML IJ SOLN
5000.0000 [IU] | Freq: Three times a day (TID) | INTRAMUSCULAR | Status: DC
  Administered 2013-02-26 – 2013-02-27 (×2): 5000 [IU] via SUBCUTANEOUS
  Filled 2013-02-26 (×5): qty 1

## 2013-02-26 MED ORDER — DEXTROSE 5 % IV SOLN
1.0000 g | Freq: Once | INTRAVENOUS | Status: AC
  Administered 2013-02-26: 1 g via INTRAVENOUS
  Filled 2013-02-26: qty 10

## 2013-02-26 MED ORDER — SODIUM CHLORIDE 0.9 % IV BOLUS (SEPSIS)
500.0000 mL | Freq: Once | INTRAVENOUS | Status: AC
  Administered 2013-02-26: 500 mL via INTRAVENOUS

## 2013-02-26 MED ORDER — DEXTROSE 5 % IV SOLN
1.0000 g | INTRAVENOUS | Status: DC
  Administered 2013-02-27 – 2013-03-01 (×3): 1 g via INTRAVENOUS
  Filled 2013-02-26 (×4): qty 10

## 2013-02-26 MED ORDER — SODIUM CHLORIDE 0.9 % IV BOLUS (SEPSIS): 1000.0000 mL | INTRAVENOUS | Status: DC

## 2013-02-26 MED ORDER — MECLIZINE HCL 25 MG PO TABS
25.0000 mg | ORAL_TABLET | Freq: Three times a day (TID) | ORAL | Status: DC | PRN
  Administered 2013-02-28: 25 mg via ORAL
  Filled 2013-02-26: qty 1

## 2013-02-26 MED ORDER — FLUOXETINE HCL 10 MG PO CAPS
10.0000 mg | ORAL_CAPSULE | Freq: Every day | ORAL | Status: DC
  Administered 2013-02-28 – 2013-03-02 (×3): 10 mg via ORAL
  Filled 2013-02-26 (×4): qty 1

## 2013-02-26 NOTE — ED Notes (Signed)
Report given to [facility], RN  All questions answered  Patient in NAD upon leaving ED for floor bed

## 2013-02-26 NOTE — ED Notes (Signed)
Bed: ZO10 Expected date:  Expected time:  Means of arrival:  Comments: EMS/increased weakness

## 2013-02-26 NOTE — ED Notes (Addendum)
Per EMS pt has had increased weakness and flank pain for 2 weeks. Pt has had decreased appetite and dysuria. Pt/family report hx of UTIs and flank pain pt is experiencing is constant with past diagnoses. On arrival pt has temp 100.3.

## 2013-02-26 NOTE — H&P (Signed)
[facility] History and Physical  Jean [name] UEA:[id number] DOB: [birth year] DOA: 02/26/2013  Referring physician: EDP PCP: Sonda Primes, MD   Chief Complaint: Flank pain, fatigue   HPI: Jean [name] is a 79 y.o. female who presents to the ED with 2 week history of flank pain and fatigue.  This started just after she got out of a nursing home and returned back home.  Symptoms associated with decreased appetitie and dysuria.  Patient also has had multiple falls at home over the past 2 weeks.  Patient with h/o UTIs with similar symptoms according to family.  Temperature in ED 102.1, diagnosed with UTI on UA.  Review of Systems: Systems reviewed.  As above, otherwise negative  Past Medical History  Diagnosis Date  . Hyperlipidemia   . Hypothyroid     hypo  . CHF (congestive heart failure)   . Mitral regurgitation   . Tricuspid regurgitation   . Anemia   . Arthritis     Osteoarthtistis  . Elevated glucose   . CVA (cerebral infarction)   . Vitamin D deficiency   . Myocardial infarction   . Chronic kidney failure     stage 3  . CAD (coronary artery disease)     sees Dr. Ricka Burdock last 03/2011  . Hypertension     Dr. Eden Emms cardiologist, Dr. Cheron Schaumann primary doctor   Past Surgical History  Procedure Laterality Date  . Abdominal hysterectomy    . Back surgery    . Coronary stent placement    . Laparotomy  06/23/2011    Procedure: EXPLORATORY LAPAROTOMY;  Surgeon: Barnett Abu, MD;  Location: [facility];  Service: Neurosurgery;  Laterality: Bilateral;  . Esophagogastroduodenoscopy N/A 04/28/2012    Procedure: ESOPHAGOGASTRODUODENOSCOPY (EGD);  Surgeon: Hart Carwin, MD;  Location: [facility];  Service: Endoscopy;  Laterality: N/A;   Social History:  reports that she has never smoked. She has never used smokeless tobacco. She reports that she does not drink alcohol or use illicit drugs.  Allergies  Allergen Reactions  . Ace Inhibitors     unknown  .  Aricept [Donepezil Hcl]     Sick   . Ezetimibe-Simvastatin     REACTION: Myalgia  . Methyldopa     tired  . Rosuvastatin     REACTION: aches  . Penicillins Rash    Family History  Problem Relation Age of Onset  . Aneurysm Mother   . Heart disease Mother   . Heart attack Father   . Hypertension Father   . Anesthesia problems Neg Hx   . Colon cancer Neg Hx   . Esophageal cancer Neg Hx      Prior to Admission medications   Medication Sig Start Date End Date Taking? Authorizing Provider  amLODipine (NORVASC) 5 MG tablet Take 1 tablet (5 mg total) by mouth daily. 08/12/12   Georgina Quint Plotnikov, MD  aspirin EC 81 MG EC tablet Take 1 tablet (81 mg total) by mouth daily. 11/29/12   [name], MD  cholecalciferol (VITAMIN D) 1000 UNITS tablet Take 1,000 Units by mouth daily.    Tresa Garter, MD  clopidogrel (PLAVIX) 75 MG tablet Take 1 tablet (75 mg total) by mouth daily. 04/29/12   Georgina Quint Plotnikov, MD  Coenzyme Q10 (CO Q 10 PO) Take 1 tablet by mouth daily.     Historical Provider, MD  ergocalciferol (VITAMIN D2) 50000 UNITS capsule Take 1 capsule (50,000 Units total) by mouth once a week. 04/28/12  Hart Carwin, MD  FLUoxetine (PROZAC) 10 MG tablet Take 1 tablet (10 mg total) by mouth daily. 08/12/12   Georgina Quint Plotnikov, MD  levofloxacin (LEVAQUIN) 750 MG tablet Take 1 tablet (750 mg total) by mouth every other day. 11/29/12   [name], MD  levothyroxine (SYNTHROID, LEVOTHROID) 75 MCG tablet Take 1 tablet (75 mcg total) by mouth daily. 04/29/12   Georgina Quint Plotnikov, MD  meclizine (ANTIVERT) 25 MG tablet Take 1 tablet (25 mg total) by mouth 3 (three) times daily as needed. 04/28/12   Hart Carwin, MD  metoprolol (LOPRESSOR) 100 MG tablet Take 1.5 tablets (150 mg total) by mouth 2 (two) times daily. 05/18/12   Georgina Quint Plotnikov, MD  nitroGLYCERIN (NITROSTAT) 0.4 MG SL tablet Place 0.4 mg under the tongue every 5 (five) minutes as needed. For chest pain     Historical Provider, MD  omeprazole (PRILOSEC) 20 MG capsule Take 1 capsule (20 mg total) by mouth daily. 08/12/12   Georgina Quint Plotnikov, MD  pravastatin (PRAVACHOL) 20 MG tablet Take 1 tablet (20 mg total) by mouth 2 (two) times daily. 05/13/12   Georgina Quint Plotnikov, MD  vitamin B-12 (CYANOCOBALAMIN) 100 MCG tablet Take 100 mcg by mouth daily.    Historical Provider, MD   Physical Exam: Filed Vitals:   02/26/13 1910  BP:   Pulse:   Temp: 102.1 F (38.9 C)  Resp:     BP 153/63  Pulse 80  Temp(Src) 102.1 F (38.9 C) (Rectal)  Resp 16  SpO2 97%  General Appearance:    Alert, oriented, no distress, appears stated age  Head:    Normocephalic, atraumatic  Eyes:    PERRL, EOMI, sclera non-icteric        Nose:   Nares without drainage or epistaxis. Mucosa, turbinates normal  Throat:   Moist mucous membranes. Oropharynx without erythema or exudate.  Neck:   Supple. No carotid bruits.  No thyromegaly.  No lymphadenopathy.   Back:     No CVA tenderness, no spinal tenderness  Lungs:     Clear to auscultation bilaterally, without wheezes, rhonchi or rales  Chest wall:    No tenderness to palpitation  Heart:    Regular rate and rhythm without murmurs, gallops, rubs  Abdomen:     Soft, non-tender, nondistended, normal bowel sounds, no organomegaly  Genitalia:    deferred  Rectal:    deferred  Extremities:   No clubbing, cyanosis or edema.  Pulses:   2+ and symmetric all extremities  Skin:   Skin color, texture, turgor normal, no rashes or lesions  Lymph nodes:   Cervical, supraclavicular, and axillary nodes normal  Neurologic:   CNII-XII intact. Normal strength, sensation and reflexes      throughout    Labs on Admission:  Basic Metabolic Panel:  Recent Labs Lab 02/26/13 1920  NA 137  K 4.1  CL 102  CO2 23  GLUCOSE 125*  BUN 21  CREATININE 1.54*  CALCIUM 9.9   Liver Function Tests:  Recent Labs Lab 02/26/13 1920  AST 22  ALT 10  ALKPHOS 85  BILITOT 0.3  PROT 7.8   ALBUMIN 3.6    Recent Labs Lab 02/26/13 1920  LIPASE 50   No results found for this basename: AMMONIA,  in the last 168 hours CBC:  Recent Labs Lab 02/26/13 1920  WBC 10.3  NEUTROABS 8.3*  HGB 12.5  HCT 36.9  MCV 86.6  PLT 224   Cardiac Enzymes:  No results found for this basename: CKTOTAL, CKMB, CKMBINDEX, TROPONINI,  in the last 168 hours  BNP (last 3 results) No results found for this basename: PROBNP,  in the last 8760 hours CBG: No results found for this basename: GLUCAP,  in the last 168 hours  Radiological Exams on Admission: Dg Chest 2 View  02/26/2013   CLINICAL DATA:  Fall, altered mental status, history of CHF  EXAM: CHEST  2 VIEW  COMPARISON:  04/28/2012  FINDINGS: Stable cardiomegaly with mild vascular prominence centrally. No CHF or pneumonia. Negative for pleural effusion or pneumothorax. Trachea midline. Tortuous thoracic aorta contour. Degenerative changes of the spine. Stable exam.  IMPRESSION: Cardiomegaly without CHF or pneumonia.   Electronically Signed   By: Ruel Favors M.D.   On: 02/26/2013 20:59   Ct Head Wo Contrast  02/26/2013   CLINICAL DATA:  Fall.  Fatigued.  EXAM: CT HEAD WITHOUT CONTRAST  TECHNIQUE: Contiguous axial images were obtained from the base of the skull through the vertex without intravenous contrast.  COMPARISON:  11/26/2012  FINDINGS: Ventricles are normal in configuration. There is ventricular and sulcal enlargement reflecting moderate atrophy. There multiple white matter lacunar infarcts, old as well as basal gangliar/thymic lacunar infarcts. There is an old left cerebellar infarct. These are stable. Extensive white matter hypoattenuation is also noted consistent with chronic microvascular ischemic change. There is no evidence of a cortical infarct. There are no parenchymal masses or mass effect.  There are no extra-axial masses or abnormal fluid collections.  There is no intracranial hemorrhage.  The visualized sinuses and mastoid  air cells are clear.  IMPRESSION: No acute intracranial abnormalities. Stable appearance from the prior head CT.  Atrophy, advanced chronic microvascular ischemic change and multiple old infarcts.   Electronically Signed   By: Amie Portland M.D.   On: 02/26/2013 21:12    EKG: Independently reviewed.   Assessment/Plan Principal Problem:   UTI (lower urinary tract infection)   1. UTI - causing a mild amount of delerium as well, admitting patient for rocephin given fall risk at home.    Code Status: Full Code  Family Communication: Family at bedside Disposition Plan: Admit to inpatient   Time spent: 50 min  Billyjoe Go M. [facility] Pager [phone]  If 7AM-7PM, please contact the day team taking care of the patient [email or web address] Password [facility] 02/26/2013, 10:26 PM

## 2013-02-26 NOTE — ED Notes (Signed)
Pt oriented to self and place. Pt not able to report date/time.

## 2013-02-26 NOTE — ED Provider Notes (Signed)
CSN: [id number]     Arrival date & time 02/26/13  1847 History   First MD Initiated Contact with Patient 02/26/13 1900     Chief Complaint  Patient presents with  . Flank Pain  . Fatigue   (Consider location/radiation/quality/duration/timing/severity/associated sxs/prior Treatment) Patient is a 79 y.o. female presenting with neurologic complaint. The history is provided by the spouse.  Neurologic Problem This is a new problem. The current episode started 6 to 12 hours ago. The problem occurs constantly. The problem has been gradually worsening. Pertinent negatives include no chest pain, no abdominal pain, no headaches and no shortness of breath. Nothing aggravates the symptoms. Nothing relieves the symptoms. She has tried nothing for the symptoms. The treatment provided no relief.    Past Medical History  Diagnosis Date  . Hyperlipidemia   . Hypothyroid     hypo  . CHF (congestive heart failure)   . Mitral regurgitation   . Tricuspid regurgitation   . Anemia   . Arthritis     Osteoarthtistis  . Elevated glucose   . CVA (cerebral infarction)   . Vitamin D deficiency   . Myocardial infarction   . Chronic kidney failure     stage 3  . CAD (coronary artery disease)     sees Dr. Ricka Burdock last 03/2011  . Hypertension     Dr. Eden Emms cardiologist, Dr. Cheron Schaumann primary doctor   Past Surgical History  Procedure Laterality Date  . Abdominal hysterectomy    . Back surgery    . Coronary stent placement    . Laparotomy  06/23/2011    Procedure: EXPLORATORY LAPAROTOMY;  Surgeon: Barnett Abu, MD;  Location: [facility];  Service: Neurosurgery;  Laterality: Bilateral;  . Esophagogastroduodenoscopy N/A 04/28/2012    Procedure: ESOPHAGOGASTRODUODENOSCOPY (EGD);  Surgeon: Hart Carwin, MD;  Location: [facility];  Service: Endoscopy;  Laterality: N/A;   Family History  Problem Relation Age of Onset  . Aneurysm Mother   . Heart disease Mother   . Heart attack Father   .  Hypertension Father   . Anesthesia problems Neg Hx   . Colon cancer Neg Hx   . Esophageal cancer Neg Hx    History  Substance Use Topics  . Smoking status: Never Smoker   . Smokeless tobacco: Never Used  . Alcohol Use: No   OB History   Grav Para Term Preterm Abortions TAB SAB Ect Mult Living                 Review of Systems  Constitutional: Negative for fatigue.  HENT: Negative for congestion and drooling.   Eyes: Negative for pain.  Respiratory: Positive for cough. Negative for shortness of breath.   Cardiovascular: Negative for chest pain.  Gastrointestinal: Negative for nausea, vomiting, abdominal pain and diarrhea.  Genitourinary: Negative for dysuria and hematuria.  Musculoskeletal: Negative for back pain, gait problem and neck pain.  Skin: Negative for color change.  Neurological: Negative for dizziness and headaches.  Hematological: Negative for adenopathy.  Psychiatric/Behavioral: Negative for behavioral problems.  All other systems reviewed and are negative.    Allergies  Ace inhibitors; Aricept; Ezetimibe-simvastatin; Methyldopa; Rosuvastatin; and Penicillins  Home Medications   Current Outpatient Rx  Name  Route  Sig  Dispense  Refill  . amLODipine (NORVASC) 5 MG tablet   Oral   Take 1 tablet (5 mg total) by mouth daily.   90 tablet   3   . aspirin EC 81 MG EC tablet  Oral   Take 1 tablet (81 mg total) by mouth daily.         . cholecalciferol (VITAMIN D) 1000 UNITS tablet   Oral   Take 1,000 Units by mouth daily.         . clopidogrel (PLAVIX) 75 MG tablet   Oral   Take 1 tablet (75 mg total) by mouth daily.   90 tablet   2   . Coenzyme Q10 (CO Q 10 PO)   Oral   Take 1 tablet by mouth daily.          . ergocalciferol (VITAMIN D2) 50000 UNITS capsule   Oral   Take 1 capsule (50,000 Units total) by mouth once a week.   6 capsule   0   . FLUoxetine (PROZAC) 10 MG tablet   Oral   Take 1 tablet (10 mg total) by mouth daily.   90  tablet   3   . levofloxacin (LEVAQUIN) 750 MG tablet   Oral   Take 1 tablet (750 mg total) by mouth every other day.   2 tablet   0   . levothyroxine (SYNTHROID, LEVOTHROID) 75 MCG tablet   Oral   Take 1 tablet (75 mcg total) by mouth daily.   90 tablet   2   . meclizine (ANTIVERT) 25 MG tablet   Oral   Take 1 tablet (25 mg total) by mouth 3 (three) times daily as needed.   270 tablet   3   . metoprolol (LOPRESSOR) 100 MG tablet   Oral   Take 1.5 tablets (150 mg total) by mouth 2 (two) times daily.   270 tablet   1   . nitroGLYCERIN (NITROSTAT) 0.4 MG SL tablet   Sublingual   Place 0.4 mg under the tongue every 5 (five) minutes as needed. For chest pain         . omeprazole (PRILOSEC) 20 MG capsule   Oral   Take 1 capsule (20 mg total) by mouth daily.   90 capsule   3   . pravastatin (PRAVACHOL) 20 MG tablet   Oral   Take 1 tablet (20 mg total) by mouth 2 (two) times daily.   180 tablet   2   . vitamin B-12 (CYANOCOBALAMIN) 100 MCG tablet   Oral   Take 100 mcg by mouth daily.          BP 153/63  Pulse 80  Temp(Src) 100.3 F (37.9 C) (Oral)  Resp 16  SpO2 97% Physical Exam  Nursing note and vitals reviewed. Constitutional: She appears well-developed and well-nourished.  HENT:  Head: Normocephalic.  Mouth/Throat: Oropharynx is clear and moist. No oropharyngeal exudate.  Eyes: Conjunctivae and EOM are normal. Pupils are equal, round, and reactive to light.  Neck: Normal range of motion. Neck supple.  Cardiovascular: Normal rate, regular rhythm, normal heart sounds and intact distal pulses.  Exam reveals no gallop and no friction rub.   No murmur heard. Pulmonary/Chest: Effort normal and breath sounds normal. No respiratory distress. She has no wheezes.  Abdominal: Soft. Bowel sounds are normal. There is tenderness (mild suprapubic ttp). There is no rebound and no guarding.  Musculoskeletal: Normal range of motion. She exhibits no edema and no  tenderness.  Neurological: She is alert. No cranial nerve deficit or sensory deficit. Coordination normal.  A/o x 2.   3/5 strength in LLE, 5/5 in all other extremities.   Sensation intact.     Skin: Skin is warm  and dry.  Psychiatric: She has a normal mood and affect. Her behavior is normal.    ED Course  Procedures (including critical care time) Labs Review Labs Reviewed  CBC WITH DIFFERENTIAL - Abnormal; Notable for the following:    Neutrophils Relative % 80 (*)    Neutro Abs 8.3 (*)    Lymphocytes Relative 10 (*)    All other components within normal limits  COMPREHENSIVE METABOLIC PANEL - Abnormal; Notable for the following:    Glucose, Bld 125 (*)    Creatinine, Ser 1.54 (*)    GFR calc non Af Amer 30 (*)    GFR calc Af Amer 35 (*)    All other components within normal limits  URINALYSIS W MICROSCOPIC + REFLEX CULTURE - Abnormal; Notable for the following:    Hgb urine dipstick SMALL (*)    Protein, ur 100 (*)    Nitrite POSITIVE (*)    Leukocytes, UA MODERATE (*)    Bacteria, UA MANY (*)    All other components within normal limits  CBC - Abnormal; Notable for the following:    Hemoglobin 11.4 (*)    HCT 34.4 (*)    All other components within normal limits  BASIC METABOLIC PANEL - Abnormal; Notable for the following:    Glucose, Bld 103 (*)    Creatinine, Ser 1.52 (*)    GFR calc non Af Amer 31 (*)    GFR calc Af Amer 36 (*)    All other components within normal limits  URINE CULTURE  LIPASE, BLOOD   Imaging Review Dg Chest 2 View  02/26/2013   CLINICAL DATA:  Fall, altered mental status, history of CHF  EXAM: CHEST  2 VIEW  COMPARISON:  04/28/2012  FINDINGS: Stable cardiomegaly with mild vascular prominence centrally. No CHF or pneumonia. Negative for pleural effusion or pneumothorax. Trachea midline. Tortuous thoracic aorta contour. Degenerative changes of the spine. Stable exam.  IMPRESSION: Cardiomegaly without CHF or pneumonia.   Electronically Signed    By: Ruel Favors M.D.   On: 02/26/2013 20:59   Ct Head Wo Contrast  02/26/2013   CLINICAL DATA:  Fall.  Fatigued.  EXAM: CT HEAD WITHOUT CONTRAST  TECHNIQUE: Contiguous axial images were obtained from the base of the skull through the vertex without intravenous contrast.  COMPARISON:  11/26/2012  FINDINGS: Ventricles are normal in configuration. There is ventricular and sulcal enlargement reflecting moderate atrophy. There multiple white matter lacunar infarcts, old as well as basal gangliar/thymic lacunar infarcts. There is an old left cerebellar infarct. These are stable. Extensive white matter hypoattenuation is also noted consistent with chronic microvascular ischemic change. There is no evidence of a cortical infarct. There are no parenchymal masses or mass effect.  There are no extra-axial masses or abnormal fluid collections.  There is no intracranial hemorrhage.  The visualized sinuses and mastoid air cells are clear.  IMPRESSION: No acute intracranial abnormalities. Stable appearance from the prior head CT.  Atrophy, advanced chronic microvascular ischemic change and multiple old infarcts.   Electronically Signed   By: Amie Portland M.D.   On: 02/26/2013 21:12      MDM   1. UTI (lower urinary tract infection)   2. Altered mental status    7:07 PM 79 y.o. female CAD [place] with reports of generalized weakness and suprapubic pain. The patient reportedly has a history of UTIs. She is alert and oriented x2 here but is a poor historian. Reportedly family is in route. The patient  has a low-grade temperature here, vital signs are otherwise unremarkable. Will get screening labs, 500 cc IV fluid bolus, and urinalysis while awaiting the family.  The family stating that the patient was in a nursing home for 3 months and has recently been discharged and been home for 2 weeks. Since she's been home she has been unable to ambulate. Family notes that she slid from the bed onto her bottom today. She has had  gradually worsening confusion throughout the day which concerned them. She was also found to have a low-grade temperature here. She has evidence of a UTI now, which we will treat with Rocephin.  Will admit to hospitalist d/t UTI and AMS.   Junius Argyle, MD 02/27/13 1213

## 2013-02-27 DIAGNOSIS — N179 Acute kidney failure, unspecified: Secondary | ICD-10-CM

## 2013-02-27 DIAGNOSIS — F0280 Dementia in other diseases classified elsewhere without behavioral disturbance: Secondary | ICD-10-CM

## 2013-02-27 LAB — CBC
Hemoglobin: 11.4 g/dL — ABNORMAL LOW (ref 12.0–15.0)
MCH: 28.9 pg (ref 26.0–34.0)
MCHC: 33.1 g/dL (ref 30.0–36.0)
Platelets: 203 10*3/uL (ref 150–400)

## 2013-02-27 LAB — BASIC METABOLIC PANEL
Calcium: 9.2 mg/dL (ref 8.4–10.5)
GFR calc Af Amer: 36 mL/min — ABNORMAL LOW (ref >90)
GFR calc non Af Amer: 31 mL/min — ABNORMAL LOW (ref >90)
Glucose, Bld: 103 mg/dL — ABNORMAL HIGH (ref 70–99)
Potassium: 3.7 mEq/L (ref 3.5–5.1)
Sodium: 135 mEq/L (ref 135–145)

## 2013-02-27 MED ORDER — DEXTROSE-NACL 5-0.9 % IV SOLN
INTRAVENOUS | Status: DC
  Administered 2013-02-27 – 2013-03-02 (×4): via INTRAVENOUS

## 2013-02-27 MED ORDER — BIOTENE DRY MOUTH MT LIQD
15.0000 mL | Freq: Two times a day (BID) | OROMUCOSAL | Status: DC
  Administered 2013-02-27 – 2013-03-02 (×6): 15 mL via OROMUCOSAL

## 2013-02-27 NOTE — Progress Notes (Signed)
[facility] PROGRESS NOTE  Jean [name]:[id number] DOB: Dec 23, 1928 DOA: 02/26/2013 PCP: Sonda Primes, MD  Brief narrative: 79 year old female with past medical history of alzheimer's dementia, hypothyroidism, hypertension who presented to [facility] ED 02/26/2013 with complaints of worsening flank pain for few days prior to this admission. Additionally, she experienced poor oral intake, failure to thrive and weakness.  In ED, her T max was 102.1 F but the rest of her vital signs were stable. She was found to have UTI based on urinalysis report. Her CXR was significant for cardiomegaly but no CHF or pneumonia. CT head showed multiple chronic ischemic changes but no acute intracranial findings. Pt was started on empiric treatment with rocephin for UTI.  Assessment/Plan:  Principal Problem:   UTI (lower urinary tract infection) - on rocephin IV daily - follow up urine culture results Active Problems:   HYPOTHYROIDISM, UNSPECIFIED - continue synthroid   HYPERTENSION - continue Norvasc and metoprolol   CORONARY ARTERY DISEASE - continue aspirin and plavix - also used for secondary stroke prophylaxis   ARF (acute renal failure) - likely dehydration and UTI - continue IV fluids - follow up BMP in am   Depression - continue prozac   Alzheimer's disease and vascular dementia - multiple previous infarct identified on CT head but no acute intracranial findings - continue aspirin and plavix   Code Status: full code Family Communication: no family at the bedside Disposition Plan: home when stable, PT eval  [name], MD  [facility] Pager [phone]  If 7PM-7AM, please contact night-coverage [email or web address] Password [facility] 02/27/2013, 7:12 AM   LOS: 1 day   Consultants:  None   Procedures:  None   Antibiotics:  Rocephin 02/26/2013 -->  HPI/Subjective: No acute overnight events.   Objective: Filed Vitals:   02/26/13 1910 02/26/13 2307 02/26/13 2323  02/27/13 0509  BP:  148/56 143/59 135/55  Pulse:  73 79 65  Temp: 102.1 F (38.9 C) 100.6 F (38.1 C) 99.9 F (37.7 C) 99.8 F (37.7 C)  TempSrc: Rectal Oral  Oral  Resp:  15 18 16   Height:   5' 7.5" (1.715 m)   Weight:   67.4 kg (148 lb 9.4 oz)   SpO2:  97% 93% 93%    Intake/Output Summary (Last 24 hours) at 02/27/13 8295 Last data filed at 02/27/13 0509  Gross per 24 hour  Intake      0 ml  Output     10 ml  Net    -10 ml    Exam:   General:  Pt is sleeping, no acute distress  Cardiovascular: Regular rate and rhythm, S1/S2 appreciated  Respiratory: Coarse breath sounds bilaterally   Abdomen: Soft, non tender, non distended, bowel sounds present, no guarding  Extremities: No edema, pulses DP and PT palpable bilaterally  Neuro: Grossly nonfocal  Data Reviewed: Basic Metabolic Panel:  Recent Labs Lab 02/26/13 1920 02/27/13 0445  NA 137 135  K 4.1 3.7  CL 102 101  CO2 23 23  GLUCOSE 125* 103*  BUN 21 19  CREATININE 1.54* 1.52*  CALCIUM 9.9 9.2   Liver Function Tests:  Recent Labs Lab 02/26/13 1920  AST 22  ALT 10  ALKPHOS 85  BILITOT 0.3  PROT 7.8  ALBUMIN 3.6    Recent Labs Lab 02/26/13 1920  LIPASE 50   No results found for this basename: AMMONIA,  in the last 168 hours CBC:  Recent Labs Lab 02/26/13 1920 02/27/13 0445  WBC 10.3  6.6  NEUTROABS 8.3*  --   HGB 12.5 11.4*  HCT 36.9 34.4*  MCV 86.6 87.1  PLT 224 203   Cardiac Enzymes: No results found for this basename: CKTOTAL, CKMB, CKMBINDEX, TROPONINI,  in the last 168 hours BNP: No components found with this basename: POCBNP,  CBG: No results found for this basename: GLUCAP,  in the last 168 hours  No results found for this or any previous visit (from the past 240 hour(s)).   Studies: Dg Chest 2 View 02/26/2013  IMPRESSION: Cardiomegaly without CHF or pneumonia.     Ct Head Wo Contrast 02/26/2013   IMPRESSION: No acute intracranial abnormalities. Stable appearance  from the prior head CT.  Atrophy, advanced chronic microvascular ischemic change and multiple old infarcts.     Scheduled Meds: . amLODipine  5 mg Oral Daily  . aspirin EC  81 mg Oral Daily  . cefTRIAXone (ROCEPHIN)  IV  1 g Intravenous Q24H  . clopidogrel  75 mg Oral Daily  . FLUoxetine  10 mg Oral Daily  . heparin  5,000 Units Subcutaneous Q8H  . levothyroxine  75 mcg Oral QAC breakfast  . metoprolol  150 mg Oral BID  . pantoprazole  40 mg Oral Daily  . vitamin B-12  100 mcg Oral Daily

## 2013-02-27 NOTE — Progress Notes (Signed)
Utilization Review completed.  

## 2013-02-28 IMAGING — CR DG OR LOCAL ABDOMEN
1 series · 1 of 1 positions shown · non-contrast
Comparison: MR lumbar spine 04/18/2011 and lumbar spine series
04/10/2011.

CLINICAL DATA: Final instrumentation count.  Anterior surgical
approach.

OR LOCAL ABDOMEN

[AP]
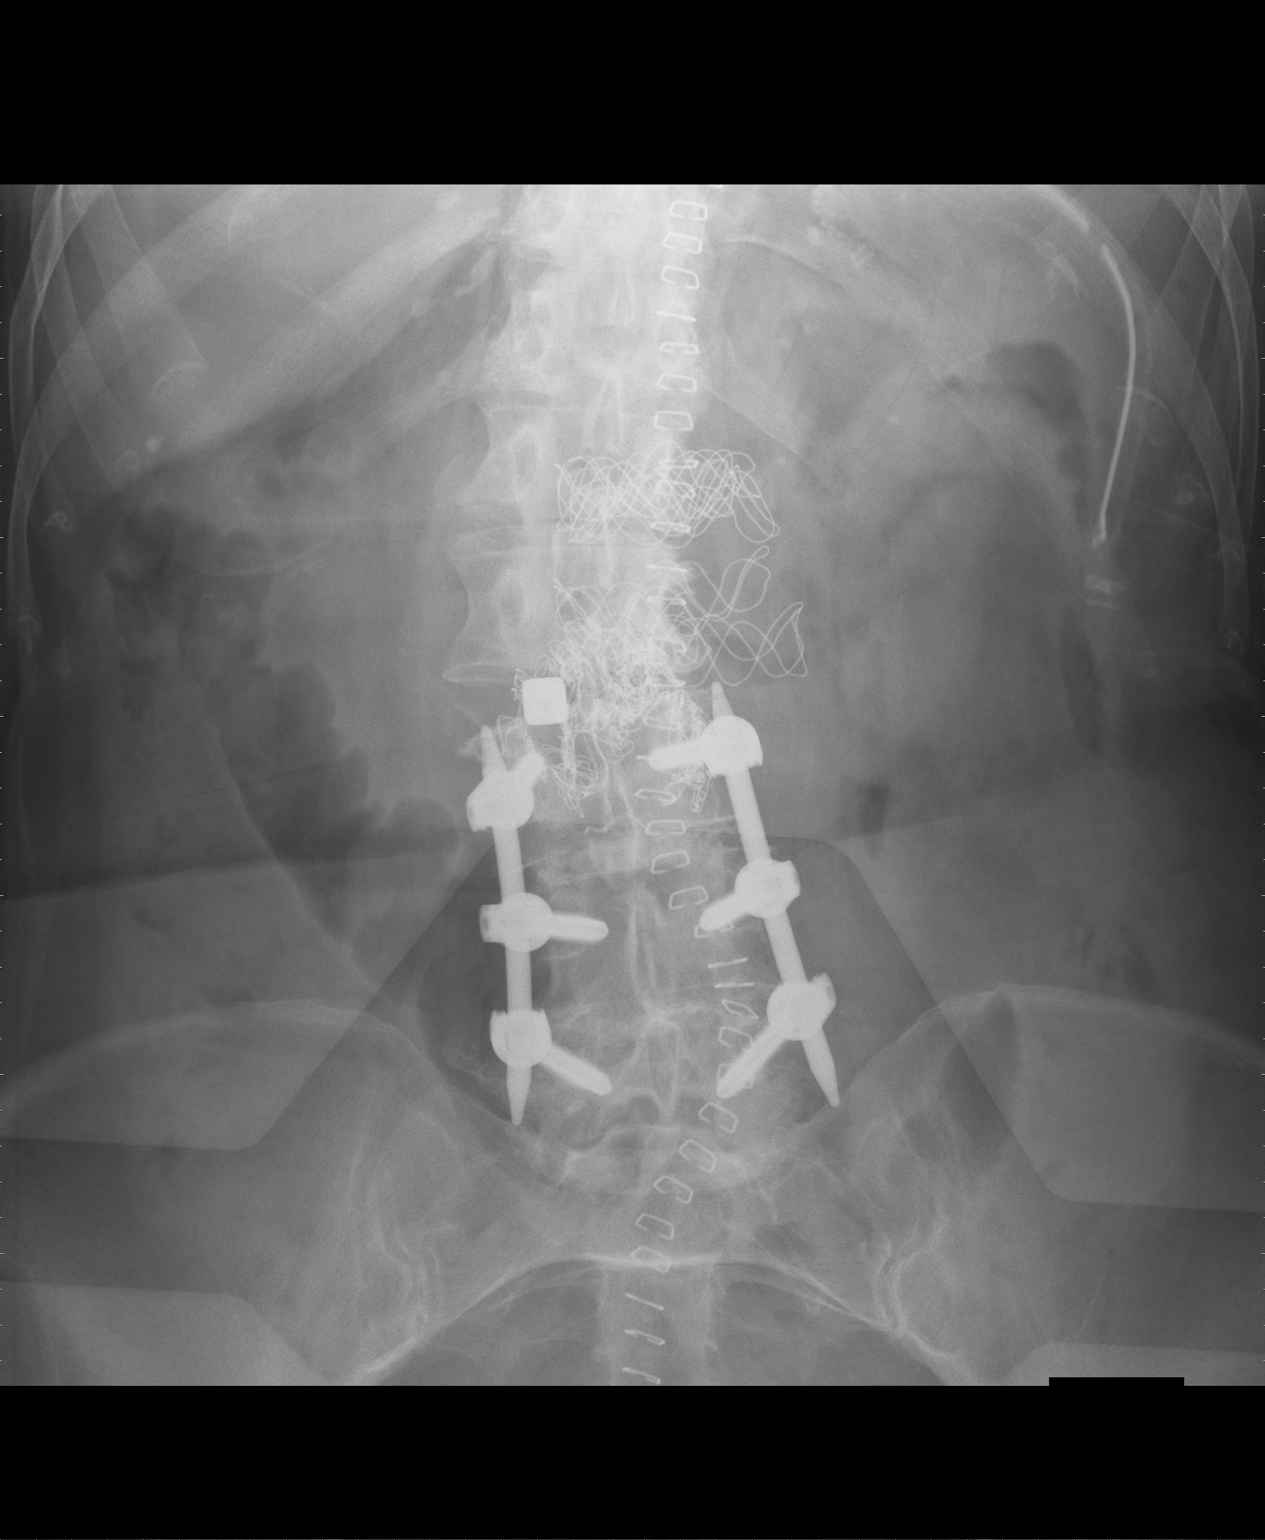

[1 of 1 positions shown; findings below may reference images not displayed]

FINDINGS: There are no radiopaque foreign bodies.  A square shaped
metallic structure projecting over the L2-3 interspace is
consistent with a spacer.  Curvilinear metallic structures
projecting over the upper lumbar spine presumably represent sponge
packing.  Surgical skin staples are seen in the midline.
Nasogastric tube terminates in the stomach.
IMPRESSION: No radiopaque foreign body.

## 2013-02-28 MED ORDER — ENSURE COMPLETE PO LIQD
237.0000 mL | ORAL | Status: DC
  Administered 2013-02-28 – 2013-03-01 (×2): 237 mL via ORAL

## 2013-02-28 MED ORDER — GUAIFENESIN 100 MG/5ML PO SYRP
200.0000 mg | ORAL_SOLUTION | ORAL | Status: DC | PRN
  Administered 2013-02-28 (×2): 200 mg via ORAL
  Filled 2013-02-28: qty 10

## 2013-02-28 NOTE — Evaluation (Signed)
Physical Therapy Evaluation Patient Details Name: Jean [name] MRN: [id number] DOB: August 06, 1928 Today's Date: 02/28/2013 Time: 0922-1006 PT Time Calculation (min): 44 min  PT Assessment / Plan / Recommendation History of Present Illness  Patient is an 79 yo female admitted with frequent falls (bruising to rt hip and posterior head), and recent UTI.  recent DC from snf.  Clinical Impression  Pt is very weak, incontinent of bladder, did have BM on BSC. Pt will benefit from PT to address problems listed. Pt recently DC from SNF.    PT Assessment  Patient needs continued PT services    Follow Up Recommendations  SNF    Does the patient have the potential to tolerate intense rehabilitation      Barriers to Discharge        Equipment Recommendations  None recommended by PT    Recommendations for Other Services     Frequency Min 3X/week    Precautions / Restrictions Precautions Precautions: Fall Precaution Comments: incontinence   Pertinent Vitals/Pain Pt was clammy, RN aware. BP 165/90 ish. Pt assisted back to bed. Pt also had dry heaves several times.      Mobility  Bed Mobility Bed Mobility: Rolling Right;Rolling Left;Right Sidelying to Sit Rolling Right: 3: Mod assist Rolling Left: 3: Mod assist Right Sidelying to Sit: 2: Max assist Transfers Transfers: Sit to Stand;Stand to Sit;Stand Pivot Transfers Sit to Stand: 1: +2 Total assist;From bed;From chair/3-in-1 Sit to Stand: Patient Percentage: 30% Stand to Sit: 1: +2 Total assist;To bed;To chair/3-in-1 Stand to Sit: Patient Percentage: 30% Stand Pivot Transfers: 1: +2 Total assist Stand Pivot Transfers: Patient Percentage: 30% Details for Transfer Assistance: pt unable to stand erect enough to have stedy flaps placed down, 2 person pivot assist to [facility]. Then stood with 2 person arm support to move [facility] and bed brought up.  Ambulation/Gait Ambulation/Gait Assistance: Not tested (comment)    Exercises     PT  Diagnosis: Generalized weakness  PT Problem List: Decreased strength;Decreased range of motion;Decreased activity tolerance;Decreased balance;Decreased mobility;Decreased knowledge of precautions;Decreased safety awareness;Decreased knowledge of use of DME PT Treatment Interventions: DME instruction;Functional mobility training;Therapeutic activities;Therapeutic exercise;Patient/family education     PT Goals(Current goals can be found in the care plan section) Acute Rehab PT Goals Patient Stated Goal: none stated. PT Goal Formulation: Patient unable to participate in goal setting Time For Goal Achievement: 03/14/13 Potential to Achieve Goals: Fair  Visit Information  Last PT Received On: 02/28/13 Assistance Needed: +2 History of Present Illness: Patient is an 79 yo female admitted with frequent falls (bruising to rt hip and posterior head), and recent UTI.  recent DC from snf.       Prior Functioning  Home Living Family/patient expects to be discharged to:: Skilled nursing facility Living Arrangements: Spouse/significant other Prior Function Level of Independence: Needs assistance Gait / Transfers Assistance Needed: Uses RW - husband assists patient for safety Comments: Recent significant decline with mobility/strength resulting in multiple falls.  Today patient incontinent of bowel and bladder - unaware. after assisted to [facility]. Communication Communication: HOH    Cognition  Cognition Arousal/Alertness: Awake/alert Overall Cognitive Status: No family/caregiver present to determine baseline cognitive functioning    Extremity/Trunk Assessment Upper Extremity Assessment Upper Extremity Assessment: Generalized weakness;LUE deficits/detail RUE Deficits / Details: decreased ability to reach up to stedy bar. Lower Extremity Assessment Lower Extremity Assessment: Generalized weakness   Balance Balance Balance Assessed: Yes Static Sitting Balance Static Sitting - Balance Support:  Bilateral upper extremity supported;Feet supported  Static Sitting - Level of Assistance: 3: Mod assist  End of Session PT - End of Session Activity Tolerance: Patient limited by fatigue Patient left: in bed;with call bell/phone within reach;with bed alarm set Nurse Communication: Mobility status  GP     Rada Hay 02/28/2013, 11:55 AM Blanchard Kelch PT [phone]

## 2013-02-28 NOTE — Progress Notes (Signed)
[facility] PROGRESS NOTE  Jean [name] WJX:[id number] DOB: [birth year] DOA: 02/26/2013 PCP: Sonda Primes, MD  Brief narrative: 79 year old female with past medical history of alzheimer's dementia, hypothyroidism, hypertension who presented to [facility] ED 02/26/2013 with complaints of worsening flank pain for few days prior to this admission. Additionally, she experienced poor oral intake, failure to thrive and weakness.  In ED, her T max was 102.1 F but the rest of her vital signs were stable. She was found to have UTI based on urinalysis report. Her CXR was significant for cardiomegaly but no CHF or pneumonia. CT head showed multiple chronic ischemic changes but no acute intracranial findings. Pt was started on empiric treatment with rocephin for UTI.   Assessment/Plan:   Principal Problem:  UTI (lower urinary tract infection)  - on rocephin IV daily  - follow up urine culture results - re incubated for better growth  - had fever overnight, 100.5 F Active Problems:  HYPOTHYROIDISM, UNSPECIFIED  - continue synthroid  HYPERTENSION  - continue Norvasc and Metoprolol  CORONARY ARTERY DISEASE  - continue aspirin and plavix  - also used for secondary stroke prophylaxis  ARF (acute renal failure)  - likely dehydration and UTI  - will continue IV fluids - follow up BMP in am Depression  - continue prozac  Alzheimer's disease and vascular dementia  - multiple previous infarct identified on CT head but no acute intracranial findings  - continue aspirin and plavix   Code Status: full code  Family Communication: no family at the bedside; updated the family over the phone 12/28 Disposition Plan: to SNF per PT recommendations  Consultants:  None  Procedures:  None  Antibiotics:  Rocephin 02/26/2013 -->   [name], MD  [facility] Pager [phone]  If 7PM-7AM, please contact night-coverage [email or web address] Password [email or web address] 02/28/2013, 1:23 PM   LOS: 2 days     HPI/Subjective: No acute overnight events.   Objective: Filed Vitals:   02/27/13 0509 02/27/13 1445 02/27/13 2041 02/28/13 0412  BP: 135/55 121/53 142/54 137/51  Pulse: 65 66 75 66  Temp: 99.8 F (37.7 C) 100.5 F (38.1 C) 99.3 F (37.4 C) 99.8 F (37.7 C)  TempSrc: Oral Oral Oral Oral  Resp: 16 16 18 16   Height:      Weight:      SpO2: 93% 91% 94% 91%    Intake/Output Summary (Last 24 hours) at 02/28/13 1323 Last data filed at 02/28/13 0600  Gross per 24 hour  Intake   1240 ml  Output      0 ml  Net   1240 ml    Exam:   General:  Pt is alert,  not in acute distress  Cardiovascular: Regular rate and rhythm, S1/S2 appreciated   Respiratory: Clear to auscultation bilaterally, no wheezing  Abdomen: Soft, non tender, non distended, bowel sounds present, no guarding  Extremities: No edema, pulses DP and PT palpable bilaterally  Neuro: Grossly nonfocal  Data Reviewed: Basic Metabolic Panel:  Recent Labs Lab 02/26/13 1920 02/27/13 0445  NA 137 135  K 4.1 3.7  CL 102 101  CO2 23 23  GLUCOSE 125* 103*  BUN 21 19  CREATININE 1.54* 1.52*  CALCIUM 9.9 9.2   Liver Function Tests:  Recent Labs Lab 02/26/13 1920  AST 22  ALT 10  ALKPHOS 85  BILITOT 0.3  PROT 7.8  ALBUMIN 3.6    Recent Labs Lab 02/26/13 1920  LIPASE 50   No results found for this  basename: AMMONIA,  in the last 168 hours CBC:  Recent Labs Lab 02/26/13 1920 02/27/13 0445  WBC 10.3 6.6  NEUTROABS 8.3*  --   HGB 12.5 11.4*  HCT 36.9 34.4*  MCV 86.6 87.1  PLT 224 203   Cardiac Enzymes: No results found for this basename: CKTOTAL, CKMB, CKMBINDEX, TROPONINI,  in the last 168 hours BNP: No components found with this basename: POCBNP,  CBG: No results found for this basename: GLUCAP,  in the last 168 hours  URINE CULTURE     Status: None   Collection Time    02/26/13  6:56 PM      Result Value Range Status   Specimen Description URINE, CATHETERIZED   Final    Special Requests NONE   Final   Culture  Setup Time     Final   Value: 02/27/2013 03:31     Performed at [facility] Count PENDING   Incomplete   Culture     Final   Value: Culture reincubated for better growth     Performed at [facility]   Report Status PENDING   Incomplete     Studies: Dg Chest 2 View 02/26/2013   IMPRESSION: Cardiomegaly without CHF or pneumonia.   Electronically Signed   By: Ruel Favors M.D.   On: 02/26/2013 20:59   Ct Head Wo Contrast 02/26/2013    IMPRESSION: No acute intracranial abnormalities. Stable appearance from the prior head CT.  Atrophy, advanced chronic microvascular ischemic change and multiple old infarcts.   Electronically Signed   By: Amie Portland M.D.   On: 02/26/2013 21:12    Scheduled Meds: . amLODipine  5 mg Oral Daily  . antiseptic oral rinse  15 mL Mouth Rinse BID  . aspirin EC  81 mg Oral Daily  . cefTRIAXone  1 g Intravenous Q24H  . clopidogrel  75 mg Oral Daily  . FLUoxetine  10 mg Oral Daily  . levothyroxine  75 mcg Oral QAC breakfast  . metoprolol  150 mg Oral BID  . pantoprazole  40 mg Oral Daily  . vitamin B-12  100 mcg Oral Daily   Continuous Infusions: . dextrose 5 % and 0.9% NaCl 75 mL/hr at 02/28/13 7846

## 2013-02-28 NOTE — Progress Notes (Signed)
Clinical Social Work Department BRIEF PSYCHOSOCIAL ASSESSMENT 02/28/2013  Patient:  Jean [name], Jean [name]     Account Number:  [id number]     Admit date:  02/26/2013  Clinical Social Worker:  Dennison Bulla  Date/Time:  02/28/2013 03:40 PM  Referred by:  Physician  Date Referred:  02/28/2013 Referred for  SNF Placement   Other Referral:   Interview type:  Patient Other interview type:    PSYCHOSOCIAL DATA Living Status:  FAMILY Admitted from facility:   Level of care:   Primary support name:  Jean [name] Primary support relationship to patient:  SPOUSE Degree of support available:   Strong    CURRENT CONCERNS Current Concerns  Post-Acute Placement   Other Concerns:    SOCIAL WORK ASSESSMENT / PLAN CSW received referral to complete assessment in order to assist with DC planning. CSW reviewed chart and met with patient at bedside. CSW introduced myself and explained role.    Patient reports she lives at home with husband and was doing well prior to admission. Patent states that she has been at SNF in the past and is unsure about DC plans. CSW provided SNF list and explained process but patient reports that husband assists with most decisions. CSW received permission to contact husband in order to talk about DC plans. CSW called and spoke with husband via phone who reports that patient was at [facility] for about 3 months and was recently DC about 2-3 weeks ago. Husband reports he and family have been discussing DC plans and feel that SNF is needed again. CSW explained process and husband agreeable to [facility] search.    CSW completed FL2 and faxed out. CSW sent information to [facility] for prior authorization. CSW will follow up with bed offers.   Assessment/plan status:  Psychosocial Support/Ongoing Assessment of Needs Other assessment/ plan:   Information/referral to community resources:   SNF list    PATIENT'S/FAMILY'S RESPONSE TO PLAN OF CARE: Patient alert but struggled  with fully participating in assessment. Patient is unsure about DC plans and feels that husband needs to assist with decision making. Husband thanked CSW for call and feels that SNF placement would be best for patient.       Unk Lightning, LCSW  (Coverage for [employer])

## 2013-02-28 NOTE — Progress Notes (Signed)
INITIAL NUTRITION ASSESSMENT  DOCUMENTATION CODES Per approved criteria  -Not Applicable   INTERVENTION: Provide Carnation Instant Breakfast once daily Provide Ensure Complete once daily Encourage PO intake  NUTRITION DIAGNOSIS: Inadequate oral intake related to poor appetite as evidenced by 6% weight loss in 3 months.   Goal: Pt to meet >/= 90% of their estimated nutrition needs   Monitor:  PO intake Weight Labs  Reason for Assessment: Malnutrition Screening Tool, score of 4  79 y.o. female  Admitting Dx: UTI (lower urinary tract infection)  ASSESSMENT: 79 year old female with past medical history of alzheimer's dementia, hypothyroidism, hypertension who presented to [facility] ED 02/26/2013 with complaints of worsening flank pain for few days prior to this admission. Additionally, she experienced poor oral intake, failure to thrive and weakness.   Pt a little confused at time of visit but, able to answer questions. Pt states she doesn't feel like eating and she reports poor appetite PTA. Pt usually weighs 157 lbs. Per nurse tech pt did not want to eat any breakfast and requested carnation instant breakfast which pt reports drinking daily PTA.  Encouraged pt to eat 3 meals daily and to continue use of nutritional supplements to prevent further weight loss.   Height: Ht Readings from Last 1 Encounters:  02/26/13 5' 7.5" (1.715 m)    Weight: Wt Readings from Last 1 Encounters:  02/26/13 148 lb 9.4 oz (67.4 kg)    Ideal Body Weight: 138 lbs  % Ideal Body Weight: 107%  Wt Readings from Last 10 Encounters:  02/26/13 148 lb 9.4 oz (67.4 kg)  11/28/12 157 lb 10.1 oz (71.5 kg)  11/17/12 166 lb (75.297 kg)  08/12/12 157 lb (71.215 kg)  06/02/12 159 lb (72.122 kg)  05/27/12 159 lb (72.122 kg)  05/18/12 157 lb (71.215 kg)  05/18/12 159 lb (72.122 kg)  04/28/12 150 lb (68.04 kg)  04/28/12 150 lb (68.04 kg)    Usual Body Weight: 157 lbs  % Usual Body Weight: 94%  BMI:   Body mass index is 22.92 kg/(m^2).  Estimated Nutritional Needs: Kcal: 1500-1700 Protein: 70-80 grams  Fluid: 1.5-1.7 L/day  Skin: WDL  Diet Order: General  EDUCATION NEEDS: -No education needs identified at this time   Intake/Output Summary (Last 24 hours) at 02/28/13 1109 Last data filed at 02/28/13 0600  Gross per 24 hour  Intake   1240 ml  Output      0 ml  Net   1240 ml    Last BM: PTA   Labs:   Recent Labs Lab 02/26/13 1920 02/27/13 0445  NA 137 135  K 4.1 3.7  CL 102 101  CO2 23 23  BUN 21 19  CREATININE 1.54* 1.52*  CALCIUM 9.9 9.2  GLUCOSE 125* 103*    CBG (last 3)  No results found for this basename: GLUCAP,  in the last 72 hours  Scheduled Meds: . amLODipine  5 mg Oral Daily  . antiseptic oral rinse  15 mL Mouth Rinse BID  . aspirin EC  81 mg Oral Daily  . cefTRIAXone (ROCEPHIN)  IV  1 g Intravenous Q24H  . clopidogrel  75 mg Oral Daily  . FLUoxetine  10 mg Oral Daily  . levothyroxine  75 mcg Oral QAC breakfast  . metoprolol  150 mg Oral BID  . pantoprazole  40 mg Oral Daily  . vitamin B-12  100 mcg Oral Daily    Continuous Infusions: . dextrose 5 % and 0.9% NaCl 75 mL/hr  at 02/28/13 1610    Past Medical History  Diagnosis Date  . Hyperlipidemia   . Hypothyroid     hypo  . CHF (congestive heart failure)   . Mitral regurgitation   . Tricuspid regurgitation   . Anemia   . Arthritis     Osteoarthtistis  . Elevated glucose   . CVA (cerebral infarction)   . Vitamin D deficiency   . Myocardial infarction   . Chronic kidney failure     stage 3  . CAD (coronary artery disease)     sees Dr. Ricka Burdock last 03/2011  . Hypertension     Dr. Eden Emms cardiologist, Dr. Cheron Schaumann primary doctor    Past Surgical History  Procedure Laterality Date  . Abdominal hysterectomy    . Back surgery    . Coronary stent placement    . Laparotomy  06/23/2011    Procedure: EXPLORATORY LAPAROTOMY;  Surgeon: Barnett Abu, MD;  Location: [facility]UR[facility]  [facility];  Service: Neurosurgery;  Laterality: Bilateral;  . Esophagogastroduodenoscopy N/A 04/28/2012    Procedure: ESOPHAGOGASTRODUODENOSCOPY (EGD);  Surgeon: Hart Carwin, MD;  Location: [facility];  Service: Endoscopy;  Laterality: N/A;    Ian Malkin RD, LDN Inpatient Clinical Dietitian Pager: [phone] After Hours Pager: [phone]

## 2013-02-28 NOTE — Progress Notes (Addendum)
Clinical Social Work Department CLINICAL SOCIAL WORK PLACEMENT NOTE 02/28/2013  Patient:  Jean [name], Jean [name]  Account Number:  [id number] Admit date:  02/26/2013  Clinical Social Worker:  Unk Lightning, LCSW  Date/time:  02/28/2013 03:40 PM  Clinical Social Work is seeking post-discharge placement for this patient at the following level of care:   SKILLED NURSING   (*CSW will update this form in Epic as items are completed)   02/28/2013  Patient/family provided with [facility] Department of Clinical Social Work's list of facilities offering this level of care within the geographic area requested by the patient (or if unable, by the patient's family).  02/28/2013  Patient/family informed of their freedom to choose among providers that offer the needed level of care, that participate in Medicare, Medicaid or managed care program needed by the patient, have an available bed and are willing to accept the patient.  02/28/2013  Patient/family informed of [facility]' ownership interest in [facility], as well as of the fact that they are under no obligation to receive care at this facility.  PASARR submitted to EDS on existing # PASARR number received from EDS on   FL2 transmitted to all facilities in geographic area requested by pt/family on  02/28/2013 FL2 transmitted to all facilities within larger geographic area on   Patient informed that his/her managed care company has contracts with or will negotiate with  certain facilities, including the following:     Patient/family informed of bed offers received:  03/01/2013 Patient chooses bed at [facility] Physician recommends and patient chooses bed at    Patient to be transferred to  on  [facility] on 03/02/2013 Patient to be transferred to facility by [facility]  The following physician request were entered in Epic:   Additional Comments:

## 2013-03-01 LAB — URINE CULTURE: Colony Count: >=100000

## 2013-03-01 LAB — BASIC METABOLIC PANEL
CO2: 23 mEq/L (ref 19–32)
Calcium: 8.6 mg/dL (ref 8.4–10.5)
Creatinine, Ser: 1.31 mg/dL — ABNORMAL HIGH (ref 0.50–1.10)
Glucose, Bld: 108 mg/dL — ABNORMAL HIGH (ref 70–99)
Sodium: 138 mEq/L (ref 137–147)

## 2013-03-01 NOTE — Progress Notes (Signed)
[facility] PROGRESS NOTE  Jean [name] ZOX:[id number] DOB: February 01, 1929 DOA: 02/26/2013 PCP: Sonda Primes, MD  Brief narrative: 79 year old female with past medical history of alzheimer's dementia, hypothyroidism, hypertension who presented to [facility] ED 02/26/2013 with complaints of worsening flank pain for few days prior to this admission. Additionally, she experienced poor oral intake, failure to thrive and weakness.  In ED, her T max was 102.1 F but the rest of her vital signs were stable. She was found to have UTI based on urinalysis report. Her CXR was significant for cardiomegaly but no CHF or pneumonia. CT head showed multiple chronic ischemic changes but no acute intracranial findings. Pt was started on empiric treatment with rocephin for UTI.   Assessment/Plan:   Principal Problem:  UTI (lower urinary tract infection)  - on rocephin IV daily  - urine culture - re incubated for better growth but again multiple morphologies none predominant  Active Problems:  HYPOTHYROIDISM, UNSPECIFIED  - continue synthroid  HYPERTENSION  - continue Norvasc and Metoprolol  CORONARY ARTERY DISEASE  - continue aspirin and plavix  - also used for secondary stroke prophylaxis  ARF (acute renal failure)  - likely dehydration and UTI  - D/C IV fluids  - creatinine 1.31 this am, improving  Depression  - continue prozac  Alzheimer's disease and vascular dementia  - multiple previous infarct identified on CT head but no acute intracranial findings  - continue aspirin and plavix   Code Status: full code  Family Communication: no family at the bedside; updated the family over the phone 12/28  Disposition Plan: to SNF per PT recommendations; awaiting bed palcement  Consultants:  None  Procedures:  None  Antibiotics:  Rocephin 02/26/2013 -->   [name], MD  [facility] Pager [phone]  If 7PM-7AM, please contact night-coverage [email or web address] Password [email or web address] 03/01/2013, 1:44  PM   LOS: 3 days   HPI/Subjective: No acute overnight events.   Objective: Filed Vitals:   02/28/13 2106 02/28/13 2107 02/28/13 2109 03/01/13 0557  BP: 139/51 139/59  101/45  Pulse: 59 59 62 90  Temp: 99.4 F (37.4 C)   98.4 F (36.9 C)  TempSrc: Oral   Oral  Resp: 18   18  Height:      Weight:      SpO2: 93%   94%    Intake/Output Summary (Last 24 hours) at 03/01/13 1344 Last data filed at 03/01/13 0558  Gross per 24 hour  Intake 1042.5 ml  Output      0 ml  Net 1042.5 ml    Exam:   General:  Pt is alert, no acute distress  Cardiovascular: Regular rate and rhythm, S1/S2 appreciated  Respiratory: Clear to auscultation bilaterally, no wheezing, no crackles, no rhonchi  Abdomen: Soft, non tender, non distended, bowel sounds present, no guarding  Extremities: No edema, pulses DP and PT palpable bilaterally  Neuro: Grossly nonfocal  Data Reviewed: Basic Metabolic Panel:  Recent Labs Lab 02/26/13 1920 02/27/13 0445 03/01/13 0419  NA 137 135 138  K 4.1 3.7 3.6*  CL 102 101 103  CO2 23 23 23   GLUCOSE 125* 103* 108*  BUN 21 19 14   CREATININE 1.54* 1.52* 1.31*  CALCIUM 9.9 9.2 8.6   Liver Function Tests:  Recent Labs Lab 02/26/13 1920  AST 22  ALT 10  ALKPHOS 85  BILITOT 0.3  PROT 7.8  ALBUMIN 3.6    Recent Labs Lab 02/26/13 1920  LIPASE 50   No results found  for this basename: AMMONIA,  in the last 168 hours CBC:  Recent Labs Lab 02/26/13 1920 02/27/13 0445  WBC 10.3 6.6  NEUTROABS 8.3*  --   HGB 12.5 11.4*  HCT 36.9 34.4*  MCV 86.6 87.1  PLT 224 203   Cardiac Enzymes: No results found for this basename: CKTOTAL, CKMB, CKMBINDEX, TROPONINI,  in the last 168 hours BNP: No components found with this basename: POCBNP,  CBG: No results found for this basename: GLUCAP,  in the last 168 hours  Recent Results (from the past 240 hour(s))  URINE CULTURE     Status: None   Collection Time    02/26/13  6:56 PM      Result Value  Range Status   Specimen Description URINE, CATHETERIZED   Final   Special Requests NONE   Final   Culture  Setup Time     Final   Value: 02/27/2013 03:31     Performed at [facility] Count     Final   Value: >=100,000 COLONIES/ML     Performed at [facility]   Culture     Final   Value: Multiple bacterial morphotypes present, none predominant. Suggest appropriate recollection if clinically indicated.     Performed at [facility]   Report Status 03/01/2013 FINAL   Final     Studies: No results found.  Scheduled Meds: . amLODipine  5 mg Oral Daily  . aspirin EC  81 mg Oral Daily  . cefTRIAXone  1 g Intravenous Q24H  . clopidogrel  75 mg Oral Daily  . FLUoxetine  10 mg Oral Daily  . levothyroxine  75 mcg Oral QAC breakfast  . metoprolol  150 mg Oral BID  . pantoprazole  40 mg Oral Daily  . vitamin B-12  100 mcg Oral Daily   Continuous Infusions: . dextrose 5 % and 0.9% NaCl 75 mL/hr at 02/28/13 4540

## 2013-03-01 NOTE — Progress Notes (Addendum)
CSW met with pt at bedside re: SNF bed offers.  Pt stated that she has previously been at [facility], but is unsure if she wants to return to facility. Pt asked CSW to contact pt husband to discuss bed offers.  CSW contacted pt husband via telephone. CSW provided pt husband bed offers. Pt husband stated that he would want to discuss bed offers with pt daughters. CSW was provided contact information for pt daughter, Marella Bile.   CSW contacted pt daughter, Marella Bile via telephone. CSW introduced self and explained role. CSW discussed with pt daughter SNF bed offers. Pt daughter plans to speak to pt other daughter in order to make decision re: SNF placement.  CSW made pt daughter aware that pt nearing being medically ready for discharge and decision is needed in order for facility to submit insurance authorization for SNF placement.   Pt insurance requires authorization for placement prior to pt discharge from hospital.  CSW provided pt daughter this CSW contact information in order for pt daughter to contact this CSW with decision.  Addendum: 10:18 am:  CSW spoke to pt daughter via telephone. CSW notified pt daughter that [facility] had initially offered pt a bed, but facility is not accepting new admissions due to the flu in the facility. Pt family was leaning toward [facility] and will have to look back at offers in order to attempt to make another decision. Pt family is interested in knowing if [facility] would be able to offer a bed. CSW contacted [facility] and left voice message for admissions coordinator requesting facility to review admission information.  CSW to follow up with pt family re: response from [facility]/decision for SNF.  CSW to continue to follow to assist with pt discharge planning needs.  Addendum 2:30pm:  CSW received notification from [facility] that facility is able to offer pt a bed.  CSW spoke with pt daughters and  pt daughters and pt are agreeable to [facility] for SNF.  CSW notified [facility] and facility is initiating insurance authorization through [place].  CSW to facilitate pt discharge needs when insurance authorization received which will likely be tomorrow morning.   Jacklynn Lewis, MSW, LCSW Clinical Social Work [phone]

## 2013-03-02 DIAGNOSIS — I2510 Atherosclerotic heart disease of native coronary artery without angina pectoris: Secondary | ICD-10-CM

## 2013-03-02 LAB — BASIC METABOLIC PANEL
BUN: 9 mg/dL (ref 6–23)
Calcium: 8.6 mg/dL (ref 8.4–10.5)
GFR calc Af Amer: 49 mL/min — ABNORMAL LOW (ref >90)
GFR calc non Af Amer: 43 mL/min — ABNORMAL LOW (ref >90)
Glucose, Bld: 115 mg/dL — ABNORMAL HIGH (ref 70–99)
Sodium: 134 mEq/L — ABNORMAL LOW (ref 137–147)

## 2013-03-02 IMAGING — CT CT HEAD W/O CM
1 of 2 series · 13 of 30 positions shown, 17 images · non-contrast
Comparison: Most recent head CT 10/03/2004.

CLINICAL DATA: Altered mental status with flat affect since lumbar
spine fusion surgery on 06/23/2011 complicated by avulsion of
lumbar artery requiring anterior repair. New right-sided neglect or
hemiparesis.

CT HEAD WITHOUT CONTRAST
TECHNIQUE: Contiguous axial images were obtained from the base of
the skull through the vertex without contrast.

[Series 2: brain · axial · 0.47mm/px · z∈[+119,+255]mm · 13 of 32 slices shown, 17 images]
[im 3/32  brain]
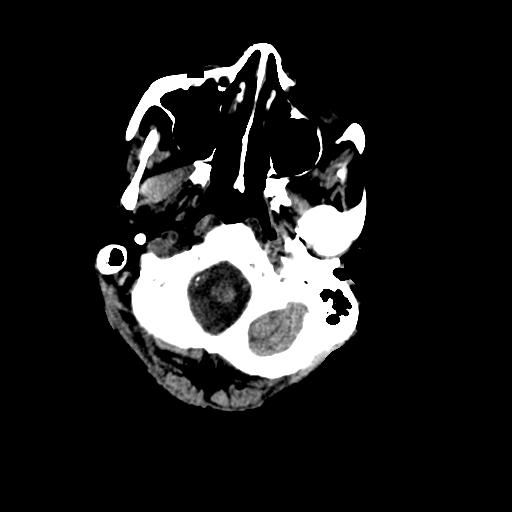
[im 3/32  bone]
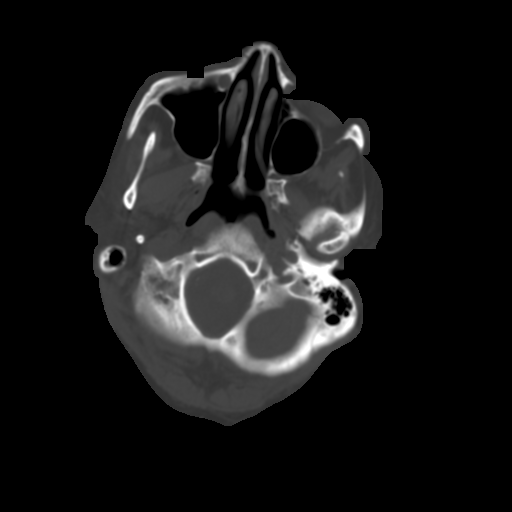
[im 5/32  brain]
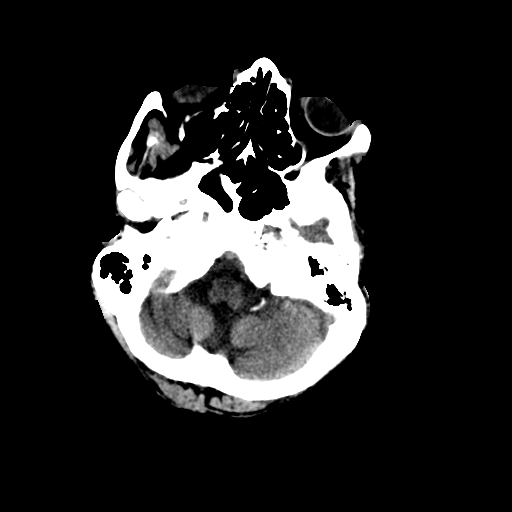
[im 7/32  brain]
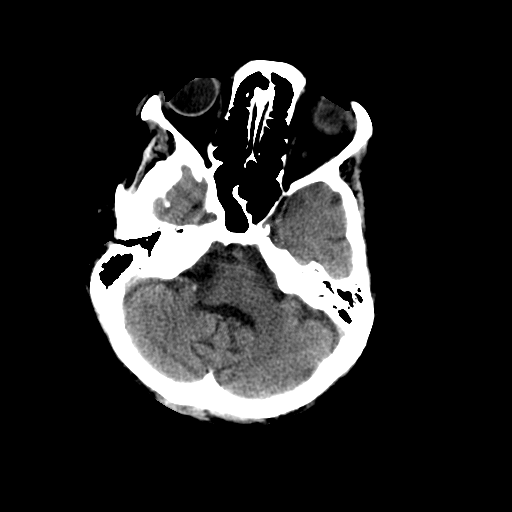
[im 9/32  brain]
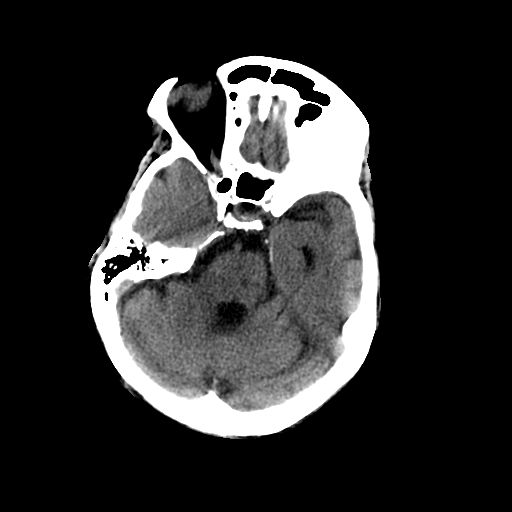
[im 12/32  brain]
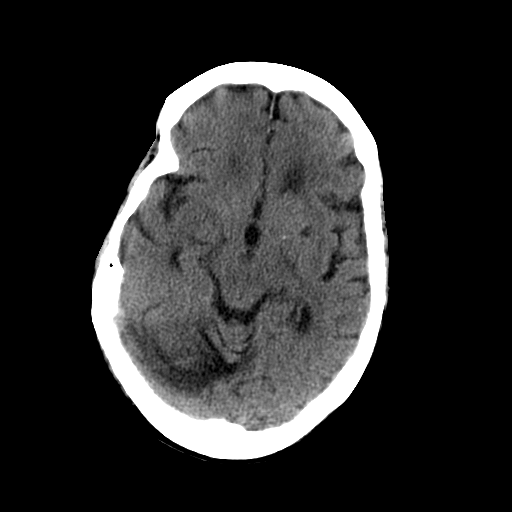
[im 12/32  bone]
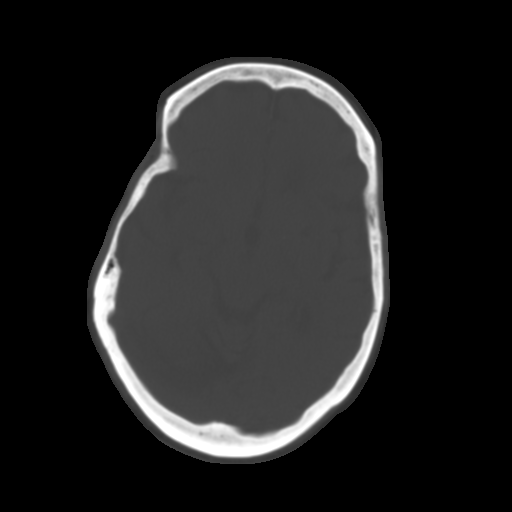
[im 14/32  brain]
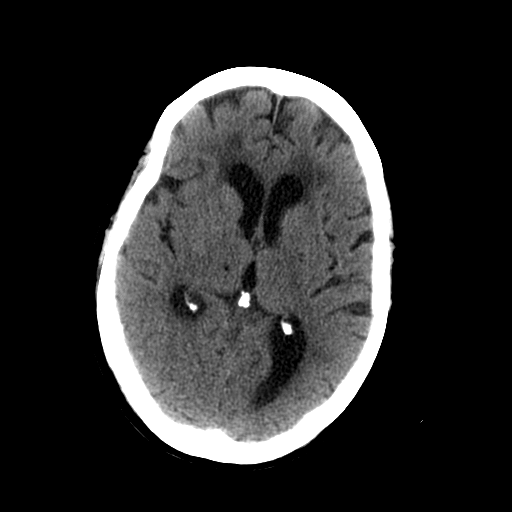
[im 16/32  brain]
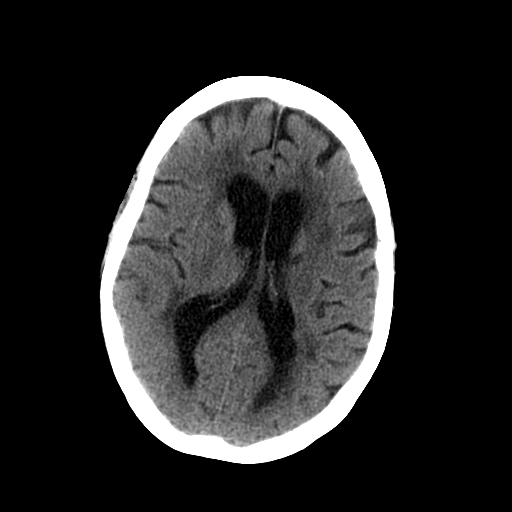
[im 18/32  brain]
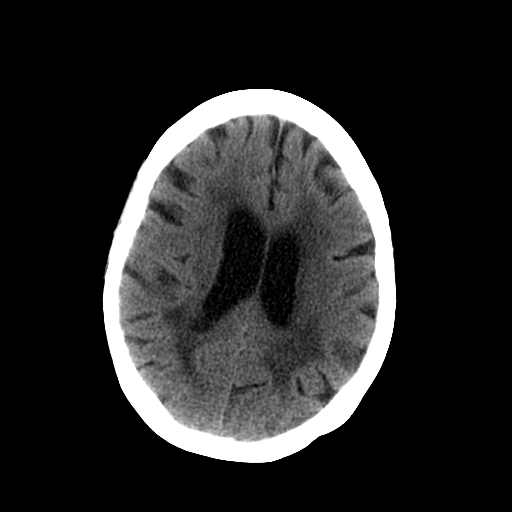
[im 20/32  brain]
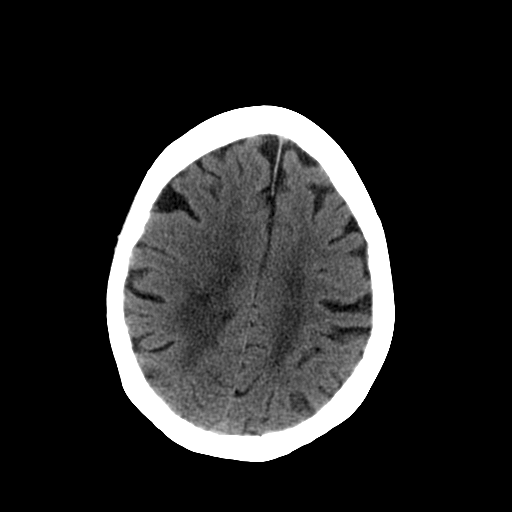
[im 20/32  bone]
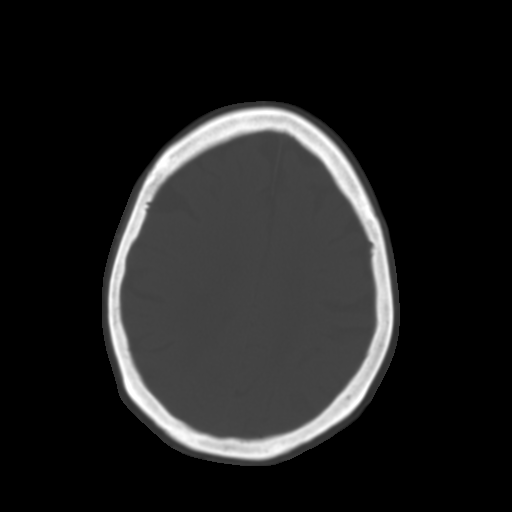
[im 23/32  brain]
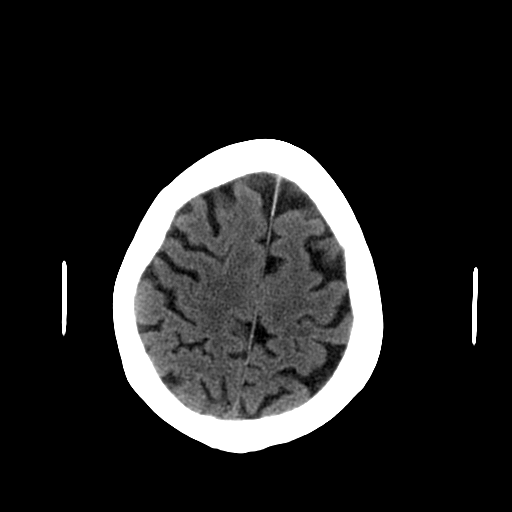
[im 25/32  brain]
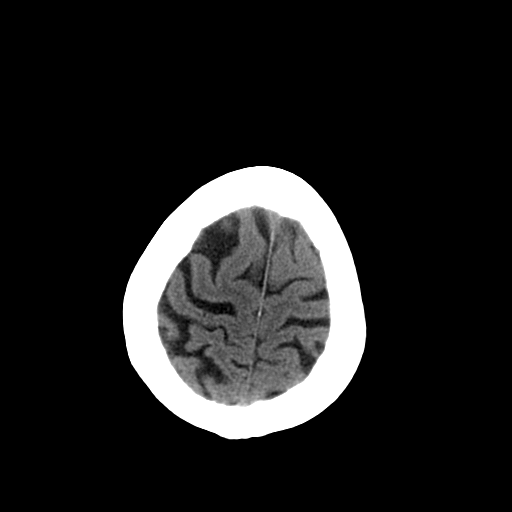
[im 27/32  brain]
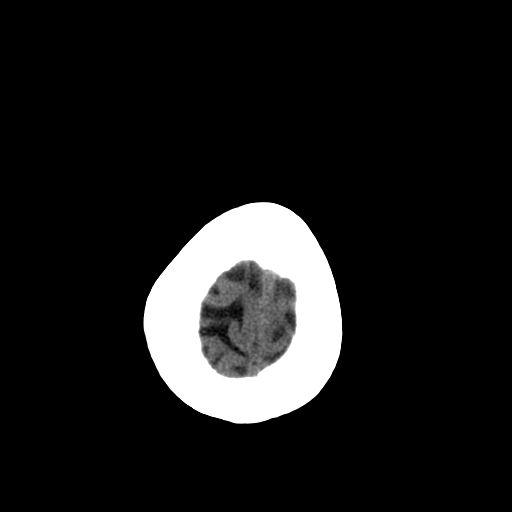
[im 29/32  brain]
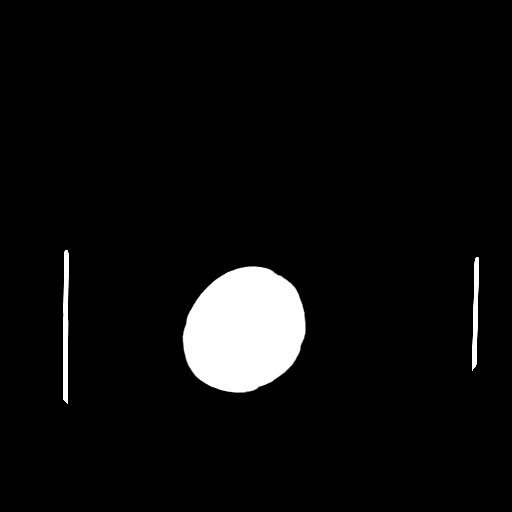
[im 29/32  bone]
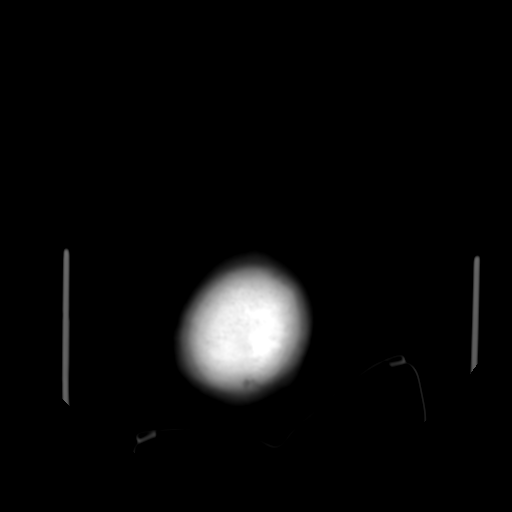

[13 of 30 positions shown; findings below may reference images not displayed]

FINDINGS: There is no evidence for acute infarction, intracranial
hemorrhage, mass lesion, hydrocephalus, or extra-axial fluid.  Mild
to moderate age related atrophy is present.  There is moderately
extensive chronic microvascular ischemic change.  Left basal
ganglia/periventricular white matter lacune appears chronic,
although has occurred since 8339. There has been generalized
progression of small vessel disease since 8339.  No definite acute
cortically based infarct although MRI could be helpful in further
evaluation.  Moderate cerebellar atrophy.  Calvarium intact.  No
acute sinus or mastoid disease.  Moderate vascular calcification
carotid siphons.
IMPRESSION: Progression of atrophy and small vessel disease is since 8339.  No
definite acute  infarct or hemorrhage.  MRI could be helpful in
further evaluation.

## 2013-03-02 MED ORDER — ASPIRIN 81 MG PO TBEC: 81.0000 mg | DELAYED_RELEASE_TABLET | Freq: Every day | ORAL | Status: AC

## 2013-03-02 MED ORDER — ENSURE COMPLETE PO LIQD: 237.0000 mL | ORAL | Status: DC

## 2013-03-02 MED ORDER — FLUOXETINE HCL 10 MG PO TABS: 10.0000 mg | ORAL_TABLET | Freq: Every day | ORAL | Status: DC

## 2013-03-02 MED ORDER — BIOTENE DRY MOUTH MT LIQD: 15.0000 mL | Freq: Two times a day (BID) | OROMUCOSAL | Status: DC

## 2013-03-02 MED ORDER — AMLODIPINE BESYLATE 5 MG PO TABS: 5.0000 mg | ORAL_TABLET | Freq: Every day | ORAL | Status: AC

## 2013-03-02 NOTE — Discharge Summary (Signed)
Physician Discharge Summary  Jean [name] ZOX:[id number] DOB: [birth year] DOA: 02/26/2013  PCP: Sonda Primes, MD  Admit date: 02/26/2013 Discharge date: 03/02/2013  Recommendations for Outpatient Follow-up:  1. Pt has received 5 days of rocephin and there is no need for antibiotics on discharge. She was treated for UTI. 2. Hold losartan due to renal insufficiency 3. May continue metoprolol 150 mg BID, may tolerate SBP as high as 155-155 4. Continue aspirin and plavix and monitor for any signs of bleed   Discharge Diagnoses:  Principal Problem:   UTI (lower urinary tract infection) Active Problems:   HYPOTHYROIDISM, UNSPECIFIED   HYPERTENSION   CORONARY ARTERY DISEASE   ARF (acute renal failure)   Depression   Alzheimer's disease    Discharge Condition: medically stable for discharge to SNF today  Diet recommendation: as tolerated   History of present illness:  79 year old female with past medical history of alzheimer's dementia, hypothyroidism, hypertension who presented to [facility] ED 02/26/2013 with complaints of worsening flank pain for few days prior to this admission. Additionally, she experienced poor oral intake, failure to thrive and weakness.  In ED, her T max was 102.1 F but the rest of her vital signs were stable. She was found to have UTI based on urinalysis report. Her CXR was significant for cardiomegaly but no CHF or pneumonia. CT head showed multiple chronic ischemic changes but no acute intracranial findings. Pt was started on empiric treatment with rocephin for UTI.   Assessment/Plan:   Principal Problem:  UTI (lower urinary tract infection)  - on rocephin IV daily  - urine culture - re incubated for better growth but again multiple morphologies none predominant  Active Problems:  HYPOTHYROIDISM, UNSPECIFIED  - continue synthroid  HYPERTENSION  - continue Norvasc and Metoprolol  - hold losartan due to renal insufficiency  CORONARY ARTERY DISEASE  -  continue aspirin and plavix  - also used for secondary stroke prophylaxis  ARF (acute renal failure)  - likely dehydration and UTI and losartan - losartan on hold - creatinine improving  Depression  - continue prozac  Alzheimer's disease and vascular dementia  - multiple previous infarct identified on CT head but no acute intracranial findings  - continue aspirin and plavix   Code Status: full code  Family Communication: no family at the bedside; updated the family over the phone 12/28  Disposition Plan: to SNF today  Consultants:  None  Procedures:  None  Antibiotics:  Rocephin 02/26/2013 --> 03/02/2013    Signed:  [name], MD  [facility] 03/02/2013, 12:06 PM  Pager #: [phone]   Discharge Exam: Filed Vitals:   03/02/13 0706  BP: 155/66  Pulse: 66  Temp: 98.4 F (36.9 C)  Resp: 20   Filed Vitals:   03/01/13 0557 03/01/13 1400 03/01/13 2103 03/02/13 0706  BP: 101/45 140/60 147/66 155/66  Pulse: 90 100 70 66  Temp: 98.4 F (36.9 C) 97.9 F (36.6 C) 98.2 F (36.8 C) 98.4 F (36.9 C)  TempSrc: Oral Oral Oral Oral  Resp: 18 18 20 20   Height:      Weight:      SpO2: 94% 94% 97% 95%    Exam:  General: Pt is alert, no acute distress  Cardiovascular: Regular rate and rhythm, S1/S2 appreciated  Respiratory: Clear to auscultation bilaterally, no wheezing, no crackles, no rhonchi  Abdomen: Soft, non tender, non distended, bowel sounds present, no guarding  Extremities: No edema, pulses DP and PT palpable bilaterally  Neuro: Grossly nonfocal   Discharge Instructions  Discharge Orders   Future Orders Complete By Expires   Call MD for:  difficulty breathing, headache or visual disturbances  As directed    Call MD for:  persistant dizziness or light-headedness  As directed    Call MD for:  persistant nausea and vomiting  As directed    Call MD for:  severe uncontrolled pain  As directed    Diet - low sodium heart healthy  As directed     Discharge instructions  As directed    Comments:     1. Pt has received 5 days of rocephin and there is no need for antibiotics on discharge. She was treated for UTI. 2. Hold losartan due to renal insufficiency 3. May continue metoprolol 150 mg BID, may tolerate SBP as high as 155-155 4. Continue aspirin and plavix and monitor for any signs of bleed   Increase activity slowly  As directed        Medication List    STOP taking these medications       losartan 100 MG tablet  Commonly known as:  COZAAR      TAKE these medications       amLODipine 5 MG tablet  Commonly known as:  NORVASC  Take 1 tablet (5 mg total) by mouth daily.     antiseptic oral rinse Liqd  15 mLs by Mouth Rinse route 2 (two) times daily.     aspirin 81 MG EC tablet  Take 1 tablet (81 mg total) by mouth daily.     cholecalciferol 1000 UNITS tablet  Commonly known as:  VITAMIN D  Take 1,000 Units by mouth daily.     clopidogrel 75 MG tablet  Commonly known as:  PLAVIX  Take 1 tablet (75 mg total) by mouth daily.     CO Q 10 PO  Take 1 tablet by mouth daily.     ergocalciferol 50000 UNITS capsule  Commonly known as:  VITAMIN D2  Take 1 capsule (50,000 Units total) by mouth once a week.     feeding supplement (ENSURE COMPLETE) Liqd  Take 237 mLs by mouth daily.     FLUoxetine 10 MG tablet  Commonly known as:  PROZAC  Take 1 tablet (10 mg total) by mouth daily.     levothyroxine 75 MCG tablet  Commonly known as:  SYNTHROID, LEVOTHROID  Take 1 tablet (75 mcg total) by mouth daily.     meclizine 25 MG tablet  Commonly known as:  ANTIVERT  Take 1 tablet (25 mg total) by mouth 3 (three) times daily as needed.     metoprolol 100 MG tablet  Commonly known as:  LOPRESSOR  Take 1.5 tablets (150 mg total) by mouth 2 (two) times daily.     nitroGLYCERIN 0.4 MG SL tablet  Commonly known as:  NITROSTAT  Place 0.4 mg under the tongue every 5 (five) minutes as needed. For chest pain     omeprazole  20 MG capsule  Commonly known as:  PRILOSEC  Take 1 capsule (20 mg total) by mouth daily.     pravastatin 20 MG tablet  Commonly known as:  PRAVACHOL  Take 1 tablet (20 mg total) by mouth 2 (two) times daily.     vitamin B-12 100 MCG tablet  Commonly known as:  CYANOCOBALAMIN  Take 100 mcg by mouth daily.           Follow-up Information   Follow up with  Sonda Primes, MD. Schedule an appointment as soon as possible for a visit in 2 weeks.   Specialty:  Internal Medicine   Contact information:   [address] [place] [address] [phone]        The results of significant diagnostics from this hospitalization (including imaging, microbiology, ancillary and laboratory) are listed below for reference.    Significant Diagnostic Studies: Dg Chest 2 View  02/26/2013   CLINICAL DATA:  Fall, altered mental status, history of CHF  EXAM: CHEST  2 VIEW  COMPARISON:  04/28/2012  FINDINGS: Stable cardiomegaly with mild vascular prominence centrally. No CHF or pneumonia. Negative for pleural effusion or pneumothorax. Trachea midline. Tortuous thoracic aorta contour. Degenerative changes of the spine. Stable exam.  IMPRESSION: Cardiomegaly without CHF or pneumonia.   Electronically Signed   By: Ruel Favors M.D.   On: 02/26/2013 20:59   Ct Head Wo Contrast  02/26/2013   CLINICAL DATA:  Fall.  Fatigued.  EXAM: CT HEAD WITHOUT CONTRAST  TECHNIQUE: Contiguous axial images were obtained from the base of the skull through the vertex without intravenous contrast.  COMPARISON:  11/26/2012  FINDINGS: Ventricles are normal in configuration. There is ventricular and sulcal enlargement reflecting moderate atrophy. There multiple white matter lacunar infarcts, old as well as basal gangliar/thymic lacunar infarcts. There is an old left cerebellar infarct. These are stable. Extensive white matter hypoattenuation is also noted consistent with chronic microvascular ischemic  change. There is no evidence of a cortical infarct. There are no parenchymal masses or mass effect.  There are no extra-axial masses or abnormal fluid collections.  There is no intracranial hemorrhage.  The visualized sinuses and mastoid air cells are clear.  IMPRESSION: No acute intracranial abnormalities. Stable appearance from the prior head CT.  Atrophy, advanced chronic microvascular ischemic change and multiple old infarcts.   Electronically Signed   By: Amie Portland M.D.   On: 02/26/2013 21:12    Microbiology: Recent Results (from the past 240 hour(s))  URINE CULTURE     Status: None   Collection Time    02/26/13  6:56 PM      Result Value Range Status   Specimen Description URINE, CATHETERIZED   Final   Special Requests NONE   Final   Culture  Setup Time     Final   Value: 02/27/2013 03:31     Performed at [facility] Count     Final   Value: >=100,000 COLONIES/ML     Performed at [facility]   Culture     Final   Value: Multiple bacterial morphotypes present, none predominant. Suggest appropriate recollection if clinically indicated.     Performed at [facility]   Report Status 03/01/2013 FINAL   Final     Labs: Basic Metabolic Panel:  Recent Labs Lab 02/26/13 1920 02/27/13 0445 03/01/13 0419 03/02/13 0907  NA 137 135 138 134*  K 4.1 3.7 3.6* 3.5*  CL 102 101 103 100  CO2 23 23 23 21   GLUCOSE 125* 103* 108* 115*  BUN 21 19 14 9   CREATININE 1.54* 1.52* 1.31* 1.16*  CALCIUM 9.9 9.2 8.6 8.6   Liver Function Tests:  Recent Labs Lab 02/26/13 1920  AST 22  ALT 10  ALKPHOS 85  BILITOT 0.3  PROT 7.8  ALBUMIN 3.6    Recent Labs Lab 02/26/13 1920  LIPASE 50   No results found for this basename:  AMMONIA,  in the last 168 hours CBC:  Recent Labs Lab 02/26/13 1920 02/27/13 0445  WBC 10.3 6.6  NEUTROABS 8.3*  --   HGB 12.5 11.4*  HCT 36.9 34.4*  MCV 86.6 87.1  PLT 224 203   Cardiac Enzymes: No results found for  this basename: CKTOTAL, CKMB, CKMBINDEX, TROPONINI,  in the last 168 hours BNP: BNP (last 3 results) No results found for this basename: PROBNP,  in the last 8760 hours CBG: No results found for this basename: GLUCAP,  in the last 168 hours  Time coordinating discharge: Over 30 minutes

## 2013-03-02 NOTE — Progress Notes (Signed)
Physical Therapy Treatment Patient Details Name: Jean [name] MRN: [id number] DOB: 1928/10/12 Today's Date: 03/02/2013 Time: 1478-2956 PT Time Calculation (min): 29 min  PT Assessment / Plan / Recommendation  History of Present Illness Patient is an 79 yo female admitted with frequent falls (bruising to rt hip and posterior head), and recent UTI.  recent DC from snf.   PT Comments   Pt requires extensive assistance for bed mobility and transfers. Pt incontinent of urine  Frequently. Plans for SNF rehab.  Follow Up Recommendations  SNF     Does the patient have the potential to tolerate intense rehabilitation     Barriers to Discharge        Equipment Recommendations  None recommended by PT    Recommendations for Other Services    Frequency Min 3X/week   Progress towards PT Goals Progress towards PT goals: Progressing toward goals  Plan Current plan remains appropriate    Precautions / Restrictions Precautions Precaution Comments: incontinence, will urinate when stands up.       Mobility  Bed Mobility Bed Mobility: Rolling Right;Right Sidelying to Sit Rolling Right: 3: Mod assist Right Sidelying to Sit: 2: Max assist Details for Bed Mobility Assistance: cues for supportting  Transfers Sit to Stand: 1: +1 Total assist;From bed;From chair/3-in-1 Sit to Stand: Patient Percentage: 30% Stand to Sit: To bed;To chair/3-in-1;1: +1 Total assist Stand to Sit: Patient Percentage: 30% Stand Pivot Transfers: 1: +1 Total assist Stand Pivot Transfers: Patient Percentage: 30% Details for Transfer Assistance: pt unable to stand erect , pt incontinenet of urine upon standing. required several attempts to get to partial stand to pivot to [facility] and same for back to the bed. .     Exercises     PT Diagnosis:    PT Problem List:   PT Treatment Interventions:     PT Goals (current goals can now be found in the care plan section)    Visit Information  Last PT Received On:  03/02/13 Assistance Needed: +2 History of Present Illness: Patient is an 79 yo female admitted with frequent falls (bruising to rt hip and posterior head), and recent UTI.  recent DC from snf.    Subjective Data      Cognition  Cognition Arousal/Alertness: Awake/alert Overall Cognitive Status: History of cognitive impairments - at baseline    Balance  Static Sitting Balance Static Sitting - Balance Support: Bilateral upper extremity supported;Feet supported Static Sitting - Level of Assistance: 4: Min assist  End of Session PT - End of Session Equipment Utilized During Treatment: Gait belt Activity Tolerance: Patient limited by fatigue Patient left: in bed;with call bell/phone within reach;with bed alarm set Nurse Communication: Mobility status   GP     Rada Hay 03/02/2013, 1:07 PM Blanchard Kelch PT [phone]

## 2013-03-02 NOTE — Progress Notes (Addendum)
Pt for discharge to [facility].  CSW facilitated pt discharge needs including contacting facility, faxing pt discharge information via [facility], confirming facility received insurance authorization from [facility], discussing with pt daughter via telephone, providing RN phone number to call report, and arranging ambulance transport for pt to [facility]. (Service Request ID#: [id number]).  No further social work needs identified at this time.  CSW signing off.  Jacklynn Lewis, MSW, LCSW Clinical Social Work [phone]

## 2013-03-02 NOTE — Progress Notes (Signed)
Report called to nurse at [facility] facility. Patient stable from AM assessment. Transportation has been called for patient to be transferred to facility.

## 2013-03-02 NOTE — Progress Notes (Signed)
Pt d/c'd via [facility] to [facility]. Pt's daughter called and notified that pt is on the way to [facility]. Eugene Garnet RN

## 2013-03-13 IMAGING — CR DG CHEST 1V PORT
1 series · 1 of 1 positions shown · non-contrast
Comparison: Chest radiograph performed 06/28/2011

CLINICAL DATA: Shortness of breath; decreased O2 saturation.
Wheezing and congestion.

PORTABLE CHEST - 1 VIEW

[AP]
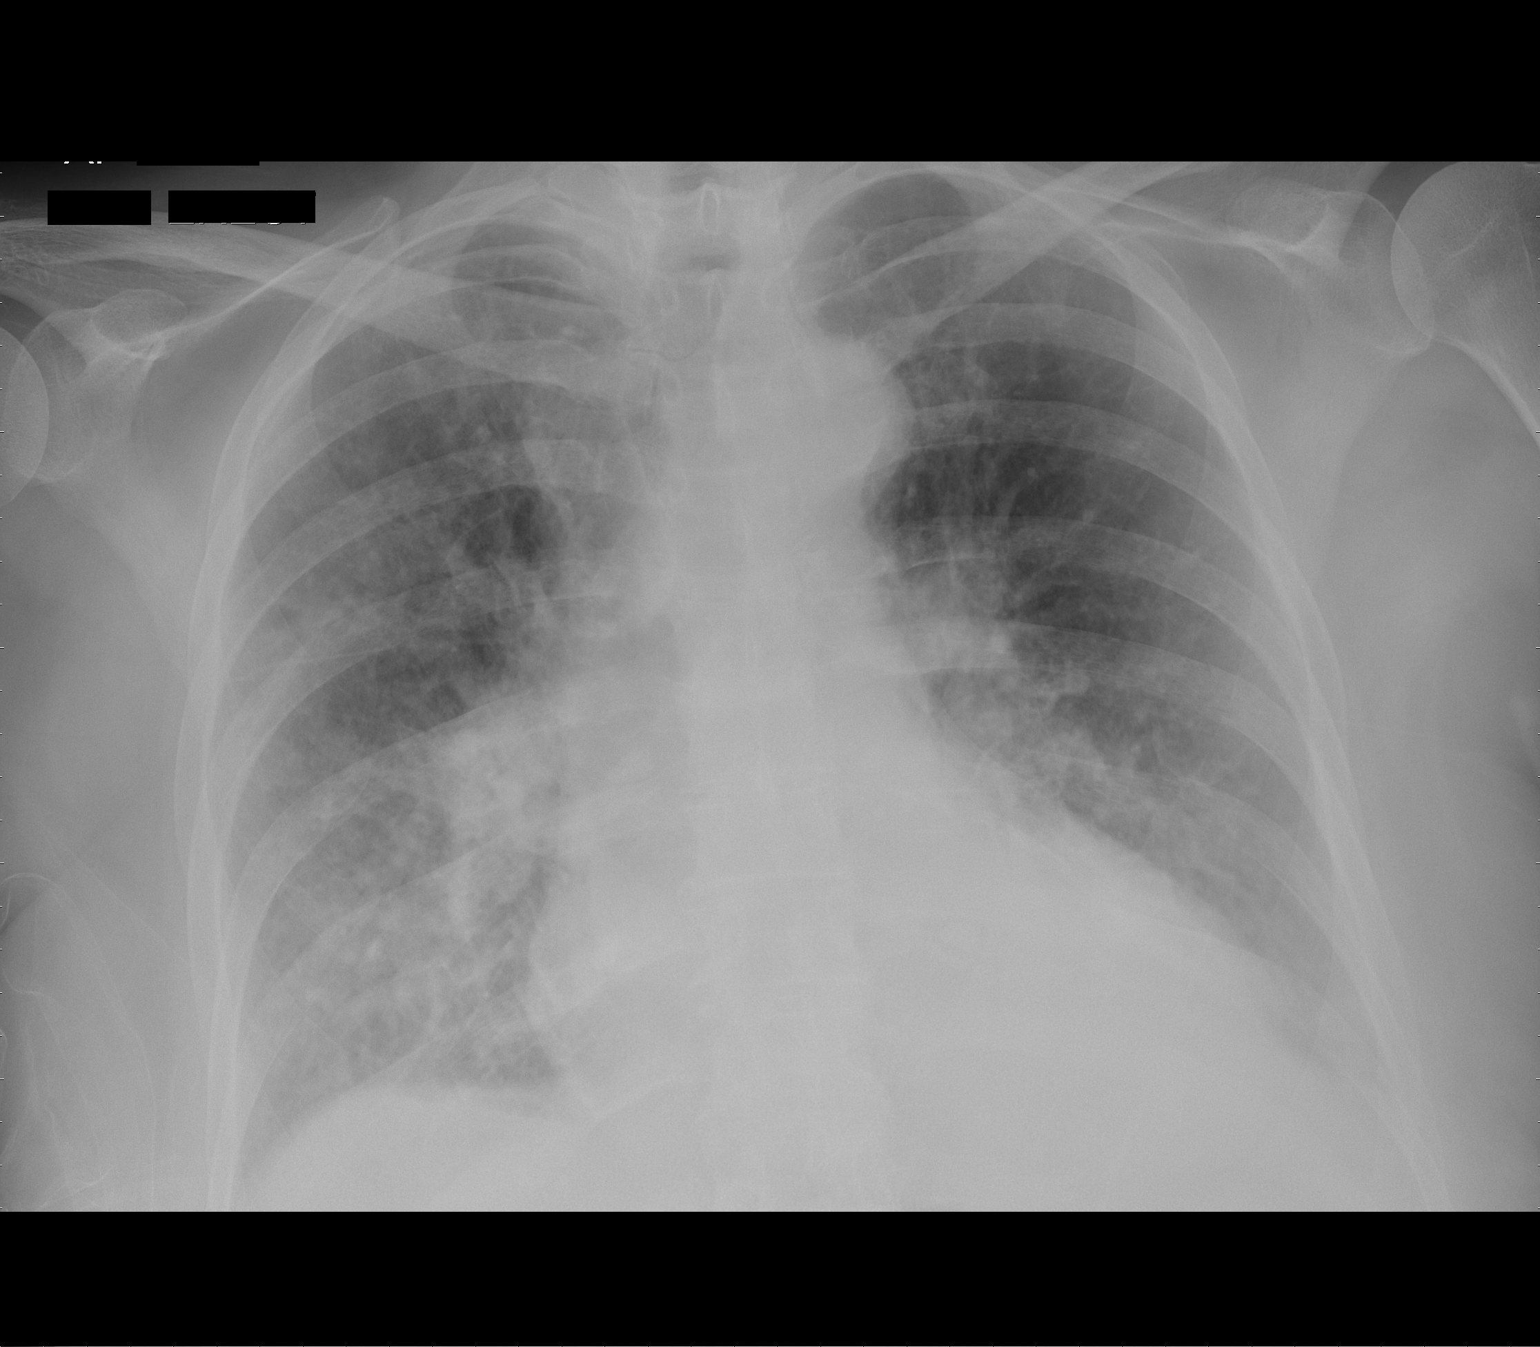

[1 of 1 positions shown; findings below may reference images not displayed]

FINDINGS: The lungs are well-aerated.  Vascular congestion is
noted, with increased interstitial markings, raising concern for
pulmonary edema.  Superimposed pneumonia cannot be excluded.  A
small left pleural effusion is likely present.  No pneumothorax is
seen.

The cardiomediastinal silhouette is borderline enlarged.  No acute
osseous abnormalities are seen.
IMPRESSION: Vascular congestion and borderline cardiomegaly, with increased
interstitial markings, raising concern for pulmonary edema;
superimposed pneumonia cannot be excluded.  Likely small left
pleural effusion.

## 2013-03-16 DIAGNOSIS — F3289 Other specified depressive episodes: Secondary | ICD-10-CM

## 2013-03-16 DIAGNOSIS — I129 Hypertensive chronic kidney disease with stage 1 through stage 4 chronic kidney disease, or unspecified chronic kidney disease: Secondary | ICD-10-CM

## 2013-03-16 DIAGNOSIS — I2510 Atherosclerotic heart disease of native coronary artery without angina pectoris: Secondary | ICD-10-CM

## 2013-03-16 DIAGNOSIS — I69959 Hemiplegia and hemiparesis following unspecified cerebrovascular disease affecting unspecified side: Secondary | ICD-10-CM

## 2013-03-16 DIAGNOSIS — N183 Chronic kidney disease, stage 3 unspecified: Secondary | ICD-10-CM

## 2013-03-16 DIAGNOSIS — N390 Urinary tract infection, site not specified: Secondary | ICD-10-CM

## 2013-03-16 DIAGNOSIS — F329 Major depressive disorder, single episode, unspecified: Secondary | ICD-10-CM

## 2013-04-27 ENCOUNTER — Telehealth: Payer: Self-pay | Admitting: *Deleted

## 2013-04-27 NOTE — Telephone Encounter (Signed)
Phoned and left verbal order on [name], [place] verbal order for OT

## 2013-04-27 NOTE — Telephone Encounter (Signed)
Ok Thx 

## 2013-04-27 NOTE — Telephone Encounter (Signed)
Minerva AreolaEric, PT phoned requesting VO for 3/week/3 weeks and 2 week/3 weeks.  CB# [phone]

## 2013-04-27 NOTE — Telephone Encounter (Signed)
OK. Thx

## 2013-04-27 NOTE — Telephone Encounter (Signed)
[name], OT with [facility], phoned requesting verbal orders for 2/week/4 weeks for ADL's & strengthening.  Please advise  CB# [phone]

## 2013-04-29 NOTE — Telephone Encounter (Signed)
Phoned and left voicemail message with verbal order for PT

## 2013-05-23 ENCOUNTER — Telehealth: Payer: Self-pay | Admitting: Internal Medicine

## 2013-05-23 NOTE — Telephone Encounter (Signed)
Jean [name], patient's Occupational Therapist is calling to request a verbal order for her to continue OT 2x a week for 4 weeks to continue working on ADL's and strengthening.  Please call Jean Sineancy back at [phone].

## 2013-05-24 NOTE — Telephone Encounter (Signed)
[name] notified/lmovm

## 2013-06-02 ENCOUNTER — Telehealth: Payer: Self-pay | Admitting: *Deleted

## 2013-06-02 MED ORDER — CIPROFLOXACIN HCL 500 MG PO TABS: 500.0000 mg | ORAL_TABLET | Freq: Every day | ORAL | Status: DC

## 2013-06-02 NOTE — Telephone Encounter (Signed)
Pt informed

## 2013-06-02 NOTE — Telephone Encounter (Signed)
OK Cipro 500 mg po qd x 10 d Thx

## 2013-06-02 NOTE — Telephone Encounter (Signed)
Pt's daughter states her therapist came out and thinks pt has UTI. The only way to check UA is to cath. They are requesting Abx. Please advise.

## 2013-06-03 ENCOUNTER — Other Ambulatory Visit (HOSPITAL_COMMUNITY): Payer: Self-pay | Admitting: Internal Medicine

## 2013-07-07 ENCOUNTER — Other Ambulatory Visit (HOSPITAL_COMMUNITY): Payer: Self-pay | Admitting: Internal Medicine

## 2013-07-18 ENCOUNTER — Other Ambulatory Visit: Payer: Self-pay | Admitting: *Deleted

## 2013-07-18 ENCOUNTER — Telehealth: Payer: Self-pay | Admitting: Internal Medicine

## 2013-07-18 MED ORDER — PRAVASTATIN SODIUM 20 MG PO TABS: 20.0000 mg | ORAL_TABLET | Freq: Two times a day (BID) | ORAL | Status: DC

## 2013-07-18 NOTE — Telephone Encounter (Signed)
[facility] is calling in regards to the pravastatin (PRAVACHOL) 20 MG rx that was sent over. Patient usually only take one a day, however, rx that was sent instructs patient to take two times daily.  They want to verify this new sig. Please advise.

## 2013-07-19 MED ORDER — PRAVASTATIN SODIUM 20 MG PO TABS: 20.0000 mg | ORAL_TABLET | Freq: Every day | ORAL | Status: AC

## 2013-07-19 NOTE — Telephone Encounter (Signed)
[facility]. New Rx sent.

## 2013-07-19 NOTE — Telephone Encounter (Signed)
One a day is correct Thx

## 2013-07-28 ENCOUNTER — Ambulatory Visit: Payer: Medicare PPO | Admitting: Internal Medicine

## 2013-07-29 ENCOUNTER — Ambulatory Visit (INDEPENDENT_AMBULATORY_CARE_PROVIDER_SITE_OTHER): Payer: Medicare PPO | Admitting: Internal Medicine

## 2013-07-29 ENCOUNTER — Encounter: Payer: Self-pay | Admitting: Internal Medicine

## 2013-07-29 ENCOUNTER — Ambulatory Visit (INDEPENDENT_AMBULATORY_CARE_PROVIDER_SITE_OTHER): Payer: Medicare PPO

## 2013-07-29 VITALS — BP 140/70 | HR 76 | Temp 97.8°F | Resp 16 | Wt 150.0 lb

## 2013-07-29 DIAGNOSIS — R296 Repeated falls: Secondary | ICD-10-CM

## 2013-07-29 DIAGNOSIS — I2510 Atherosclerotic heart disease of native coronary artery without angina pectoris: Secondary | ICD-10-CM

## 2013-07-29 DIAGNOSIS — E039 Hypothyroidism, unspecified: Secondary | ICD-10-CM

## 2013-07-29 DIAGNOSIS — Z9181 History of falling: Secondary | ICD-10-CM

## 2013-07-29 DIAGNOSIS — I10 Essential (primary) hypertension: Secondary | ICD-10-CM

## 2013-07-29 DIAGNOSIS — F329 Major depressive disorder, single episode, unspecified: Secondary | ICD-10-CM

## 2013-07-29 DIAGNOSIS — F32A Depression, unspecified: Secondary | ICD-10-CM

## 2013-07-29 DIAGNOSIS — F0280 Dementia in other diseases classified elsewhere without behavioral disturbance: Secondary | ICD-10-CM

## 2013-07-29 DIAGNOSIS — G309 Alzheimer's disease, unspecified: Secondary | ICD-10-CM

## 2013-07-29 DIAGNOSIS — F3289 Other specified depressive episodes: Secondary | ICD-10-CM

## 2013-07-29 LAB — CBC WITH DIFFERENTIAL/PLATELET
BASOS PCT: 0.4 % (ref 0.0–3.0)
Basophils Absolute: 0 10*3/uL (ref 0.0–0.1)
Eosinophils Absolute: 0.1 10*3/uL (ref 0.0–0.7)
Eosinophils Relative: 1.6 % (ref 0.0–5.0)
HEMATOCRIT: 40.1 % (ref 36.0–46.0)
HEMOGLOBIN: 13.4 g/dL (ref 12.0–15.0)
LYMPHS PCT: 22.7 % (ref 12.0–46.0)
Lymphs Abs: 1.8 10*3/uL (ref 0.7–4.0)
MCHC: 33.4 g/dL (ref 30.0–36.0)
MCV: 84.9 fl (ref 78.0–100.0)
Monocytes Absolute: 0.8 10*3/uL (ref 0.1–1.0)
Monocytes Relative: 9.7 % (ref 3.0–12.0)
NEUTROS ABS: 5.1 10*3/uL (ref 1.4–7.7)
Neutrophils Relative %: 65.6 % (ref 43.0–77.0)
Platelets: 203 10*3/uL (ref 150.0–400.0)
RBC: 4.72 Mil/uL (ref 3.87–5.11)
RDW: 13.6 % (ref 11.5–15.5)
WBC: 7.8 10*3/uL (ref 4.0–10.5)

## 2013-07-29 LAB — BASIC METABOLIC PANEL
BUN: 26 mg/dL — ABNORMAL HIGH (ref 6–23)
CO2: 28 meq/L (ref 19–32)
Calcium: 9.6 mg/dL (ref 8.4–10.5)
Chloride: 105 mEq/L (ref 96–112)
Creatinine, Ser: 1.6 mg/dL — ABNORMAL HIGH (ref 0.4–1.2)
GFR: 33.66 mL/min — ABNORMAL LOW (ref >60.00)
Glucose, Bld: 91 mg/dL (ref 70–99)
Potassium: 4.3 mEq/L (ref 3.5–5.1)
SODIUM: 139 meq/L (ref 135–145)

## 2013-07-29 LAB — TSH: TSH: 0.29 u[IU]/mL — ABNORMAL LOW (ref 0.35–4.50)

## 2013-07-29 MED ORDER — FLUOXETINE HCL 10 MG PO TABS: 10.0000 mg | ORAL_TABLET | Freq: Every day | ORAL | Status: AC

## 2013-07-29 MED ORDER — METOPROLOL TARTRATE 100 MG PO TABS: 150.0000 mg | ORAL_TABLET | Freq: Two times a day (BID) | ORAL | Status: DC

## 2013-07-29 MED ORDER — POLYETHYLENE GLYCOL 3350 17 GM/SCOOP PO POWD: 17.0000 g | Freq: Every day | ORAL | Status: DC | PRN

## 2013-07-29 MED ORDER — CLOPIDOGREL BISULFATE 75 MG PO TABS: 75.0000 mg | ORAL_TABLET | Freq: Every day | ORAL | Status: AC

## 2013-07-29 MED ORDER — MECLIZINE HCL 25 MG PO TABS: 25.0000 mg | ORAL_TABLET | Freq: Three times a day (TID) | ORAL | Status: DC | PRN

## 2013-07-29 NOTE — Assessment & Plan Note (Signed)
Continue with current prescription therapy as reflected on the Med list.  

## 2013-07-29 NOTE — Assessment & Plan Note (Signed)
Walker at home Had PT w/[facility]

## 2013-07-29 NOTE — Progress Notes (Signed)
Subjective:      Back Pain This is a chronic problem. The problem has been gradually improving since onset. The pain is at a severity of 4/10. The pain is moderate. Associated symptoms include weakness. Pertinent negatives include no pelvic pain or weight loss. She has tried nothing for the symptoms. The treatment provided mild relief.   F/u on LBP - s/p surgery; CVA complicated her recovery... C/o falling - tripping over things, no LOC... She is finished w/PT at home. Lack of drive. Not getting around much...using a walker and w/c Constipated...  The patient presents for a follow-up of  chronic hypertension, chronic dyslipidemia, CAD controlled with medicines  C/o memory issues - worse. Confused. She ran out of the [employer]  Wt Readings from Last 3 Encounters:  07/29/13 150 lb (68.04 kg)  02/26/13 148 lb 9.4 oz (67.4 kg)  11/28/12 157 lb 10.1 oz (71.5 kg)   BP Readings from Last 3 Encounters:  07/29/13 140/70  03/02/13 115/95  11/30/12 144/59       Review of Systems  Constitutional: Positive for fatigue. Negative for chills, weight loss, activity change, appetite change and unexpected weight change.  HENT: Negative for congestion, mouth sores and sinus pressure.   Eyes: Negative for visual disturbance.  Respiratory: Negative for cough and chest tightness.   Genitourinary: Negative for frequency, difficulty urinating, vaginal pain and pelvic pain.  Musculoskeletal: Positive for back pain and gait problem (foot bain is better).  Skin: Negative for pallor and rash.  Neurological: Positive for weakness. Negative for dizziness and tremors.  Psychiatric/Behavioral: Positive for confusion and decreased concentration. Negative for suicidal ideas, sleep disturbance and self-injury.       Objective:   Physical Exam  Constitutional: She appears well-developed and well-nourished. No distress.  w/c  HENT:  Head: Normocephalic.  Right Ear: External ear normal.  Left Ear:  External ear normal.  Nose: Nose normal.  Mouth/Throat: Oropharynx is clear and moist. No oropharyngeal exudate.  Mouth is moist  Eyes: Conjunctivae are normal. Pupils are equal, round, and reactive to light. Right eye exhibits no discharge. Left eye exhibits no discharge.  Neck: Normal range of motion. Neck supple. No JVD present. No tracheal deviation present. No thyromegaly present.  Cardiovascular: Normal rate, regular rhythm and normal heart sounds.   Pulmonary/Chest: No stridor. No respiratory distress. She has no wheezes.  Abdominal: Soft. Bowel sounds are normal. She exhibits no distension and no mass. There is no tenderness. There is no rebound and no guarding.  Musculoskeletal: She exhibits edema (1+ B) and tenderness (R ankle is tender w/ROM a little).  Lymphadenopathy:    She has no cervical adenopathy.  Neurological: She displays normal reflexes. A cranial nerve deficit is present. She exhibits normal muscle tone. Coordination abnormal.   Unable to get up off/out the w/chair No pron drift B No focal sx's Alert, cooperative  Skin: Skin is warm and dry. No rash noted. No erythema.  Psychiatric: She has a normal mood and affect. Her behavior is normal.  No tremor In a w/c  Lab Results  Component Value Date   WBC 6.6 02/27/2013   HGB 11.4* 02/27/2013   HCT 34.4* 02/27/2013   PLT 203 02/27/2013   GLUCOSE 115* 03/02/2013   CHOL 203* 05/17/2012   TRIG 226.0* 05/17/2012   HDL 43.40 05/17/2012   LDLDIRECT 114.7 05/17/2012   LDLCALC 93 12/08/2011   ALT 10 02/26/2013   AST 22 02/26/2013   NA 134* 03/02/2013   K 3.5*  03/02/2013   CL 100 03/02/2013   CREATININE 1.16* 03/02/2013   BUN 9 03/02/2013   CO2 21 03/02/2013   TSH 2.41 08/12/2012   INR 1.29 06/23/2011   HGBA1C 5.7* 07/01/2011   MICROALBUR 1.3 08/17/2006       Assessment & Plan:

## 2013-07-29 NOTE — Patient Instructions (Signed)
Wt Readings from Last 3 Encounters:  07/29/13 150 lb (68.04 kg)  02/26/13 148 lb 9.4 oz (67.4 kg)  11/28/12 157 lb 10.1 oz (71.5 kg)

## 2013-07-29 NOTE — Progress Notes (Signed)
Pre visit review using our clinic review tool, if applicable. No additional management support is needed unless otherwise documented below in the visit note. 

## 2013-07-29 NOTE — Assessment & Plan Note (Signed)
See Rx 

## 2013-08-03 ENCOUNTER — Encounter: Payer: Self-pay | Admitting: *Deleted

## 2013-08-03 ENCOUNTER — Other Ambulatory Visit: Payer: Self-pay | Admitting: Internal Medicine

## 2013-08-03 LAB — URINALYSIS, ROUTINE W REFLEX MICROSCOPIC
Bilirubin Urine: NEGATIVE
KETONES UR: NEGATIVE
Nitrite: POSITIVE — AB
Specific Gravity, Urine: 1.025 (ref 1.000–1.030)
TOTAL PROTEIN, URINE-UPE24: 100 — AB
Urine Glucose: NEGATIVE
Urobilinogen, UA: 0.2 (ref 0.0–1.0)
pH: 6 (ref 5.0–8.0)

## 2013-08-03 MED ORDER — CIPROFLOXACIN HCL 250 MG PO TABS
250.0000 mg | ORAL_TABLET | Freq: Two times a day (BID) | ORAL | Status: DC
Start: 2013-08-03 — End: 2013-11-23

## 2013-08-15 ENCOUNTER — Other Ambulatory Visit: Payer: Self-pay | Admitting: *Deleted

## 2013-08-15 MED ORDER — METOPROLOL TARTRATE 100 MG PO TABS: 150.0000 mg | ORAL_TABLET | Freq: Two times a day (BID) | ORAL | Status: AC

## 2013-09-13 ENCOUNTER — Telehealth: Payer: Self-pay | Admitting: Internal Medicine

## 2013-09-13 MED ORDER — LEVOTHYROXINE SODIUM 75 MCG PO TABS: 75.0000 ug | ORAL_TABLET | Freq: Every day | ORAL | Status: AC

## 2013-09-13 NOTE — Telephone Encounter (Signed)
[facility]ills to [facility] on pt levothyroxine...Raechel Chute/lmb

## 2013-09-13 NOTE — Telephone Encounter (Signed)
[facility] is calling to request a refill on the patient's levothyroxine (SYNTHROID, LEVOTHROID) 75 MCG medication. She says the patient just showed up at the pharmacy asking for it. Please advise.

## 2013-11-10 ENCOUNTER — Other Ambulatory Visit: Payer: Self-pay | Admitting: *Deleted

## 2013-11-10 MED ORDER — OMEPRAZOLE 20 MG PO CPDR: 20.0000 mg | DELAYED_RELEASE_CAPSULE | Freq: Every day | ORAL | Status: AC

## 2013-11-23 ENCOUNTER — Emergency Department (HOSPITAL_COMMUNITY)
Admission: EM | Admit: 2013-11-23 | Discharge: 2013-11-23 | Disposition: A | Discharge status: Home / Self Care | Payer: Medicare PPO | Attending: Emergency Medicine | Admitting: Emergency Medicine

## 2013-11-23 ENCOUNTER — Emergency Department (HOSPITAL_COMMUNITY): Payer: Medicare PPO

## 2013-11-23 ENCOUNTER — Encounter (HOSPITAL_COMMUNITY): Payer: Self-pay | Admitting: Emergency Medicine

## 2013-11-23 DIAGNOSIS — Z7982 Long term (current) use of aspirin: Secondary | ICD-10-CM | POA: Insufficient documentation

## 2013-11-23 DIAGNOSIS — M1990 Unspecified osteoarthritis, unspecified site: Secondary | ICD-10-CM | POA: Diagnosis not present

## 2013-11-23 DIAGNOSIS — I129 Hypertensive chronic kidney disease with stage 1 through stage 4 chronic kidney disease, or unspecified chronic kidney disease: Secondary | ICD-10-CM | POA: Insufficient documentation

## 2013-11-23 DIAGNOSIS — I252 Old myocardial infarction: Secondary | ICD-10-CM | POA: Diagnosis not present

## 2013-11-23 DIAGNOSIS — R5383 Other fatigue: Secondary | ICD-10-CM | POA: Diagnosis present

## 2013-11-23 DIAGNOSIS — D649 Anemia, unspecified: Secondary | ICD-10-CM | POA: Diagnosis not present

## 2013-11-23 DIAGNOSIS — N183 Chronic kidney disease, stage 3 unspecified: Secondary | ICD-10-CM | POA: Insufficient documentation

## 2013-11-23 DIAGNOSIS — IMO0002 Reserved for concepts with insufficient information to code with codable children: Secondary | ICD-10-CM | POA: Diagnosis not present

## 2013-11-23 DIAGNOSIS — I509 Heart failure, unspecified: Secondary | ICD-10-CM | POA: Insufficient documentation

## 2013-11-23 DIAGNOSIS — E039 Hypothyroidism, unspecified: Secondary | ICD-10-CM | POA: Insufficient documentation

## 2013-11-23 DIAGNOSIS — E559 Vitamin D deficiency, unspecified: Secondary | ICD-10-CM | POA: Diagnosis not present

## 2013-11-23 DIAGNOSIS — N390 Urinary tract infection, site not specified: Secondary | ICD-10-CM | POA: Insufficient documentation

## 2013-11-23 DIAGNOSIS — E785 Hyperlipidemia, unspecified: Secondary | ICD-10-CM | POA: Insufficient documentation

## 2013-11-23 DIAGNOSIS — F0390 Unspecified dementia without behavioral disturbance: Secondary | ICD-10-CM | POA: Insufficient documentation

## 2013-11-23 DIAGNOSIS — I2510 Atherosclerotic heart disease of native coronary artery without angina pectoris: Secondary | ICD-10-CM | POA: Insufficient documentation

## 2013-11-23 DIAGNOSIS — Z9861 Coronary angioplasty status: Secondary | ICD-10-CM | POA: Diagnosis not present

## 2013-11-23 DIAGNOSIS — Z792 Long term (current) use of antibiotics: Secondary | ICD-10-CM | POA: Insufficient documentation

## 2013-11-23 DIAGNOSIS — Z7902 Long term (current) use of antithrombotics/antiplatelets: Secondary | ICD-10-CM | POA: Diagnosis not present

## 2013-11-23 DIAGNOSIS — Z79899 Other long term (current) drug therapy: Secondary | ICD-10-CM | POA: Diagnosis not present

## 2013-11-23 DIAGNOSIS — R5381 Other malaise: Secondary | ICD-10-CM | POA: Insufficient documentation

## 2013-11-23 DIAGNOSIS — Z880 Allergy status to penicillin: Secondary | ICD-10-CM | POA: Insufficient documentation

## 2013-11-23 DIAGNOSIS — Z8673 Personal history of transient ischemic attack (TIA), and cerebral infarction without residual deficits: Secondary | ICD-10-CM | POA: Diagnosis not present

## 2013-11-23 LAB — URINALYSIS, ROUTINE W REFLEX MICROSCOPIC
BILIRUBIN URINE: NEGATIVE
Glucose, UA: NEGATIVE mg/dL
Ketones, ur: NEGATIVE mg/dL
Nitrite: POSITIVE — AB
PH: 7.5 (ref 5.0–8.0)
Protein, ur: 100 mg/dL — AB
Specific Gravity, Urine: 1.012 (ref 1.005–1.030)
Urobilinogen, UA: 0.2 mg/dL (ref 0.0–1.0)

## 2013-11-23 LAB — URINE MICROSCOPIC-ADD ON

## 2013-11-23 LAB — BASIC METABOLIC PANEL
ANION GAP: 14 (ref 5–15)
BUN: 23 mg/dL (ref 6–23)
CHLORIDE: 101 meq/L (ref 96–112)
CO2: 23 meq/L (ref 19–32)
CREATININE: 1.36 mg/dL — AB (ref 0.50–1.10)
Calcium: 9.6 mg/dL (ref 8.4–10.5)
GFR calc Af Amer: 40 mL/min — ABNORMAL LOW (ref >90)
GFR calc non Af Amer: 35 mL/min — ABNORMAL LOW (ref >90)
Glucose, Bld: 95 mg/dL (ref 70–99)
POTASSIUM: 4.6 meq/L (ref 3.7–5.3)
Sodium: 138 mEq/L (ref 137–147)

## 2013-11-23 LAB — CBC WITH DIFFERENTIAL/PLATELET
BASOS PCT: 0 % (ref 0–1)
Basophils Absolute: 0 10*3/uL (ref 0.0–0.1)
Eosinophils Absolute: 0.2 10*3/uL (ref 0.0–0.7)
Eosinophils Relative: 2 % (ref 0–5)
HEMATOCRIT: 37.4 % (ref 36.0–46.0)
Hemoglobin: 12.5 g/dL (ref 12.0–15.0)
LYMPHS PCT: 22 % (ref 12–46)
Lymphs Abs: 2 10*3/uL (ref 0.7–4.0)
MCH: 29.2 pg (ref 26.0–34.0)
MCHC: 33.4 g/dL (ref 30.0–36.0)
MCV: 87.4 fL (ref 78.0–100.0)
MONOS PCT: 9 % (ref 3–12)
Monocytes Absolute: 0.8 10*3/uL (ref 0.1–1.0)
NEUTROS ABS: 6 10*3/uL (ref 1.7–7.7)
NEUTROS PCT: 67 % (ref 43–77)
Platelets: 320 10*3/uL (ref 150–400)
RBC: 4.28 MIL/uL (ref 3.87–5.11)
RDW: 12.8 % (ref 11.5–15.5)
WBC: 9 10*3/uL (ref 4.0–10.5)

## 2013-11-23 MED ORDER — CEFTRIAXONE SODIUM 1 G IJ SOLR
1.0000 g | INTRAMUSCULAR | Status: DC
  Administered 2013-11-23: 1 g via INTRAMUSCULAR
  Filled 2013-11-23: qty 10

## 2013-11-23 MED ORDER — CEPHALEXIN 500 MG PO CAPS: 500.0000 mg | ORAL_CAPSULE | Freq: Four times a day (QID) | ORAL | Status: DC

## 2013-11-23 MED ORDER — LIDOCAINE HCL 1 % IJ SOLN
INTRAMUSCULAR | Status: AC
  Administered 2013-11-23: 16:00:00
  Filled 2013-11-23: qty 20

## 2013-11-23 NOTE — ED Provider Notes (Signed)
CSN: [id number]     Arrival date & time 11/23/13  1255 History   First MD Initiated Contact with Patient 11/23/13 1301     Chief Complaint  Patient presents with  . Weakness     (Consider location/radiation/quality/duration/timing/severity/associated sxs/prior Treatment) HPI Comments: Patient here with complaint of worsening left-sided weakness. She has history of dementia and cannot give any history. This information is per the EMS report. The left leg weakness has been present for about a week. Mental status from baseline. No further history obtainable.  Patient is a 80 y.o. female presenting with weakness. The history is provided by the EMS personnel. The history is limited by the condition of the patient.  Weakness    Past Medical History  Diagnosis Date  . Hyperlipidemia   . Hypothyroid     hypo  . CHF (congestive heart failure)   . Mitral regurgitation   . Tricuspid regurgitation   . Anemia   . Arthritis     Osteoarthtistis  . Elevated glucose   . CVA (cerebral infarction)   . Vitamin D deficiency   . Myocardial infarction   . Chronic kidney failure     stage 3  . CAD (coronary artery disease)     sees Dr. Ricka Burdock last 03/2011  . Hypertension     Dr. Eden Emms cardiologist, Dr. Cheron Schaumann primary doctor   Past Surgical History  Procedure Laterality Date  . Abdominal hysterectomy    . Back surgery    . Coronary stent placement    . Laparotomy  06/23/2011    Procedure: EXPLORATORY LAPAROTOMY;  Surgeon: Barnett Abu, MD;  Location: [facility];  Service: Neurosurgery;  Laterality: Bilateral;  . Esophagogastroduodenoscopy N/A 04/28/2012    Procedure: ESOPHAGOGASTRODUODENOSCOPY (EGD);  Surgeon: Hart Carwin, MD;  Location: [facility];  Service: Endoscopy;  Laterality: N/A;   Family History  Problem Relation Age of Onset  . Aneurysm Mother   . Heart disease Mother   . Heart attack Father   . Hypertension Father   . Anesthesia problems Neg Hx   . Colon cancer  Neg Hx   . Esophageal cancer Neg Hx    History  Substance Use Topics  . Smoking status: Never Smoker   . Smokeless tobacco: Never Used  . Alcohol Use: No   OB History   Grav Para Term Preterm Abortions TAB SAB Ect Mult Living                 Review of Systems  Unable to perform ROS Neurological: Positive for weakness.      Allergies  Ace inhibitors; Aricept; Ezetimibe-simvastatin; Methyldopa; Rosuvastatin; and Penicillins  Home Medications   Prior to Admission medications   Medication Sig Start Date End Date Taking? Authorizing Provider  amLODipine (NORVASC) 5 MG tablet Take 1 tablet (5 mg total) by mouth daily. 03/02/13   Alison Murray, MD  aspirin EC 81 MG EC tablet Take 1 tablet (81 mg total) by mouth daily. 03/02/13   Alison Murray, MD  cholecalciferol (VITAMIN D) 1000 UNITS tablet Take 1,000 Units by mouth daily.    Aleksei Plotnikov V, MD  ciprofloxacin (CIPRO) 250 MG tablet Take 1 tablet (250 mg total) by mouth 2 (two) times daily. 08/03/13   Aleksei Plotnikov V, MD  clopidogrel (PLAVIX) 75 MG tablet Take 1 tablet (75 mg total) by mouth daily. 07/29/13   Aleksei Plotnikov V, MD  ergocalciferol (VITAMIN D2) 50000 UNITS capsule Take 1 capsule (50,000 Units total) by mouth  once a week. 04/28/12   Hart Carwin, MD  feeding supplement, ENSURE COMPLETE, (ENSURE COMPLETE) LIQD Take 237 mLs by mouth daily. 03/02/13   Alison Murray, MD  FLUoxetine (PROZAC) 10 MG tablet Take 1 tablet (10 mg total) by mouth daily. 07/29/13   Aleksei Plotnikov V, MD  levothyroxine (SYNTHROID, LEVOTHROID) 75 MCG tablet Take 1 tablet (75 mcg total) by mouth daily. 09/13/13   Aleksei Plotnikov V, MD  meclizine (ANTIVERT) 25 MG tablet Take 1 tablet (25 mg total) by mouth 3 (three) times daily as needed for dizziness or nausea. 07/29/13   Aleksei Plotnikov V, MD  metoprolol (LOPRESSOR) 100 MG tablet Take 1.5 tablets (150 mg total) by mouth 2 (two) times daily. 08/15/13   Aleksei Plotnikov V, MD  nitroGLYCERIN  (NITROSTAT) 0.4 MG SL tablet Place 0.4 mg under the tongue every 5 (five) minutes as needed. For chest pain    Historical Provider, MD  omeprazole (PRILOSEC) 20 MG capsule Take 1 capsule (20 mg total) by mouth daily. 11/10/13   Aleksei Plotnikov V, MD  polyethylene glycol powder (GLYCOLAX/MIRALAX) powder Take 17 g by mouth daily as needed for moderate constipation. 07/29/13   Aleksei Plotnikov V, MD  pravastatin (PRAVACHOL) 20 MG tablet Take 1 tablet (20 mg total) by mouth daily. 07/19/13   Aleksei Plotnikov V, MD  vitamin B-12 (CYANOCOBALAMIN) 100 MCG tablet Take 100 mcg by mouth daily.    Historical Provider, MD   BP 152/54  Pulse 58  Temp(Src) 97.8 F (36.6 C) (Oral)  Resp 18  SpO2 96% Physical Exam  Nursing note and vitals reviewed. Constitutional: She appears well-developed and well-nourished.  Non-toxic appearance. No distress.  HENT:  Head: Normocephalic and atraumatic.  Eyes: Conjunctivae, EOM and lids are normal. Pupils are equal, round, and reactive to light.  Neck: Normal range of motion. Neck supple. No tracheal deviation present. No mass present.  Cardiovascular: Normal rate, regular rhythm and normal heart sounds.  Exam reveals no gallop.   No murmur heard. Pulmonary/Chest: Effort normal and breath sounds normal. No stridor. No respiratory distress. She has no decreased breath sounds. She has no wheezes. She has no rhonchi. She has no rales.  Abdominal: Soft. Normal appearance and bowel sounds are normal. She exhibits no distension. There is no tenderness. There is no rebound and no CVA tenderness.  Musculoskeletal: Normal range of motion. She exhibits no edema and no tenderness.  Neurological: She is alert. She displays no atrophy. No cranial nerve deficit or sensory deficit. She exhibits normal muscle tone. GCS eye subscore is 4. GCS verbal subscore is 4. GCS motor subscore is 6.  Skin: Skin is warm and dry. No abrasion and no rash noted.  Psychiatric: She has a normal mood and  affect. Her speech is normal and behavior is normal.    ED Course  Procedures (including critical care time) Labs Review Labs Reviewed  URINE CULTURE  CBC WITH DIFFERENTIAL  BASIC METABOLIC PANEL  URINALYSIS, ROUTINE W REFLEX MICROSCOPIC    Imaging Review No results found.   EKG Interpretation   Date/Time:  Wednesday November 23 2013 13:09:41 EDT Ventricular Rate:  64 PR Interval:  234 QRS Duration: 87 QT Interval:  588 QTC Calculation: 607 R Axis:   -45 Text Interpretation:  Sinus or ectopic atrial rhythm Atrial premature  complexes Prolonged PR interval Left anterior fascicular block Low  voltage, precordial leads LVH by voltage Anterior Q waves, possibly due to  LVH Prolonged QT interval No significant change since  last tracing  Confirmed by Freida Busman  MD, Diontre Harps ([id number]) on 11/23/2013 1:28:55 PM      MDM   Final diagnoses:  None    Patient to be treated for UTI    Toy Baker, MD 11/23/13 1520

## 2013-11-23 NOTE — Discharge Instructions (Signed)

## 2013-11-23 NOTE — ED Notes (Signed)
Per EMS, pt family states the pt has had left leg weakness for the past week. Pt has hx of alzheimer's, stroke. Family states pt is at baseline mental status. Pt alert and oriented to self only.

## 2013-11-23 NOTE — ED Notes (Signed)
Bed: WA02 Expected date:  Expected time:  Means of arrival:  Comments: EMS 

## 2013-11-25 LAB — URINE CULTURE

## 2013-11-27 ENCOUNTER — Telehealth (HOSPITAL_BASED_OUTPATIENT_CLINIC_OR_DEPARTMENT_OTHER): Payer: Self-pay

## 2013-11-27 NOTE — Telephone Encounter (Signed)
Post ED Visit - Positive Culture Follow-up  Culture report reviewed by antimicrobial stewardship pharmacist:  Wes Dulaney, Pharm.D., BCPS  Celedonio Miyamoto, Pharm.D., BCPS  Georgina Pillion, Pharm.D., BCPS  [place], [place].D., BCPS, AAHIVP  Estella Husk, Pharm.D., BCPS, AAHIVP  Carly Sabat, Pharm.D.  Enzo Bi, [address].D.  Positive Urine culture, >/= 100,000 colonies -> E Coli Treated with Cephalexin, organism sensitive to the same and no further patient follow-up is required at this time.  Arvid Right 11/27/2013, 1:22 AM

## 2013-12-05 ENCOUNTER — Ambulatory Visit: Payer: Medicare PPO | Admitting: Internal Medicine

## 2013-12-06 ENCOUNTER — Ambulatory Visit (INDEPENDENT_AMBULATORY_CARE_PROVIDER_SITE_OTHER): Payer: Medicare PPO | Admitting: Internal Medicine

## 2013-12-06 VITALS — BP 142/80 | HR 77 | Temp 98.2°F | Resp 12

## 2013-12-06 DIAGNOSIS — F0280 Dementia in other diseases classified elsewhere without behavioral disturbance: Secondary | ICD-10-CM

## 2013-12-06 DIAGNOSIS — N183 Chronic kidney disease, stage 3 unspecified: Secondary | ICD-10-CM

## 2013-12-06 DIAGNOSIS — Z23 Encounter for immunization: Secondary | ICD-10-CM

## 2013-12-06 DIAGNOSIS — I639 Cerebral infarction, unspecified: Secondary | ICD-10-CM | POA: Insufficient documentation

## 2013-12-06 DIAGNOSIS — E039 Hypothyroidism, unspecified: Secondary | ICD-10-CM

## 2013-12-06 DIAGNOSIS — G219 Secondary parkinsonism, unspecified: Secondary | ICD-10-CM | POA: Insufficient documentation

## 2013-12-06 DIAGNOSIS — N390 Urinary tract infection, site not specified: Secondary | ICD-10-CM

## 2013-12-06 DIAGNOSIS — I10 Essential (primary) hypertension: Secondary | ICD-10-CM

## 2013-12-06 DIAGNOSIS — G309 Alzheimer's disease, unspecified: Secondary | ICD-10-CM

## 2013-12-06 DIAGNOSIS — R296 Repeated falls: Secondary | ICD-10-CM

## 2013-12-06 MED ORDER — CEFUROXIME AXETIL 250 MG PO TABS: 250.0000 mg | ORAL_TABLET | Freq: Two times a day (BID) | ORAL | Status: AC

## 2013-12-06 NOTE — Assessment & Plan Note (Signed)
BP Readings from Last 3 Encounters:  12/06/13 142/80  11/23/13 152/54  07/29/13 140/70

## 2013-12-06 NOTE — Progress Notes (Signed)
Subjective:    C/o not being able to walk or stand, worse x 3 weeks (?small CVA); fell last night. Pt has been eating and drinking very little. She has been very quiet, disoriented. C/o recent UTI w/E coli - 9.23.15 C/o not responding to current Rx Pt needs assistance of 2-3 people to get up  HPI F/u on LBP - s/p surgery; CVA complicated her recovery... C/o falling - tripping over things, no LOC... She is finished w/PT at home. Lack of drive. Constipated...  The patient presents for a follow-up of  chronic hypertension, chronic dyslipidemia, CAD controlled with medicines  .   Wt Readings from Last 3 Encounters:  07/29/13 150 lb (68.04 kg)  02/26/13 148 lb 9.4 oz (67.4 kg)  11/28/12 157 lb 10.1 oz (71.5 kg)   BP Readings from Last 3 Encounters:  12/06/13 142/80  11/23/13 152/54  07/29/13 140/70   BP 142/80  Pulse 77  Temp(Src) 98.2 F (36.8 C) (Oral)  Resp 12  SpO2 95%  Past Medical History  Diagnosis Date  . Hyperlipidemia   . Hypothyroid     hypo  . CHF (congestive heart failure)   . Mitral regurgitation   . Tricuspid regurgitation   . Anemia   . Arthritis     Osteoarthtistis  . Elevated glucose   . CVA (cerebral infarction)   . Vitamin D deficiency   . Myocardial infarction   . Chronic kidney failure     stage 3  . CAD (coronary artery disease)     sees Dr. Ricka BurdockNishan,saw last 03/2011  . Hypertension     Dr. Eden EmmsNishan cardiologist, Dr. Cheron SchaumannPlotnikoff primary doctor   Past Surgical History  Procedure Laterality Date  . Abdominal hysterectomy    . Back surgery    . Coronary stent placement    . Laparotomy  06/23/2011    Procedure: EXPLORATORY LAPAROTOMY;  Surgeon: Barnett AbuHenry Elsner, MD;  Location: [facility];  Service: Neurosurgery;  Laterality: Bilateral;  . Esophagogastroduodenoscopy N/A 04/28/2012    Procedure: ESOPHAGOGASTRODUODENOSCOPY (EGD);  Surgeon: Hart Carwinora M Brodie, MD;  [facility];  Service: Endoscopy;  Laterality: N/A;    reports that she has  never smoked. She has never used smokeless tobacco. She reports that she does not drink alcohol or use illicit drugs. family history includes Aneurysm in her mother; Heart attack in her father; Heart disease in her mother; Hypertension in her father. There is no history of Anesthesia problems, Colon cancer, or Esophageal cancer. Allergies  Allergen Reactions  . Ace Inhibitors     unknown  . Aricept [Donepezil Hcl]     Sick   . Ezetimibe-Simvastatin     REACTION: Myalgia  . Methyldopa     tired  . Rosuvastatin     REACTION: aches  . Penicillins Rash    Current Outpatient Prescriptions on File Prior to Visit  Medication Sig Dispense Refill  . amLODipine (NORVASC) 5 MG tablet Take 1 tablet (5 mg total) by mouth daily.  30 tablet  0  . aspirin EC 81 MG EC tablet Take 1 tablet (81 mg total) by mouth daily.  30 tablet  0  . cholecalciferol (VITAMIN D) 1000 UNITS tablet Take 1,000 Units by mouth daily.      . clopidogrel (PLAVIX) 75 MG tablet Take 1 tablet (75 mg total) by mouth daily.  90 tablet  3  . FLUoxetine (PROZAC) 10 MG tablet Take 1 tablet (10 mg total) by mouth daily.  90 tablet  3  .  levothyroxine (SYNTHROID, LEVOTHROID) 75 MCG tablet Take 1 tablet (75 mcg total) by mouth daily.  90 tablet  1  . meclizine (ANTIVERT) 25 MG tablet Take 25 mg by mouth 3 (three) times daily as needed for dizziness (inner ear & dizziness).      . metoprolol (LOPRESSOR) 100 MG tablet Take 1.5 tablets (150 mg total) by mouth 2 (two) times daily.  270 tablet  3  . omeprazole (PRILOSEC) 20 MG capsule Take 1 capsule (20 mg total) by mouth daily.  90 capsule  3  . pravastatin (PRAVACHOL) 20 MG tablet Take 1 tablet (20 mg total) by mouth daily.  30 tablet  5  . vitamin B-12 (CYANOCOBALAMIN) 100 MCG tablet Take 100 mcg by mouth daily.       No current facility-administered medications on file prior to visit.      Review of Systems  Constitutional: Positive for fatigue. Negative for chills, activity  change, appetite change and unexpected weight change.  HENT: Negative for congestion, mouth sores and sinus pressure.   Eyes: Negative for visual disturbance.  Respiratory: Negative for cough and chest tightness.   Genitourinary: Negative for frequency, difficulty urinating and vaginal pain.  Musculoskeletal: Positive for gait problem (foot bain is better).  Skin: Negative for pallor and rash.  Neurological: Negative for dizziness and tremors.  Psychiatric/Behavioral: Positive for confusion and decreased concentration. Negative for suicidal ideas, sleep disturbance and self-injury.       Objective:   Physical Exam  Constitutional: She appears well-developed and well-nourished. No distress.  w/c  HENT:  Head: Normocephalic.  Right Ear: External ear normal.  Left Ear: External ear normal.  Nose: Nose normal.  Mouth/Throat: Oropharynx is clear and moist. No oropharyngeal exudate.  Mouth is moist  Eyes: Conjunctivae are normal. Pupils are equal, round, and reactive to light. Right eye exhibits no discharge. Left eye exhibits no discharge.  Neck: Normal range of motion. Neck supple. No JVD present. No tracheal deviation present. No thyromegaly present.  Cardiovascular: Normal rate, regular rhythm and normal heart sounds.   Pulmonary/Chest: No stridor. No respiratory distress. She has no wheezes.  Abdominal: Soft. Bowel sounds are normal. She exhibits no distension and no mass. There is no tenderness. There is no rebound and no guarding.  Musculoskeletal: She exhibits edema (1+ B) and tenderness (R ankle is tender w/ROM a little).  Lymphadenopathy:    She has no cervical adenopathy.  Neurological: She displays normal reflexes. A cranial nerve deficit is present. She exhibits normal muscle tone. Coordination abnormal.   Unable to get up off/out the w/chair No pron drift B No focal sx's Alert, cooperative  Skin: Skin is warm and dry. No rash noted. No erythema.  Psychiatric: She has a  normal mood and affect. Her behavior is normal.  No tremor. Rare blinking. Mask like facial expression In a w/c  Lab Results  Component Value Date   WBC 9.0 11/23/2013   HGB 12.5 11/23/2013   HCT 37.4 11/23/2013   PLT 320 11/23/2013   GLUCOSE 95 11/23/2013   CHOL 203* 05/17/2012   TRIG 226.0* 05/17/2012   HDL 43.40 05/17/2012   LDLDIRECT 114.7 05/17/2012   LDLCALC 93 12/08/2011   ALT 10 02/26/2013   AST 22 02/26/2013   NA 138 11/23/2013   K 4.6 11/23/2013   CL 101 11/23/2013   CREATININE 1.36* 11/23/2013   BUN 23 11/23/2013   CO2 23 11/23/2013   TSH 0.29* 07/29/2013   INR 1.29 06/23/2011   HGBA1C  5.7* 07/01/2011   MICROALBUR 1.3 08/17/2006    A complex case   Assessment & Plan:

## 2013-12-06 NOTE — Assessment & Plan Note (Signed)
Ceftin x 1 week

## 2013-12-06 NOTE — Assessment & Plan Note (Addendum)
Recurrent [facility] for PT Continue with current prescription therapy as reflected on the Med list.

## 2013-12-06 NOTE — Assessment & Plan Note (Signed)
Worse Continue with current prescription therapy as reflected on the Med list. [facility] - PT

## 2013-12-06 NOTE — Assessment & Plan Note (Signed)
12/2013 - ataxic, weak legs

## 2013-12-07 ENCOUNTER — Encounter: Payer: Self-pay | Admitting: Internal Medicine

## 2013-12-07 ENCOUNTER — Telehealth: Payer: Self-pay | Admitting: Internal Medicine

## 2013-12-07 DIAGNOSIS — N189 Chronic kidney disease, unspecified: Secondary | ICD-10-CM | POA: Insufficient documentation

## 2013-12-07 NOTE — Assessment & Plan Note (Signed)
Needs a better hydration Poor po intake 

## 2013-12-07 NOTE — Telephone Encounter (Signed)
emmi mailed  °

## 2013-12-07 NOTE — Assessment & Plan Note (Addendum)
12/2013 - likely a new CVA related May consider a trial of Car/Levadopa empirically PT

## 2013-12-07 NOTE — Assessment & Plan Note (Signed)
Needs a better hydration Poor po intake

## 2013-12-07 NOTE — Assessment & Plan Note (Signed)
Continue with current prescription therapy as reflected on the Med list.  

## 2013-12-09 ENCOUNTER — Other Ambulatory Visit: Payer: Self-pay | Admitting: *Deleted

## 2013-12-09 DIAGNOSIS — Z23 Encounter for immunization: Secondary | ICD-10-CM

## 2013-12-09 LAB — TB SKIN TEST
Induration: 0 mm
TB Skin Test: NEGATIVE

## 2014-01-24 ENCOUNTER — Telehealth: Payer: Self-pay | Admitting: *Deleted

## 2014-01-24 MED ORDER — LORAZEPAM 2 MG/ML PO CONC: 0.5000 mg | Freq: Three times a day (TID) | ORAL | Status: AC | PRN

## 2014-01-24 NOTE — Telephone Encounter (Signed)
Ok - pls call in - see Rx Thx

## 2014-01-24 NOTE — Telephone Encounter (Signed)
Pt was d/c to home today. They will be starting skilled Nursing and speech therapy. She is having a hard time swallowing her lorazepam 0.5 mg tablets. They are requesting a new Rx for liquid form sent to [facility]. Please advise.

## 2014-01-25 NOTE — Telephone Encounter (Signed)
Done. Left detailed mess informing pt's spouse.

## 2014-02-17 DIAGNOSIS — I69393 Ataxia following cerebral infarction: Secondary | ICD-10-CM | POA: Diagnosis not present

## 2014-02-17 DIAGNOSIS — F0390 Unspecified dementia without behavioral disturbance: Secondary | ICD-10-CM | POA: Diagnosis not present

## 2014-02-17 DIAGNOSIS — G309 Alzheimer's disease, unspecified: Secondary | ICD-10-CM | POA: Diagnosis not present

## 2014-02-17 DIAGNOSIS — I129 Hypertensive chronic kidney disease with stage 1 through stage 4 chronic kidney disease, or unspecified chronic kidney disease: Secondary | ICD-10-CM

## 2014-02-17 DIAGNOSIS — G219 Secondary parkinsonism, unspecified: Secondary | ICD-10-CM | POA: Diagnosis not present

## 2014-02-17 DIAGNOSIS — I2510 Atherosclerotic heart disease of native coronary artery without angina pectoris: Secondary | ICD-10-CM

## 2014-03-03 DEATH — deceased
